# Patient Record
Sex: Male | Born: 1970 | Race: White | Hispanic: No | Smoking: Former smoker
Health system: Southern US, Community
[De-identification: ages and names within clinical notes are randomized; demographics above are authoritative.]

## PROBLEM LIST (undated history)

## (undated) DIAGNOSIS — K219 Gastro-esophageal reflux disease without esophagitis: Secondary | ICD-10-CM

## (undated) DIAGNOSIS — C801 Malignant (primary) neoplasm, unspecified: Secondary | ICD-10-CM

## (undated) DIAGNOSIS — Z978 Presence of other specified devices: Secondary | ICD-10-CM

## (undated) DIAGNOSIS — L0291 Cutaneous abscess, unspecified: Secondary | ICD-10-CM

## (undated) HISTORY — PX: ABDOMINAL SURGERY: SHX537

## (undated) HISTORY — PX: OTHER SURGICAL HISTORY: SHX169

## (undated) HISTORY — DX: Gastro-esophageal reflux disease without esophagitis: K21.9

## (undated) HISTORY — PX: JEJUNOSTOMY FEEDING TUBE: SUR737

## (undated) HISTORY — PX: TRACHEOESOPHAGEAL FISTULA REPAIR: SHX2557

---

## 2002-02-27 ENCOUNTER — Ambulatory Visit (HOSPITAL_COMMUNITY): Admission: RE | Admit: 2002-02-27 | Discharge: 2002-02-28 | Payer: Self-pay

## 2004-09-20 ENCOUNTER — Emergency Department (HOSPITAL_COMMUNITY): Admission: EM | Admit: 2004-09-20 | Discharge: 2004-09-20 | Payer: Self-pay | Admitting: Emergency Medicine

## 2004-09-29 ENCOUNTER — Emergency Department (HOSPITAL_COMMUNITY): Admission: EM | Admit: 2004-09-29 | Discharge: 2004-09-29 | Payer: Self-pay | Admitting: Emergency Medicine

## 2004-09-30 ENCOUNTER — Emergency Department (HOSPITAL_COMMUNITY): Admission: EM | Admit: 2004-09-30 | Discharge: 2004-09-30 | Payer: Self-pay | Admitting: Emergency Medicine

## 2005-09-30 ENCOUNTER — Ambulatory Visit: Payer: Self-pay | Admitting: Internal Medicine

## 2005-10-05 ENCOUNTER — Ambulatory Visit (HOSPITAL_COMMUNITY): Admission: RE | Admit: 2005-10-05 | Discharge: 2005-10-05 | Payer: Self-pay | Admitting: Internal Medicine

## 2005-10-12 ENCOUNTER — Ambulatory Visit (HOSPITAL_COMMUNITY): Admission: RE | Admit: 2005-10-12 | Discharge: 2005-10-12 | Payer: Self-pay | Admitting: Internal Medicine

## 2005-11-04 ENCOUNTER — Ambulatory Visit: Payer: Self-pay | Admitting: Internal Medicine

## 2005-11-05 ENCOUNTER — Ambulatory Visit (HOSPITAL_COMMUNITY): Admission: RE | Admit: 2005-11-05 | Discharge: 2005-11-05 | Payer: Self-pay | Admitting: Internal Medicine

## 2005-11-19 ENCOUNTER — Emergency Department (HOSPITAL_COMMUNITY): Admission: EM | Admit: 2005-11-19 | Discharge: 2005-11-20 | Payer: Self-pay | Admitting: Emergency Medicine

## 2005-12-02 ENCOUNTER — Ambulatory Visit (HOSPITAL_COMMUNITY): Admission: RE | Admit: 2005-12-02 | Discharge: 2005-12-02 | Payer: Self-pay | Admitting: Internal Medicine

## 2005-12-31 IMAGING — US US ABDOMEN COMPLETE
1 series · 13 of 25 positions shown · non-contrast
Comparison: none

CLINICAL DATA: Hepatitis.
 ABDOMEN ULTRASOUND ? 10/05/05:
TECHNIQUE: Complete abdominal ultrasound examination was performed including evaluation of the liver, gallbladder, bile ducts, pancreas, kidneys, spleen, IVC, and abdominal aorta.

[Series 1: unknown · 0.33mm/px · 13 of 75 slices shown]
[im 1/75]
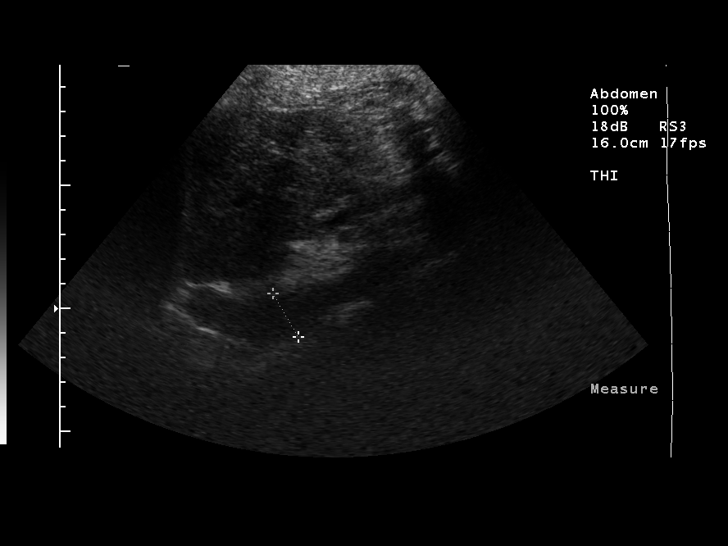
[im 7/75]
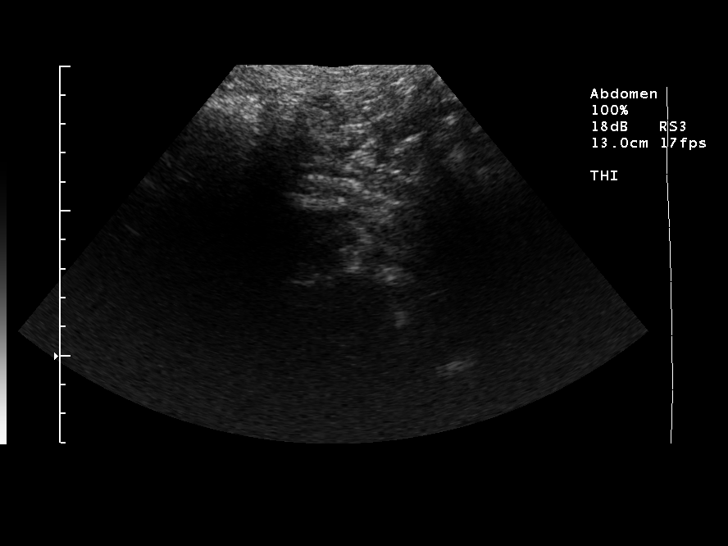
[im 13/75]
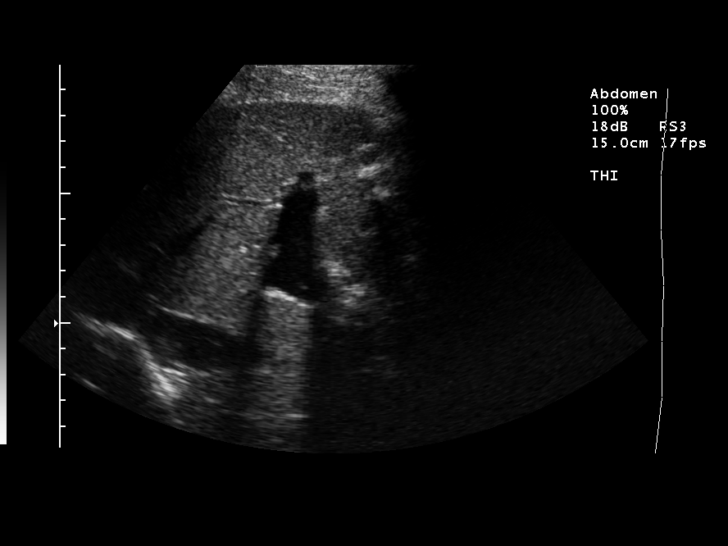
[im 19/75]
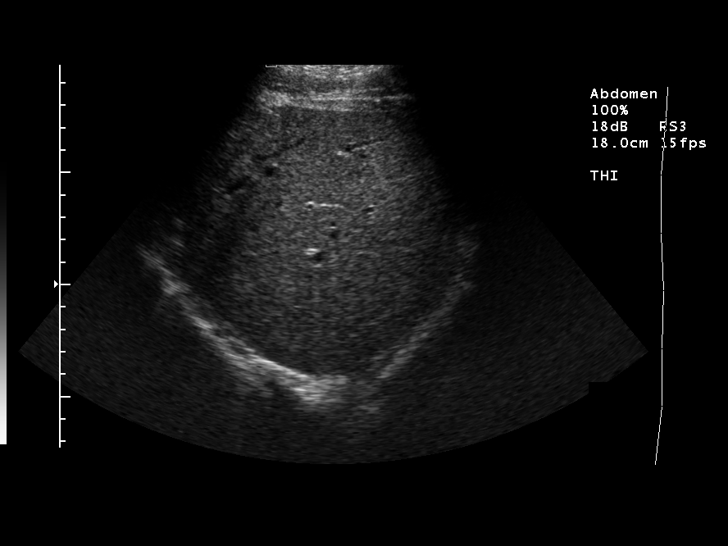
[im 25/75]
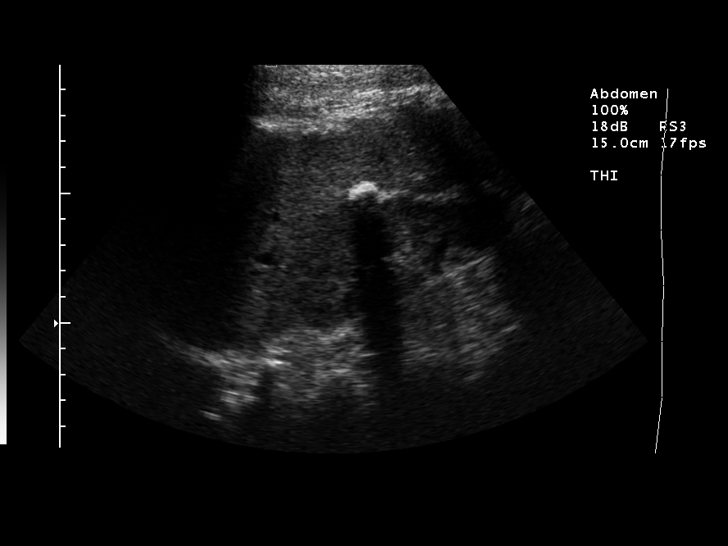
[im 31/75]
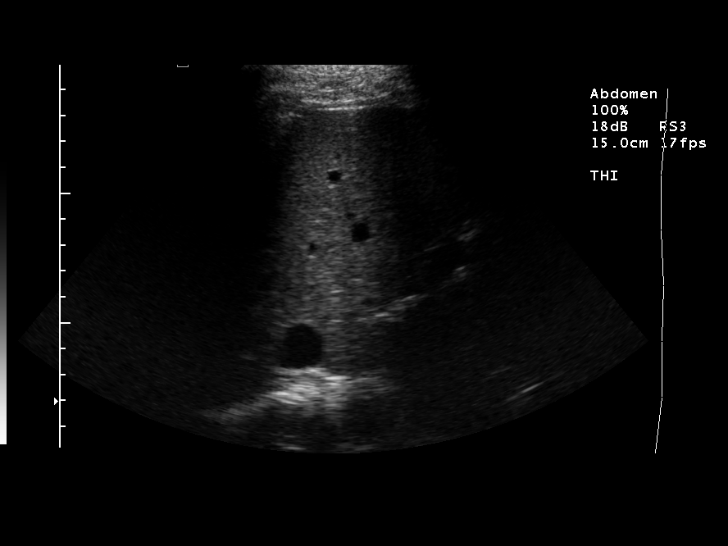
[im 38/75]
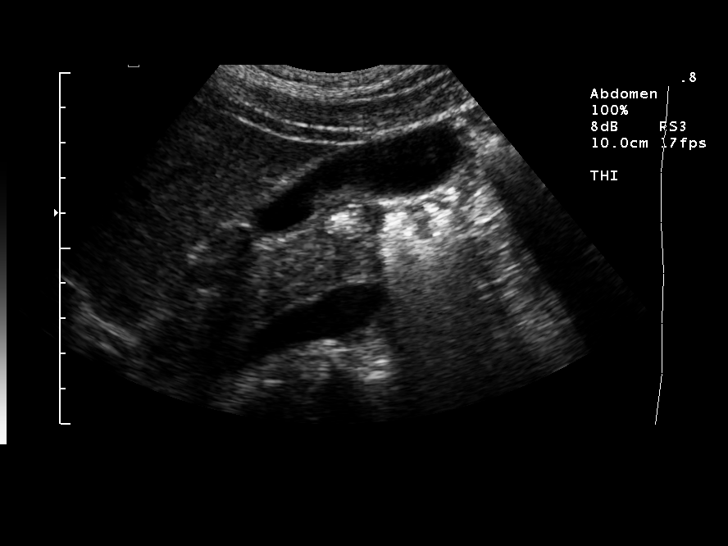
[im 44/75]
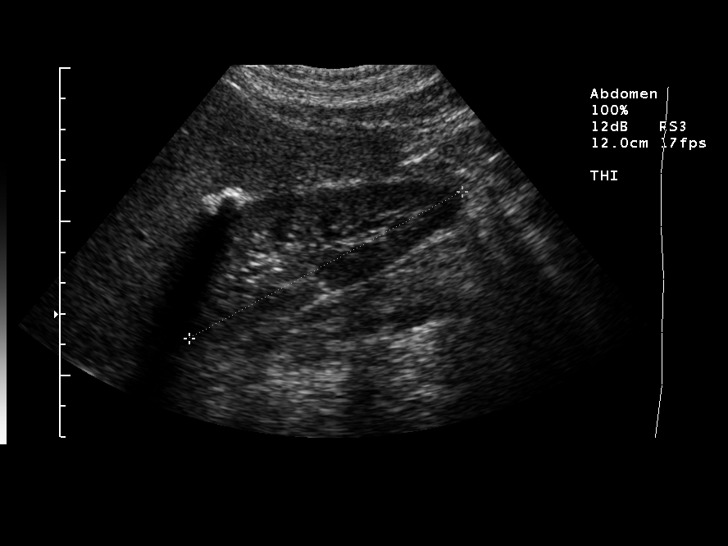
[im 50/75]
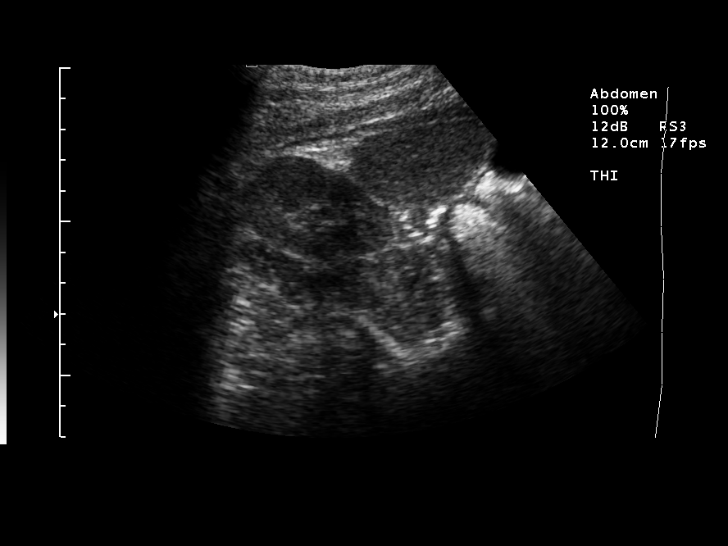
[im 56/75]
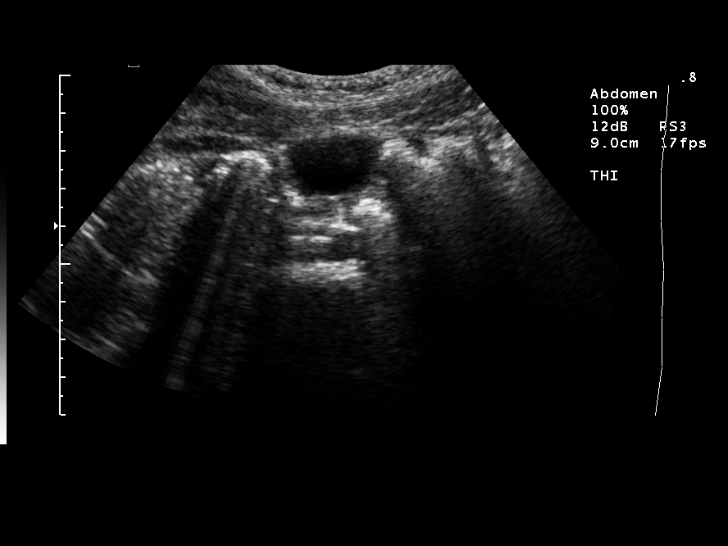
[im 62/75]
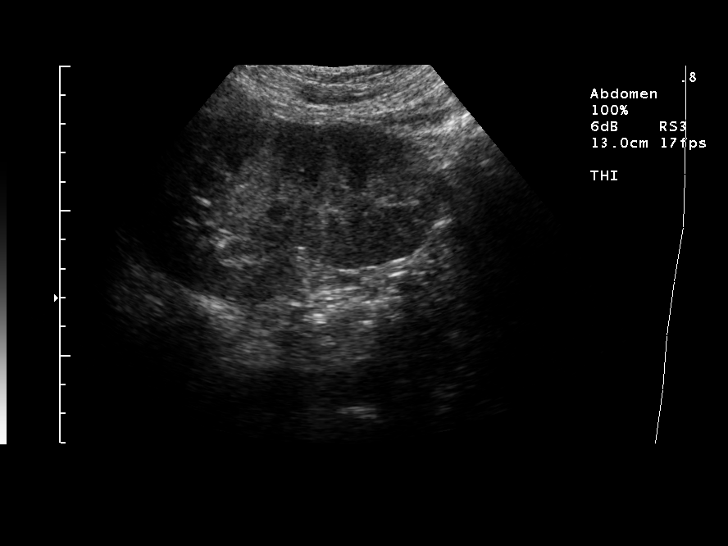
[im 68/75]
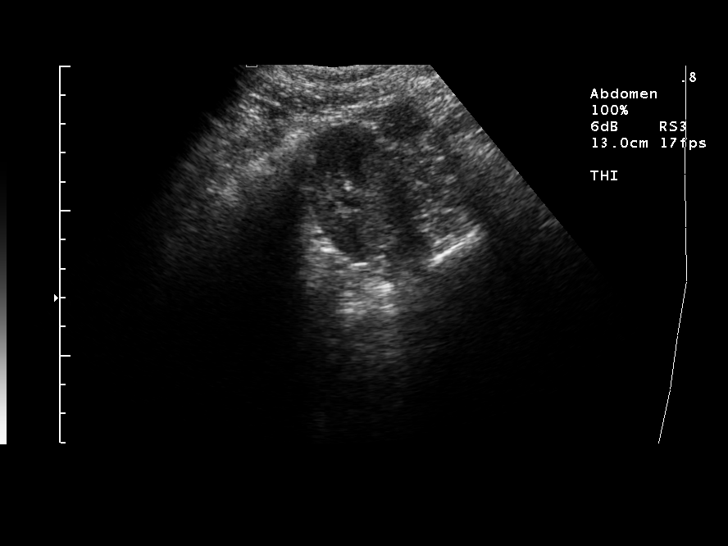
[im 75/75]
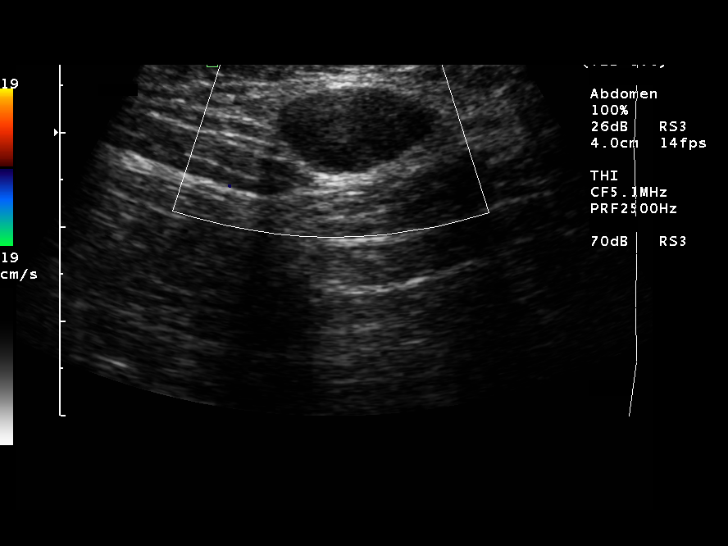

[13 of 25 positions shown; findings below may reference images not displayed]

FINDINGS: The liver is homogeneous in echotexture without intra- or extrahepatic ductal dilatation.  There is an approximately 1.4 cm area of shadowing echogenicity in the right hepatic lobe inferiorly which appears to abut the right kidney.  No intra- or extrahepatic ductal dilatation is seen.  The right kidney measures 10 cm and the left kidney measures 10.7 cm.  No hydronephrosis or focal mass is seen.  The spleen and gallbladder are unremarkable.  The pancreas is not well visualized due to overlying bowel gas.  The intrahepatic IVC and abdominal aorta are normal.  No ascites is seen.  At the time of the examination, the patient indicated a palpable area over the posterior left flank.  In the area of the questioned palpable finding is a 1.9 x 1.4 x 1.0 cm oval hypoechoic mass without internal flow, located approximately 0.5 cm from the skin surface.
IMPRESSION: 1.  Right hepatic lobe calcification, incompletely evaluated on this modality.  Liver mass protocol CT before and after administration of IV contrast is recommended for further characterization.  
 2.  1.9 x 1.4 x 1.0 cm hypoechoic oval mass located 0.5 cm from the skin surface over the posterior left flank, as indicated by the patient as a palpable mass.  This may represent a sebaceous cyst or other benign etiology.  This area could also be included on abdominal CT and be further characterized at that time.
 Recommendations will be called to Dr. Rebecca?[REDACTED] by Radiology staff at the conclusion of this dictation.

## 2006-01-07 IMAGING — CT CT ABDOMEN WO/W CM
3 of 6 series · 11 of 42 positions shown, 17 images · IV contrast (CONTRAST)
Comparison: none

CLINICAL DATA: Hepatomegaly.  Hepatitis.  Liver mass protocol due to calcification lesion seen on recent abdominal ultrasound.  
ABDOMEN CT WITHOUT AND WITH CONTRAST:
TECHNIQUE: Multidetector CT imaging of the abdomen was performed both before and during bolus administration of intravenous contrast.
Contrast:   cc Omnipaque 300

[Series 4342: — · axial · 0.66mm/px · 1 of 49 slices shown (1 of 3)]
[im 9/49  soft-tissue]
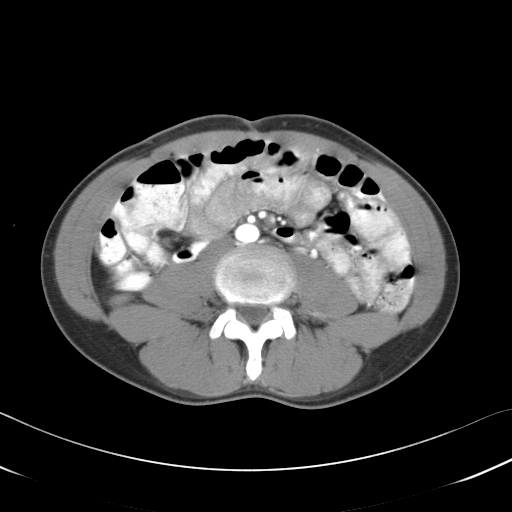

[Series 4343: — · axial · 0.68mm/px · z∈[+1328,+1568]mm · 7 of 65 slices shown, 12 images (2 of 3)]
[im 9/65  soft-tissue]
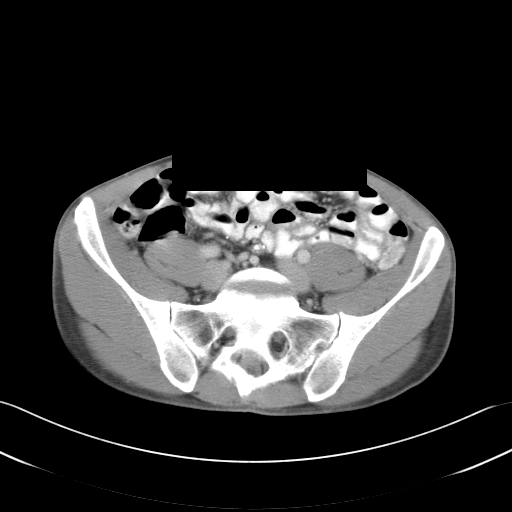
[im 9/65  bone]
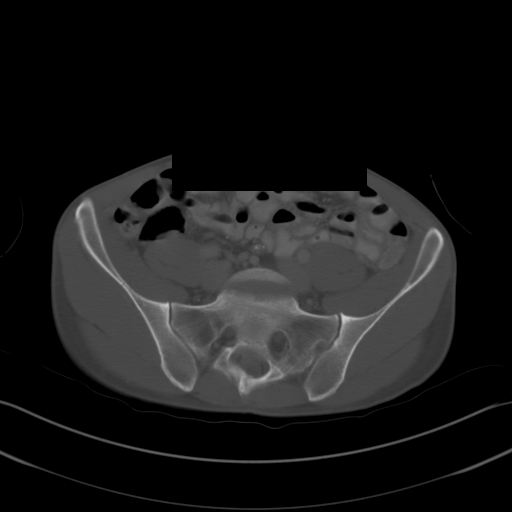
[im 17/65  soft-tissue]
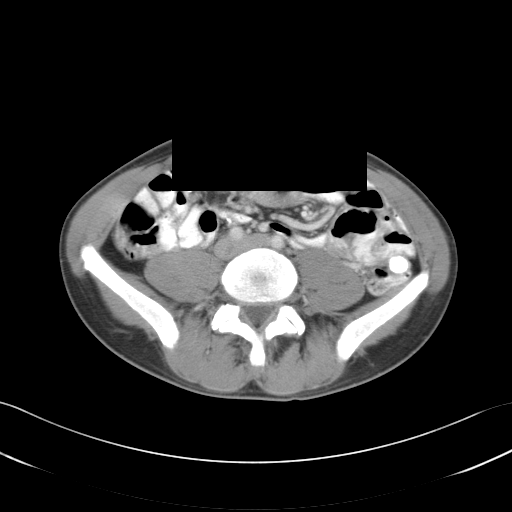
[im 25/65  soft-tissue]
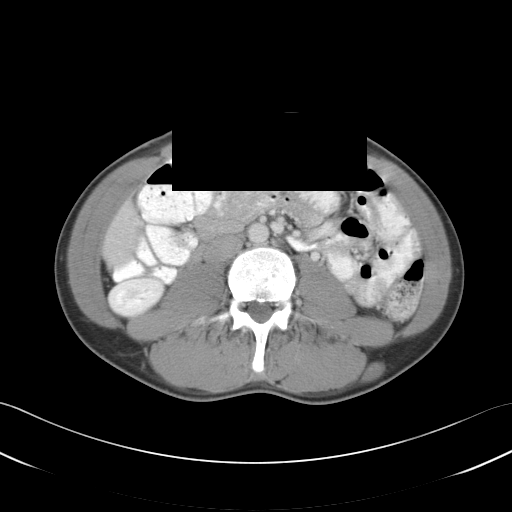
[im 33/65  soft-tissue]
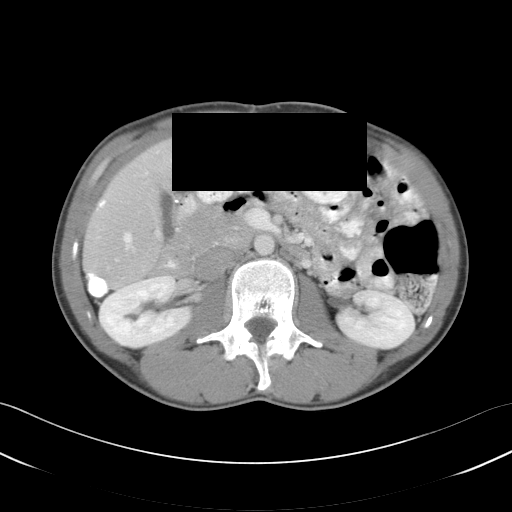
[im 33/65  lung]
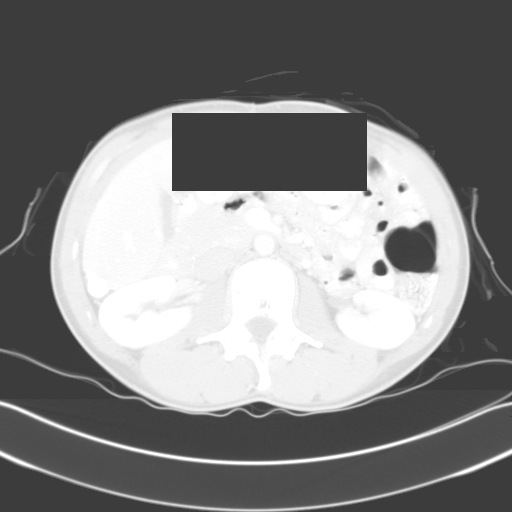
[im 41/65  soft-tissue]
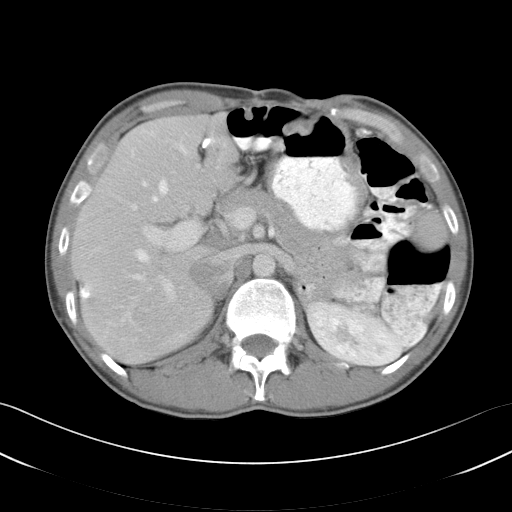
[im 41/65  lung]
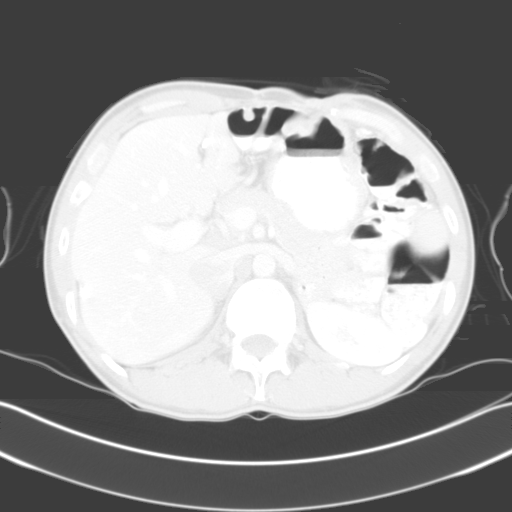
[im 49/65  soft-tissue]
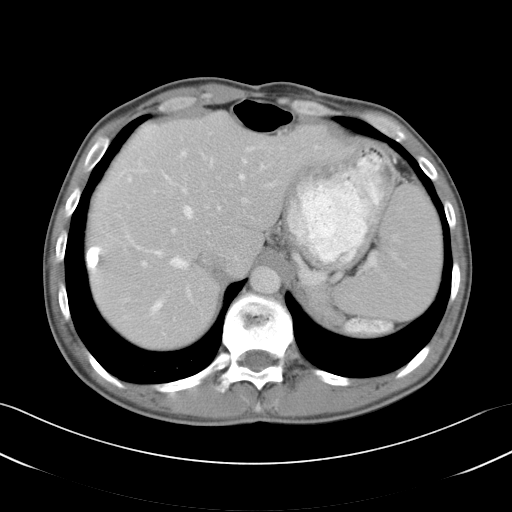
[im 49/65  lung]
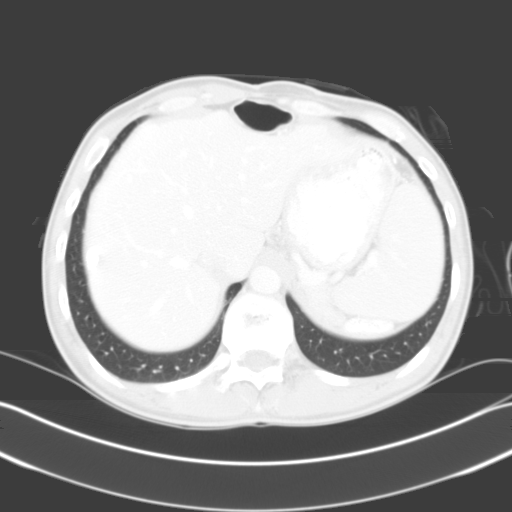
[im 57/65  soft-tissue]
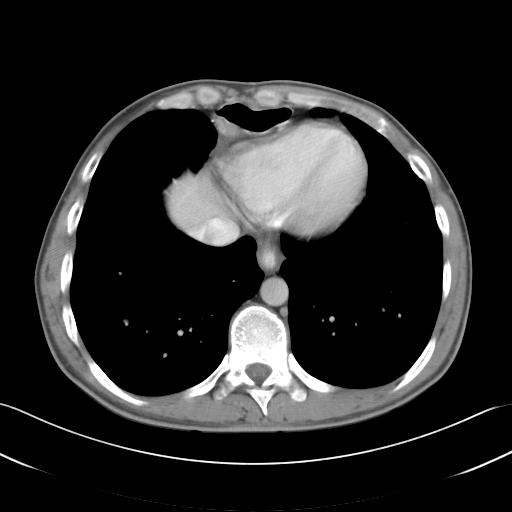
[im 57/65  lung]
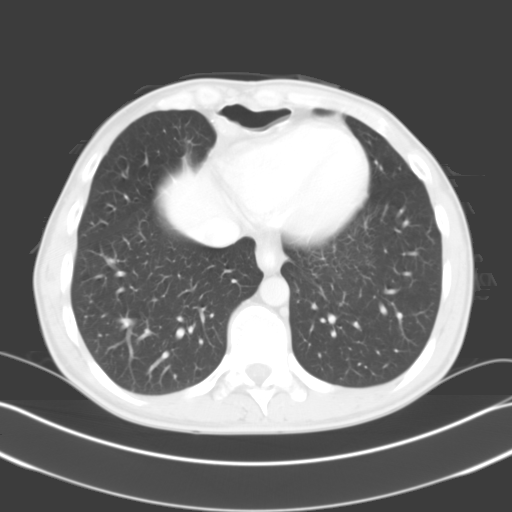

[— · coronal · 0.66mm/px · 3 of 100 slices shown, 4 images (3 of 3)]
[im 34/100  soft-tissue]
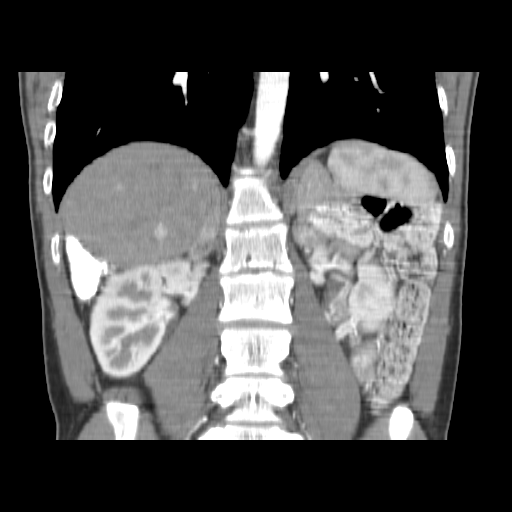
[im 45/100  soft-tissue]
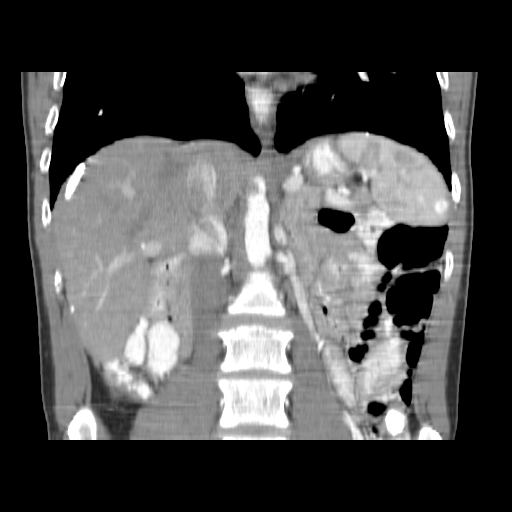
[im 45/100  bone]
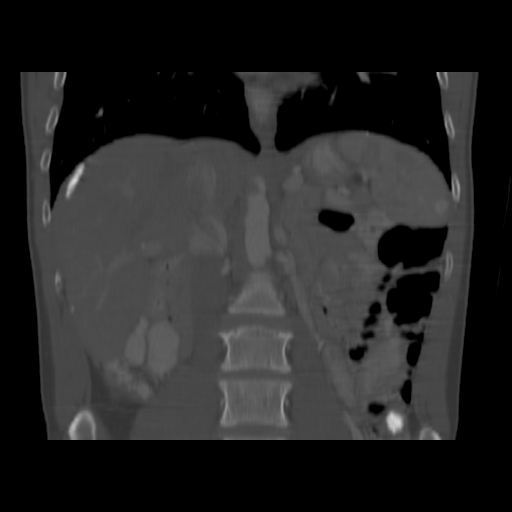
[im 56/100  soft-tissue]
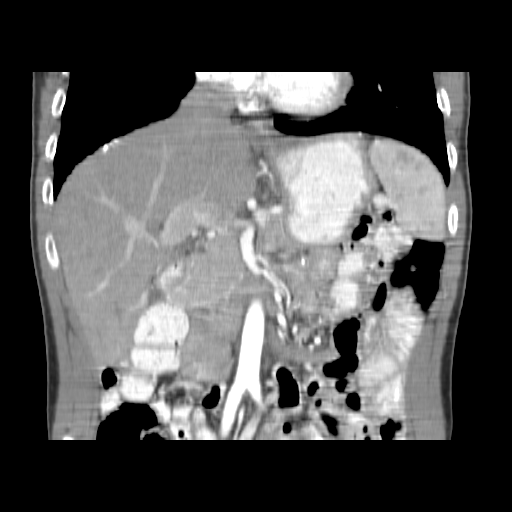

[11 of 42 positions shown; findings below may reference images not displayed]

FINDINGS: An anterior diaphragmatic hernia is present with transverse colon extending up into the anterior mediastinum.  This is only partially characterized on today?s exam, but on the scout view the appearance of the anterior mediastinal colonic hernia is rather prominent.  Dedicated chest CT is may be warranted for complete evaluation. 
There is a small hiatal hernia.  In addition, we demonstrate abnormal markedly dense scattered foci of density ranging up to 0000 Hounsfield units along the edge of the liver and spleen as well as scattered in the mesentery.  These very dense foci along the mesentery are likely outside of the bowel.  
I believe that the dominant capsular lesion along the liver which measures 2.3 X 1.4 cm corresponds to the lesion seen sonographically.  Given the rest of the findings in the abdomen I doubt that this represents an actual liver mass but rather a large dense capsular calcification.  No pancreatic mass is identified.  
Cephalad in the spleen there is a 1 cm enhancing lesion which demonstrates slight hypodensity on precontrast imaging, as well as arterial phase enhancement and mild delayed enhancement.  This may represent an atypical hemangioma but is technically nonspecific.  There is also some faint opacification peripherally and inferiorly in the spleen measuring 9 mm in diameter on image 21 of series 0909, likewise technically nonspecific.  
Both kidneys appear unremarkable.  The technologist scanned down into a part of the pelvic on series 0909 and includes much of the pelvis in this CT of the abdomen, and also demonstrates some calcifications along the right external iliac nodal chain.  On image 41 of series of 0909 we demonstrate a left sided subcutaneous soft tissue lesion measuring 12 mm in short axis at the lateral margin of the paraspinal musculature.  This likely represents a sebaceous cyst or other benign subcutaneous lesion.
IMPRESSION: 1.  Capsular high density foci along the liver and splen along with scattered mesenteric and nodal calcifications in the abdomen.  Top differential diagnostic considerations include prior barium peritonitis, prior TB peritonitis, or prior peritoneal dialysis with associated calcifications.  Similar calcifications were noted on a prior conventional radiograph of the abdomen from 02/27/02, and I doubt that this calcification represent a malignant process.  In females, ovarian carcinoma can cause a similar appearance, but given the patient's male gender this is not a consideration.
2.  There are two nonspecific splenic lesions which may need to be followed. 
3.  There is a large herniation of the transverse colon into the anterior mediastinum. 
4.  Small cystic appearing lesion in the left posterior flank is likely a sebaceous cyst.

## 2006-01-31 IMAGING — CT CT CHEST W/ CM
1 of 2 series · 14 of 32 positions shown, 18 images · IV contrast (CONTRAST)
Comparison: CT abdomen 10/12/05.

CLINICAL DATA: Colonic mediastinum on prior CT scan.  History of esophageal surgery as an infant.
CHEST CT WITH CONTRAST:
TECHNIQUE: Multidetector CT imaging of the chest was performed following the standard protocol during bolus administration of intravenous contrast.
Contrast:  100 cc Omnipaque 300.

[Series 6674: — · axial · 0.67mm/px · z∈[+1457,+1752]mm · 14 of 71 slices shown, 18 images]
[im 6/71  mediastinal]
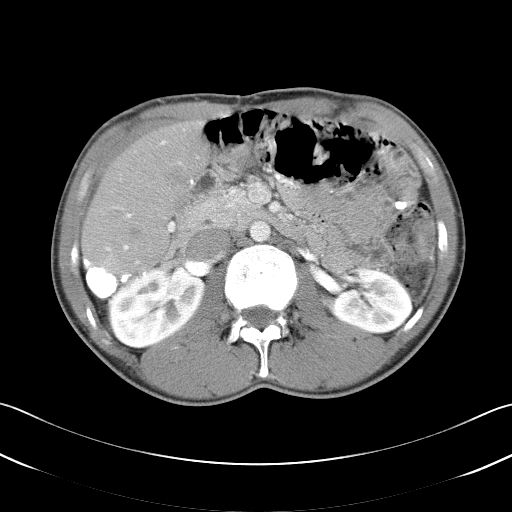
[im 6/71  lung]
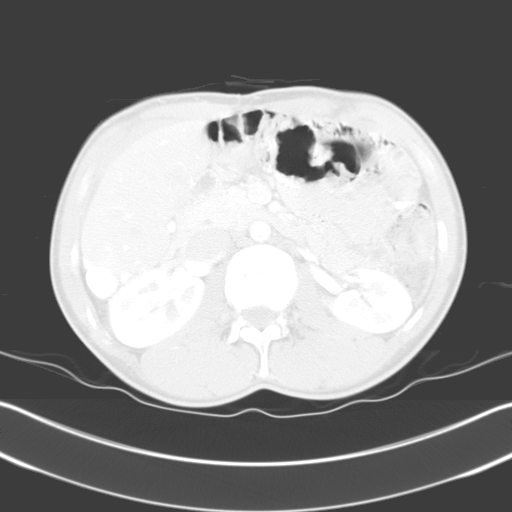
[im 11/71  lung]
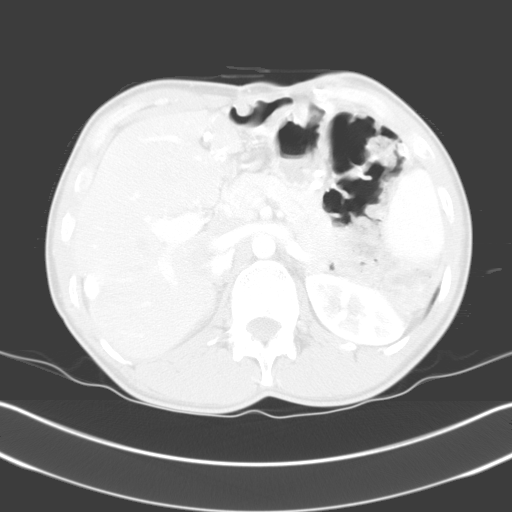
[im 17/71  lung]
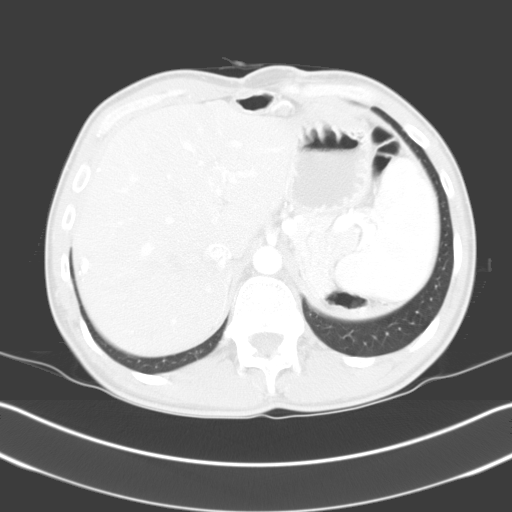
[im 22/71  lung]
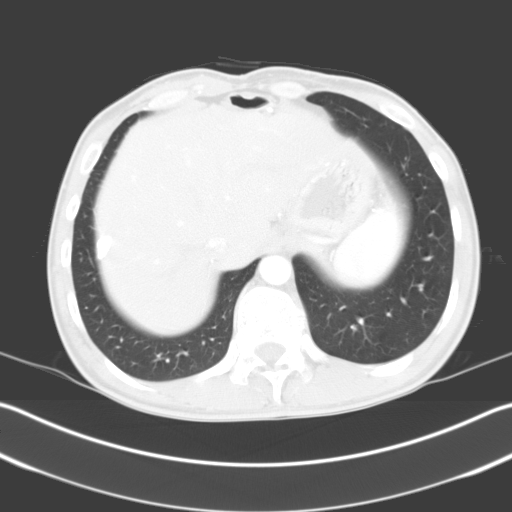
[im 27/71  mediastinal]
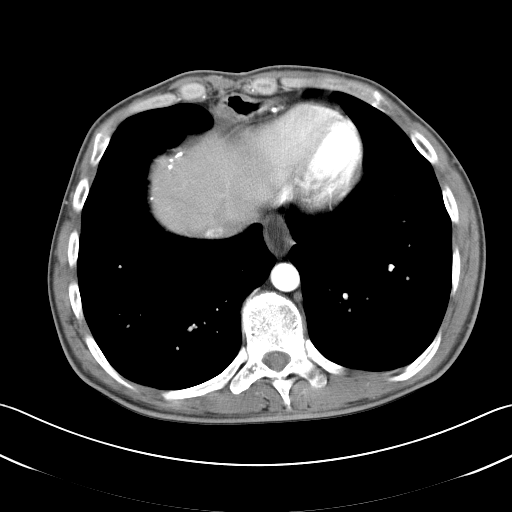
[im 27/71  lung]
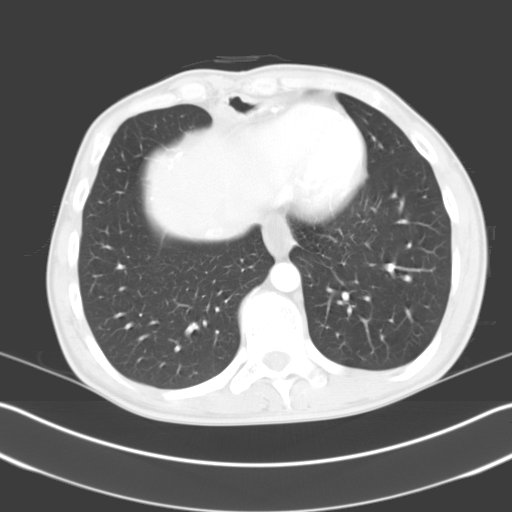
[im 33/71  lung]
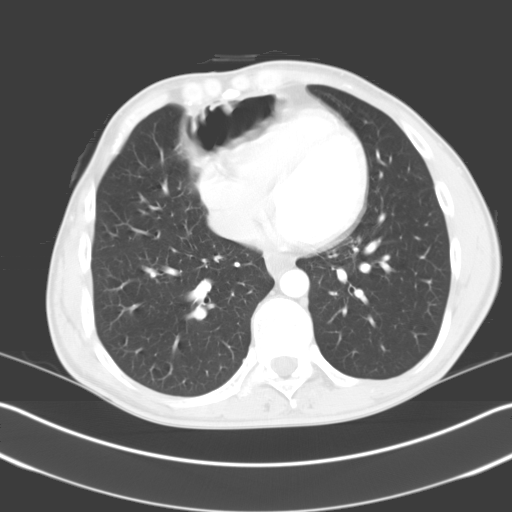
[im 34/71  lung]
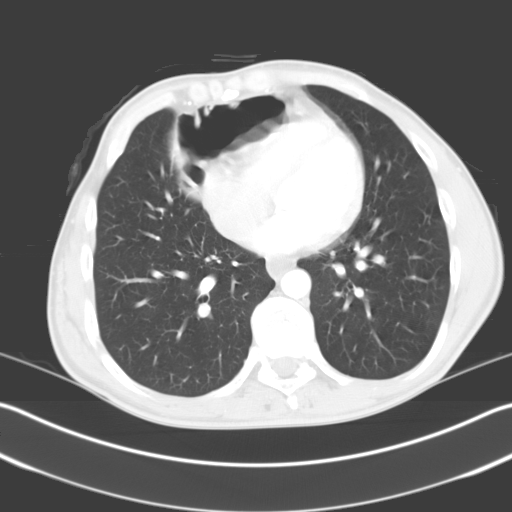
[im 36/71  lung]
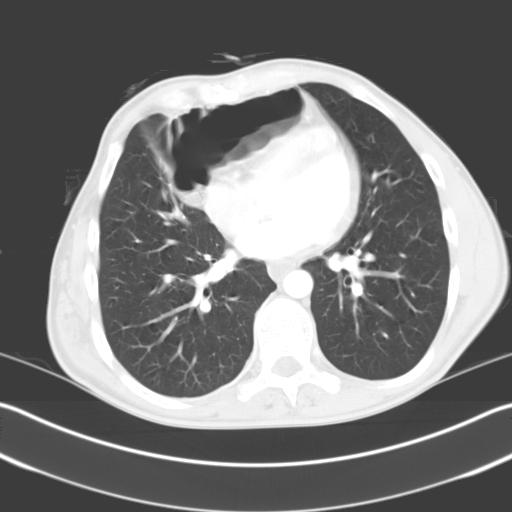
[im 38/71  mediastinal]
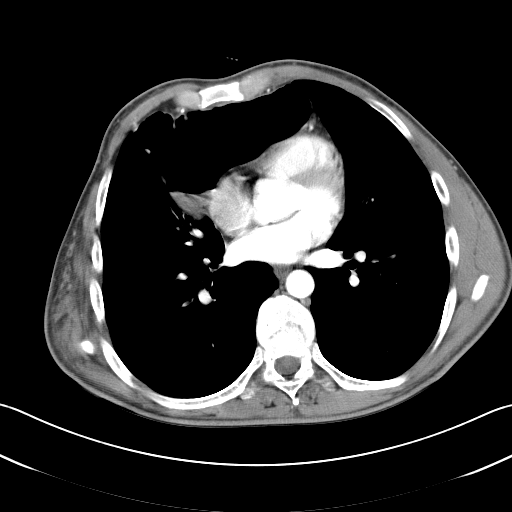
[im 38/71  lung]
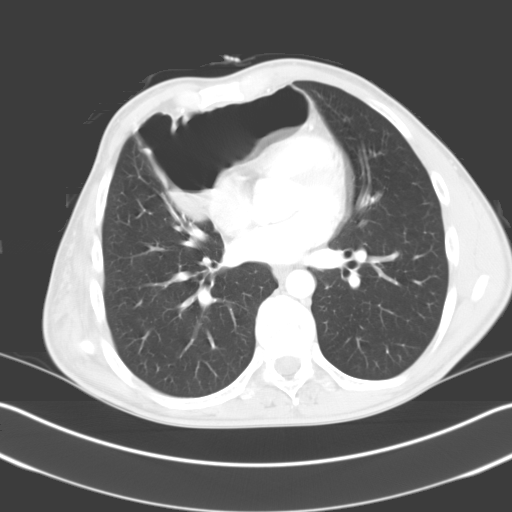
[im 44/71  lung]
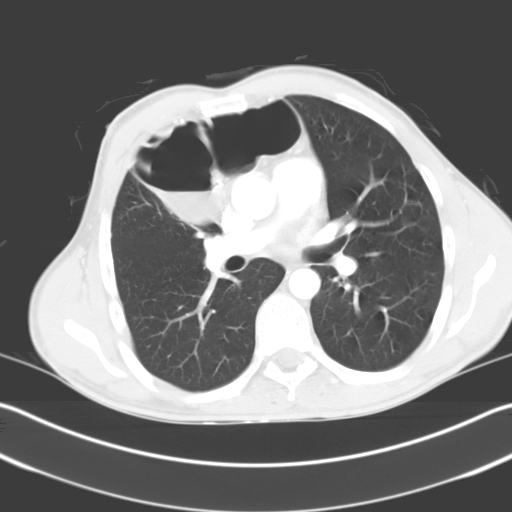
[im 49/71  lung]
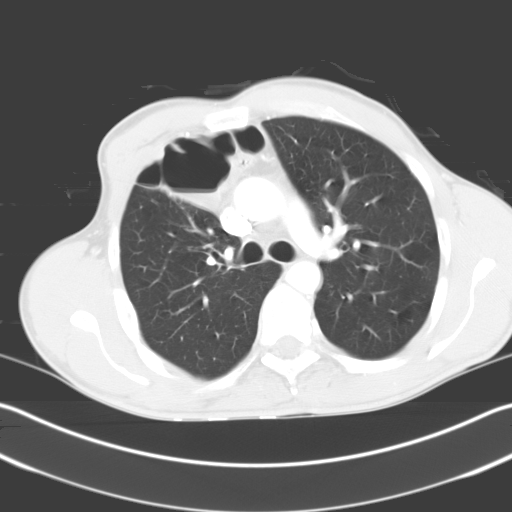
[im 54/71  lung]
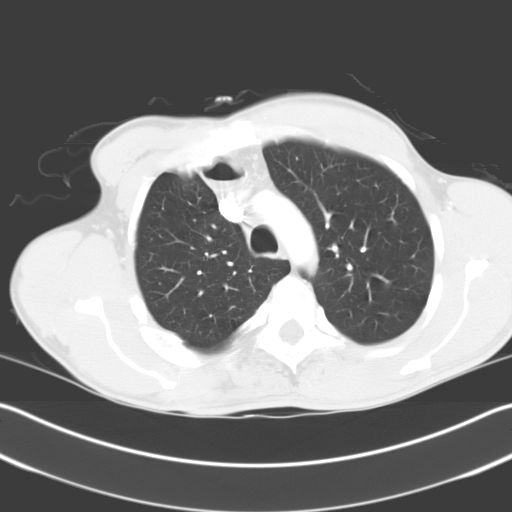
[im 60/71  mediastinal]
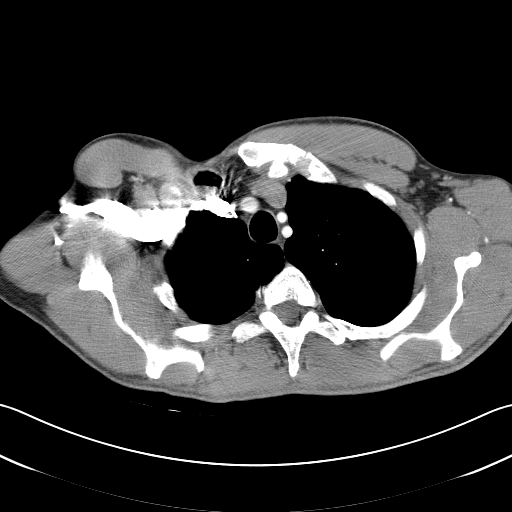
[im 60/71  lung]
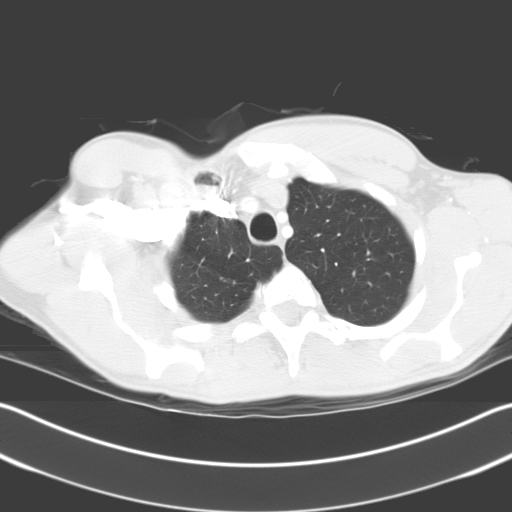
[im 65/71  lung]
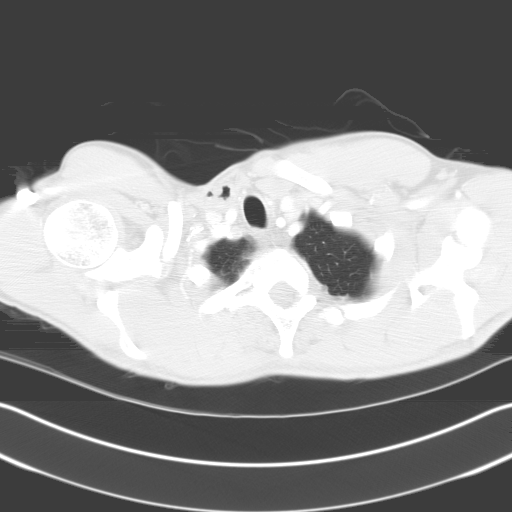

[14 of 32 positions shown; findings below may reference images not displayed]

FINDINGS: There is a colonic segment in the anterior mediastinum extending into the right neck but not appearing to communicate with the esophagus.  It does appear to communicate with the stomach at its distal end.   I think this is most likely a surgically placed colonic segment for esophageal atresia or other esophageal problem as an infant.  No surrounding edema or inflammation is seen.  There is no fluid collection and this does not appear to be inflamed.  There are some fluid levels within the colonic segment, which is redundant.  I see oral contrast in the esophagus on the prior CT and I assume that the patient uses his native esophagus at this point.  I did not give oral contrast on today?s study.  In addition there are some high density peritoneal implants in the abdomen which are presumably barium peritonitis related to extravasation of barium related to patient?s prior esophageal problem.  No barium is seen in the chest cavity.  There is no infiltrate or effusion.  No lung mass is seen and there is no adenopathy.  There are hypervascular splenic lesions measuring about 12 mm each as noted on the prior study.
IMPRESSION: There is a colonic segment in the anterior mediastinum, which appears to end in a blind pouch near the right thyroid gland.  This is presumably a surgically placed colonic segment for esophageal atresia as an infant.  There is some barium peritonitis, which is likely, chronic.  The chest is clear and there is no acute abnormality identified.

## 2006-02-27 IMAGING — RF DG ESOPHAGUS
12 of 17 series · 15 of 24 positions shown · non-contrast
Comparison: none

CLINICAL DATA: 34-year-old male with history of esophageal atresia at birth with colonic interposition.  Progressive dysphagia. 
DOUBLE-CONTRAST ESOPHAGRAM:

[Series 1: run · 1 of 3 slices shown (1 of 12)]
[im 1/3]
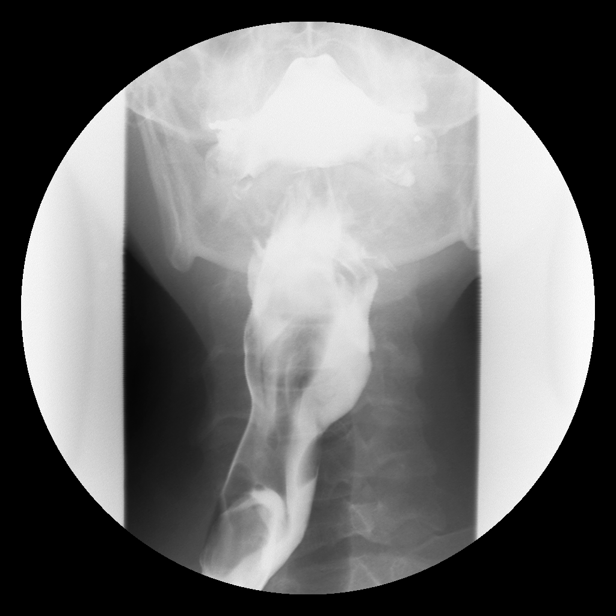

[Series 3: run · 3 of 13 slices shown (2 of 12)]
[im 4/13]
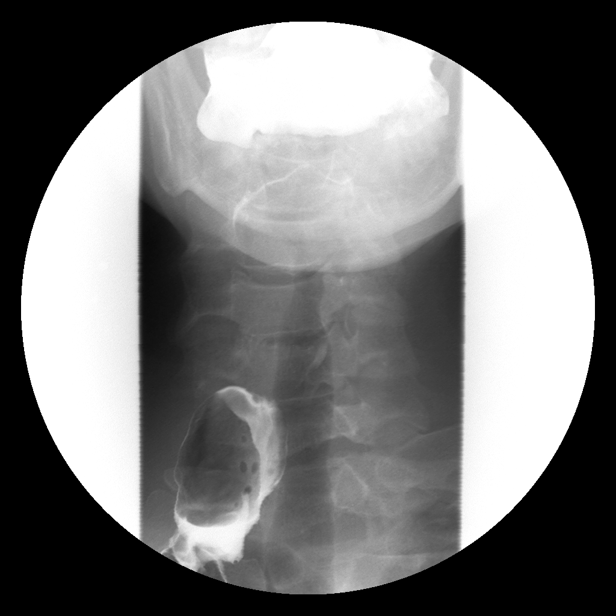
[im 10/13]
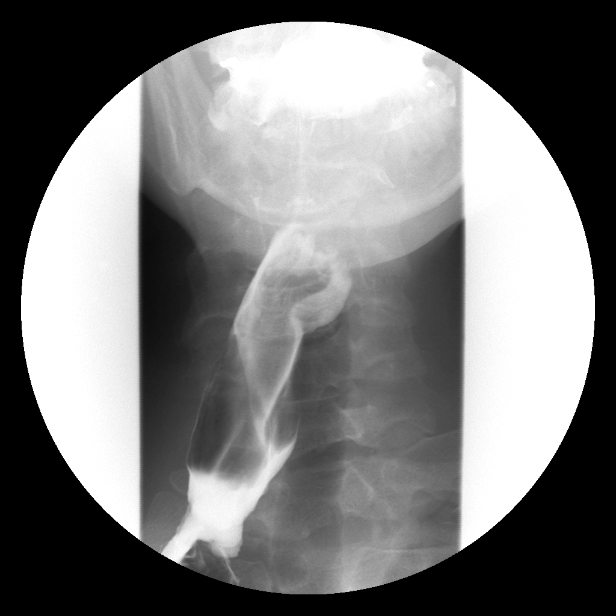
[im 13/13]
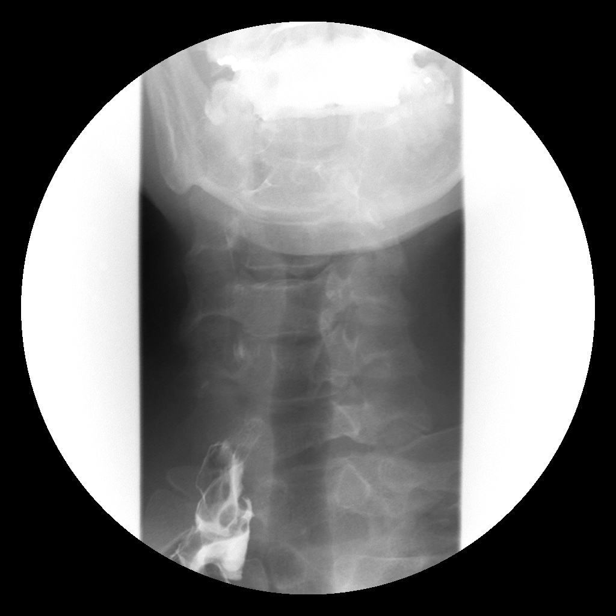

[Series 4: run · 2 of 10 slices shown (3 of 12)]
[im 4/10]
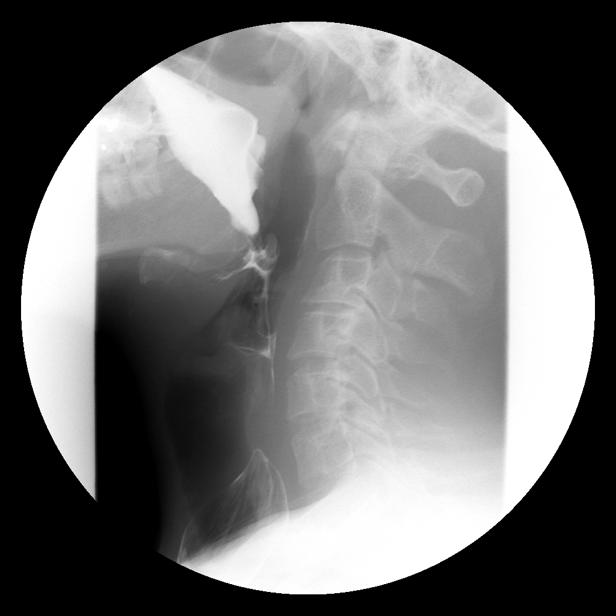
[im 7/10]
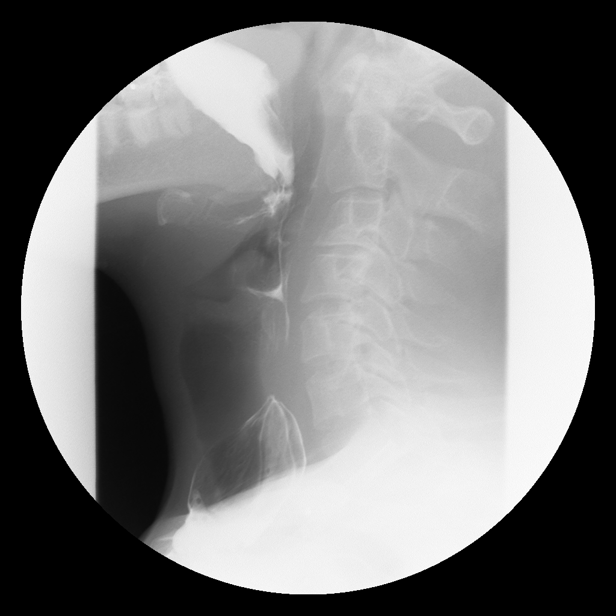

[Series 5: run · 1 of 1 slices shown (4 of 12)]
[im 1/1]
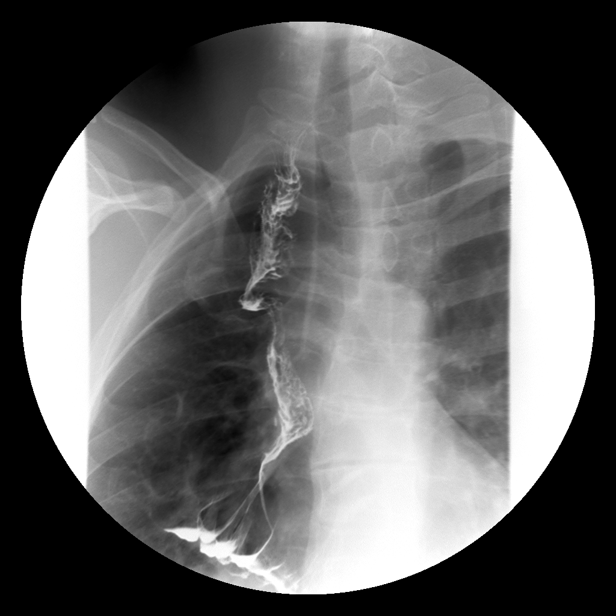

[Series 8: run · 1 of 1 slices shown (5 of 12)]
[im 1/1]
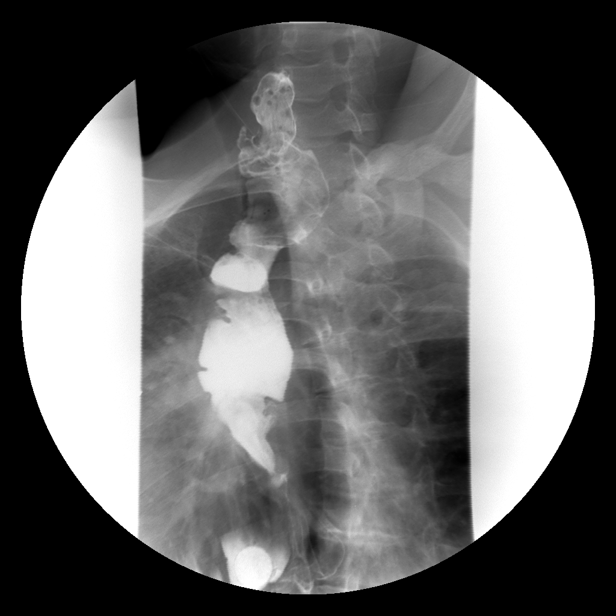

[Series 9: run · 1 of 1 slices shown (6 of 12)]
[im 1/1]
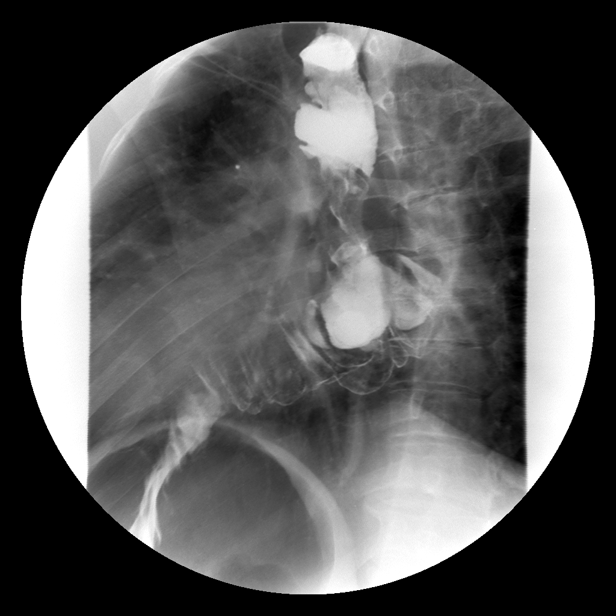

[Series 11: run · 1 of 1 slices shown (7 of 12)]
[im 1/1]
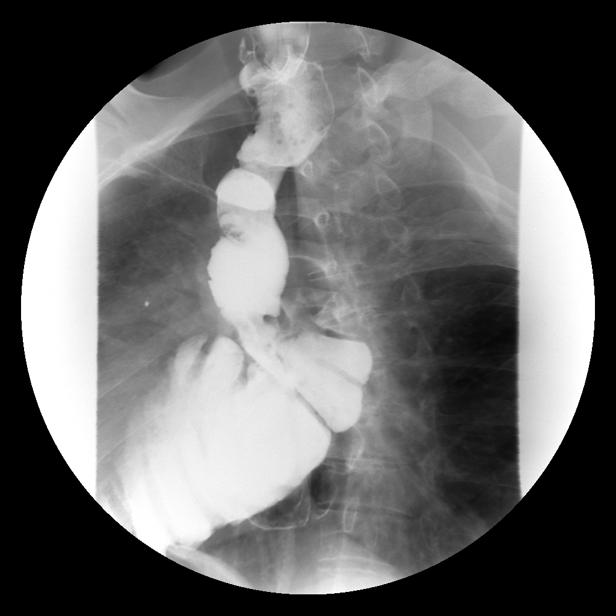

[Series 12: run · 1 of 1 slices shown (8 of 12)]
[im 1/1]
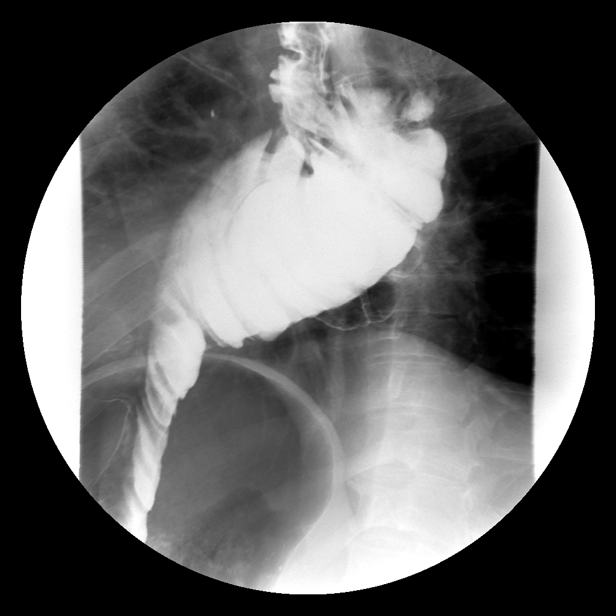

[Series 14: run · 1 of 1 slices shown (9 of 12)]
[im 1/1]
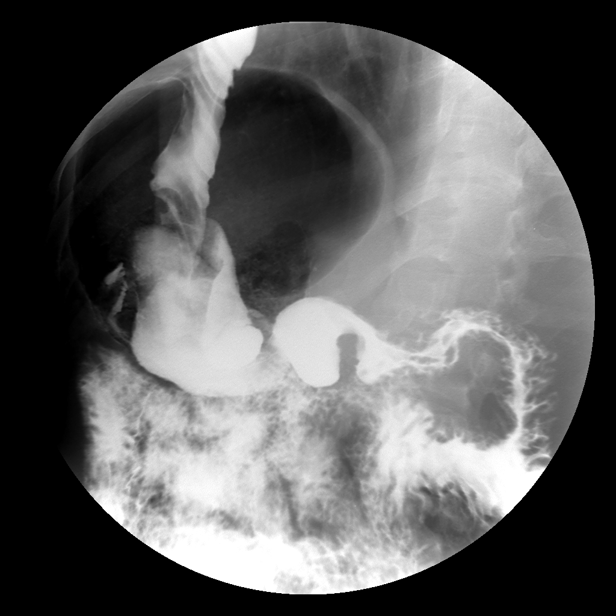

[Series 16: run · 1 of 1 slices shown (10 of 12)]
[im 1/1]
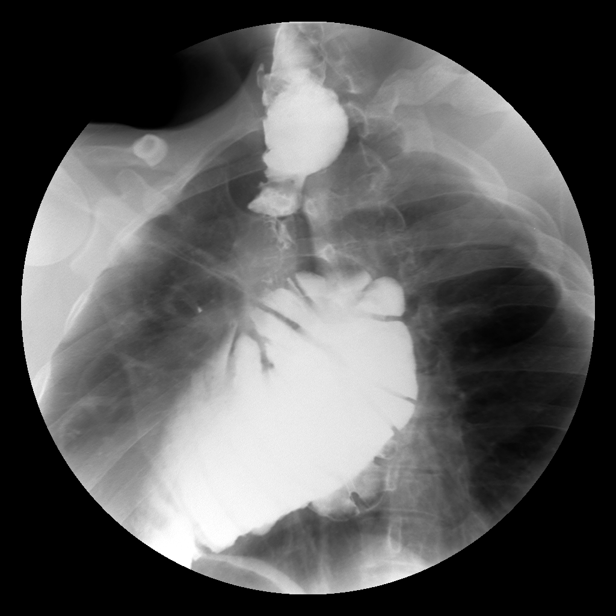

[Series 17: run · 1 of 1 slices shown (11 of 12)]
[im 1/1]
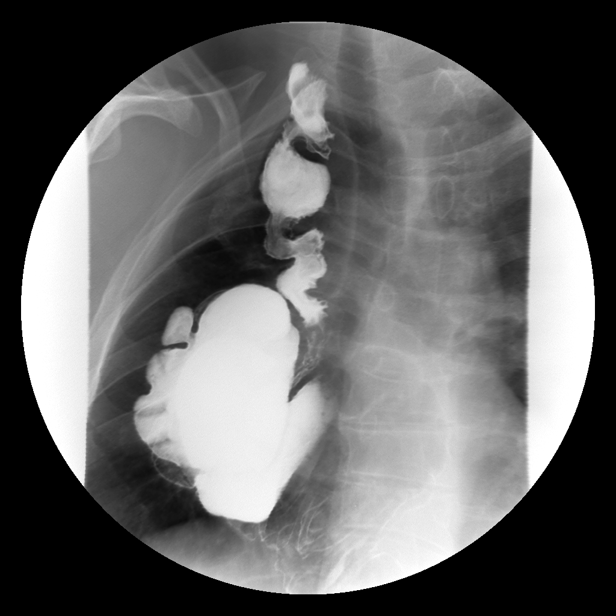

[Series 20: run · 1 of 1 slices shown (12 of 12)]
[im 1/1]
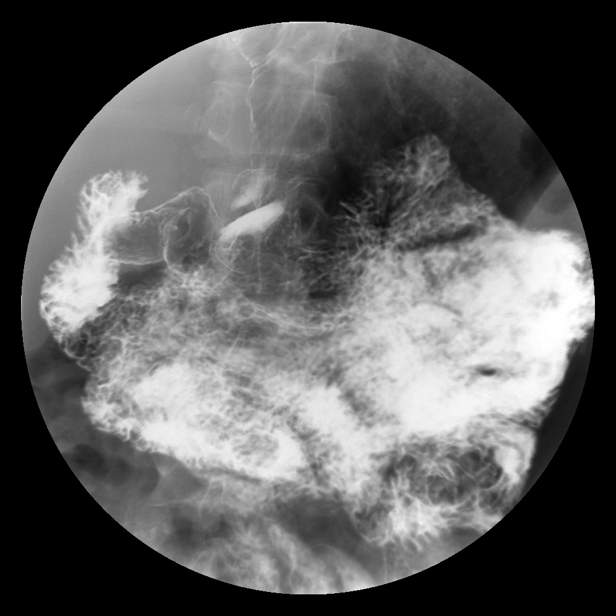

[15 of 24 positions shown; findings below may reference images not displayed]

FINDINGS: There is evidence of esophagectomy and colonic interposition. There is mild narrowing/stricture at the proximal anastomosis in the upper chest. There is no obstruction to contrast nor the barium tablet.  The pharynx is unremarkable.  There is no evidence of aspiration during the study.  Contrast flows freely into the distal stomach and throughout the small bowel without evidence of obstruction.
IMPRESSION: 1.  Evidence of esophagectomy and colonic interposition with mild narrowing at the proximal anastomosis - which does not obstruct the barium tablet or contrast. 
2.   No other significant abnormalities identified.

## 2006-08-22 ENCOUNTER — Emergency Department (HOSPITAL_COMMUNITY): Admission: EM | Admit: 2006-08-22 | Discharge: 2006-08-22 | Payer: Self-pay | Admitting: Emergency Medicine

## 2006-09-30 ENCOUNTER — Emergency Department (HOSPITAL_COMMUNITY): Admission: EM | Admit: 2006-09-30 | Discharge: 2006-09-30 | Payer: Self-pay | Admitting: Emergency Medicine

## 2006-11-17 IMAGING — CR DG CHEST 1V PORT
1 series · 1 of 1 positions shown · non-contrast
Comparison: none

CLINICAL DATA: Chest pain.  Prior gastric interposition.  Congenital esophageal atresia.  
 PORTABLE CHEST - 1 VIEW 08/22/06:

[view not recorded]
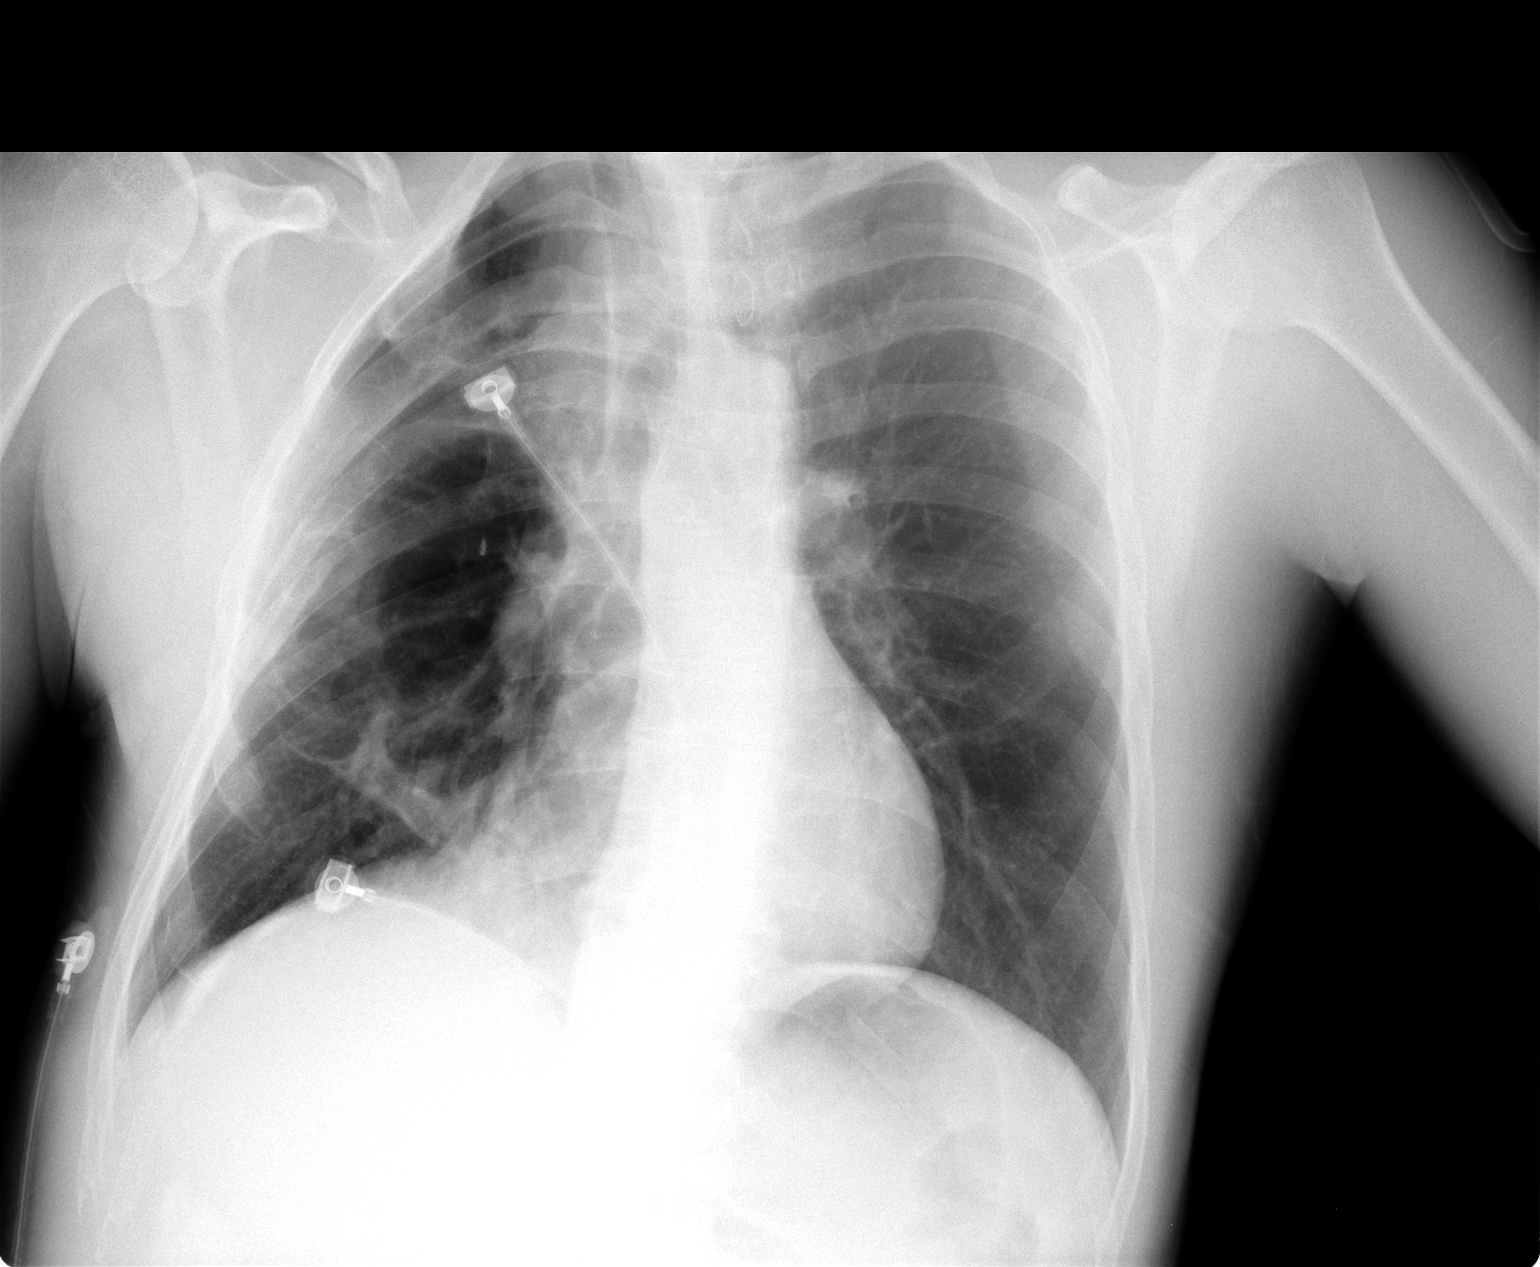

[1 of 1 positions shown; findings below may reference images not displayed]

FINDINGS: Heart size and vascularity are normal.  Left lung is clear.  There is chronic irregularity of the appearance of the right hemithorax due to the colonic interposition on the right.  I see no evidence of an acute infiltrate or effusion.  No acute bony abnormality.  Degenerative changes in the thoracic spine at the T10-11 level are stable.
IMPRESSION: No acute abnormality.

## 2006-12-26 IMAGING — CR DG CHEST 1V
1 series · 1 of 1 positions shown · non-contrast
Comparison: 08/22/06.

CLINICAL DATA: Chest pain.  Back pain.  Arm pain. 
 CHEST ? 1 VIEW:

[view not recorded]
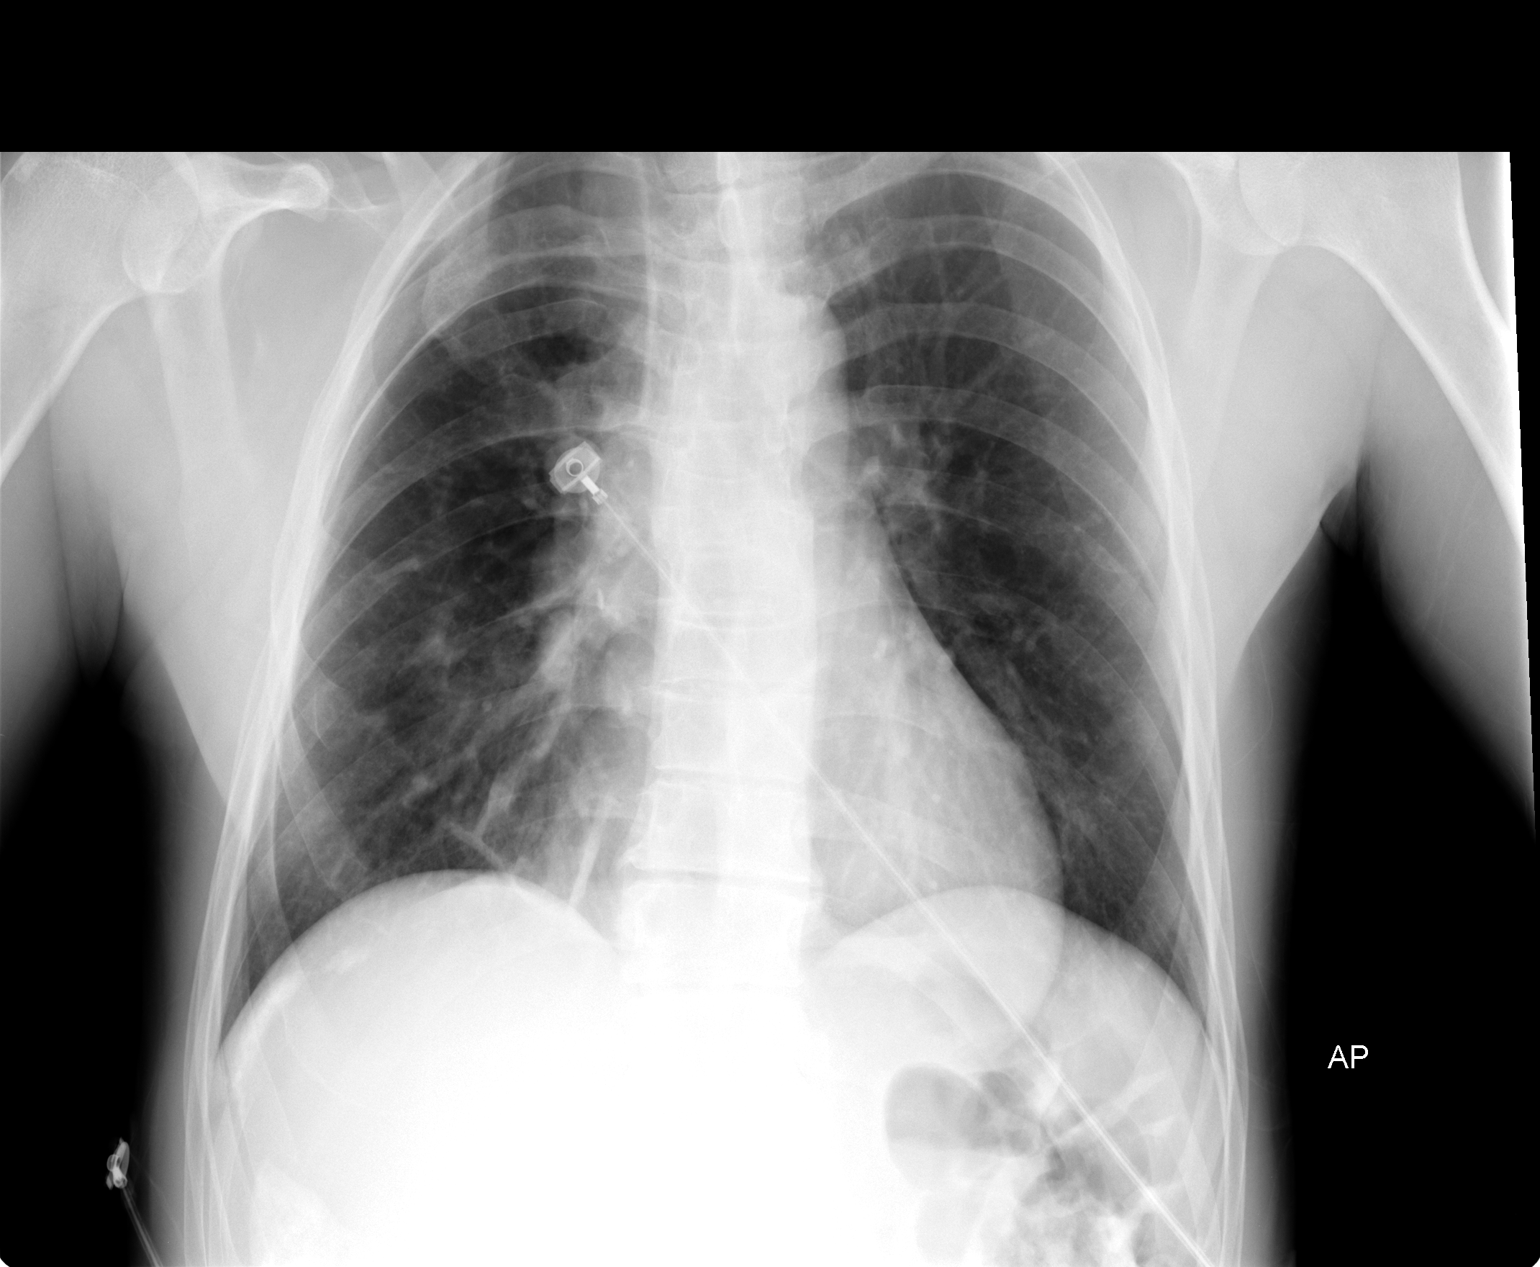

[1 of 1 positions shown; findings below may reference images not displayed]

FINDINGS: Postop changes are present with colon present in the anterior mediastinum as an esophageal conduit.  There is some scarring in the right lung, which is similar to the prior study.  There has been prior thoracotomy on the right.  The left lung is clear, and there is no acute abnormality.  No interval change from the prior study.
IMPRESSION: No acute findings.  No interval change.

## 2007-01-27 ENCOUNTER — Emergency Department (HOSPITAL_COMMUNITY): Admission: EM | Admit: 2007-01-27 | Discharge: 2007-01-27 | Payer: Self-pay | Admitting: Emergency Medicine

## 2007-04-03 ENCOUNTER — Emergency Department (HOSPITAL_COMMUNITY): Admission: EM | Admit: 2007-04-03 | Discharge: 2007-04-03 | Payer: Self-pay | Admitting: Emergency Medicine

## 2007-04-24 IMAGING — CR DG CHEST 2V
2 series · 2 of 2 positions shown · non-contrast
Comparison: 09/30/06.

CLINICAL DATA: Cough/fever. 
 CHEST ? 2 VIEW:

[view not recorded (1 of 2)]
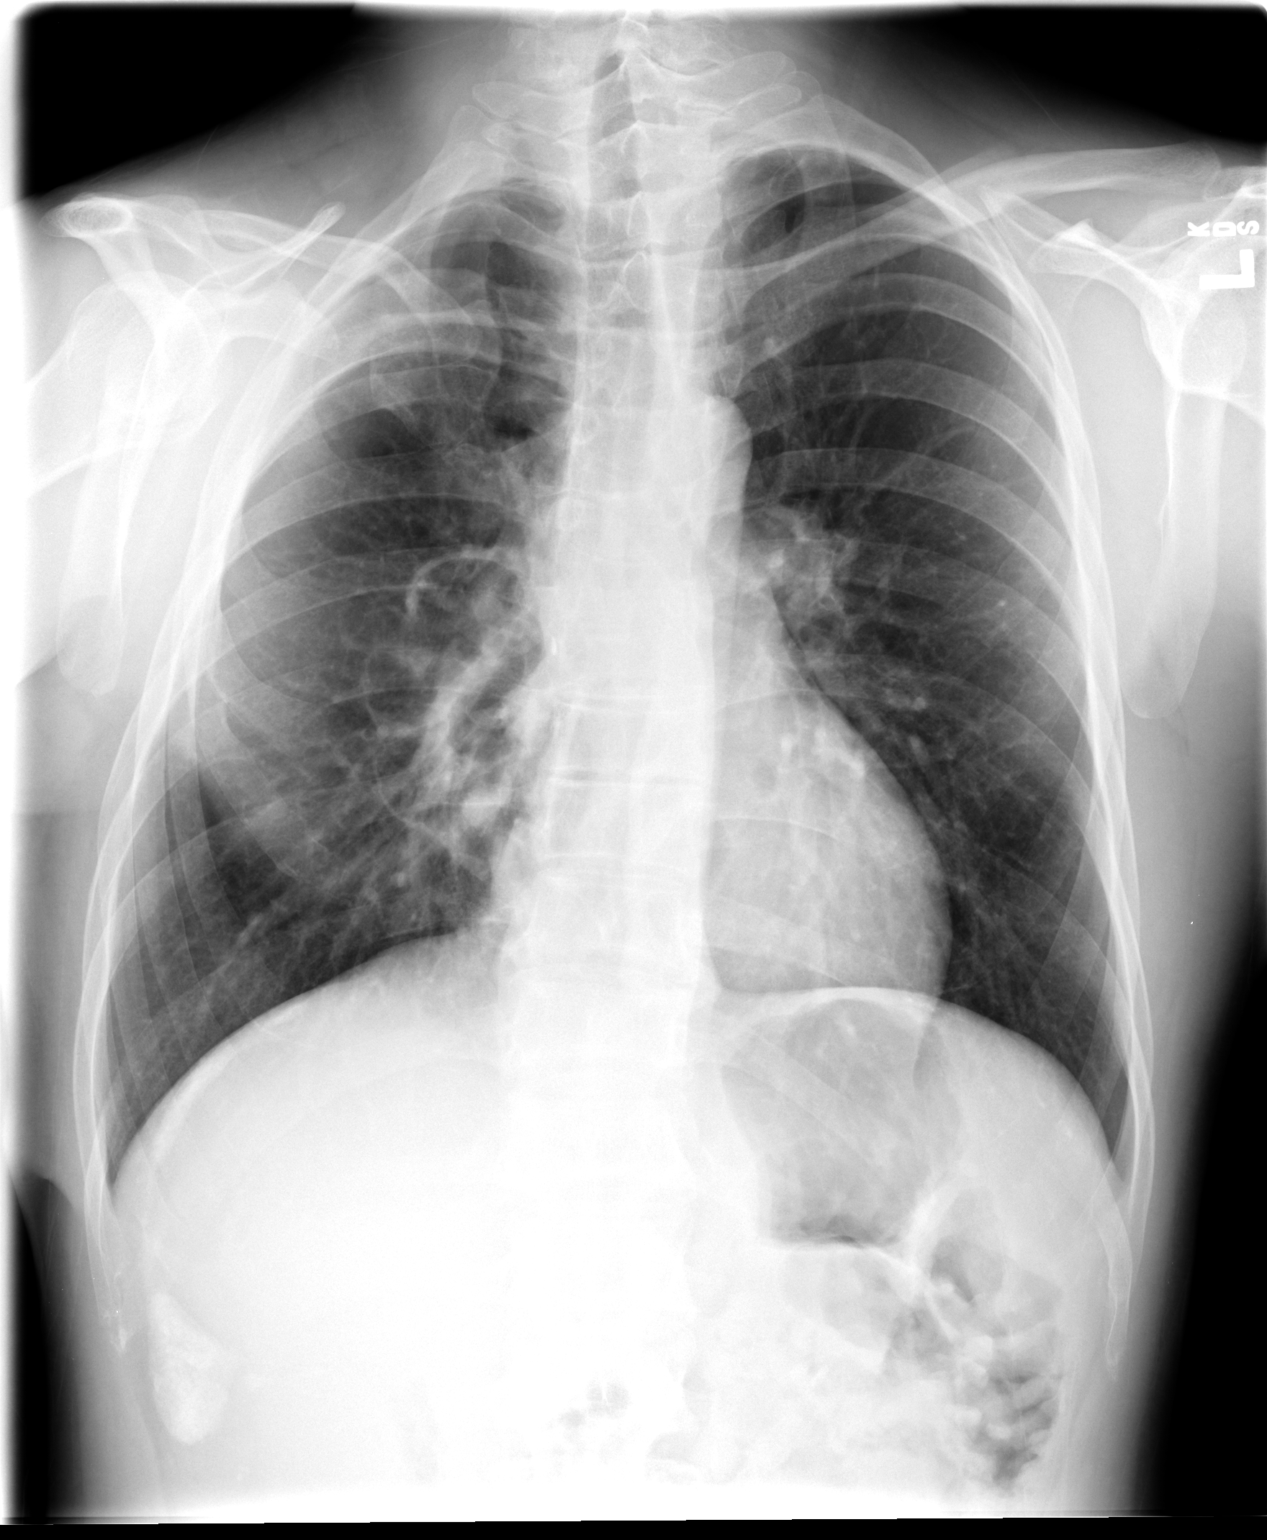

[view not recorded (2 of 2)]
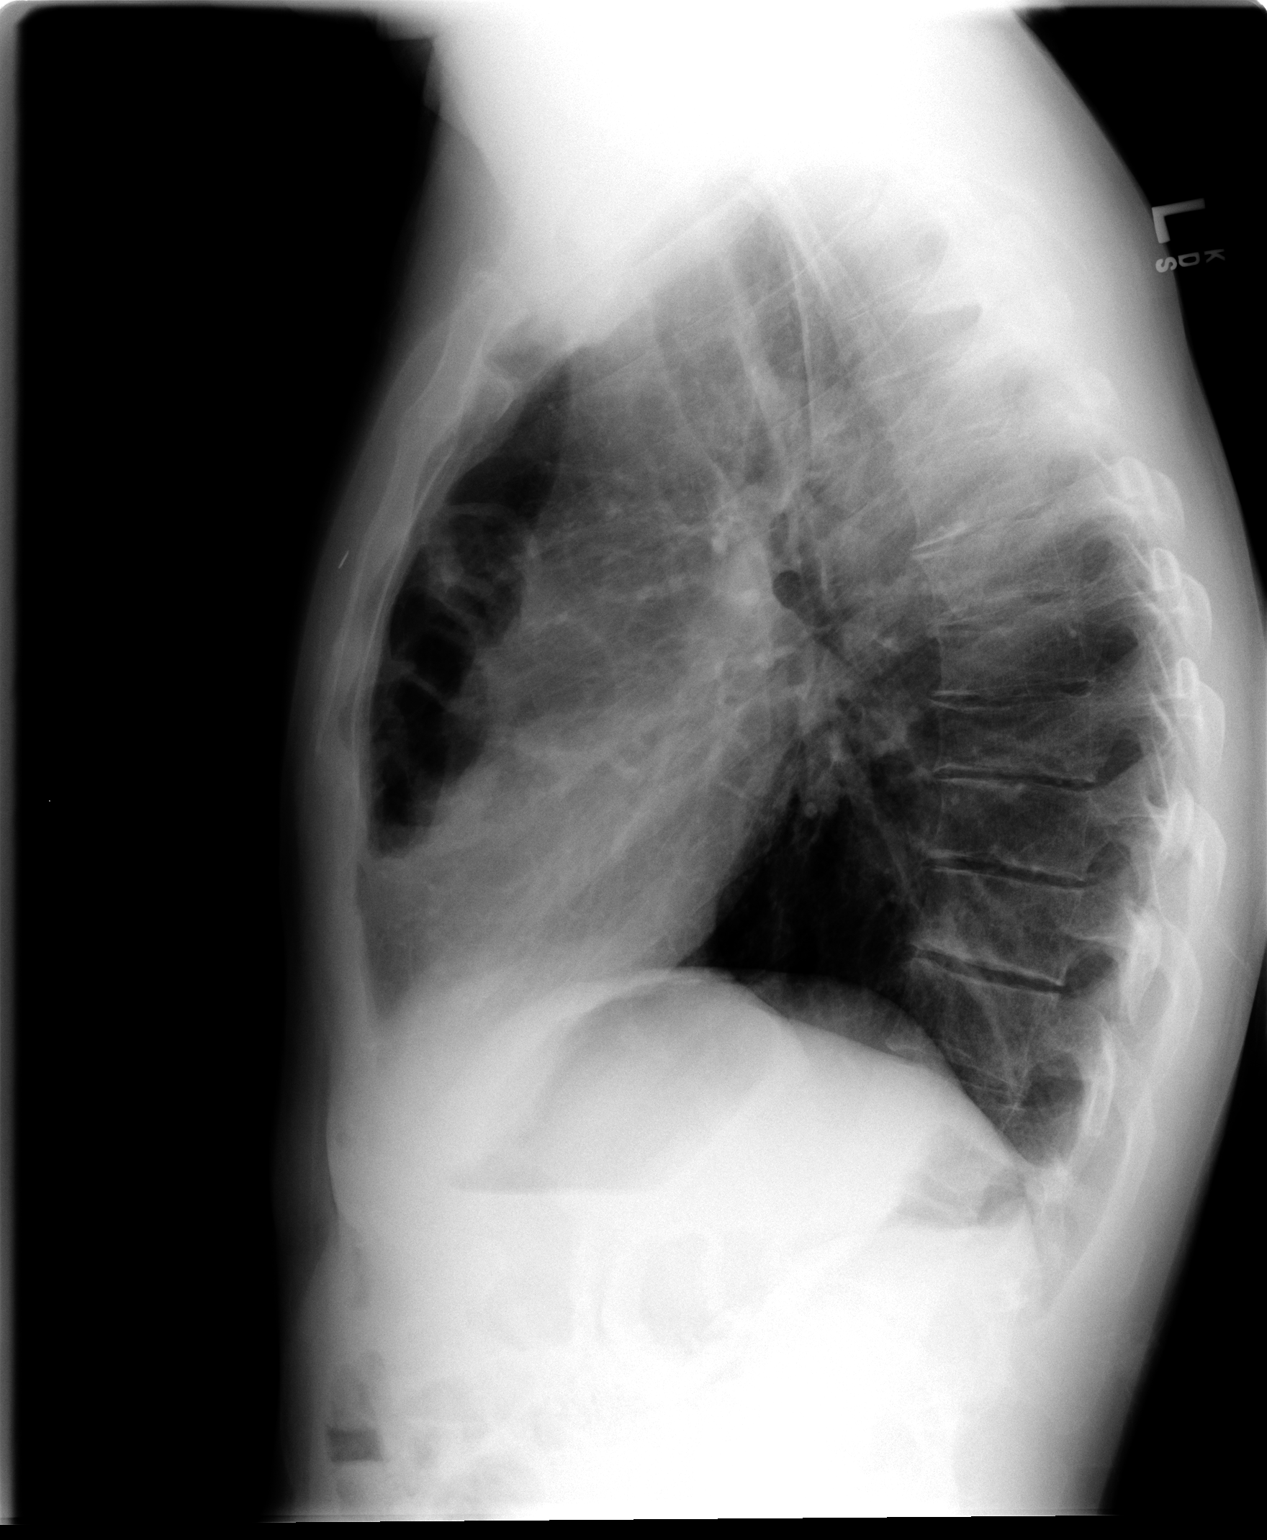

[2 of 2 positions shown; findings below may reference images not displayed]

FINDINGS: Heart and vascularity normal.  Lungs are clear.  There is interposed colon in the right anterior mediastinum as before.  There are deformities of the right ribs and right clavicle with outward deviation of the scapula.  
 There is a calcification in the right upper quadrant that was present previously.
IMPRESSION: 1.  No active cardiopulmonary disease.  
 2.  Postoperative changes with colon interposed in the anterior/right mediastinum. 
 3.  Deformities of the right rib cage and right shoulder girdle.

## 2007-07-09 ENCOUNTER — Emergency Department (HOSPITAL_COMMUNITY): Admission: EM | Admit: 2007-07-09 | Discharge: 2007-07-10 | Payer: Self-pay | Admitting: Emergency Medicine

## 2007-08-10 ENCOUNTER — Emergency Department (HOSPITAL_COMMUNITY): Admission: EM | Admit: 2007-08-10 | Discharge: 2007-08-10 | Payer: Self-pay | Admitting: Emergency Medicine

## 2007-09-18 ENCOUNTER — Emergency Department (HOSPITAL_COMMUNITY): Admission: EM | Admit: 2007-09-18 | Discharge: 2007-09-18 | Payer: Self-pay | Admitting: Emergency Medicine

## 2007-09-26 ENCOUNTER — Emergency Department (HOSPITAL_COMMUNITY): Admission: EM | Admit: 2007-09-26 | Discharge: 2007-09-26 | Payer: Self-pay | Admitting: Emergency Medicine

## 2007-12-14 IMAGING — CR DG CHEST 2V
2 series · 2 of 2 positions shown · non-contrast
Comparison: 01/27/07 and 08/22/06.

CLINICAL DATA: Back and chest pain.  
PA AND LATERAL CHEST ? 2 VIEW:

[view not recorded (1 of 2)]
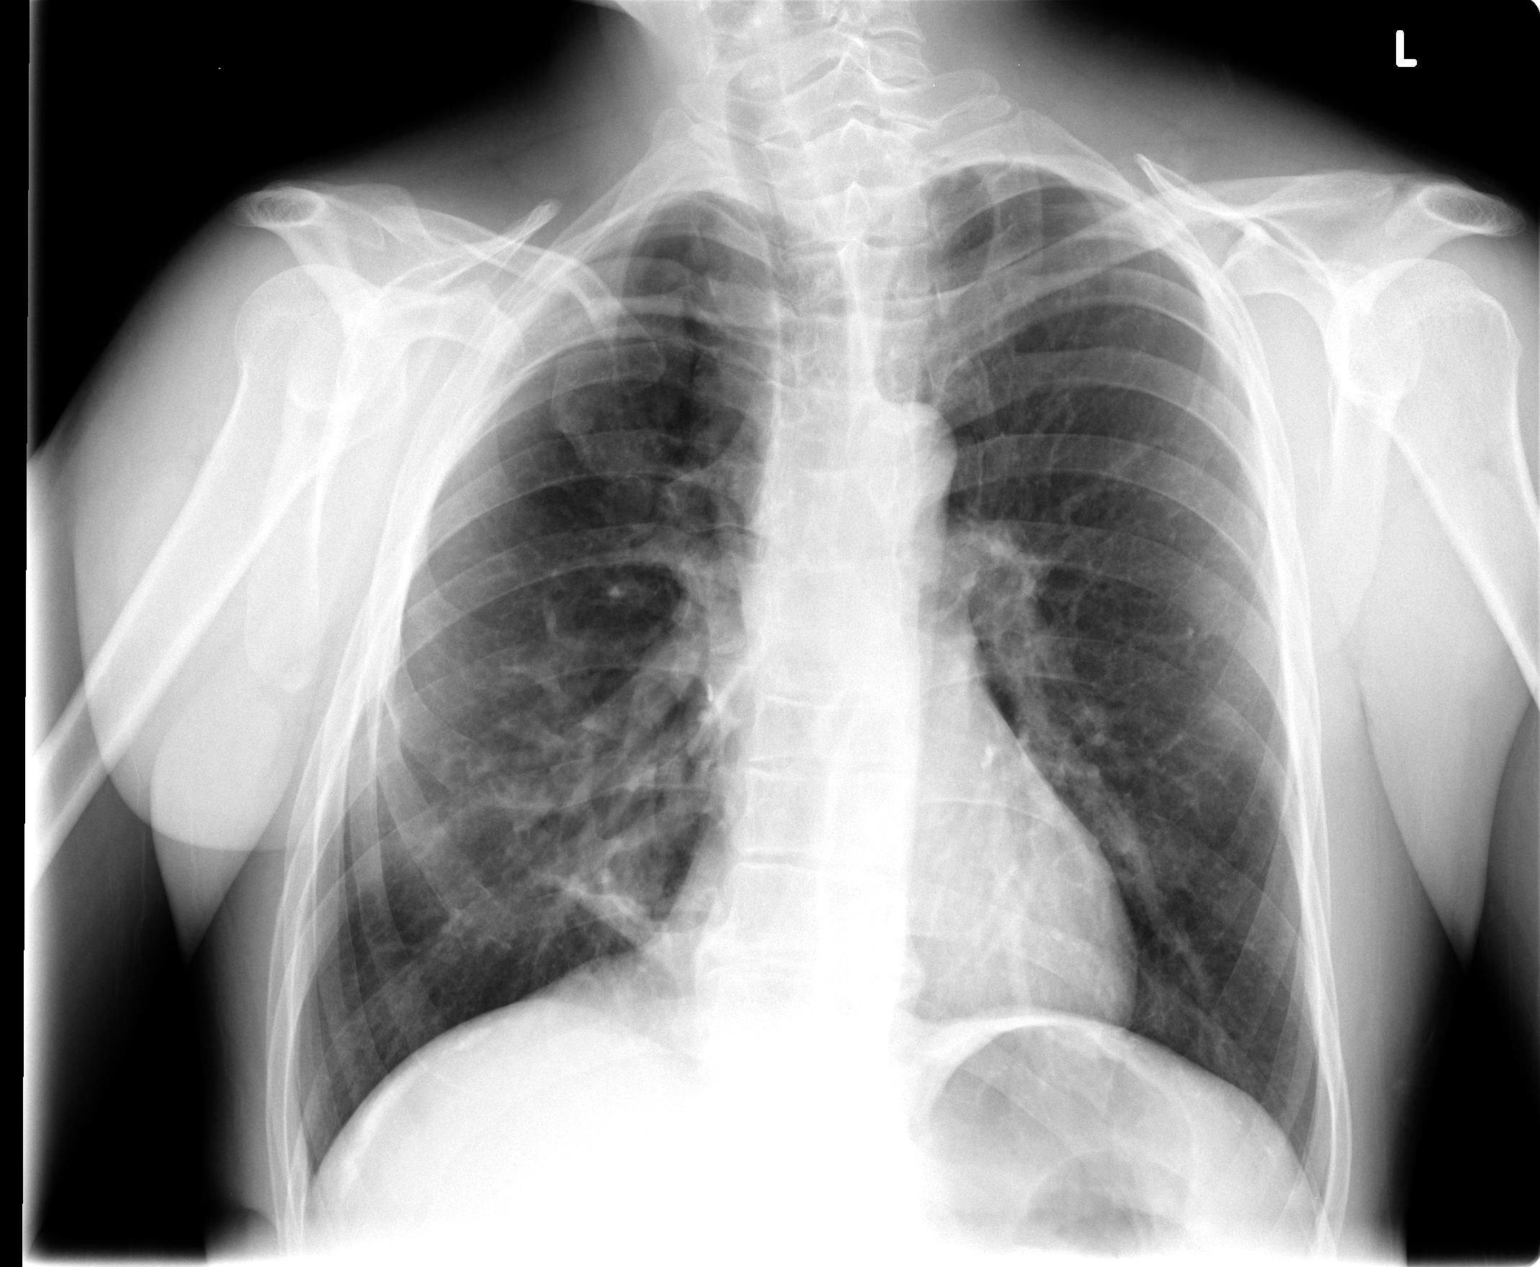

[view not recorded (2 of 2)]
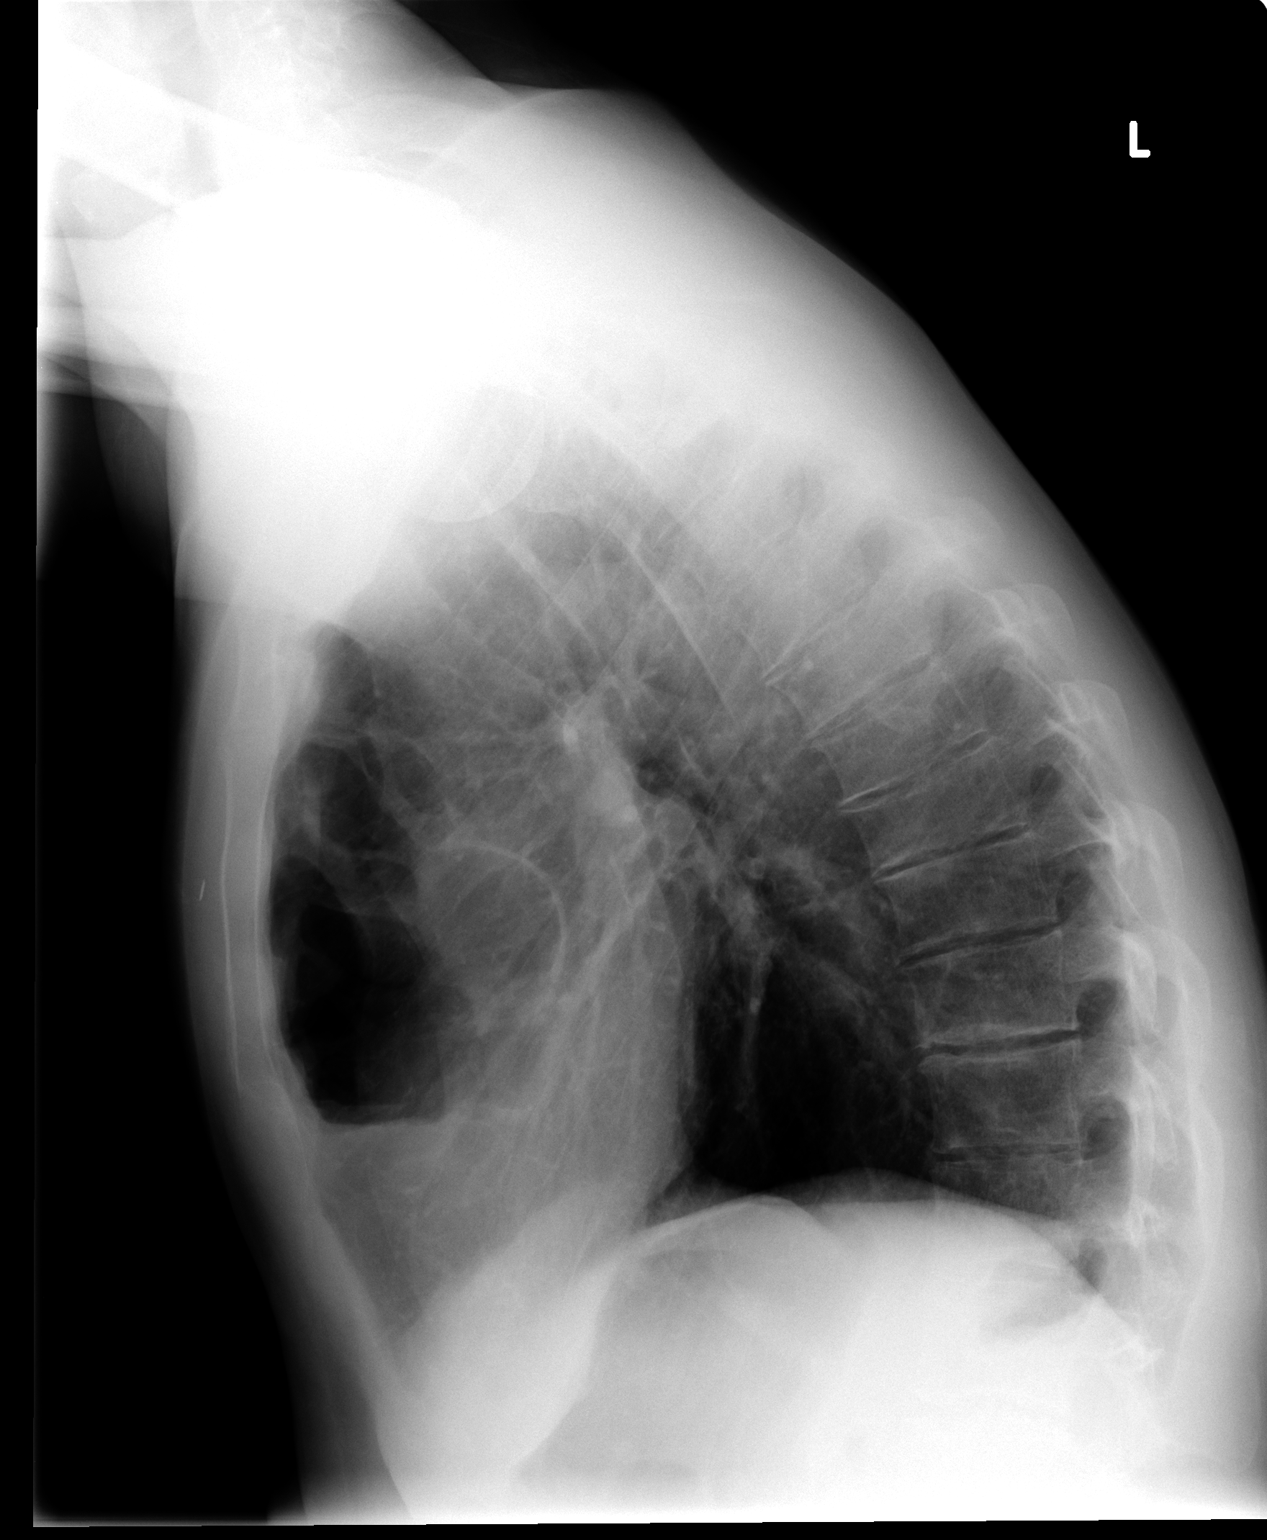

[2 of 2 positions shown; findings below may reference images not displayed]

FINDINGS: There is discoid atelectasis in the right lung base.  Interposed colon in the anterior mediastinum as seen on prior studies is again noted.  Chronic deformity of right clavicle and upper right ribs again noted.
IMPRESSION: Mild discoid atelectasis right lung base.  Otherwise stable exam.

## 2008-01-29 ENCOUNTER — Emergency Department (HOSPITAL_COMMUNITY): Admission: EM | Admit: 2008-01-29 | Discharge: 2008-01-29 | Payer: Self-pay | Admitting: Emergency Medicine

## 2008-03-27 ENCOUNTER — Emergency Department (HOSPITAL_COMMUNITY): Admission: EM | Admit: 2008-03-27 | Discharge: 2008-03-27 | Payer: Self-pay | Admitting: Emergency Medicine

## 2008-04-25 IMAGING — CR DG CHEST 2V
2 series · 2 of 2 positions shown · non-contrast
Comparison: 09/18/07 and 09/30/06.

CLINICAL DATA: 36 year old; chest pain and weakness.
CHEST - 2 VIEW - 01/29/08:

[view not recorded (1 of 2)]
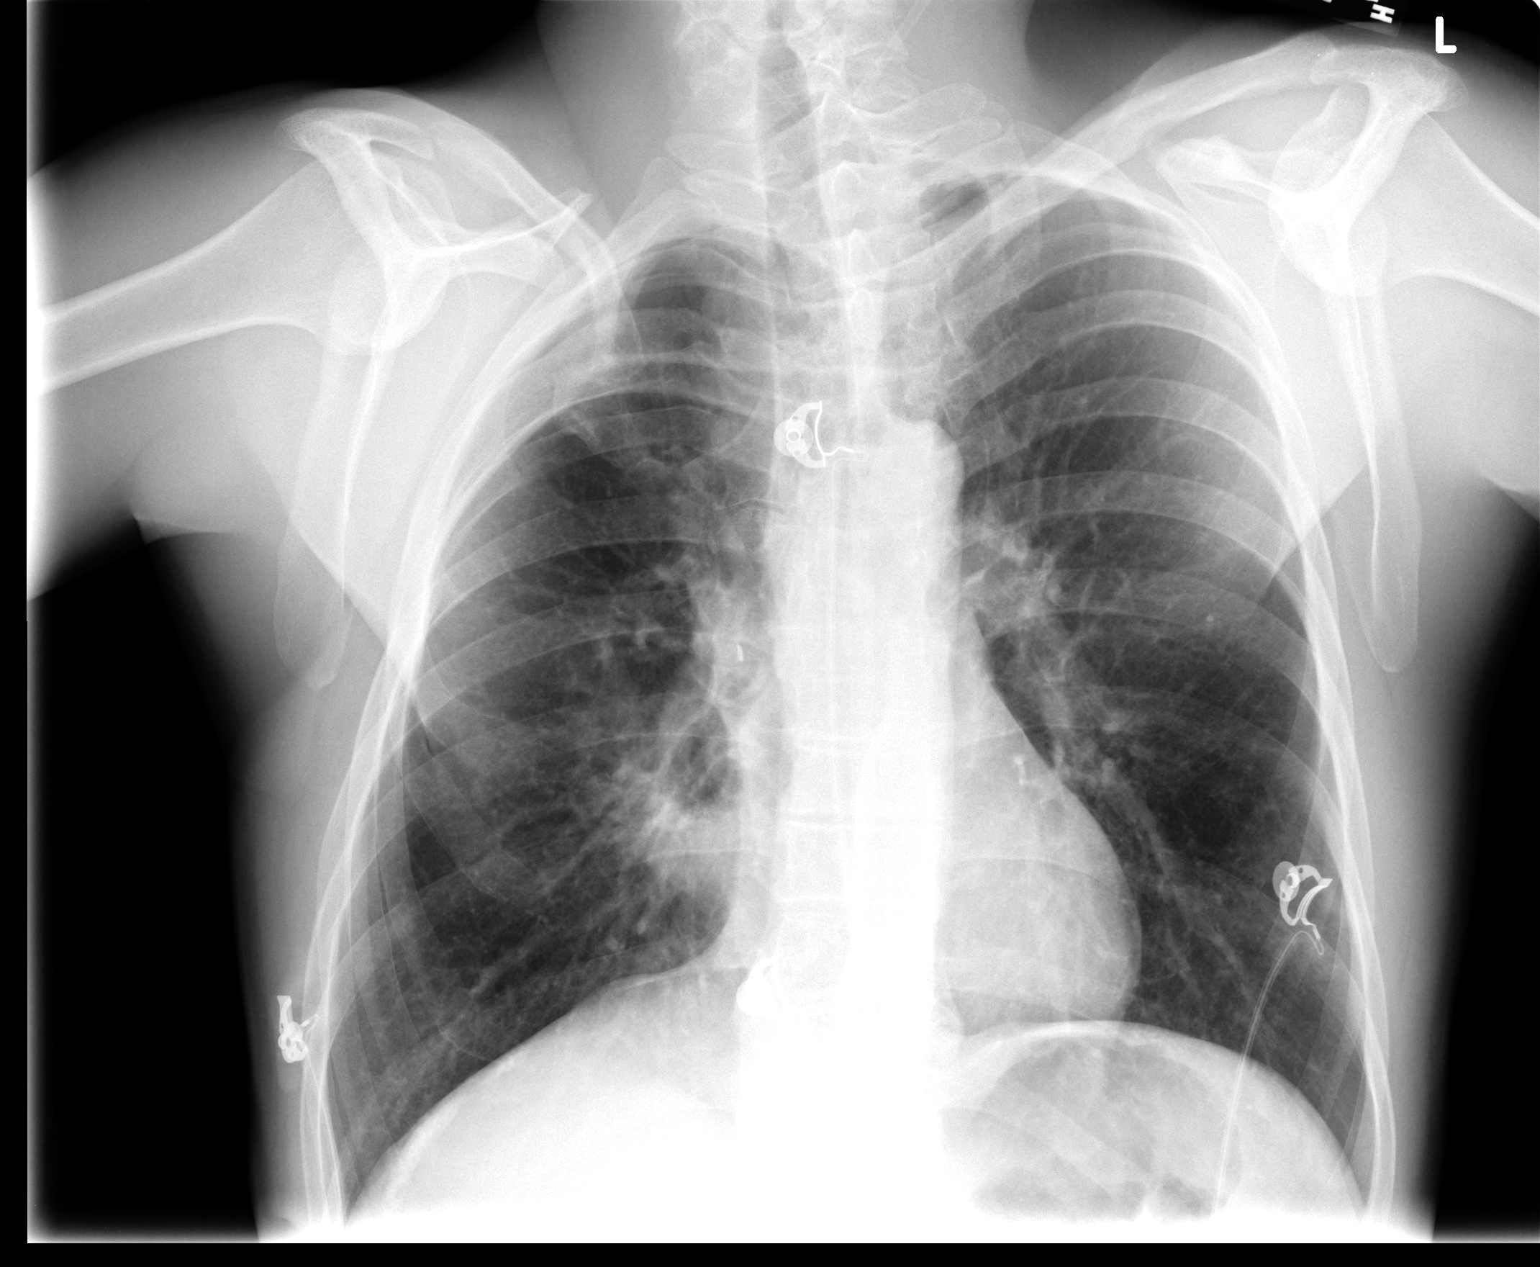

[view not recorded (2 of 2)]
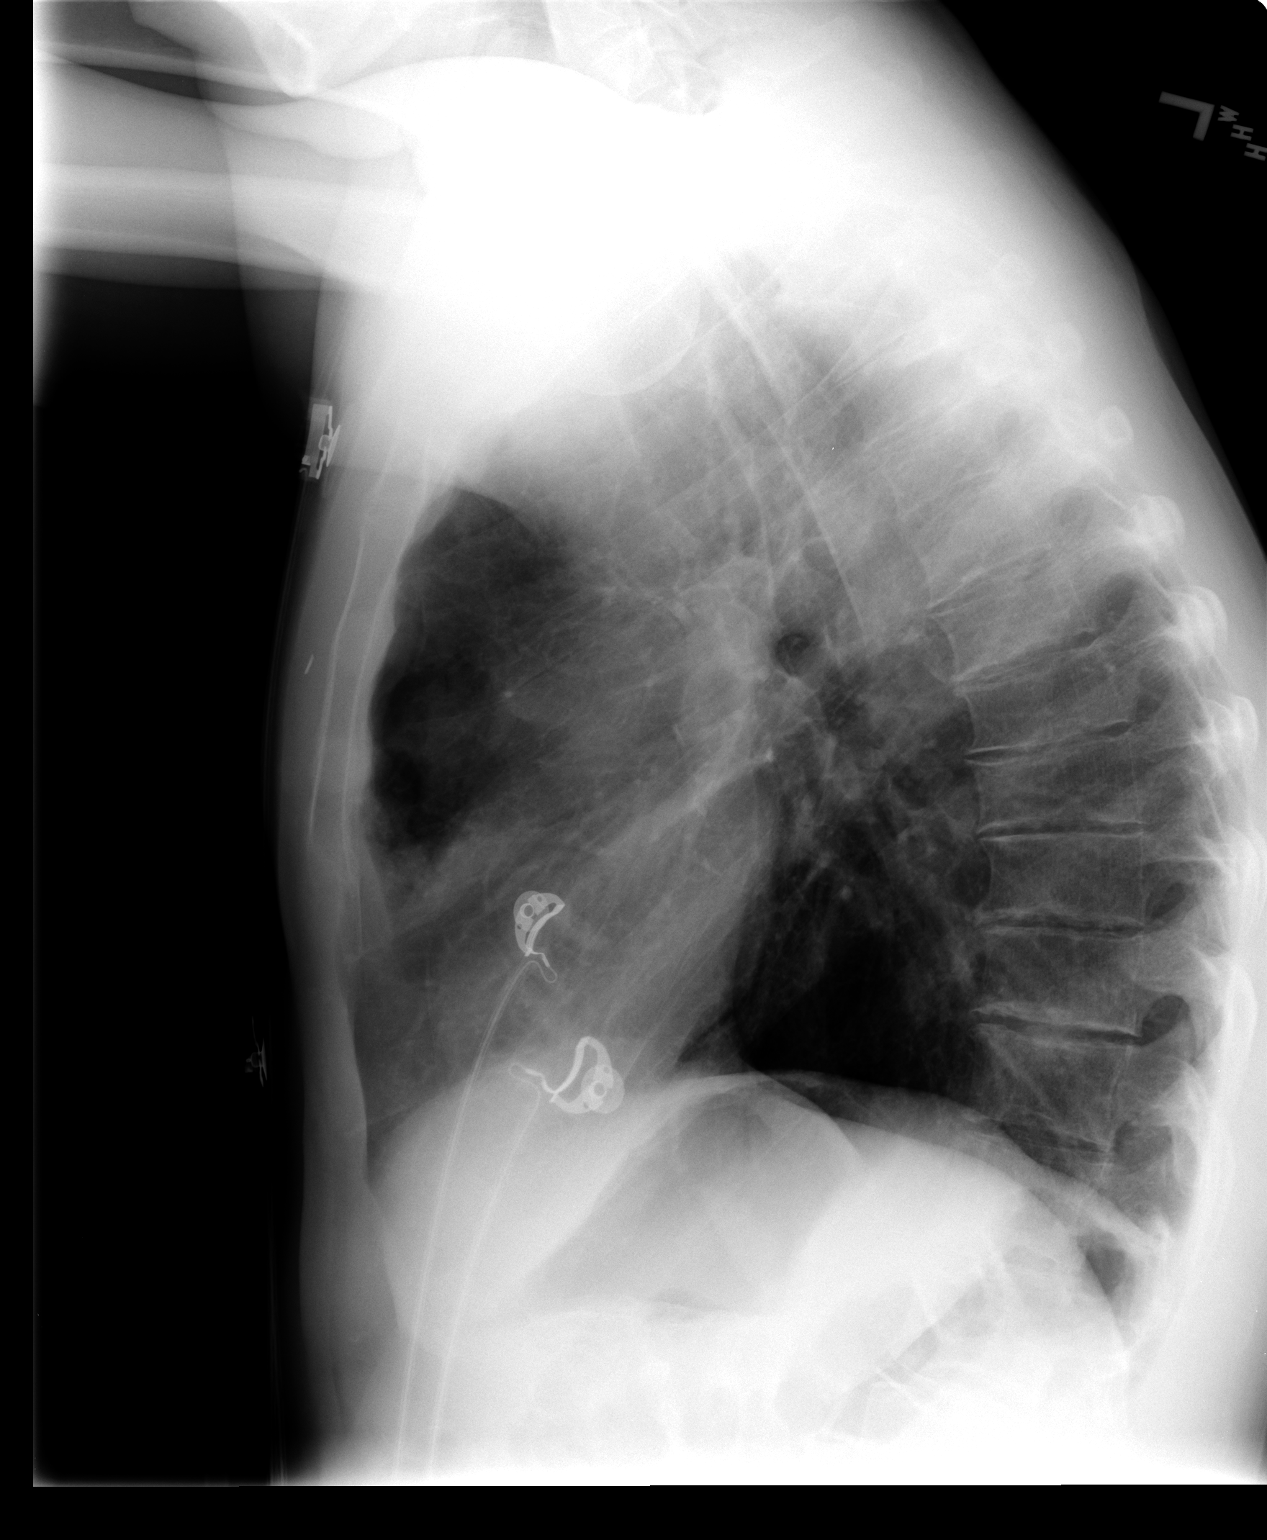

[2 of 2 positions shown; findings below may reference images not displayed]

FINDINGS: The cardiac silhouette, mediastinal and hilar contours are within normal limits and stable.  Chronic dislocated right clavicle head.  The patient has had a colonic pull-through procedure for esophageal atresia.  I don?t see any acute pulmonary findings.
IMPRESSION: 1.  Chronic right clavicle head dislocation.
2.  Surgical changes related to repair of esophageal atresia.
3.  No acute cardiopulmonary findings.

## 2009-01-15 ENCOUNTER — Emergency Department (HOSPITAL_COMMUNITY): Admission: EM | Admit: 2009-01-15 | Discharge: 2009-01-15 | Payer: Self-pay | Admitting: Emergency Medicine

## 2009-05-26 ENCOUNTER — Emergency Department (HOSPITAL_COMMUNITY): Admission: EM | Admit: 2009-05-26 | Discharge: 2009-05-26 | Payer: Self-pay | Admitting: Emergency Medicine

## 2009-06-15 ENCOUNTER — Inpatient Hospital Stay (HOSPITAL_COMMUNITY): Admission: EM | Admit: 2009-06-15 | Discharge: 2009-10-16 | Payer: Self-pay

## 2009-06-15 ENCOUNTER — Encounter: Payer: Self-pay | Admitting: Emergency Medicine

## 2009-06-21 ENCOUNTER — Ambulatory Visit: Payer: Self-pay | Admitting: Vascular Surgery

## 2009-06-21 ENCOUNTER — Encounter (INDEPENDENT_AMBULATORY_CARE_PROVIDER_SITE_OTHER): Payer: Self-pay | Admitting: Orthopedic Surgery

## 2009-06-28 ENCOUNTER — Encounter (INDEPENDENT_AMBULATORY_CARE_PROVIDER_SITE_OTHER): Payer: Self-pay | Admitting: General Surgery
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
## 2009-09-10 IMAGING — CR DG CHEST 1V PORT
1 series · 1 of 1 positions shown · non-contrast
Comparison: Earlier today

CLINICAL DATA: Postop

PORTABLE CHEST - 1 VIEW

[AP]
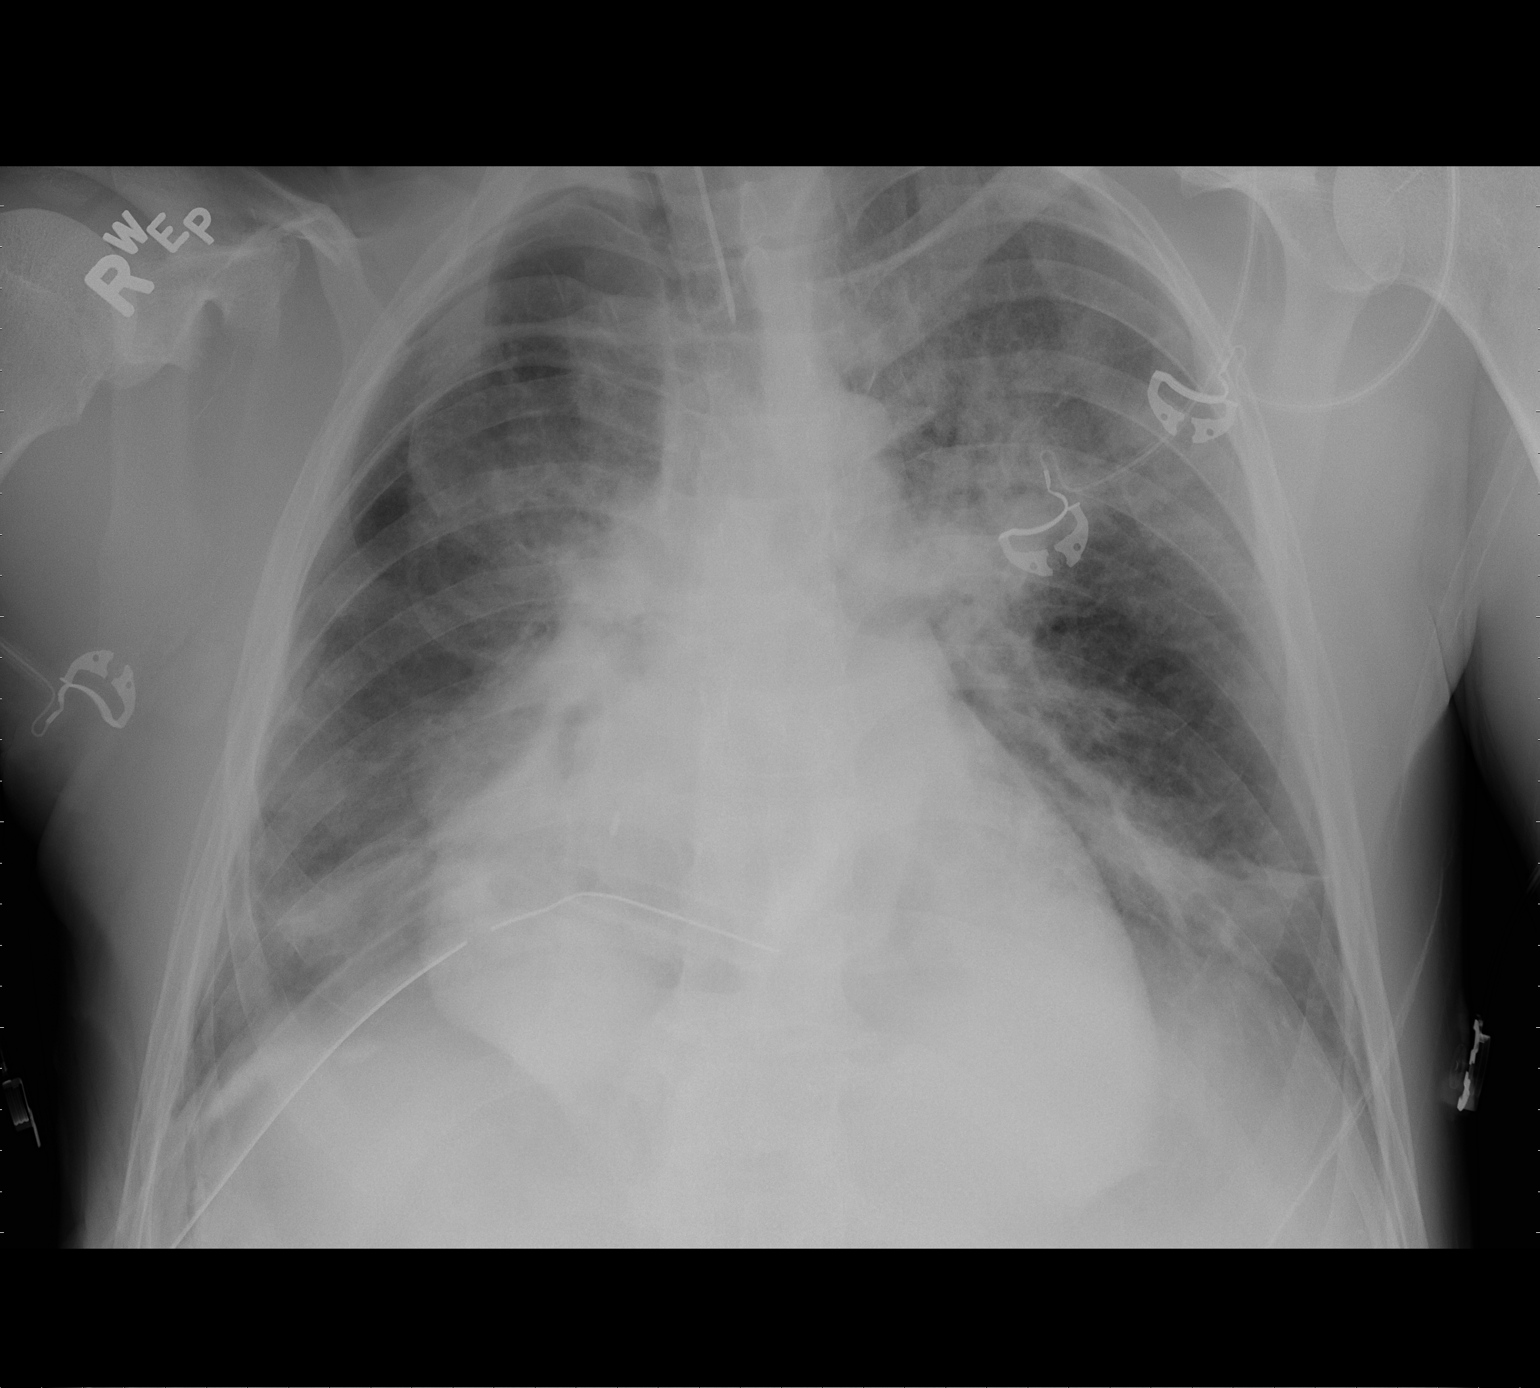

[1 of 1 positions shown; findings below may reference images not displayed]

FINDINGS: Stable endotracheal tube.  Airspace opacities throughout
both lungs have worsened.  A chest tube projects over the right
lung base.  No pneumothorax.  Free intraperitoneal gas under the
right hemidiaphragm persists.
IMPRESSION: Stable endotracheal tube.

New right chest tube without pneumothorax.

Free intraperitoneal gas.

Worsening airspace opacities.
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
## 2009-09-12 IMAGING — CR DG CHEST 1V PORT
1 series · 1 of 1 positions shown · non-contrast
Comparison: 06/17/2009

CLINICAL DATA: PICC placement.

PORTABLE CHEST - 1 VIEW

[AP]
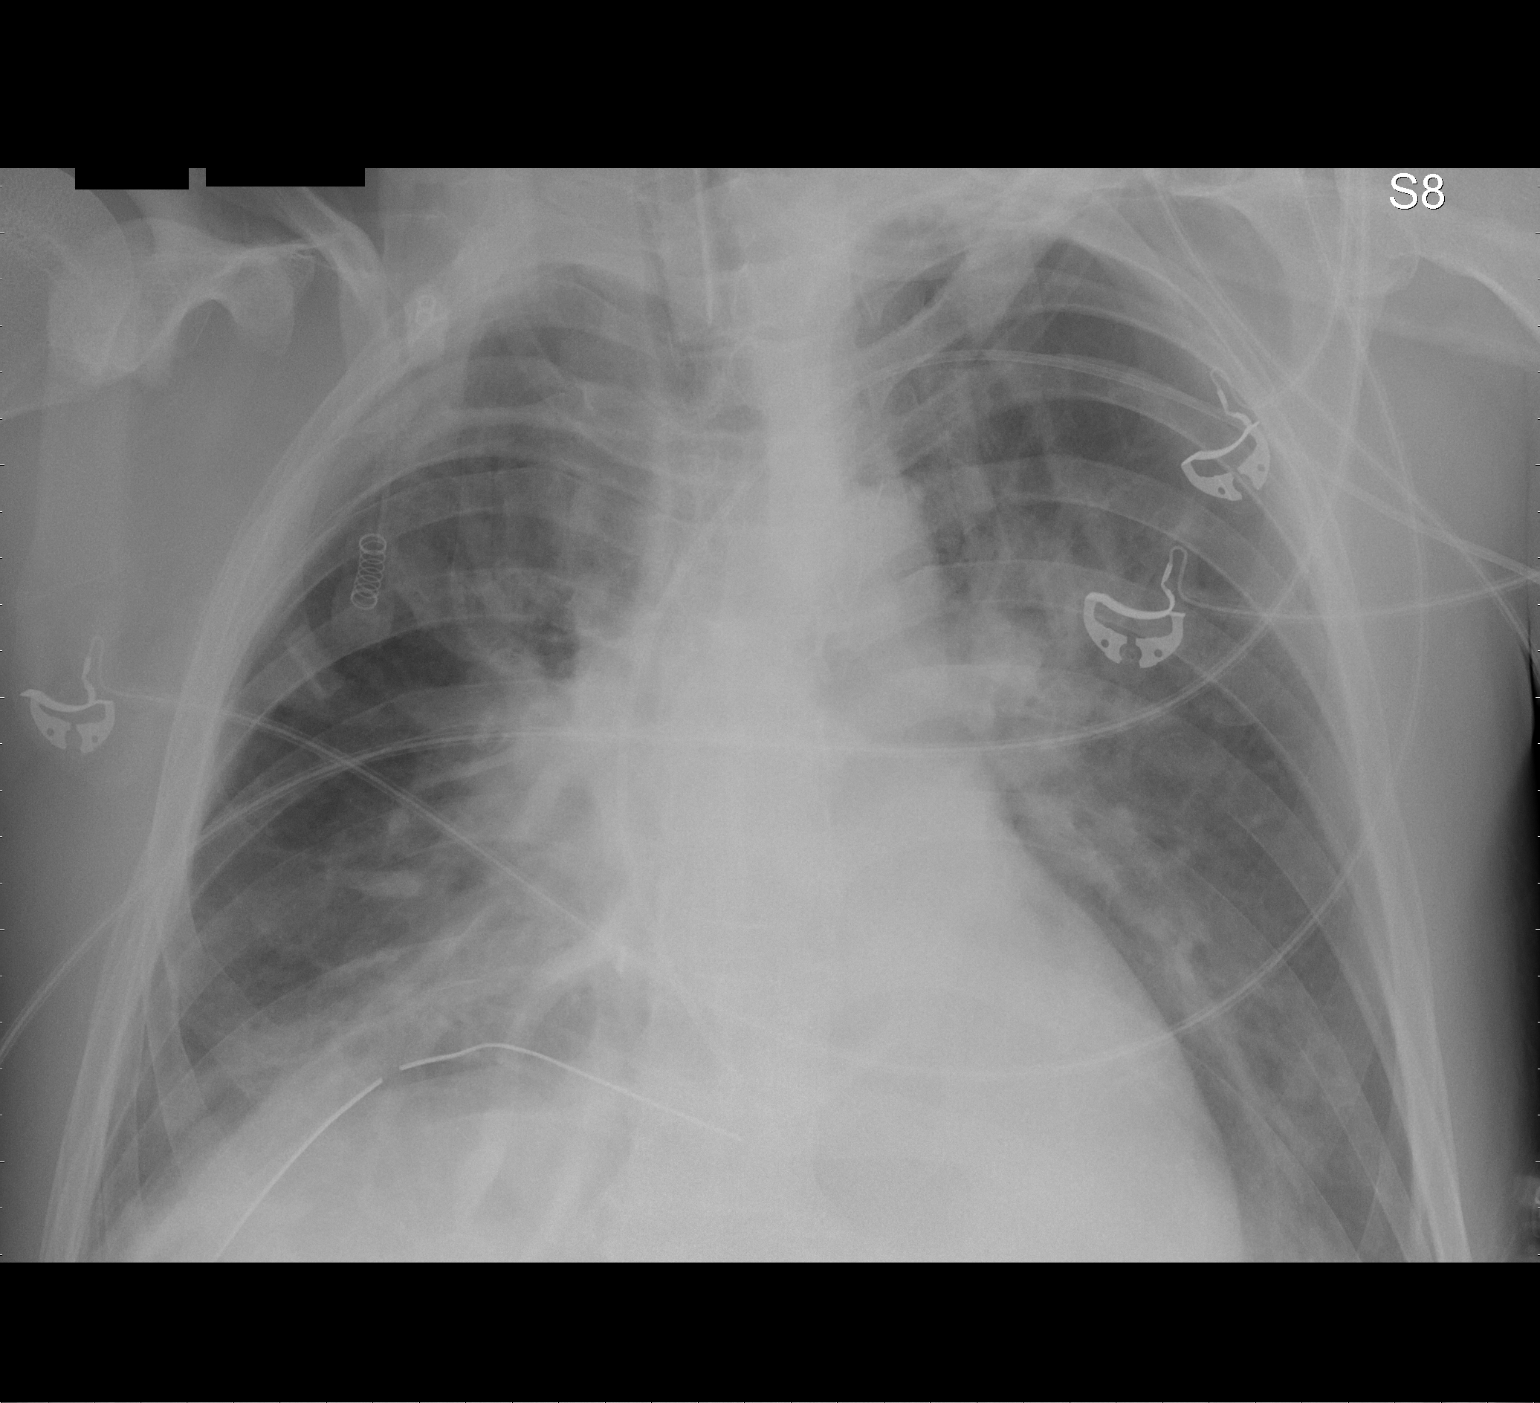

[1 of 1 positions shown; findings below may reference images not displayed]

FINDINGS: Endotracheal tube is in satisfactory position.  Left PICC
tip projects in the region of the SVC/RA junction.  Right chest
tube is at the base of the right hemithorax.

Heart size is grossly stable.  There is bilateral air space
disease.
IMPRESSION: Persistent bilateral air space disease.
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
## 2009-09-13 IMAGING — CR DG CHEST 1V PORT
1 series · 1 of 1 positions shown · non-contrast
Comparison: 06/17/2009

CLINICAL DATA: Follow up pneumothorax

PORTABLE CHEST - 1 VIEW at 0960 hours

[view not recorded]
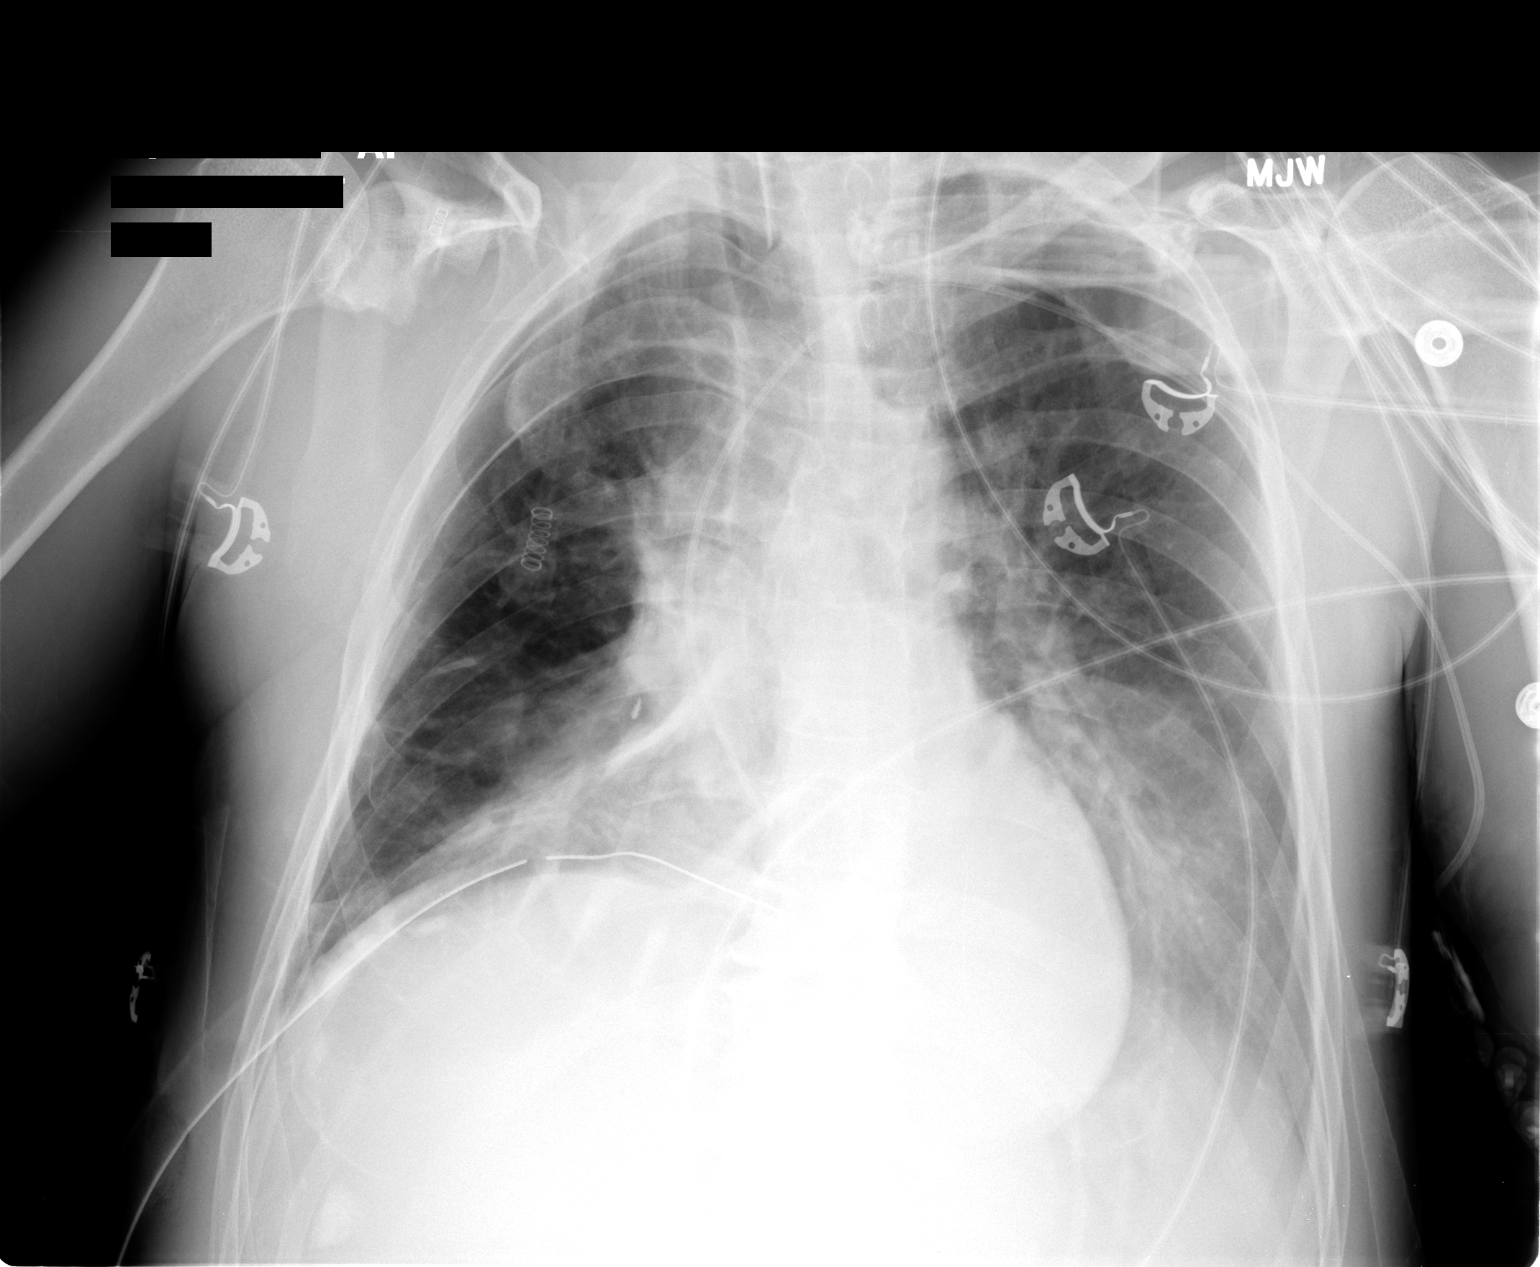

[1 of 1 positions shown; findings below may reference images not displayed]

FINDINGS: Left lower lobe atelectasis.  Mild atelectasis at the
right base.  Left upper extremity PICC line is in the SVC - right
atrial junction.  ET tube position satisfactory.  Chest tube at the
base of the right hemithorax.  No pneumothorax.
IMPRESSION: Atelectasis at the bases. Overall improved aeration of the lungs.
No pneumothorax.
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
## 2009-09-19 IMAGING — CR DG CHEST 1V PORT
1 series · 1 of 1 positions shown · non-contrast
Comparison: 06/24/2009

CLINICAL DATA: Pneumothorax.

PORTABLE CHEST - 1 VIEW

[AP]
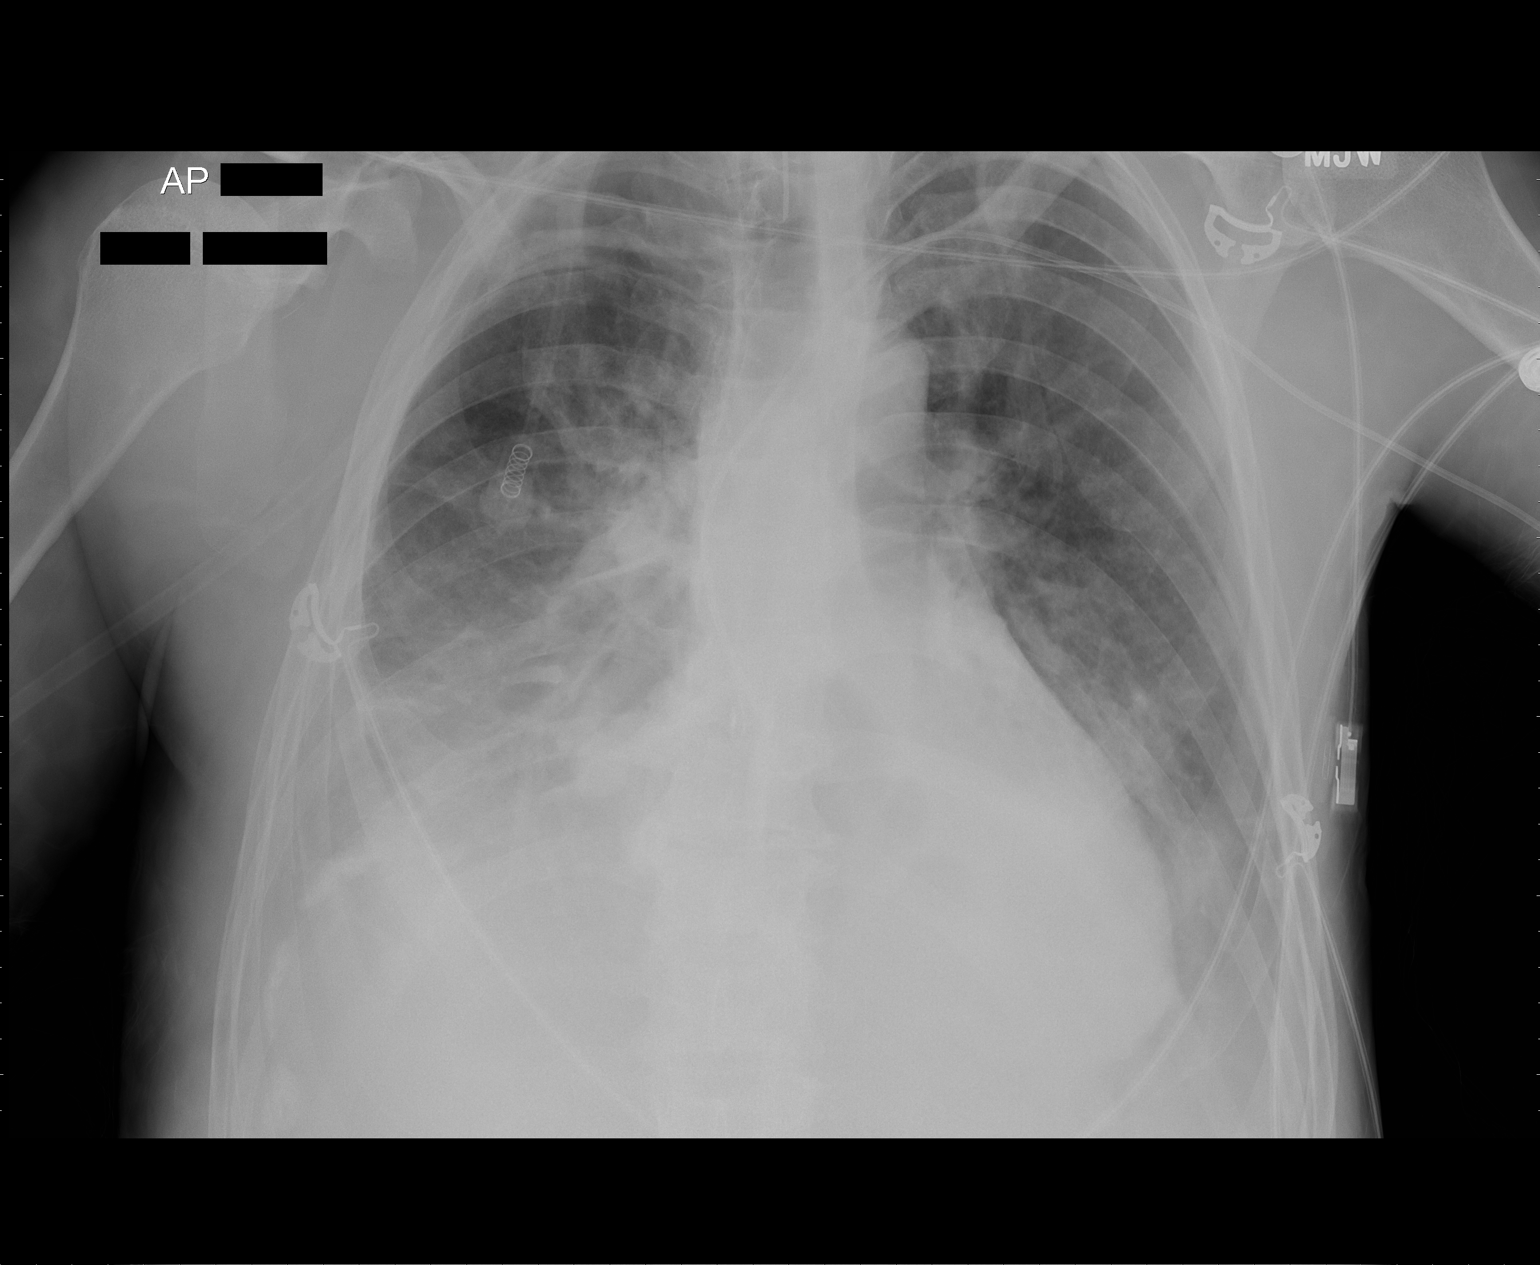

[1 of 1 positions shown; findings below may reference images not displayed]

FINDINGS: Endotracheal tube is in satisfactory position.  Left PICC
tip projects over the distal SVC.  Heart size stable.  Right chest
tube has been removed in the interval.

No definite pneumothorax.  Bibasilar collapse/consolidation and
bilateral pleural effusions persist.  There is central pulmonary
vascular congestion. Right clavicle fracture is noted.
IMPRESSION: 1.  No definite pneumothorax after removal of right chest tube.
2.  Bibasilar collapse/consolidation with bilateral pleural
effusions.
3.  Central pulmonary vascular congestion.
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
## 2009-09-28 IMAGING — CR DG CHEST 1V PORT
1 series · 1 of 1 positions shown · non-contrast
Comparison: Chest radiograph 07/02/2009

CLINICAL DATA: Follow-up pneumothorax

PORTABLE CHEST - 1 VIEW

[view not recorded]
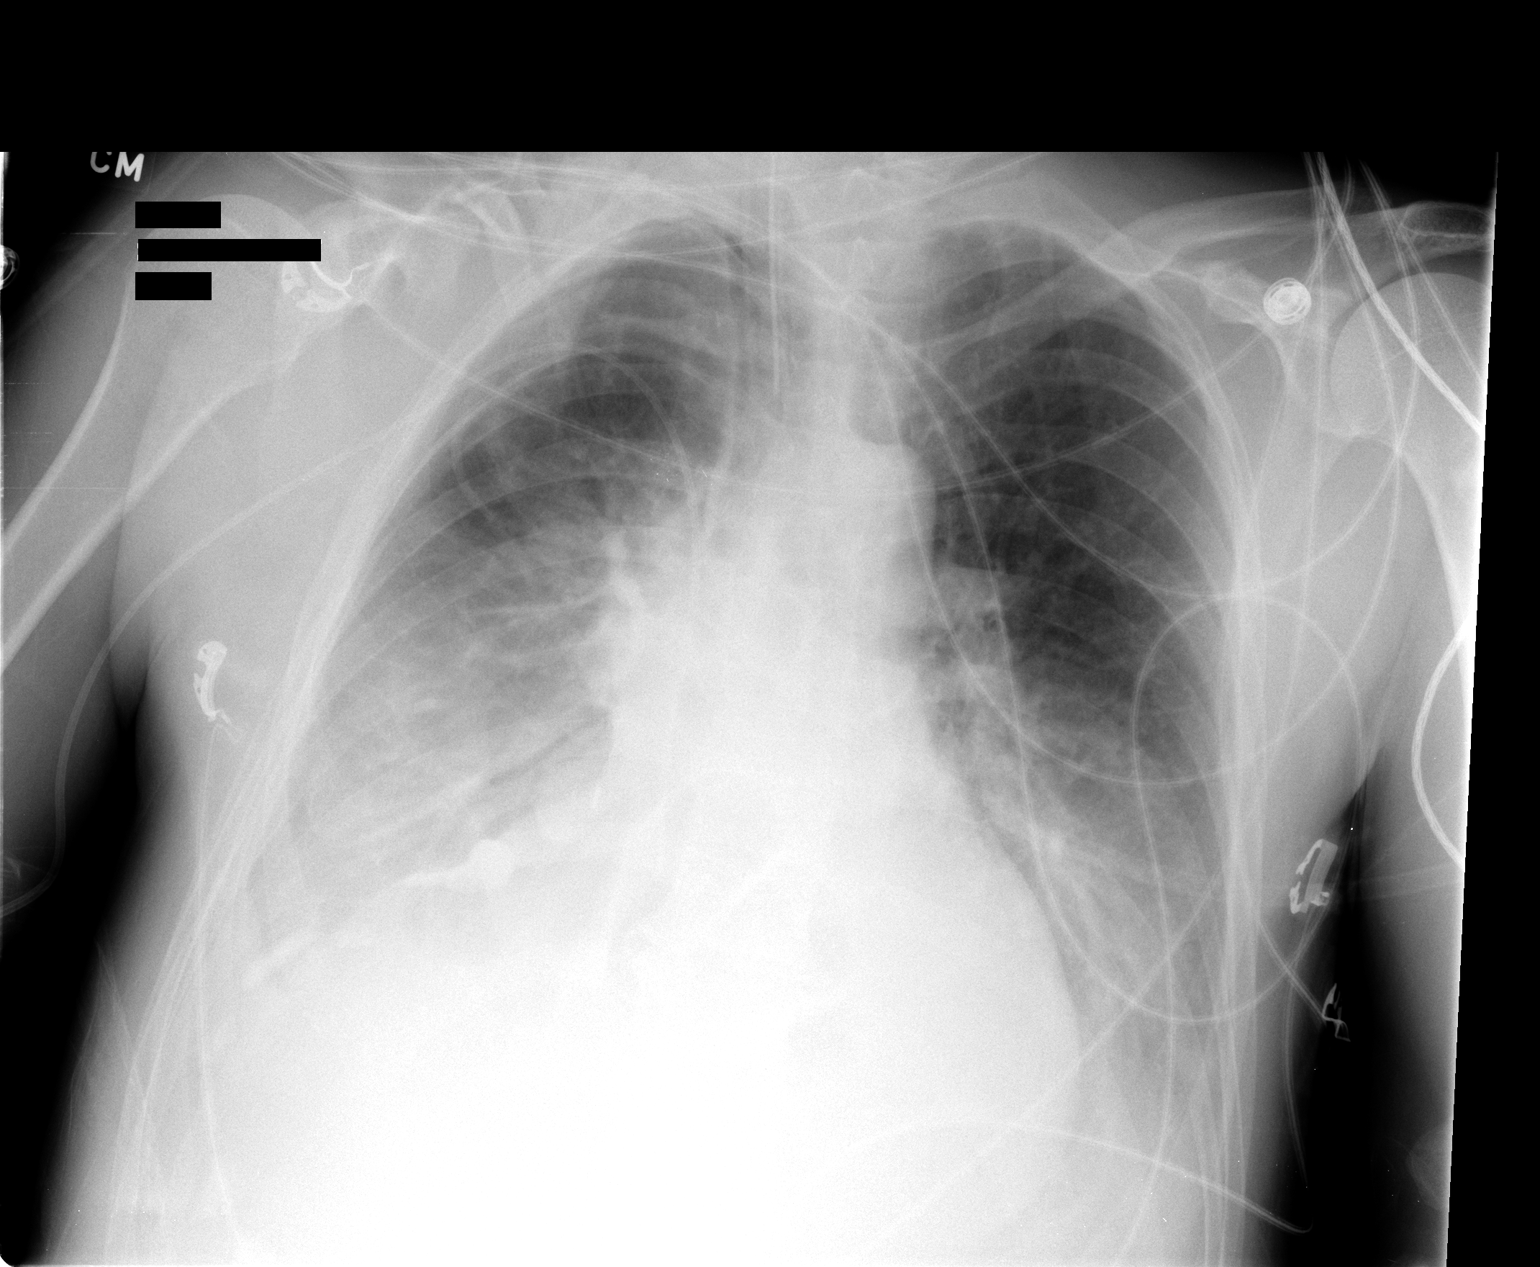

[1 of 1 positions shown; findings below may reference images not displayed]

FINDINGS: Endotracheal tube and right PICC line are unchanged.
Stable enlarged heart silhouette.  There bilateral pleural
effusions and bibasilar opacities unchanged from prior.  Upper
lungs appear clear.  No pneumothorax. High density material
projecting over the right hemidiaphragm of unclear etiology.
Attention on follow-up.
IMPRESSION: 1.  No significant change.
2.  Bilateral pleural effusions and bibasilar opacities
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
## 2009-10-02 IMAGING — CR DG CHEST 1V PORT
1 series · 1 of 1 positions shown · non-contrast
Comparison: 07/06/2009

CLINICAL DATA: Follow-up pneumothorax

PORTABLE CHEST - 1 VIEW

[AP]
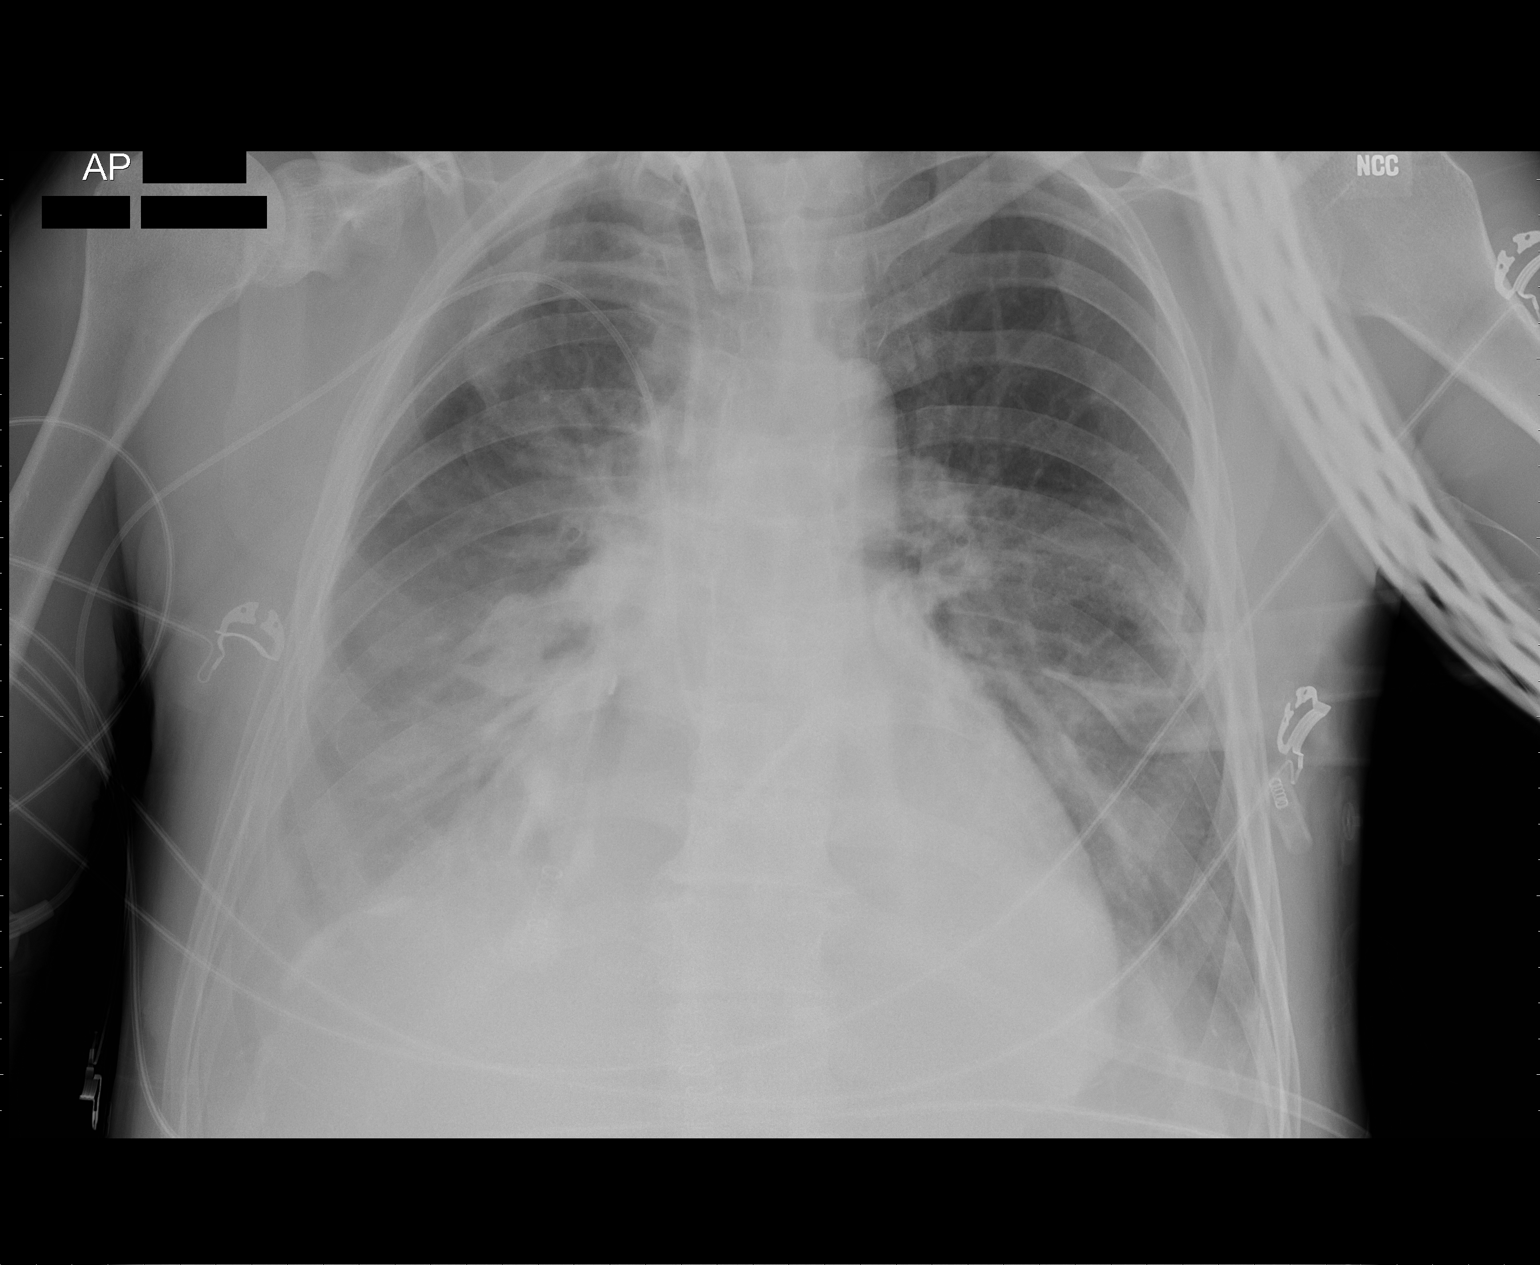

[1 of 1 positions shown; findings below may reference images not displayed]

FINDINGS: Opacity at the right lung base obscuring the right heart
border is stable.  Airspace disease has slightly increased at the
left base.  No definite pneumothorax.  The PICC and tracheostomy
tube are stable.  Calcifications over the right hemidiaphragm are
unchanged.
IMPRESSION: Slightly increased airspace disease at the left base.  Otherwise
stable.
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
## 2009-10-08 IMAGING — CR DG CHEST 1V PORT
1 series · 1 of 1 positions shown · non-contrast
Comparison: 07/12/2009.

CLINICAL DATA: Pneumothorax.

PORTABLE CHEST - 1 VIEW

[view not recorded]
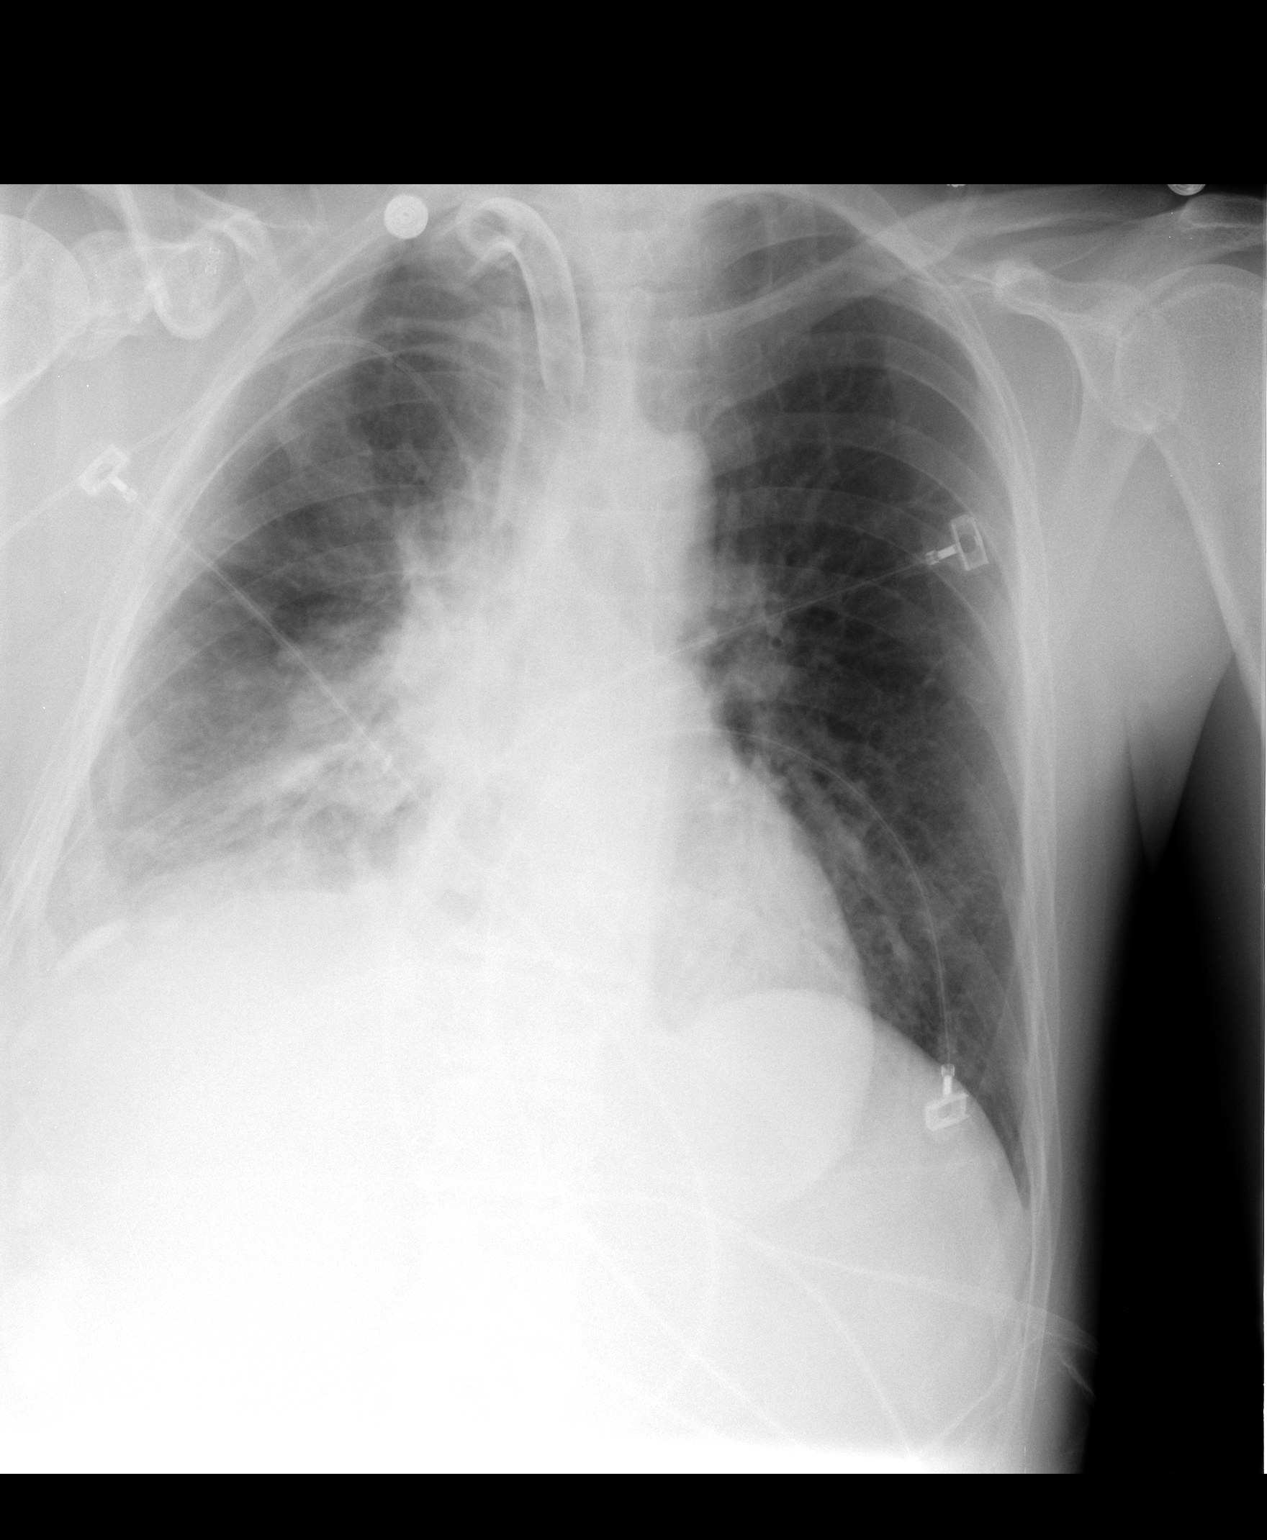

[1 of 1 positions shown; findings below may reference images not displayed]

FINDINGS: 5449 hours.  Tracheostomy tube again noted.  Right PICC
line tip projects at the distal SVC level.  Heart size is normal.
Patchy airspace disease in the right lung is not substantially
changed.  Left lung is relatively clear.
IMPRESSION: Stable exam.
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
## 2009-10-27 IMAGING — CR DG SMALL BOWEL
4 series · 4 of 4 positions shown · non-contrast
Comparison: CT 07/03/2009

CLINICAL DATA: History of enteric fistula.

SMALL BOWEL SERIES
TECHNIQUE: 150 ml of Rmnipaque-LEE were injected through the
percutaneous gastrostomy tube.
Fluoroscopy time:  0  minutes.

[view not recorded (1 of 4)]
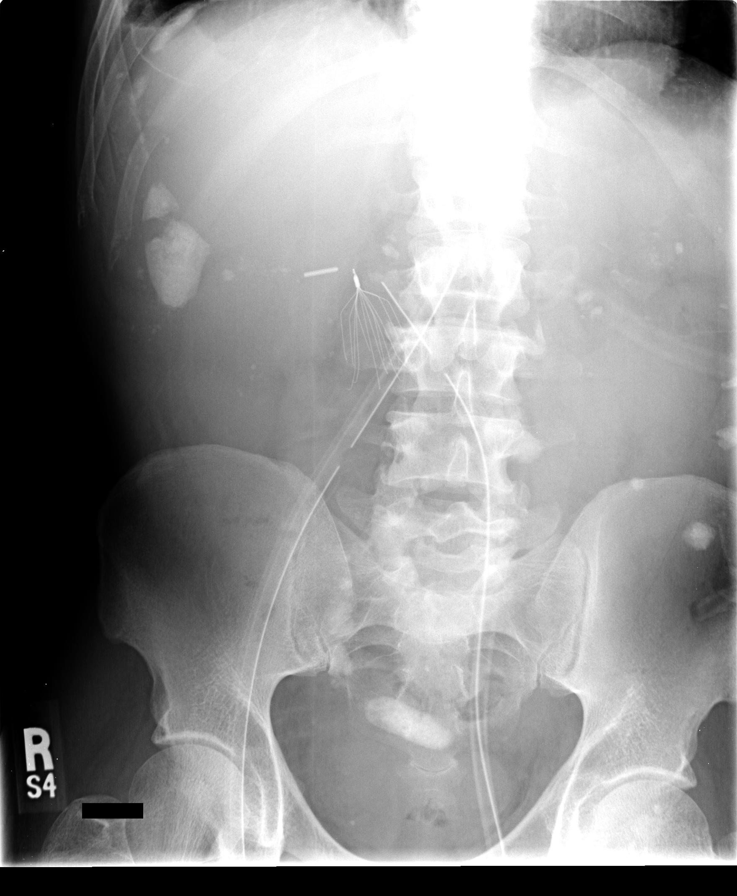

[view not recorded (2 of 4)]
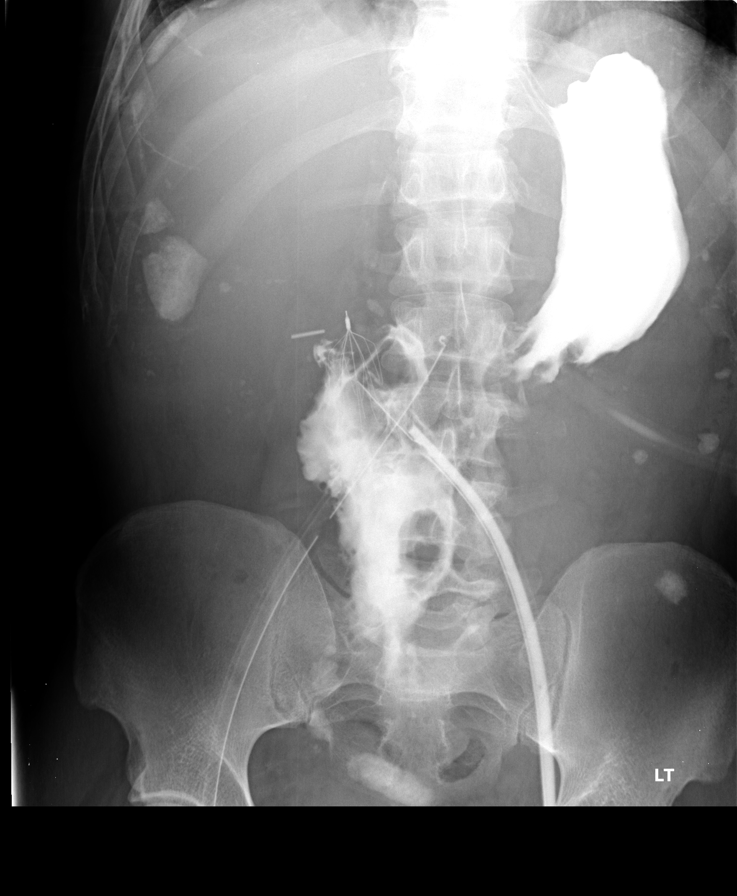

[view not recorded (3 of 4)]
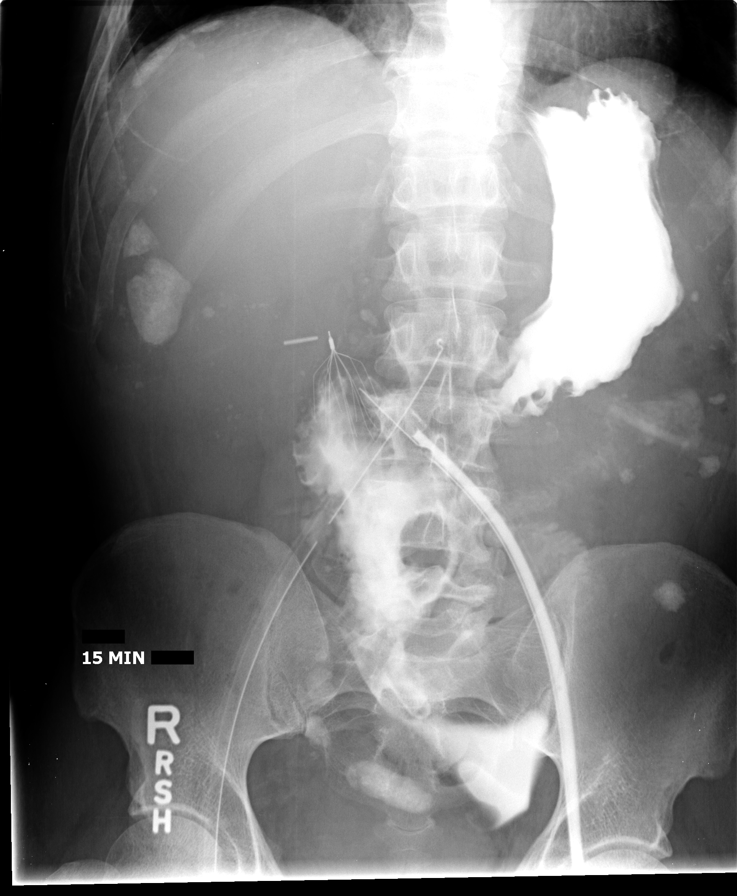

[view not recorded (4 of 4)]
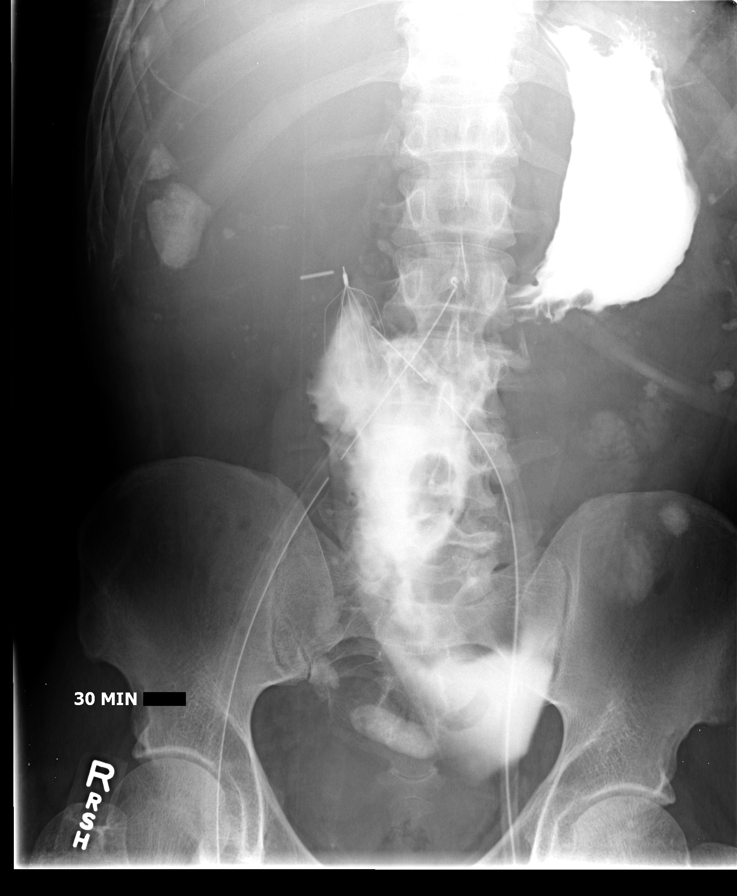

[4 of 4 positions shown; findings below may reference images not displayed]

FINDINGS: On the preliminary examination inferior vena cava filter
is seen to the right of L2-L3.

Contrast was injected through a gastrostomy tube filling the
stomach with contrast.  At the end of the injection an abdominal
radiographic examination was obtained.  Contrast is seen within the
stomach.  There appears to be contrast within the proximal duodenum
with contrast already collecting within the patient's wound.
Images were obtained at 15 minutes and 30 minutes after injection
of contrast.  There is further accumulation of contrast within the
patient's area of the wound.  The 15 and 30-minute images
demonstrate only a very tiny amount of contrast passing into the
jejunum.
IMPRESSION: Contrast appears to be passing from the area of the duodenum into
the patient's wound.  Only a small amount of contrast passed into
the jejunum.

## 2009-10-28 ENCOUNTER — Emergency Department (HOSPITAL_COMMUNITY): Admission: EM | Admit: 2009-10-28 | Discharge: 2009-10-28 | Payer: Self-pay | Admitting: Emergency Medicine
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
## 2009-11-14 ENCOUNTER — Emergency Department (HOSPITAL_COMMUNITY): Admission: EM | Admit: 2009-11-14 | Discharge: 2009-11-15 | Payer: Self-pay | Admitting: Emergency Medicine

## 2009-11-20 ENCOUNTER — Emergency Department (HOSPITAL_COMMUNITY): Admission: EM | Admit: 2009-11-20 | Discharge: 2009-11-20 | Payer: Self-pay | Admitting: Emergency Medicine

## 2009-11-21 ENCOUNTER — Emergency Department (HOSPITAL_COMMUNITY): Admission: EM | Admit: 2009-11-21 | Discharge: 2009-11-21 | Payer: Self-pay | Admitting: Emergency Medicine

## 2009-11-22 ENCOUNTER — Emergency Department (HOSPITAL_COMMUNITY): Admission: EM | Admit: 2009-11-22 | Discharge: 2009-11-23 | Payer: Self-pay | Admitting: Emergency Medicine

## 2009-11-24 ENCOUNTER — Emergency Department (HOSPITAL_COMMUNITY): Admission: EM | Admit: 2009-11-24 | Discharge: 2009-11-24 | Payer: Self-pay | Admitting: Emergency Medicine

## 2009-12-02 ENCOUNTER — Emergency Department (HOSPITAL_COMMUNITY): Admission: EM | Admit: 2009-12-02 | Discharge: 2009-12-02 | Payer: Self-pay | Admitting: Emergency Medicine

## 2009-12-20 ENCOUNTER — Emergency Department (HOSPITAL_COMMUNITY): Admission: EM | Admit: 2009-12-20 | Discharge: 2009-12-20 | Payer: Self-pay | Admitting: Emergency Medicine

## 2010-01-02 IMAGING — CR DG UGI W/ SMALL BOWEL
2 series · 2 of 2 positions shown · IV contrast (agent unspecified)
Comparison: 08/01/2009

CLINICAL DATA: Evaluate for enterocutaneous fistula

UPPER GI W/ SMALL BOWEL HIGH DENSITY
TECHNIQUE: Upper GI series performed with high density barium and
effervescent agent. Thin barium also used.  Subsequently, serial
images of the small bowel were obtained including spot views of the
terminal ileum.
Fluoroscopy Time:
Contrast: Water soluble contrast injected through gastrostomy tube.

[view not recorded (1 of 2)]
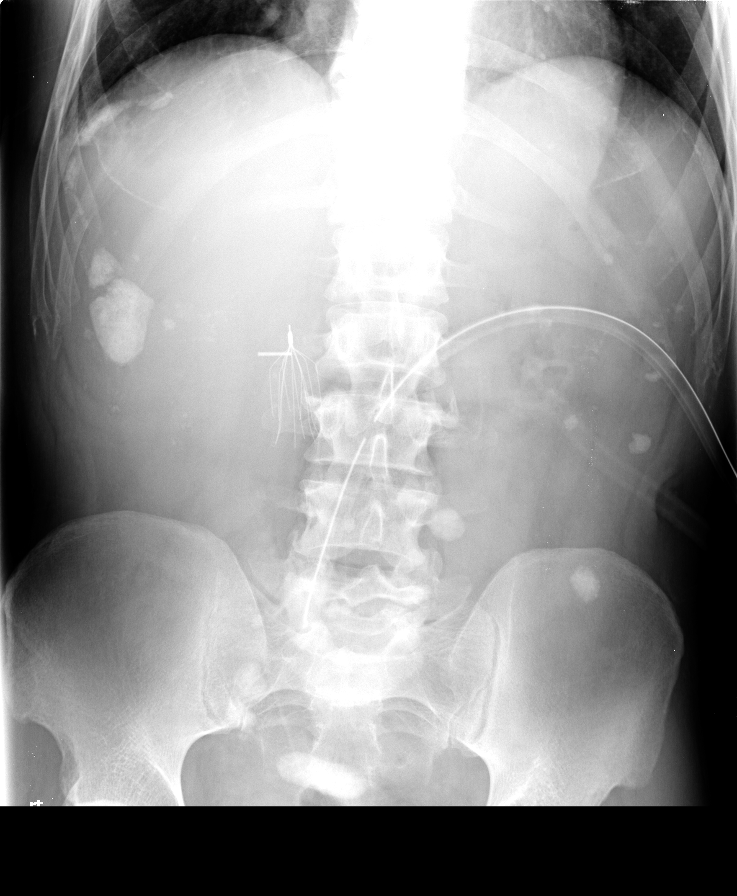

[view not recorded (2 of 2)]
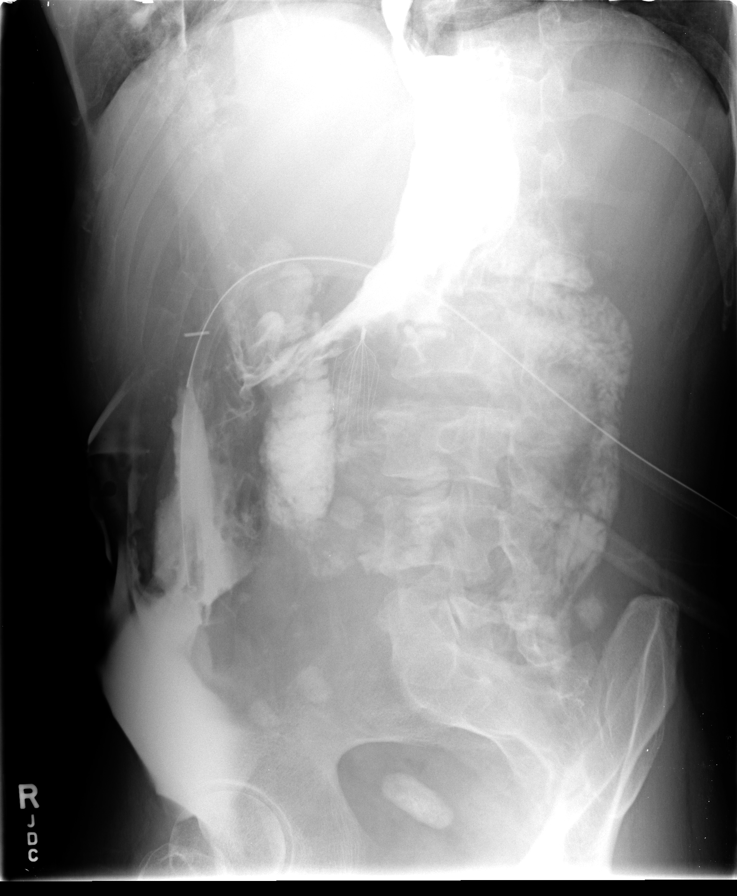

[2 of 2 positions shown; findings below may reference images not displayed]

FINDINGS: Preliminary film of the abdomen shows a nonspecific
nonobstructive bowel gas pattern.  A IVC filter in place is noted.
Contrast   injected through the gastric tube is noted filling the
stomach.  Abundant spontaneous gastroesophageal reflux is noted.

Contrast is noted exiting the stomach in the proximal duodenum and
jejunum. As on previous examination contrast is noted passing from
the duodenum in the patient's midline abdominal wound and from
there in the drainage bag.  Findings are consistent with persistent
enterocutaneous fistula.  Only small amount of contrast is noted
progressing in jejunum
IMPRESSION: As on previous examination contrast is noted passing from the
duodenum in the patient's midline abdominal wound and from there in
the drainage bag.  Findings are consistent with persistent
enterocutaneous fistula.  Only small amount of contrast is noted
progressing in jejunum

## 2010-01-03 IMAGING — CR DG SINUS / FISTULA TRACT / ABSCESSOGRAM
1 series · 1 of 1 positions shown · non-contrast
Comparison: 10/07/2009 study

CLINICAL DATA: History given of enterocutaneous fistulas.

SINUS TRACT INJECTION/FISTULOGRAM
TECHNIQUE: A small catheter was placed into two percutaneous
fistulous tracts. A total of 13ml of 2mnipaque-M99were slowly
injected with approximately half of the contrast used for injection
of each fistulous tract. Spot film images were obtained.
Fluoroscopy Time: 1.0 minutes.

[view not recorded]
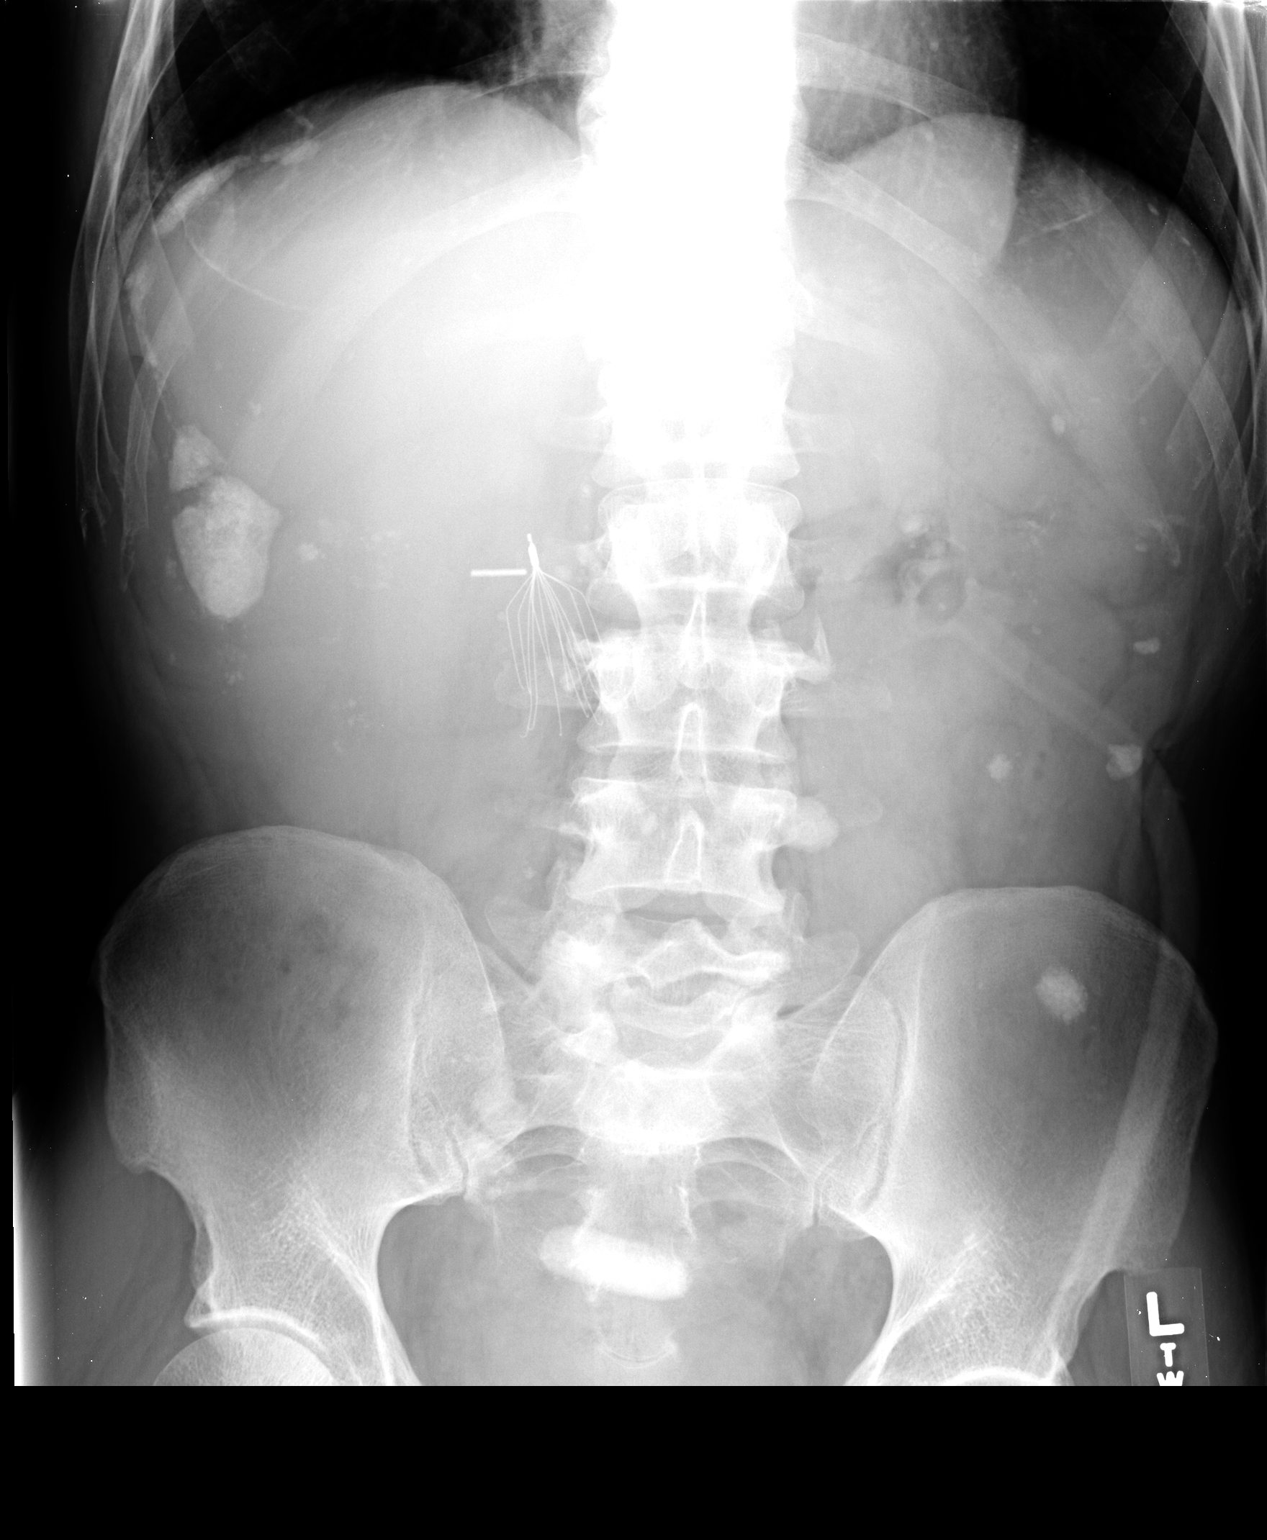

[1 of 1 positions shown; findings below may reference images not displayed]

FINDINGS: There are two fistulous tracts which are draining in the
patient's open wound.

When the more inferior of the two fistulous tracts was injected
with water soluble contrast it was seen to connect to the proximal
jejunum.

When the more superior of the two fistulous tracts was injected
with water soluble contrast it was seen to connect to the distal
gastric area near junction with duodenum with the contrast passing
into the stomach. It is possible that the fistula connects to the
duodenum but since the flow was into the stomach it most likely
connects to the stomach .
IMPRESSION: The more inferior of the enterocutaneous fistulas connects to the
proximal jejunum.

When the more superior of the enterocutaneous fistulas was injected
with contrast, the contrast enters the distal stomach.
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
## 2010-01-23 IMAGING — XA IR REPLC DUODEN/JEJUNO TUBE PERCUT W/FLUORO
2 series · 2 of 2 positions shown · non-contrast
Comparison: None available

CLINICAL DATA: Occluded jejunostomy through existing
enterocutaneous fistula, open abdominal wound

FLUOROSCOPIC REPLACEMENT JEJUNOSTOMY

[Series 1: fl - angio · 1 of 1 slices shown (1 of 2)]
[im 1/1]
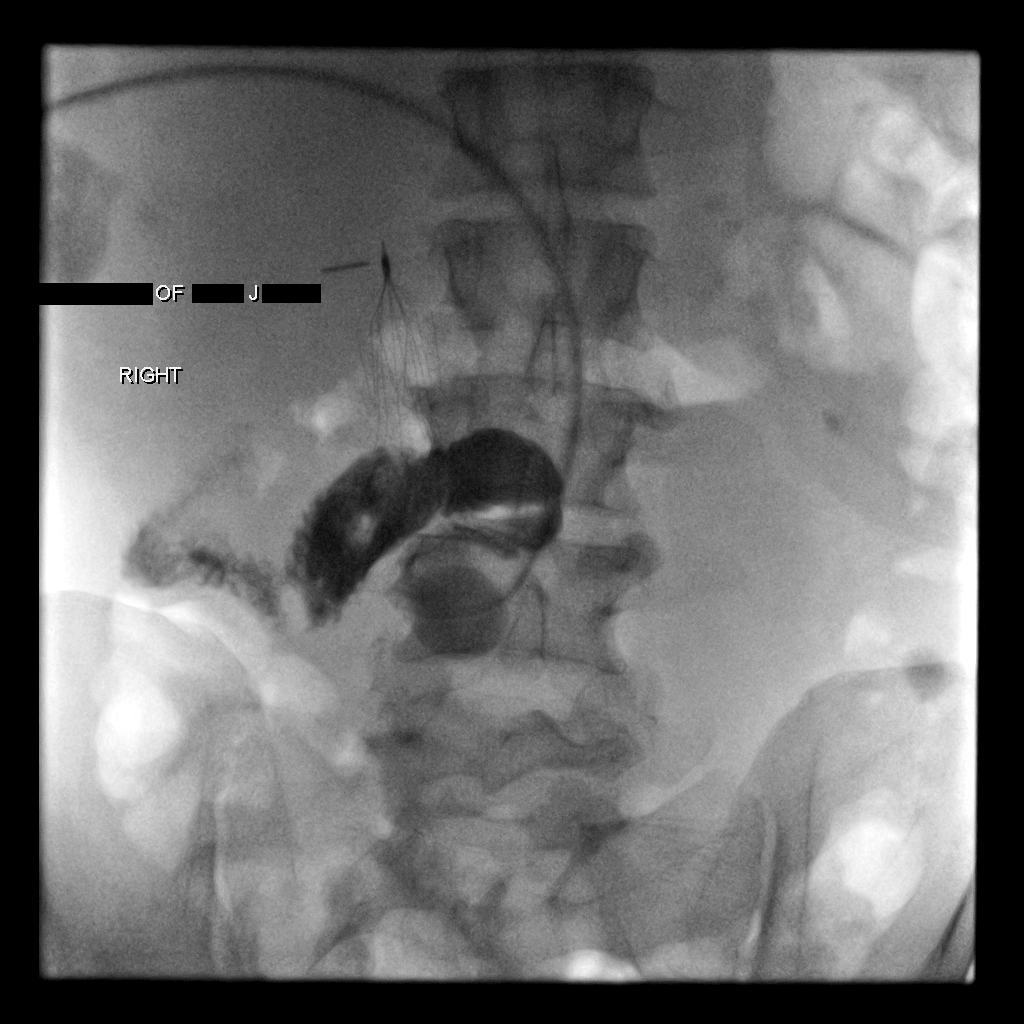

[Series 2: fl - angio · 1 of 1 slices shown (2 of 2)]
[im 1/1]
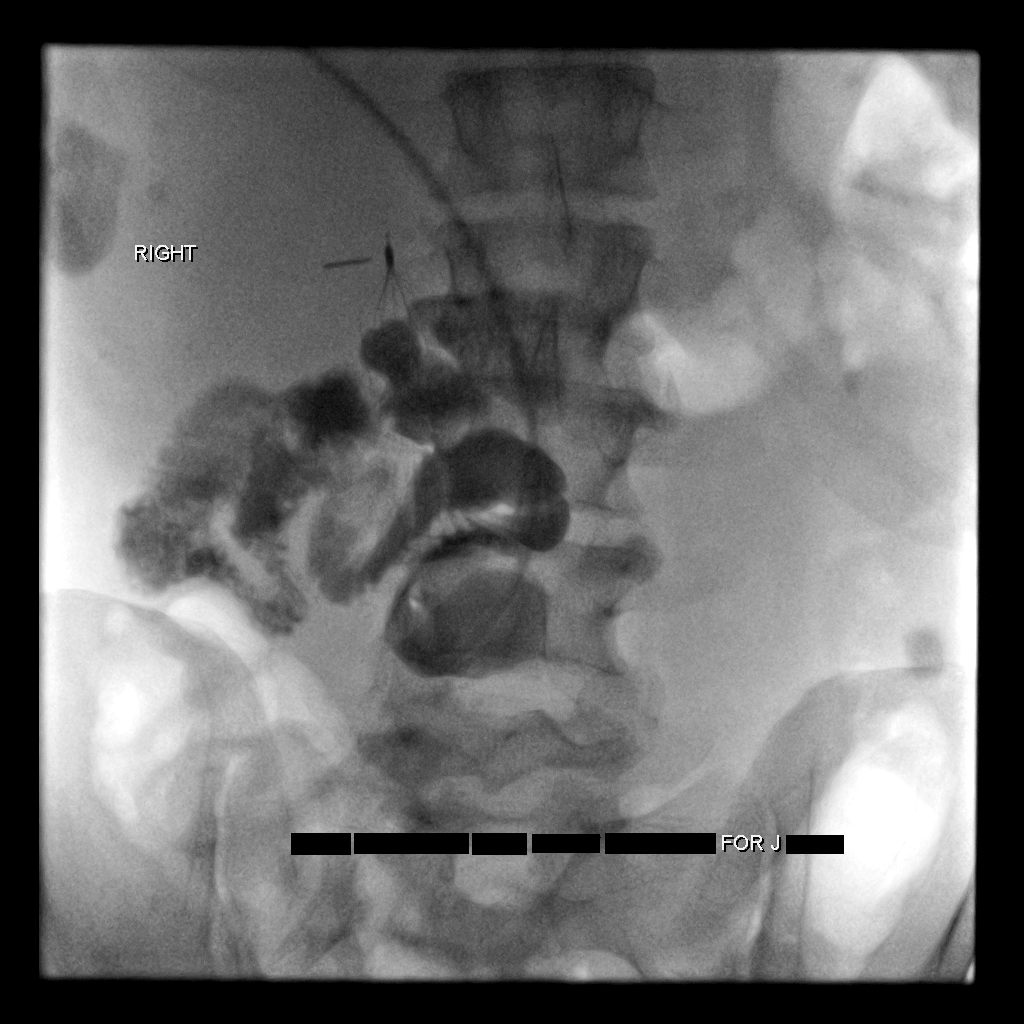

[2 of 2 positions shown; findings below may reference images not displayed]

FINDINGS: Informed consent obtained from the patient.

The existing occluded 16-French jejunostomy was cut and removed
from the percutaneous tract.  A new 16-French balloon retention
jejunostomy was advanced into the percutaneous tract approximately
5 cm.  Balloon tip was inflated with 8 ml saline containing 1 ml
contrast for visualization.  Contrast injection under fluoroscopy
confirms position in the jejunum.  No evidence of obstruction.
IMPRESSION: Fluoroscopic replacement of a 16-French jejunostomy

## 2010-02-15 IMAGING — CR DG ABDOMEN 1V
1 series · 1 of 1 positions shown · non-contrast
Comparison: 11/15/2009

CLINICAL DATA: Replacement of feeding tube.

ABDOMEN - 1 VIEW

[t abdomen supine]
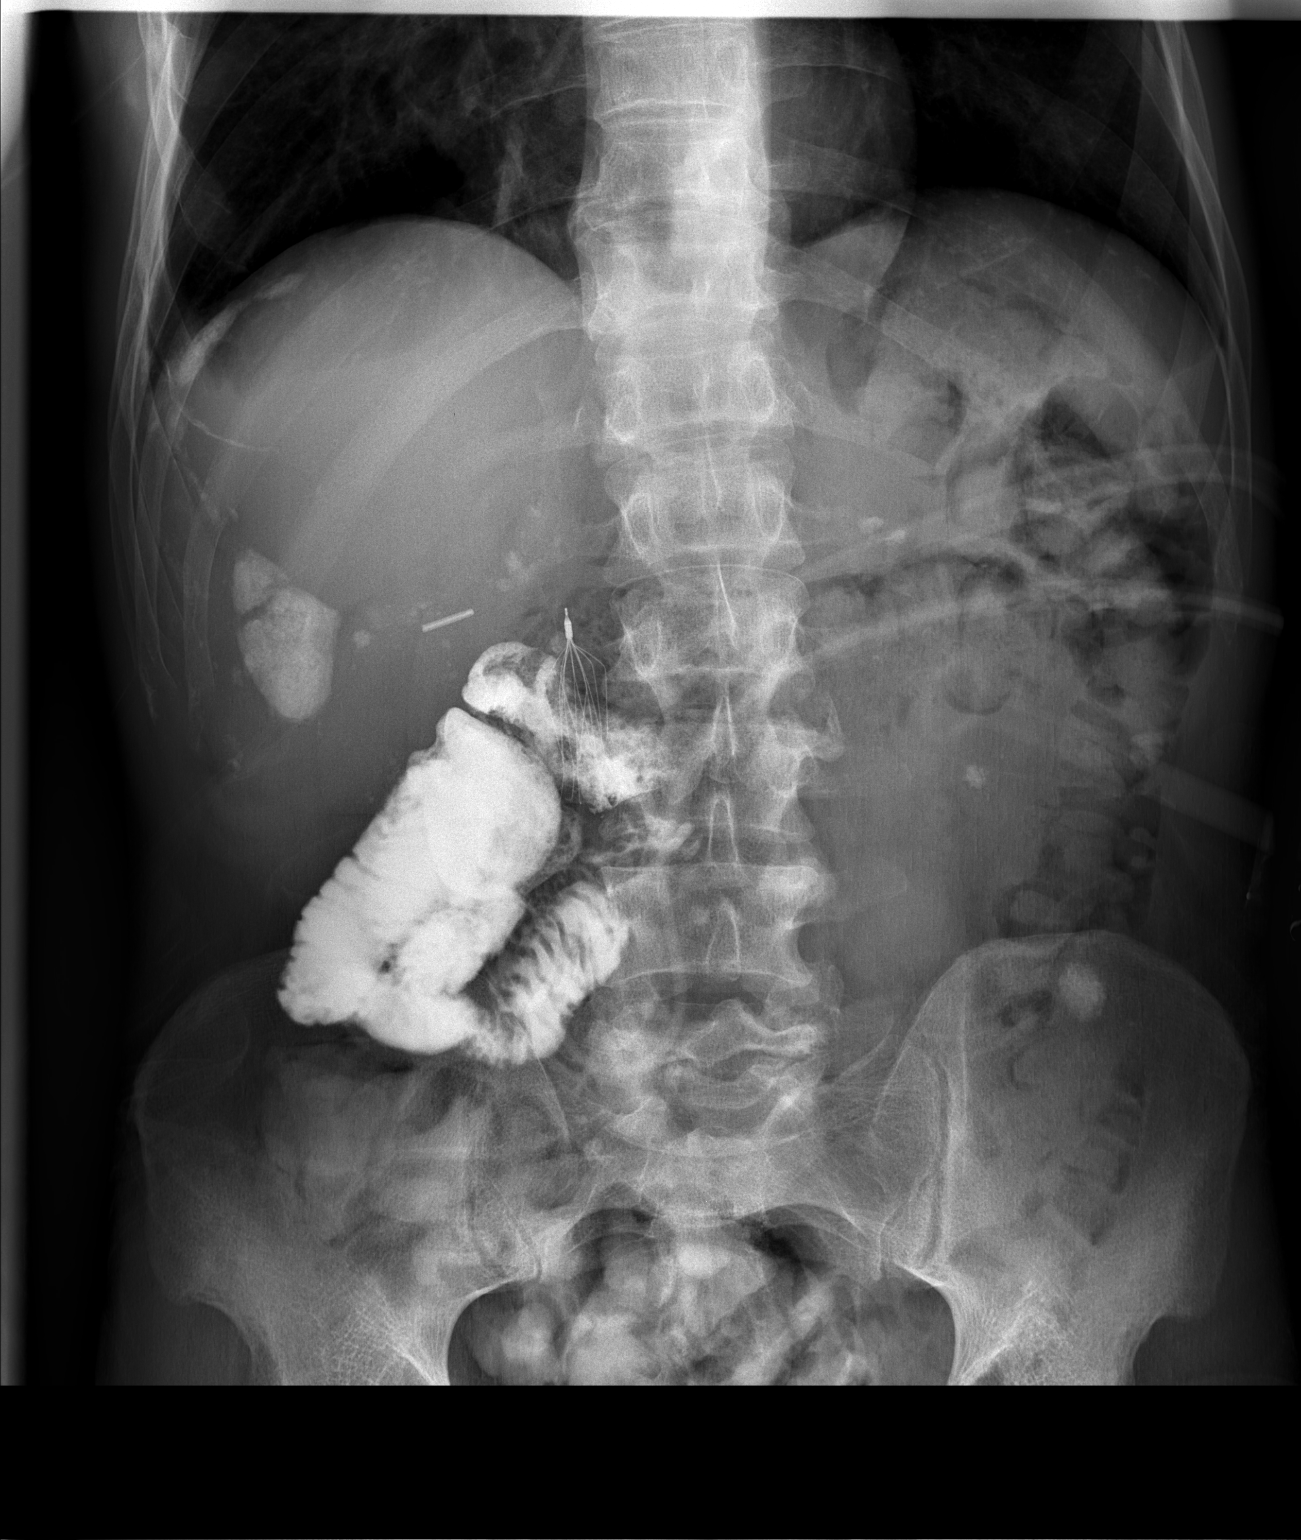

[1 of 1 positions shown; findings below may reference images not displayed]

FINDINGS: The PEG tube appears to be in the antral region of the
stomach.  There is contrast in the distal stomach and in the
proximal small bowel.  There is some contrast in the colon from a
previous contrasted study.  No obvious extravasation of contrast.
The bowel gas pattern is unremarkable.  The lung bases are clear.
IMPRESSION: The PEG tube appears to be in good position without complicating
features.

## 2010-02-19 IMAGING — CR DG ABDOMEN 1V
1 series · 1 of 1 positions shown · non-contrast
Comparison: Abdominal radiograph performed 11/20/2009

CLINICAL DATA: J tube replacement.

ABDOMEN - 1 VIEW

[t abdomen supine]
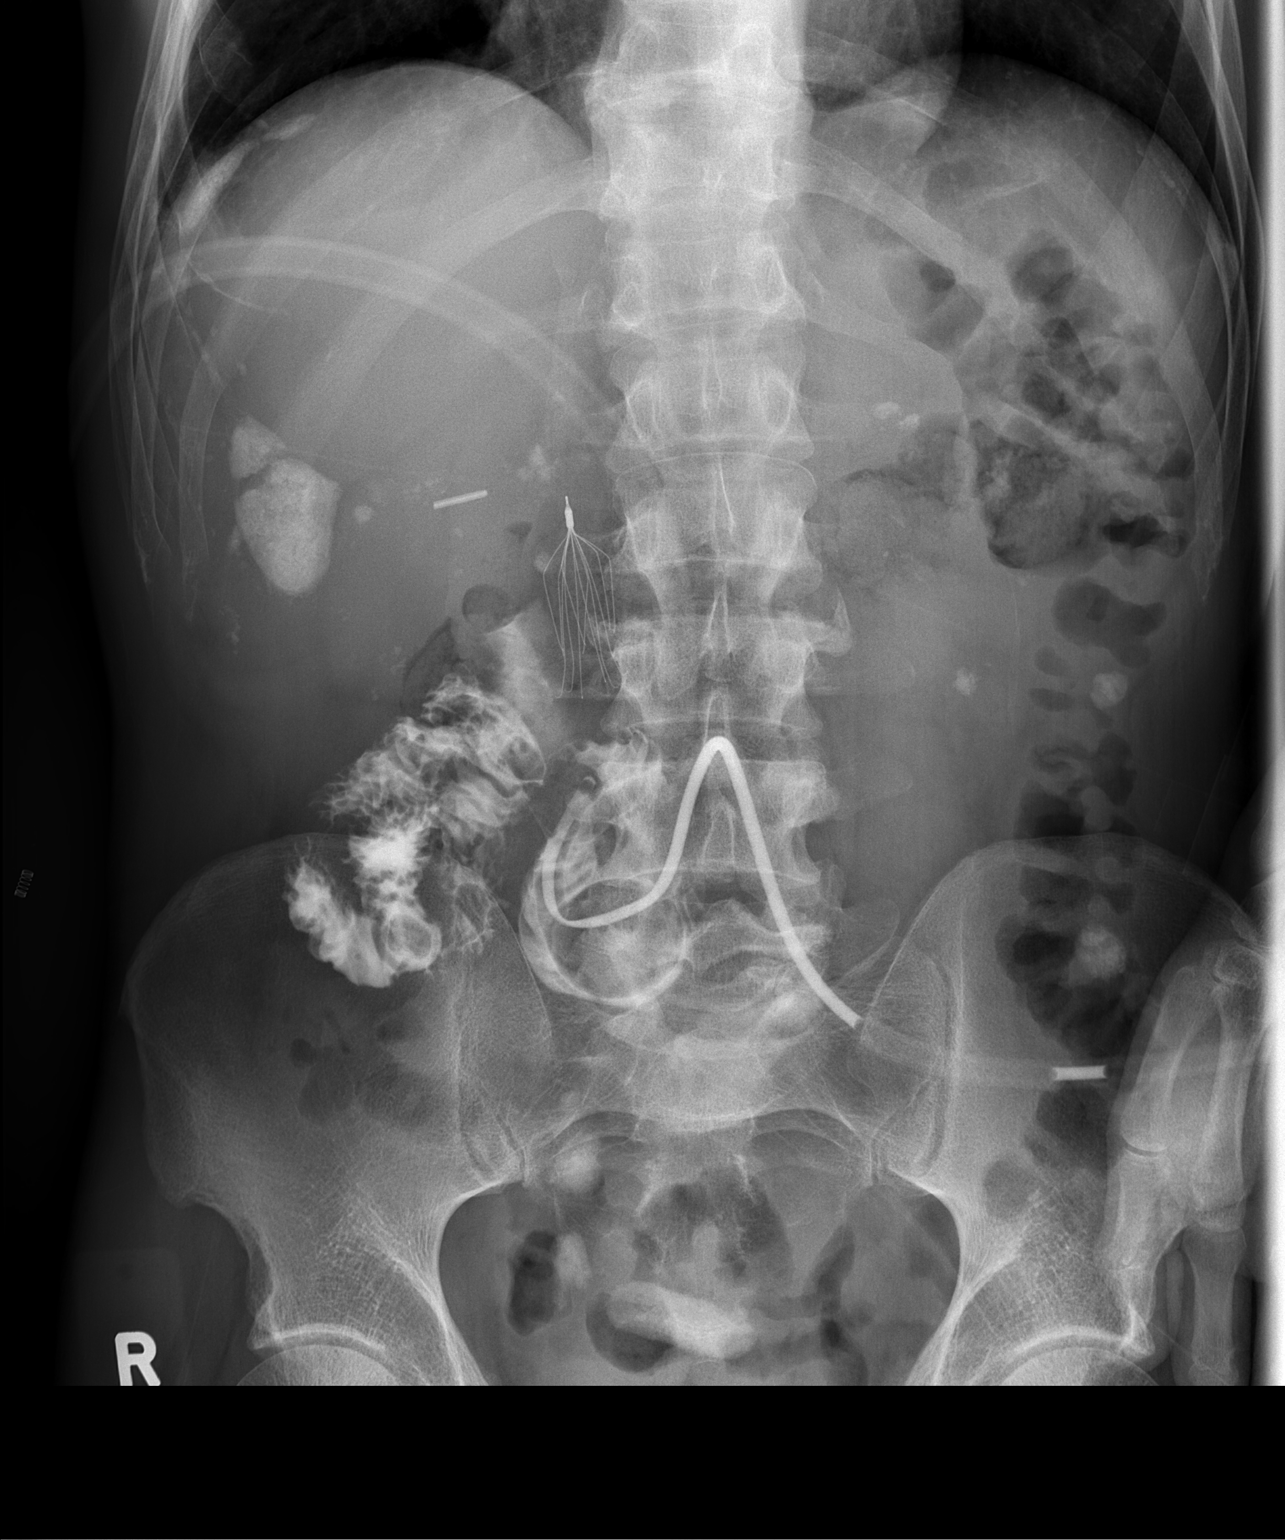

[1 of 1 positions shown; findings below may reference images not displayed]

FINDINGS: The patient's direct J tube is noted within the jejunum;
the 10 mL of injected Gastrografin contrast is seen filling the
jejunum.  The balloon of the J tube is overly distended, measuring
3.7 cm; fluid was subsequently withdrawn from the balloon.

The visualized bowel gas pattern is unremarkable.  An IVC filter is
noted.  A clip is seen at the right upper quadrant.  Scattered
dense foci of calcification are again noted within the abdomen.
IMPRESSION: Direct J tube noted within the jejunum, in good position.  The
balloon of the J tube is overly distended; fluid was subsequently
withdrawn from the balloon.

Findings were discussed with Dr. Tsehay Madsen at [DATE] on
11/24/2009.

## 2010-06-15 ENCOUNTER — Emergency Department (HOSPITAL_COMMUNITY): Admission: EM | Admit: 2010-06-15 | Discharge: 2010-06-15 | Payer: Self-pay | Admitting: Emergency Medicine

## 2010-12-27 ENCOUNTER — Encounter: Payer: Self-pay | Admitting: Internal Medicine

## 2010-12-28 ENCOUNTER — Encounter: Payer: Self-pay | Admitting: Emergency Medicine

## 2011-02-22 LAB — DIFFERENTIAL
Basophils Absolute: 0.1 10*3/uL (ref 0.0–0.1)
Eosinophils Absolute: 0.1 10*3/uL (ref 0.0–0.7)
Eosinophils Relative: 1 % (ref 0–5)
Lymphocytes Relative: 25 % (ref 12–46)
Lymphs Abs: 2.2 10*3/uL (ref 0.7–4.0)
Neutrophils Relative %: 62 % (ref 43–77)

## 2011-02-22 LAB — CBC
HCT: 41 % (ref 39.0–52.0)
Hemoglobin: 13.7 g/dL (ref 13.0–17.0)
MCH: 28.9 pg (ref 26.0–34.0)
MCHC: 33.3 g/dL (ref 30.0–36.0)
MCV: 86.7 fL (ref 78.0–100.0)
RBC: 4.73 MIL/uL (ref 4.22–5.81)

## 2011-02-22 LAB — URINALYSIS, ROUTINE W REFLEX MICROSCOPIC
Bilirubin Urine: NEGATIVE
Glucose, UA: NEGATIVE mg/dL
Hgb urine dipstick: NEGATIVE
Ketones, ur: NEGATIVE mg/dL
Specific Gravity, Urine: 1.025 (ref 1.005–1.030)
pH: 6 (ref 5.0–8.0)

## 2011-02-22 LAB — COMPREHENSIVE METABOLIC PANEL
AST: 13 U/L (ref 0–37)
CO2: 27 mEq/L (ref 19–32)
Calcium: 8.9 mg/dL (ref 8.4–10.5)
Chloride: 102 mEq/L (ref 96–112)
Creatinine, Ser: 0.65 mg/dL (ref 0.4–1.5)
GFR calc Af Amer: >60 mL/min (ref >60)
GFR calc non Af Amer: >60 mL/min (ref >60)
Glucose, Bld: 97 mg/dL (ref 70–99)
Total Bilirubin: 0.6 mg/dL (ref 0.3–1.2)

## 2011-03-11 LAB — GLUCOSE, CAPILLARY
Glucose-Capillary: 100 mg/dL — ABNORMAL HIGH (ref 70–99)
Glucose-Capillary: 102 mg/dL — ABNORMAL HIGH (ref 70–99)
Glucose-Capillary: 105 mg/dL — ABNORMAL HIGH (ref 70–99)
Glucose-Capillary: 107 mg/dL — ABNORMAL HIGH (ref 70–99)
Glucose-Capillary: 107 mg/dL — ABNORMAL HIGH (ref 70–99)
Glucose-Capillary: 117 mg/dL — ABNORMAL HIGH (ref 70–99)
Glucose-Capillary: 147 mg/dL — ABNORMAL HIGH (ref 70–99)
Glucose-Capillary: 150 mg/dL — ABNORMAL HIGH (ref 70–99)
Glucose-Capillary: 156 mg/dL — ABNORMAL HIGH (ref 70–99)
Glucose-Capillary: 160 mg/dL — ABNORMAL HIGH (ref 70–99)
Glucose-Capillary: 85 mg/dL (ref 70–99)
Glucose-Capillary: 88 mg/dL (ref 70–99)
Glucose-Capillary: 89 mg/dL (ref 70–99)
Glucose-Capillary: 90 mg/dL (ref 70–99)
Glucose-Capillary: 92 mg/dL (ref 70–99)
Glucose-Capillary: 95 mg/dL (ref 70–99)
Glucose-Capillary: 95 mg/dL (ref 70–99)

## 2011-03-11 LAB — BASIC METABOLIC PANEL
BUN: 26 mg/dL — ABNORMAL HIGH (ref 6–23)
CO2: 29 mEq/L (ref 19–32)
Chloride: 108 mEq/L (ref 96–112)
Creatinine, Ser: 0.45 mg/dL (ref 0.4–1.5)
Glucose, Bld: 53 mg/dL — ABNORMAL LOW (ref 70–99)
Potassium: 4.3 mEq/L (ref 3.5–5.1)

## 2011-03-11 LAB — COMPREHENSIVE METABOLIC PANEL
ALT: 106 U/L — ABNORMAL HIGH (ref 0–53)
ALT: 96 U/L — ABNORMAL HIGH (ref 0–53)
AST: 46 U/L — ABNORMAL HIGH (ref 0–37)
AST: 56 U/L — ABNORMAL HIGH (ref 0–37)
Albumin: 3.1 g/dL — ABNORMAL LOW (ref 3.5–5.2)
Albumin: 3.2 g/dL — ABNORMAL LOW (ref 3.5–5.2)
BUN: 23 mg/dL (ref 6–23)
CO2: 30 mEq/L (ref 19–32)
Calcium: 8.8 mg/dL (ref 8.4–10.5)
Calcium: 8.9 mg/dL (ref 8.4–10.5)
Calcium: 9.1 mg/dL (ref 8.4–10.5)
Chloride: 106 mEq/L (ref 96–112)
Creatinine, Ser: 0.44 mg/dL (ref 0.4–1.5)
GFR calc Af Amer: >60 mL/min (ref >60)
GFR calc Af Amer: >60 mL/min (ref >60)
GFR calc non Af Amer: >60 mL/min (ref >60)
Glucose, Bld: 63 mg/dL — ABNORMAL LOW (ref 70–99)
Sodium: 141 mEq/L (ref 135–145)
Sodium: 141 mEq/L (ref 135–145)
Total Protein: 5.8 g/dL — ABNORMAL LOW (ref 6.0–8.3)
Total Protein: 6.2 g/dL (ref 6.0–8.3)

## 2011-03-11 LAB — PHOSPHORUS
Phosphorus: 3.8 mg/dL (ref 2.3–4.6)
Phosphorus: 3.8 mg/dL (ref 2.3–4.6)

## 2011-03-11 LAB — DIFFERENTIAL
Eosinophils Absolute: 0.2 10*3/uL (ref 0.0–0.7)
Eosinophils Relative: 3 % (ref 0–5)
Lymphs Abs: 1.9 10*3/uL (ref 0.7–4.0)
Monocytes Relative: 12 % (ref 3–12)

## 2011-03-11 LAB — CBC
Platelets: 195 10*3/uL (ref 150–400)
RBC: 4.17 MIL/uL — ABNORMAL LOW (ref 4.22–5.81)
RDW: 14.9 % (ref 11.5–15.5)
WBC: 7.7 10*3/uL (ref 4.0–10.5)

## 2011-03-11 LAB — MAGNESIUM: Magnesium: 2 mg/dL (ref 1.5–2.5)

## 2011-03-11 LAB — TRIGLYCERIDES: Triglycerides: 34 mg/dL (ref <150)

## 2011-03-11 LAB — PREALBUMIN: Prealbumin: 15.6 mg/dL — ABNORMAL LOW (ref 18.0–45.0)

## 2011-03-12 LAB — COMPREHENSIVE METABOLIC PANEL
ALT: 129 U/L — ABNORMAL HIGH (ref 0–53)
ALT: 130 U/L — ABNORMAL HIGH (ref 0–53)
ALT: 164 U/L — ABNORMAL HIGH (ref 0–53)
ALT: 237 U/L — ABNORMAL HIGH (ref 0–53)
ALT: 130 U/L — ABNORMAL HIGH (ref 0–53)
ALT: 63 U/L — ABNORMAL HIGH (ref 0–53)
AST: 128 U/L — ABNORMAL HIGH (ref 0–37)
AST: 51 U/L — ABNORMAL HIGH (ref 0–37)
Albumin: 3.1 g/dL — ABNORMAL LOW (ref 3.5–5.2)
Albumin: 3.6 g/dL (ref 3.5–5.2)
Alkaline Phosphatase: 191 U/L — ABNORMAL HIGH (ref 39–117)
Alkaline Phosphatase: 204 U/L — ABNORMAL HIGH (ref 39–117)
Alkaline Phosphatase: 214 U/L — ABNORMAL HIGH (ref 39–117)
Alkaline Phosphatase: 167 U/L — ABNORMAL HIGH (ref 39–117)
Alkaline Phosphatase: 180 U/L — ABNORMAL HIGH (ref 39–117)
Alkaline Phosphatase: 185 U/L — ABNORMAL HIGH (ref 39–117)
BUN: 23 mg/dL (ref 6–23)
BUN: 29 mg/dL — ABNORMAL HIGH (ref 6–23)
CO2: 28 mEq/L (ref 19–32)
CO2: 30 mEq/L (ref 19–32)
CO2: 34 mEq/L — ABNORMAL HIGH (ref 19–32)
Calcium: 8.8 mg/dL (ref 8.4–10.5)
Calcium: 8.8 mg/dL (ref 8.4–10.5)
Calcium: 9.1 mg/dL (ref 8.4–10.5)
Chloride: 103 mEq/L (ref 96–112)
Chloride: 107 mEq/L (ref 96–112)
Chloride: 103 mEq/L (ref 96–112)
Chloride: 104 mEq/L (ref 96–112)
GFR calc Af Amer: >60 mL/min (ref >60)
GFR calc Af Amer: >60 mL/min (ref >60)
GFR calc non Af Amer: >60 mL/min (ref >60)
GFR calc non Af Amer: >60 mL/min (ref >60)
Glucose, Bld: 100 mg/dL — ABNORMAL HIGH (ref 70–99)
Glucose, Bld: 81 mg/dL (ref 70–99)
Glucose, Bld: 66 mg/dL — ABNORMAL LOW (ref 70–99)
Glucose, Bld: 76 mg/dL (ref 70–99)
Glucose, Bld: 86 mg/dL (ref 70–99)
Potassium: 4 mEq/L (ref 3.5–5.1)
Potassium: 4.2 mEq/L (ref 3.5–5.1)
Potassium: 4.2 mEq/L (ref 3.5–5.1)
Potassium: 4.2 mEq/L (ref 3.5–5.1)
Potassium: 4.4 mEq/L (ref 3.5–5.1)
Sodium: 134 mEq/L — ABNORMAL LOW (ref 135–145)
Sodium: 140 mEq/L (ref 135–145)
Sodium: 141 mEq/L (ref 135–145)
Sodium: 142 mEq/L (ref 135–145)
Sodium: 139 mEq/L (ref 135–145)
Sodium: 142 mEq/L (ref 135–145)
Total Bilirubin: 0.7 mg/dL (ref 0.3–1.2)
Total Bilirubin: 0.8 mg/dL (ref 0.3–1.2)
Total Bilirubin: 0.5 mg/dL (ref 0.3–1.2)
Total Bilirubin: 0.9 mg/dL (ref 0.3–1.2)
Total Protein: 5.5 g/dL — ABNORMAL LOW (ref 6.0–8.3)
Total Protein: 5.6 g/dL — ABNORMAL LOW (ref 6.0–8.3)
Total Protein: 5.9 g/dL — ABNORMAL LOW (ref 6.0–8.3)
Total Protein: 6.3 g/dL (ref 6.0–8.3)
Total Protein: 5.8 g/dL — ABNORMAL LOW (ref 6.0–8.3)

## 2011-03-12 LAB — MAGNESIUM
Magnesium: 2.1 mg/dL (ref 1.5–2.5)
Magnesium: 2.1 mg/dL (ref 1.5–2.5)
Magnesium: 2.1 mg/dL (ref 1.5–2.5)

## 2011-03-12 LAB — GLUCOSE, CAPILLARY
Glucose-Capillary: 100 mg/dL — ABNORMAL HIGH (ref 70–99)
Glucose-Capillary: 101 mg/dL — ABNORMAL HIGH (ref 70–99)
Glucose-Capillary: 105 mg/dL — ABNORMAL HIGH (ref 70–99)
Glucose-Capillary: 105 mg/dL — ABNORMAL HIGH (ref 70–99)
Glucose-Capillary: 105 mg/dL — ABNORMAL HIGH (ref 70–99)
Glucose-Capillary: 105 mg/dL — ABNORMAL HIGH (ref 70–99)
Glucose-Capillary: 106 mg/dL — ABNORMAL HIGH (ref 70–99)
Glucose-Capillary: 108 mg/dL — ABNORMAL HIGH (ref 70–99)
Glucose-Capillary: 109 mg/dL — ABNORMAL HIGH (ref 70–99)
Glucose-Capillary: 112 mg/dL — ABNORMAL HIGH (ref 70–99)
Glucose-Capillary: 112 mg/dL — ABNORMAL HIGH (ref 70–99)
Glucose-Capillary: 113 mg/dL — ABNORMAL HIGH (ref 70–99)
Glucose-Capillary: 134 mg/dL — ABNORMAL HIGH (ref 70–99)
Glucose-Capillary: 149 mg/dL — ABNORMAL HIGH (ref 70–99)
Glucose-Capillary: 150 mg/dL — ABNORMAL HIGH (ref 70–99)
Glucose-Capillary: 156 mg/dL — ABNORMAL HIGH (ref 70–99)
Glucose-Capillary: 161 mg/dL — ABNORMAL HIGH (ref 70–99)
Glucose-Capillary: 161 mg/dL — ABNORMAL HIGH (ref 70–99)
Glucose-Capillary: 177 mg/dL — ABNORMAL HIGH (ref 70–99)
Glucose-Capillary: 179 mg/dL — ABNORMAL HIGH (ref 70–99)
Glucose-Capillary: 82 mg/dL (ref 70–99)
Glucose-Capillary: 92 mg/dL (ref 70–99)
Glucose-Capillary: 95 mg/dL (ref 70–99)
Glucose-Capillary: 98 mg/dL (ref 70–99)
Glucose-Capillary: 98 mg/dL (ref 70–99)
Glucose-Capillary: 101 mg/dL — ABNORMAL HIGH (ref 70–99)
Glucose-Capillary: 103 mg/dL — ABNORMAL HIGH (ref 70–99)
Glucose-Capillary: 105 mg/dL — ABNORMAL HIGH (ref 70–99)
Glucose-Capillary: 113 mg/dL — ABNORMAL HIGH (ref 70–99)
Glucose-Capillary: 128 mg/dL — ABNORMAL HIGH (ref 70–99)
Glucose-Capillary: 139 mg/dL — ABNORMAL HIGH (ref 70–99)
Glucose-Capillary: 159 mg/dL — ABNORMAL HIGH (ref 70–99)
Glucose-Capillary: 162 mg/dL — ABNORMAL HIGH (ref 70–99)
Glucose-Capillary: 164 mg/dL — ABNORMAL HIGH (ref 70–99)
Glucose-Capillary: 168 mg/dL — ABNORMAL HIGH (ref 70–99)
Glucose-Capillary: 183 mg/dL — ABNORMAL HIGH (ref 70–99)
Glucose-Capillary: 88 mg/dL (ref 70–99)
Glucose-Capillary: 89 mg/dL (ref 70–99)
Glucose-Capillary: 94 mg/dL (ref 70–99)
Glucose-Capillary: 96 mg/dL (ref 70–99)
Glucose-Capillary: 96 mg/dL (ref 70–99)
Glucose-Capillary: 96 mg/dL (ref 70–99)
Glucose-Capillary: 98 mg/dL (ref 70–99)

## 2011-03-12 LAB — TRIGLYCERIDES
Triglycerides: 32 mg/dL (ref <150)
Triglycerides: 43 mg/dL (ref <150)

## 2011-03-12 LAB — CBC
HCT: 34.3 % — ABNORMAL LOW (ref 39.0–52.0)
Hemoglobin: 11.4 g/dL — ABNORMAL LOW (ref 13.0–17.0)
Hemoglobin: 11.9 g/dL — ABNORMAL LOW (ref 13.0–17.0)
RBC: 3.85 MIL/uL — ABNORMAL LOW (ref 4.22–5.81)
RBC: 4.06 MIL/uL — ABNORMAL LOW (ref 4.22–5.81)
RDW: 14.4 % (ref 11.5–15.5)
RDW: 14.6 % (ref 11.5–15.5)
WBC: 5.6 10*3/uL (ref 4.0–10.5)
WBC: 6.1 10*3/uL (ref 4.0–10.5)

## 2011-03-12 LAB — BASIC METABOLIC PANEL
BUN: 20 mg/dL (ref 6–23)
BUN: 23 mg/dL (ref 6–23)
CO2: 31 mEq/L (ref 19–32)
Calcium: 8.7 mg/dL (ref 8.4–10.5)
Chloride: 104 mEq/L (ref 96–112)
Chloride: 108 mEq/L (ref 96–112)
Creatinine, Ser: 0.36 mg/dL — ABNORMAL LOW (ref 0.4–1.5)
Creatinine, Ser: 0.5 mg/dL (ref 0.4–1.5)
GFR calc Af Amer: >60 mL/min (ref >60)
GFR calc non Af Amer: >60 mL/min (ref >60)
Glucose, Bld: 84 mg/dL (ref 70–99)
Potassium: 4.1 mEq/L (ref 3.5–5.1)

## 2011-03-12 LAB — PREALBUMIN
Prealbumin: 13.3 mg/dL — ABNORMAL LOW (ref 18.0–45.0)
Prealbumin: 14.1 mg/dL — ABNORMAL LOW (ref 18.0–45.0)

## 2011-03-12 LAB — DIFFERENTIAL
Basophils Absolute: 0 10*3/uL (ref 0.0–0.1)
Basophils Relative: 1 % (ref 0–1)
Basophils Relative: 1 % (ref 0–1)
Eosinophils Absolute: 0.2 10*3/uL (ref 0.0–0.7)
Eosinophils Absolute: 0.3 10*3/uL (ref 0.0–0.7)
Monocytes Absolute: 0.7 10*3/uL (ref 0.1–1.0)
Monocytes Relative: 11 % (ref 3–12)
Neutro Abs: 2.8 10*3/uL (ref 1.7–7.7)
Neutrophils Relative %: 50 % (ref 43–77)
Neutrophils Relative %: 55 % (ref 43–77)

## 2011-03-12 LAB — PHOSPHORUS
Phosphorus: 3.5 mg/dL (ref 2.3–4.6)
Phosphorus: 3.5 mg/dL (ref 2.3–4.6)
Phosphorus: 4.1 mg/dL (ref 2.3–4.6)

## 2011-03-13 LAB — GLUCOSE, CAPILLARY
Glucose-Capillary: 103 mg/dL — ABNORMAL HIGH (ref 70–99)
Glucose-Capillary: 105 mg/dL — ABNORMAL HIGH (ref 70–99)
Glucose-Capillary: 108 mg/dL — ABNORMAL HIGH (ref 70–99)
Glucose-Capillary: 110 mg/dL — ABNORMAL HIGH (ref 70–99)
Glucose-Capillary: 111 mg/dL — ABNORMAL HIGH (ref 70–99)
Glucose-Capillary: 111 mg/dL — ABNORMAL HIGH (ref 70–99)
Glucose-Capillary: 113 mg/dL — ABNORMAL HIGH (ref 70–99)
Glucose-Capillary: 125 mg/dL — ABNORMAL HIGH (ref 70–99)
Glucose-Capillary: 131 mg/dL — ABNORMAL HIGH (ref 70–99)
Glucose-Capillary: 134 mg/dL — ABNORMAL HIGH (ref 70–99)
Glucose-Capillary: 145 mg/dL — ABNORMAL HIGH (ref 70–99)
Glucose-Capillary: 153 mg/dL — ABNORMAL HIGH (ref 70–99)
Glucose-Capillary: 157 mg/dL — ABNORMAL HIGH (ref 70–99)
Glucose-Capillary: 162 mg/dL — ABNORMAL HIGH (ref 70–99)
Glucose-Capillary: 168 mg/dL — ABNORMAL HIGH (ref 70–99)
Glucose-Capillary: 181 mg/dL — ABNORMAL HIGH (ref 70–99)
Glucose-Capillary: 197 mg/dL — ABNORMAL HIGH (ref 70–99)
Glucose-Capillary: 53 mg/dL — ABNORMAL LOW (ref 70–99)
Glucose-Capillary: 89 mg/dL (ref 70–99)
Glucose-Capillary: 91 mg/dL (ref 70–99)
Glucose-Capillary: 92 mg/dL (ref 70–99)
Glucose-Capillary: 95 mg/dL (ref 70–99)
Glucose-Capillary: 98 mg/dL (ref 70–99)
Glucose-Capillary: 98 mg/dL (ref 70–99)
Glucose-Capillary: 100 mg/dL — ABNORMAL HIGH (ref 70–99)
Glucose-Capillary: 101 mg/dL — ABNORMAL HIGH (ref 70–99)
Glucose-Capillary: 101 mg/dL — ABNORMAL HIGH (ref 70–99)
Glucose-Capillary: 103 mg/dL — ABNORMAL HIGH (ref 70–99)
Glucose-Capillary: 107 mg/dL — ABNORMAL HIGH (ref 70–99)
Glucose-Capillary: 108 mg/dL — ABNORMAL HIGH (ref 70–99)
Glucose-Capillary: 109 mg/dL — ABNORMAL HIGH (ref 70–99)
Glucose-Capillary: 109 mg/dL — ABNORMAL HIGH (ref 70–99)
Glucose-Capillary: 111 mg/dL — ABNORMAL HIGH (ref 70–99)
Glucose-Capillary: 119 mg/dL — ABNORMAL HIGH (ref 70–99)
Glucose-Capillary: 120 mg/dL — ABNORMAL HIGH (ref 70–99)
Glucose-Capillary: 120 mg/dL — ABNORMAL HIGH (ref 70–99)
Glucose-Capillary: 123 mg/dL — ABNORMAL HIGH (ref 70–99)
Glucose-Capillary: 145 mg/dL — ABNORMAL HIGH (ref 70–99)
Glucose-Capillary: 147 mg/dL — ABNORMAL HIGH (ref 70–99)
Glucose-Capillary: 152 mg/dL — ABNORMAL HIGH (ref 70–99)
Glucose-Capillary: 155 mg/dL — ABNORMAL HIGH (ref 70–99)
Glucose-Capillary: 161 mg/dL — ABNORMAL HIGH (ref 70–99)
Glucose-Capillary: 167 mg/dL — ABNORMAL HIGH (ref 70–99)
Glucose-Capillary: 183 mg/dL — ABNORMAL HIGH (ref 70–99)
Glucose-Capillary: 94 mg/dL (ref 70–99)
Glucose-Capillary: 96 mg/dL (ref 70–99)
Glucose-Capillary: 96 mg/dL (ref 70–99)

## 2011-03-13 LAB — MAGNESIUM
Magnesium: 2.2 mg/dL (ref 1.5–2.5)
Magnesium: 1.9 mg/dL (ref 1.5–2.5)
Magnesium: 2.2 mg/dL (ref 1.5–2.5)

## 2011-03-13 LAB — PHOSPHORUS
Phosphorus: 3.4 mg/dL (ref 2.3–4.6)
Phosphorus: 4.2 mg/dL (ref 2.3–4.6)

## 2011-03-13 LAB — URINALYSIS, MICROSCOPIC ONLY
Glucose, UA: NEGATIVE mg/dL
Ketones, ur: NEGATIVE mg/dL
Protein, ur: NEGATIVE mg/dL

## 2011-03-13 LAB — COMPREHENSIVE METABOLIC PANEL
ALT: 55 U/L — ABNORMAL HIGH (ref 0–53)
ALT: 63 U/L — ABNORMAL HIGH (ref 0–53)
ALT: 76 U/L — ABNORMAL HIGH (ref 0–53)
ALT: 78 U/L — ABNORMAL HIGH (ref 0–53)
ALT: 284 U/L — ABNORMAL HIGH (ref 0–53)
ALT: 60 U/L — ABNORMAL HIGH (ref 0–53)
ALT: 68 U/L — ABNORMAL HIGH (ref 0–53)
AST: 31 U/L (ref 0–37)
AST: 33 U/L (ref 0–37)
AST: 12 U/L (ref 0–37)
AST: 255 U/L — ABNORMAL HIGH (ref 0–37)
AST: 47 U/L — ABNORMAL HIGH (ref 0–37)
Albumin: 3.1 g/dL — ABNORMAL LOW (ref 3.5–5.2)
Albumin: 3.1 g/dL — ABNORMAL LOW (ref 3.5–5.2)
Albumin: 3.1 g/dL — ABNORMAL LOW (ref 3.5–5.2)
Albumin: 2.4 g/dL — ABNORMAL LOW (ref 3.5–5.2)
Alkaline Phosphatase: 172 U/L — ABNORMAL HIGH (ref 39–117)
Alkaline Phosphatase: 187 U/L — ABNORMAL HIGH (ref 39–117)
Alkaline Phosphatase: 259 U/L — ABNORMAL HIGH (ref 39–117)
Alkaline Phosphatase: 437 U/L — ABNORMAL HIGH (ref 39–117)
BUN: 21 mg/dL (ref 6–23)
BUN: 26 mg/dL — ABNORMAL HIGH (ref 6–23)
BUN: 26 mg/dL — ABNORMAL HIGH (ref 6–23)
CO2: 30 mEq/L (ref 19–32)
CO2: 32 mEq/L (ref 19–32)
CO2: 27 mEq/L (ref 19–32)
CO2: 31 mEq/L (ref 19–32)
CO2: 31 mEq/L (ref 19–32)
CO2: 31 mEq/L (ref 19–32)
Calcium: 8.9 mg/dL (ref 8.4–10.5)
Calcium: 8.9 mg/dL (ref 8.4–10.5)
Calcium: 8.6 mg/dL (ref 8.4–10.5)
Calcium: 8.9 mg/dL (ref 8.4–10.5)
Calcium: 9 mg/dL (ref 8.4–10.5)
Calcium: 9 mg/dL (ref 8.4–10.5)
Chloride: 104 mEq/L (ref 96–112)
Chloride: 104 mEq/L (ref 96–112)
Chloride: 100 mEq/L (ref 96–112)
Chloride: 100 mEq/L (ref 96–112)
Creatinine, Ser: 0.46 mg/dL (ref 0.4–1.5)
Creatinine, Ser: 0.37 mg/dL — ABNORMAL LOW (ref 0.4–1.5)
Creatinine, Ser: 0.41 mg/dL (ref 0.4–1.5)
Creatinine, Ser: 0.45 mg/dL (ref 0.4–1.5)
GFR calc Af Amer: >60 mL/min (ref >60)
GFR calc Af Amer: >60 mL/min (ref >60)
GFR calc Af Amer: >60 mL/min (ref >60)
GFR calc Af Amer: >60 mL/min (ref >60)
GFR calc Af Amer: >60 mL/min (ref >60)
GFR calc non Af Amer: >60 mL/min (ref >60)
GFR calc non Af Amer: >60 mL/min (ref >60)
GFR calc non Af Amer: >60 mL/min (ref >60)
GFR calc non Af Amer: >60 mL/min (ref >60)
GFR calc non Af Amer: >60 mL/min (ref >60)
GFR calc non Af Amer: >60 mL/min (ref >60)
Glucose, Bld: 82 mg/dL (ref 70–99)
Glucose, Bld: 93 mg/dL (ref 70–99)
Glucose, Bld: 128 mg/dL — ABNORMAL HIGH (ref 70–99)
Glucose, Bld: 88 mg/dL (ref 70–99)
Potassium: 4.1 mEq/L (ref 3.5–5.1)
Potassium: 4.2 mEq/L (ref 3.5–5.1)
Potassium: 4.4 mEq/L (ref 3.5–5.1)
Potassium: 4.4 mEq/L (ref 3.5–5.1)
Sodium: 135 mEq/L (ref 135–145)
Sodium: 138 mEq/L (ref 135–145)
Sodium: 139 mEq/L (ref 135–145)
Sodium: 142 mEq/L (ref 135–145)
Sodium: 134 mEq/L — ABNORMAL LOW (ref 135–145)
Sodium: 137 mEq/L (ref 135–145)
Sodium: 139 mEq/L (ref 135–145)
Total Bilirubin: 0.4 mg/dL (ref 0.3–1.2)
Total Bilirubin: 0.6 mg/dL (ref 0.3–1.2)
Total Bilirubin: 0.9 mg/dL (ref 0.3–1.2)
Total Protein: 5.8 g/dL — ABNORMAL LOW (ref 6.0–8.3)
Total Protein: 6 g/dL (ref 6.0–8.3)
Total Protein: 6 g/dL (ref 6.0–8.3)
Total Protein: 6.1 g/dL (ref 6.0–8.3)
Total Protein: 6.2 g/dL (ref 6.0–8.3)

## 2011-03-13 LAB — CBC
Hemoglobin: 11.4 g/dL — ABNORMAL LOW (ref 13.0–17.0)
Hemoglobin: 11.8 g/dL — ABNORMAL LOW (ref 13.0–17.0)
Hemoglobin: 11.3 g/dL — ABNORMAL LOW (ref 13.0–17.0)
Hemoglobin: 11.7 g/dL — ABNORMAL LOW (ref 13.0–17.0)
MCHC: 33.3 g/dL (ref 30.0–36.0)
MCHC: 32.8 g/dL (ref 30.0–36.0)
MCHC: 32.9 g/dL (ref 30.0–36.0)
MCHC: 33 g/dL (ref 30.0–36.0)
MCV: 89.9 fL (ref 78.0–100.0)
MCV: 90.3 fL (ref 78.0–100.0)
Platelets: 211 10*3/uL (ref 150–400)
RBC: 3.87 MIL/uL — ABNORMAL LOW (ref 4.22–5.81)
RBC: 3.82 MIL/uL — ABNORMAL LOW (ref 4.22–5.81)
RBC: 3.96 MIL/uL — ABNORMAL LOW (ref 4.22–5.81)
RDW: 15.4 % (ref 11.5–15.5)
RDW: 15.7 % — ABNORMAL HIGH (ref 11.5–15.5)
WBC: 8.8 10*3/uL (ref 4.0–10.5)

## 2011-03-13 LAB — DIFFERENTIAL
Eosinophils Absolute: 0.1 10*3/uL (ref 0.0–0.7)
Eosinophils Absolute: 0.1 10*3/uL (ref 0.0–0.7)
Eosinophils Relative: 2 % (ref 0–5)
Lymphocytes Relative: 20 % (ref 12–46)
Lymphocytes Relative: 25 % (ref 12–46)
Lymphs Abs: 1.7 10*3/uL (ref 0.7–4.0)
Lymphs Abs: 1.6 10*3/uL (ref 0.7–4.0)
Lymphs Abs: 1.7 10*3/uL (ref 0.7–4.0)
Monocytes Absolute: 0.8 10*3/uL (ref 0.1–1.0)
Monocytes Relative: 11 % (ref 3–12)
Monocytes Relative: 15 % — ABNORMAL HIGH (ref 3–12)
Neutro Abs: 5.1 10*3/uL (ref 1.7–7.7)
Neutro Abs: 6 10*3/uL (ref 1.7–7.7)
Neutrophils Relative %: 65 % (ref 43–77)
Neutrophils Relative %: 58 % (ref 43–77)
Neutrophils Relative %: 68 % (ref 43–77)

## 2011-03-13 LAB — BASIC METABOLIC PANEL
CO2: 30 mEq/L (ref 19–32)
Calcium: 8.6 mg/dL (ref 8.4–10.5)
Calcium: 8.7 mg/dL (ref 8.4–10.5)
Creatinine, Ser: 0.44 mg/dL (ref 0.4–1.5)
Creatinine, Ser: 0.44 mg/dL (ref 0.4–1.5)
GFR calc Af Amer: >60 mL/min (ref >60)
GFR calc Af Amer: >60 mL/min (ref >60)
GFR calc non Af Amer: >60 mL/min (ref >60)

## 2011-03-13 LAB — URINALYSIS, ROUTINE W REFLEX MICROSCOPIC
Bilirubin Urine: NEGATIVE
Ketones, ur: NEGATIVE mg/dL
Nitrite: NEGATIVE
Urobilinogen, UA: 0.2 mg/dL (ref 0.0–1.0)
pH: 8 (ref 5.0–8.0)

## 2011-03-13 LAB — PREALBUMIN
Prealbumin: 17.3 mg/dL — ABNORMAL LOW (ref 18.0–45.0)
Prealbumin: 17.7 mg/dL — ABNORMAL LOW (ref 18.0–45.0)
Prealbumin: 10.6 mg/dL — ABNORMAL LOW (ref 18.0–45.0)
Prealbumin: 18.6 mg/dL (ref 18.0–45.0)

## 2011-03-13 LAB — CHOLESTEROL, TOTAL
Cholesterol: 102 mg/dL (ref 0–200)
Cholesterol: 90 mg/dL (ref 0–200)
Cholesterol: 104 mg/dL (ref 0–200)
Cholesterol: 110 mg/dL (ref 0–200)

## 2011-03-13 LAB — URINE CULTURE
Colony Count: >=100000
Culture: NO GROWTH

## 2011-03-13 LAB — TRIGLYCERIDES
Triglycerides: 30 mg/dL (ref <150)
Triglycerides: 31 mg/dL (ref <150)
Triglycerides: 35 mg/dL (ref <150)
Triglycerides: 37 mg/dL (ref <150)

## 2011-03-14 LAB — COMPREHENSIVE METABOLIC PANEL
ALT: 25 U/L (ref 0–53)
ALT: 29 U/L (ref 0–53)
ALT: 33 U/L (ref 0–53)
ALT: 36 U/L (ref 0–53)
ALT: 40 U/L (ref 0–53)
ALT: 41 U/L (ref 0–53)
AST: 17 U/L (ref 0–37)
AST: 19 U/L (ref 0–37)
AST: 24 U/L (ref 0–37)
AST: 26 U/L (ref 0–37)
AST: 26 U/L (ref 0–37)
AST: 26 U/L (ref 0–37)
AST: 28 U/L (ref 0–37)
Albumin: 1.7 g/dL — ABNORMAL LOW (ref 3.5–5.2)
Albumin: 2 g/dL — ABNORMAL LOW (ref 3.5–5.2)
Albumin: 2.1 g/dL — ABNORMAL LOW (ref 3.5–5.2)
Albumin: 2.1 g/dL — ABNORMAL LOW (ref 3.5–5.2)
Albumin: 2.2 g/dL — ABNORMAL LOW (ref 3.5–5.2)
Albumin: 2.8 g/dL — ABNORMAL LOW (ref 3.5–5.2)
Alkaline Phosphatase: 254 U/L — ABNORMAL HIGH (ref 39–117)
Alkaline Phosphatase: 362 U/L — ABNORMAL HIGH (ref 39–117)
Alkaline Phosphatase: 384 U/L — ABNORMAL HIGH (ref 39–117)
Alkaline Phosphatase: 408 U/L — ABNORMAL HIGH (ref 39–117)
Alkaline Phosphatase: 412 U/L — ABNORMAL HIGH (ref 39–117)
Alkaline Phosphatase: 513 U/L — ABNORMAL HIGH (ref 39–117)
BUN: 12 mg/dL (ref 6–23)
BUN: 13 mg/dL (ref 6–23)
BUN: 15 mg/dL (ref 6–23)
BUN: 24 mg/dL — ABNORMAL HIGH (ref 6–23)
CO2: 29 mEq/L (ref 19–32)
CO2: 29 mEq/L (ref 19–32)
CO2: 30 mEq/L (ref 19–32)
CO2: 30 mEq/L (ref 19–32)
CO2: 31 mEq/L (ref 19–32)
Calcium: 7.9 mg/dL — ABNORMAL LOW (ref 8.4–10.5)
Calcium: 8.2 mg/dL — ABNORMAL LOW (ref 8.4–10.5)
Calcium: 8.3 mg/dL — ABNORMAL LOW (ref 8.4–10.5)
Calcium: 8.5 mg/dL (ref 8.4–10.5)
Calcium: 8.6 mg/dL (ref 8.4–10.5)
Calcium: 8.6 mg/dL (ref 8.4–10.5)
Calcium: 9 mg/dL (ref 8.4–10.5)
Chloride: 101 mEq/L (ref 96–112)
Chloride: 101 mEq/L (ref 96–112)
Chloride: 99 mEq/L (ref 96–112)
Creatinine, Ser: 0.44 mg/dL (ref 0.4–1.5)
Creatinine, Ser: 0.46 mg/dL (ref 0.4–1.5)
Creatinine, Ser: 0.47 mg/dL (ref 0.4–1.5)
Creatinine, Ser: 0.51 mg/dL (ref 0.4–1.5)
GFR calc Af Amer: >60 mL/min (ref >60)
GFR calc Af Amer: >60 mL/min (ref >60)
GFR calc Af Amer: >60 mL/min (ref >60)
GFR calc Af Amer: >60 mL/min (ref >60)
GFR calc Af Amer: >60 mL/min (ref >60)
GFR calc Af Amer: >60 mL/min (ref >60)
GFR calc non Af Amer: >60 mL/min (ref >60)
GFR calc non Af Amer: >60 mL/min (ref >60)
GFR calc non Af Amer: >60 mL/min (ref >60)
Glucose, Bld: 110 mg/dL — ABNORMAL HIGH (ref 70–99)
Glucose, Bld: 122 mg/dL — ABNORMAL HIGH (ref 70–99)
Glucose, Bld: 126 mg/dL — ABNORMAL HIGH (ref 70–99)
Glucose, Bld: 126 mg/dL — ABNORMAL HIGH (ref 70–99)
Glucose, Bld: 97 mg/dL (ref 70–99)
Potassium: 4.1 mEq/L (ref 3.5–5.1)
Potassium: 4.2 mEq/L (ref 3.5–5.1)
Potassium: 4.2 mEq/L (ref 3.5–5.1)
Potassium: 4.3 mEq/L (ref 3.5–5.1)
Potassium: 4.4 mEq/L (ref 3.5–5.1)
Sodium: 134 mEq/L — ABNORMAL LOW (ref 135–145)
Sodium: 135 mEq/L (ref 135–145)
Sodium: 135 mEq/L (ref 135–145)
Sodium: 136 mEq/L (ref 135–145)
Sodium: 136 mEq/L (ref 135–145)
Sodium: 137 mEq/L (ref 135–145)
Total Bilirubin: 0.9 mg/dL (ref 0.3–1.2)
Total Bilirubin: 1 mg/dL (ref 0.3–1.2)
Total Protein: 5 g/dL — ABNORMAL LOW (ref 6.0–8.3)
Total Protein: 5.2 g/dL — ABNORMAL LOW (ref 6.0–8.3)
Total Protein: 5.4 g/dL — ABNORMAL LOW (ref 6.0–8.3)
Total Protein: 5.5 g/dL — ABNORMAL LOW (ref 6.0–8.3)
Total Protein: 5.6 g/dL — ABNORMAL LOW (ref 6.0–8.3)
Total Protein: 5.8 g/dL — ABNORMAL LOW (ref 6.0–8.3)
Total Protein: 5.8 g/dL — ABNORMAL LOW (ref 6.0–8.3)
Total Protein: 6.3 g/dL (ref 6.0–8.3)

## 2011-03-14 LAB — GLUCOSE, CAPILLARY
Glucose-Capillary: 106 mg/dL — ABNORMAL HIGH (ref 70–99)
Glucose-Capillary: 108 mg/dL — ABNORMAL HIGH (ref 70–99)
Glucose-Capillary: 109 mg/dL — ABNORMAL HIGH (ref 70–99)
Glucose-Capillary: 112 mg/dL — ABNORMAL HIGH (ref 70–99)
Glucose-Capillary: 113 mg/dL — ABNORMAL HIGH (ref 70–99)
Glucose-Capillary: 113 mg/dL — ABNORMAL HIGH (ref 70–99)
Glucose-Capillary: 113 mg/dL — ABNORMAL HIGH (ref 70–99)
Glucose-Capillary: 116 mg/dL — ABNORMAL HIGH (ref 70–99)
Glucose-Capillary: 117 mg/dL — ABNORMAL HIGH (ref 70–99)
Glucose-Capillary: 119 mg/dL — ABNORMAL HIGH (ref 70–99)
Glucose-Capillary: 123 mg/dL — ABNORMAL HIGH (ref 70–99)
Glucose-Capillary: 125 mg/dL — ABNORMAL HIGH (ref 70–99)
Glucose-Capillary: 126 mg/dL — ABNORMAL HIGH (ref 70–99)
Glucose-Capillary: 128 mg/dL — ABNORMAL HIGH (ref 70–99)
Glucose-Capillary: 131 mg/dL — ABNORMAL HIGH (ref 70–99)
Glucose-Capillary: 134 mg/dL — ABNORMAL HIGH (ref 70–99)
Glucose-Capillary: 135 mg/dL — ABNORMAL HIGH (ref 70–99)
Glucose-Capillary: 135 mg/dL — ABNORMAL HIGH (ref 70–99)
Glucose-Capillary: 136 mg/dL — ABNORMAL HIGH (ref 70–99)
Glucose-Capillary: 136 mg/dL — ABNORMAL HIGH (ref 70–99)
Glucose-Capillary: 137 mg/dL — ABNORMAL HIGH (ref 70–99)
Glucose-Capillary: 137 mg/dL — ABNORMAL HIGH (ref 70–99)
Glucose-Capillary: 138 mg/dL — ABNORMAL HIGH (ref 70–99)
Glucose-Capillary: 139 mg/dL — ABNORMAL HIGH (ref 70–99)
Glucose-Capillary: 140 mg/dL — ABNORMAL HIGH (ref 70–99)
Glucose-Capillary: 140 mg/dL — ABNORMAL HIGH (ref 70–99)
Glucose-Capillary: 141 mg/dL — ABNORMAL HIGH (ref 70–99)
Glucose-Capillary: 141 mg/dL — ABNORMAL HIGH (ref 70–99)
Glucose-Capillary: 141 mg/dL — ABNORMAL HIGH (ref 70–99)
Glucose-Capillary: 142 mg/dL — ABNORMAL HIGH (ref 70–99)
Glucose-Capillary: 142 mg/dL — ABNORMAL HIGH (ref 70–99)
Glucose-Capillary: 143 mg/dL — ABNORMAL HIGH (ref 70–99)
Glucose-Capillary: 143 mg/dL — ABNORMAL HIGH (ref 70–99)
Glucose-Capillary: 144 mg/dL — ABNORMAL HIGH (ref 70–99)
Glucose-Capillary: 145 mg/dL — ABNORMAL HIGH (ref 70–99)
Glucose-Capillary: 146 mg/dL — ABNORMAL HIGH (ref 70–99)
Glucose-Capillary: 146 mg/dL — ABNORMAL HIGH (ref 70–99)
Glucose-Capillary: 146 mg/dL — ABNORMAL HIGH (ref 70–99)
Glucose-Capillary: 147 mg/dL — ABNORMAL HIGH (ref 70–99)
Glucose-Capillary: 149 mg/dL — ABNORMAL HIGH (ref 70–99)
Glucose-Capillary: 149 mg/dL — ABNORMAL HIGH (ref 70–99)
Glucose-Capillary: 151 mg/dL — ABNORMAL HIGH (ref 70–99)
Glucose-Capillary: 155 mg/dL — ABNORMAL HIGH (ref 70–99)
Glucose-Capillary: 155 mg/dL — ABNORMAL HIGH (ref 70–99)
Glucose-Capillary: 156 mg/dL — ABNORMAL HIGH (ref 70–99)
Glucose-Capillary: 156 mg/dL — ABNORMAL HIGH (ref 70–99)
Glucose-Capillary: 157 mg/dL — ABNORMAL HIGH (ref 70–99)
Glucose-Capillary: 157 mg/dL — ABNORMAL HIGH (ref 70–99)
Glucose-Capillary: 162 mg/dL — ABNORMAL HIGH (ref 70–99)
Glucose-Capillary: 163 mg/dL — ABNORMAL HIGH (ref 70–99)
Glucose-Capillary: 164 mg/dL — ABNORMAL HIGH (ref 70–99)
Glucose-Capillary: 164 mg/dL — ABNORMAL HIGH (ref 70–99)
Glucose-Capillary: 165 mg/dL — ABNORMAL HIGH (ref 70–99)
Glucose-Capillary: 166 mg/dL — ABNORMAL HIGH (ref 70–99)
Glucose-Capillary: 166 mg/dL — ABNORMAL HIGH (ref 70–99)
Glucose-Capillary: 169 mg/dL — ABNORMAL HIGH (ref 70–99)
Glucose-Capillary: 170 mg/dL — ABNORMAL HIGH (ref 70–99)
Glucose-Capillary: 170 mg/dL — ABNORMAL HIGH (ref 70–99)
Glucose-Capillary: 171 mg/dL — ABNORMAL HIGH (ref 70–99)
Glucose-Capillary: 172 mg/dL — ABNORMAL HIGH (ref 70–99)
Glucose-Capillary: 173 mg/dL — ABNORMAL HIGH (ref 70–99)
Glucose-Capillary: 181 mg/dL — ABNORMAL HIGH (ref 70–99)
Glucose-Capillary: 186 mg/dL — ABNORMAL HIGH (ref 70–99)
Glucose-Capillary: 86 mg/dL (ref 70–99)
Glucose-Capillary: 92 mg/dL (ref 70–99)
Glucose-Capillary: 97 mg/dL (ref 70–99)
Glucose-Capillary: 99 mg/dL (ref 70–99)

## 2011-03-14 LAB — CBC
HCT: 24.9 % — ABNORMAL LOW (ref 39.0–52.0)
HCT: 27.3 % — ABNORMAL LOW (ref 39.0–52.0)
HCT: 28.5 % — ABNORMAL LOW (ref 39.0–52.0)
HCT: 29 % — ABNORMAL LOW (ref 39.0–52.0)
HCT: 31 % — ABNORMAL LOW (ref 39.0–52.0)
Hemoglobin: 10 g/dL — ABNORMAL LOW (ref 13.0–17.0)
Hemoglobin: 10.5 g/dL — ABNORMAL LOW (ref 13.0–17.0)
Hemoglobin: 10.9 g/dL — ABNORMAL LOW (ref 13.0–17.0)
Hemoglobin: 11.1 g/dL — ABNORMAL LOW (ref 13.0–17.0)
Hemoglobin: 8.2 g/dL — ABNORMAL LOW (ref 13.0–17.0)
Hemoglobin: 9.6 g/dL — ABNORMAL LOW (ref 13.0–17.0)
Hemoglobin: 9.6 g/dL — ABNORMAL LOW (ref 13.0–17.0)
MCHC: 32.8 g/dL (ref 30.0–36.0)
MCHC: 32.9 g/dL (ref 30.0–36.0)
MCHC: 33 g/dL (ref 30.0–36.0)
MCHC: 33 g/dL (ref 30.0–36.0)
MCHC: 33.1 g/dL (ref 30.0–36.0)
MCHC: 33.1 g/dL (ref 30.0–36.0)
MCHC: 33.2 g/dL (ref 30.0–36.0)
MCHC: 33.2 g/dL (ref 30.0–36.0)
MCHC: 33.3 g/dL (ref 30.0–36.0)
MCV: 89.3 fL (ref 78.0–100.0)
MCV: 89.5 fL (ref 78.0–100.0)
MCV: 89.6 fL (ref 78.0–100.0)
MCV: 90.1 fL (ref 78.0–100.0)
MCV: 90.4 fL (ref 78.0–100.0)
MCV: 90.5 fL (ref 78.0–100.0)
MCV: 91.7 fL (ref 78.0–100.0)
Platelets: 239 10*3/uL (ref 150–400)
Platelets: 274 10*3/uL (ref 150–400)
Platelets: 283 10*3/uL (ref 150–400)
Platelets: 293 10*3/uL (ref 150–400)
Platelets: 294 10*3/uL (ref 150–400)
Platelets: 352 10*3/uL (ref 150–400)
Platelets: 364 10*3/uL (ref 150–400)
Platelets: 372 10*3/uL (ref 150–400)
RBC: 2.76 MIL/uL — ABNORMAL LOW (ref 4.22–5.81)
RBC: 3.05 MIL/uL — ABNORMAL LOW (ref 4.22–5.81)
RBC: 3.21 MIL/uL — ABNORMAL LOW (ref 4.22–5.81)
RBC: 3.27 MIL/uL — ABNORMAL LOW (ref 4.22–5.81)
RBC: 3.34 MIL/uL — ABNORMAL LOW (ref 4.22–5.81)
RBC: 3.48 MIL/uL — ABNORMAL LOW (ref 4.22–5.81)
RBC: 3.54 MIL/uL — ABNORMAL LOW (ref 4.22–5.81)
RBC: 3.59 MIL/uL — ABNORMAL LOW (ref 4.22–5.81)
RBC: 3.68 MIL/uL — ABNORMAL LOW (ref 4.22–5.81)
RDW: 15.7 % — ABNORMAL HIGH (ref 11.5–15.5)
RDW: 15.9 % — ABNORMAL HIGH (ref 11.5–15.5)
RDW: 15.9 % — ABNORMAL HIGH (ref 11.5–15.5)
RDW: 15.9 % — ABNORMAL HIGH (ref 11.5–15.5)
RDW: 16.3 % — ABNORMAL HIGH (ref 11.5–15.5)
RDW: 16.3 % — ABNORMAL HIGH (ref 11.5–15.5)
RDW: 16.3 % — ABNORMAL HIGH (ref 11.5–15.5)
RDW: 16.9 % — ABNORMAL HIGH (ref 11.5–15.5)
WBC: 10.3 10*3/uL (ref 4.0–10.5)
WBC: 14.3 10*3/uL — ABNORMAL HIGH (ref 4.0–10.5)
WBC: 17.4 10*3/uL — ABNORMAL HIGH (ref 4.0–10.5)
WBC: 17.9 10*3/uL — ABNORMAL HIGH (ref 4.0–10.5)
WBC: 19.1 10*3/uL — ABNORMAL HIGH (ref 4.0–10.5)
WBC: 20.7 10*3/uL — ABNORMAL HIGH (ref 4.0–10.5)
WBC: 32.8 10*3/uL — ABNORMAL HIGH (ref 4.0–10.5)
WBC: 8.3 10*3/uL (ref 4.0–10.5)

## 2011-03-14 LAB — URINE MICROSCOPIC-ADD ON

## 2011-03-14 LAB — BASIC METABOLIC PANEL
BUN: 12 mg/dL (ref 6–23)
BUN: 14 mg/dL (ref 6–23)
CO2: 28 mEq/L (ref 19–32)
CO2: 28 mEq/L (ref 19–32)
CO2: 29 mEq/L (ref 19–32)
CO2: 30 mEq/L (ref 19–32)
CO2: 31 mEq/L (ref 19–32)
CO2: 32 mEq/L (ref 19–32)
Calcium: 7.7 mg/dL — ABNORMAL LOW (ref 8.4–10.5)
Calcium: 8.3 mg/dL — ABNORMAL LOW (ref 8.4–10.5)
Calcium: 8.3 mg/dL — ABNORMAL LOW (ref 8.4–10.5)
Calcium: 8.6 mg/dL (ref 8.4–10.5)
Calcium: 8.7 mg/dL (ref 8.4–10.5)
Chloride: 102 mEq/L (ref 96–112)
Chloride: 97 mEq/L (ref 96–112)
Chloride: 99 mEq/L (ref 96–112)
Chloride: 99 mEq/L (ref 96–112)
Creatinine, Ser: 0.38 mg/dL — ABNORMAL LOW (ref 0.4–1.5)
Creatinine, Ser: 0.4 mg/dL (ref 0.4–1.5)
Creatinine, Ser: 0.43 mg/dL (ref 0.4–1.5)
Creatinine, Ser: 0.51 mg/dL (ref 0.4–1.5)
GFR calc Af Amer: >60 mL/min (ref >60)
GFR calc Af Amer: >60 mL/min (ref >60)
GFR calc Af Amer: >60 mL/min (ref >60)
GFR calc Af Amer: >60 mL/min (ref >60)
GFR calc Af Amer: >60 mL/min (ref >60)
GFR calc non Af Amer: >60 mL/min (ref >60)
GFR calc non Af Amer: >60 mL/min (ref >60)
GFR calc non Af Amer: >60 mL/min (ref >60)
Glucose, Bld: 110 mg/dL — ABNORMAL HIGH (ref 70–99)
Glucose, Bld: 128 mg/dL — ABNORMAL HIGH (ref 70–99)
Glucose, Bld: 130 mg/dL — ABNORMAL HIGH (ref 70–99)
Glucose, Bld: 148 mg/dL — ABNORMAL HIGH (ref 70–99)
Glucose, Bld: 89 mg/dL (ref 70–99)
Potassium: 4 mEq/L (ref 3.5–5.1)
Potassium: 4.1 mEq/L (ref 3.5–5.1)
Potassium: 4.3 mEq/L (ref 3.5–5.1)
Sodium: 132 mEq/L — ABNORMAL LOW (ref 135–145)
Sodium: 133 mEq/L — ABNORMAL LOW (ref 135–145)
Sodium: 133 mEq/L — ABNORMAL LOW (ref 135–145)
Sodium: 137 mEq/L (ref 135–145)
Sodium: 138 mEq/L (ref 135–145)

## 2011-03-14 LAB — MAGNESIUM
Magnesium: 1.9 mg/dL (ref 1.5–2.5)
Magnesium: 2 mg/dL (ref 1.5–2.5)
Magnesium: 2.1 mg/dL (ref 1.5–2.5)
Magnesium: 2.1 mg/dL (ref 1.5–2.5)
Magnesium: 2.2 mg/dL (ref 1.5–2.5)

## 2011-03-14 LAB — DIFFERENTIAL
Basophils Absolute: 0.1 10*3/uL (ref 0.0–0.1)
Basophils Relative: 0 % (ref 0–1)
Basophils Relative: 1 % (ref 0–1)
Basophils Relative: 1 % (ref 0–1)
Eosinophils Absolute: 0.2 10*3/uL (ref 0.0–0.7)
Eosinophils Absolute: 0.2 10*3/uL (ref 0.0–0.7)
Eosinophils Absolute: 0.3 10*3/uL (ref 0.0–0.7)
Eosinophils Absolute: 0.5 10*3/uL (ref 0.0–0.7)
Eosinophils Absolute: 0.6 10*3/uL (ref 0.0–0.7)
Eosinophils Relative: 2 % (ref 0–5)
Eosinophils Relative: 2 % (ref 0–5)
Eosinophils Relative: 3 % (ref 0–5)
Eosinophils Relative: 3 % (ref 0–5)
Lymphocytes Relative: 21 % (ref 12–46)
Lymphocytes Relative: 5 % — ABNORMAL LOW (ref 12–46)
Lymphocytes Relative: 7 % — ABNORMAL LOW (ref 12–46)
Lymphocytes Relative: 8 % — ABNORMAL LOW (ref 12–46)
Lymphs Abs: 1.2 10*3/uL (ref 0.7–4.0)
Lymphs Abs: 1.2 10*3/uL (ref 0.7–4.0)
Lymphs Abs: 1.5 10*3/uL (ref 0.7–4.0)
Lymphs Abs: 1.7 10*3/uL (ref 0.7–4.0)
Monocytes Absolute: 1.5 10*3/uL — ABNORMAL HIGH (ref 0.1–1.0)
Monocytes Absolute: 1.7 10*3/uL — ABNORMAL HIGH (ref 0.1–1.0)
Monocytes Relative: 8 % (ref 3–12)
Monocytes Relative: 8 % (ref 3–12)
Monocytes Relative: 8 % (ref 3–12)
Monocytes Relative: 9 % (ref 3–12)
Monocytes Relative: 9 % (ref 3–12)
Neutro Abs: 15 10*3/uL — ABNORMAL HIGH (ref 1.7–7.7)
Neutro Abs: 28.4 10*3/uL — ABNORMAL HIGH (ref 1.7–7.7)
Neutrophils Relative %: 68 % (ref 43–77)
Neutrophils Relative %: 70 % (ref 43–77)
Neutrophils Relative %: 87 % — ABNORMAL HIGH (ref 43–77)

## 2011-03-14 LAB — URINALYSIS, ROUTINE W REFLEX MICROSCOPIC
Bilirubin Urine: NEGATIVE
Protein, ur: 30 mg/dL — AB
Urobilinogen, UA: 0.2 mg/dL (ref 0.0–1.0)

## 2011-03-14 LAB — POCT I-STAT 3, ART BLOOD GAS (G3+)
Acid-Base Excess: 6 mmol/L — ABNORMAL HIGH (ref 0.0–2.0)
Bicarbonate: 29.1 meq/L — ABNORMAL HIGH (ref 20.0–24.0)
Patient temperature: 98
TCO2: 30 mmol/L (ref 0–100)
pH, Arterial: 7.515 — ABNORMAL HIGH (ref 7.350–7.450)
pO2, Arterial: 35 mmHg — CL (ref 80.0–100.0)

## 2011-03-14 LAB — URINE CULTURE
Colony Count: NO GROWTH
Colony Count: NO GROWTH
Culture: NO GROWTH
Culture: NO GROWTH
Special Requests: NEGATIVE

## 2011-03-14 LAB — PHOSPHORUS
Phosphorus: 3.6 mg/dL (ref 2.3–4.6)
Phosphorus: 3.9 mg/dL (ref 2.3–4.6)
Phosphorus: 4.2 mg/dL (ref 2.3–4.6)
Phosphorus: 4.5 mg/dL (ref 2.3–4.6)

## 2011-03-14 LAB — CHOLESTEROL, TOTAL
Cholesterol: 113 mg/dL (ref 0–200)
Cholesterol: 76 mg/dL (ref 0–200)

## 2011-03-14 LAB — TRIGLYCERIDES
Triglycerides: 53 mg/dL (ref <150)
Triglycerides: 54 mg/dL (ref <150)

## 2011-03-14 LAB — CULTURE, RESPIRATORY W GRAM STAIN

## 2011-03-14 LAB — CULTURE, BLOOD (ROUTINE X 2)

## 2011-03-14 LAB — PREALBUMIN
Prealbumin: 11.7 mg/dL — ABNORMAL LOW (ref 18.0–45.0)
Prealbumin: 15.5 mg/dL — ABNORMAL LOW (ref 18.0–45.0)

## 2011-03-15 LAB — GLUCOSE, CAPILLARY
Glucose-Capillary: 109 mg/dL — ABNORMAL HIGH (ref 70–99)
Glucose-Capillary: 113 mg/dL — ABNORMAL HIGH (ref 70–99)
Glucose-Capillary: 115 mg/dL — ABNORMAL HIGH (ref 70–99)
Glucose-Capillary: 115 mg/dL — ABNORMAL HIGH (ref 70–99)
Glucose-Capillary: 128 mg/dL — ABNORMAL HIGH (ref 70–99)
Glucose-Capillary: 129 mg/dL — ABNORMAL HIGH (ref 70–99)
Glucose-Capillary: 145 mg/dL — ABNORMAL HIGH (ref 70–99)
Glucose-Capillary: 145 mg/dL — ABNORMAL HIGH (ref 70–99)
Glucose-Capillary: 146 mg/dL — ABNORMAL HIGH (ref 70–99)
Glucose-Capillary: 151 mg/dL — ABNORMAL HIGH (ref 70–99)
Glucose-Capillary: 152 mg/dL — ABNORMAL HIGH (ref 70–99)
Glucose-Capillary: 172 mg/dL — ABNORMAL HIGH (ref 70–99)
Glucose-Capillary: 71 mg/dL (ref 70–99)
Glucose-Capillary: 80 mg/dL (ref 70–99)
Glucose-Capillary: 84 mg/dL (ref 70–99)

## 2011-03-15 LAB — PREALBUMIN
Prealbumin: 4.7 mg/dL — ABNORMAL LOW (ref 18.0–45.0)
Prealbumin: 9.4 mg/dL — ABNORMAL LOW (ref 18.0–45.0)

## 2011-03-15 LAB — BLOOD GAS, ARTERIAL
Acid-Base Excess: 6 mmol/L — ABNORMAL HIGH (ref 0.0–2.0)
Bicarbonate: 26.7 mEq/L — ABNORMAL HIGH (ref 20.0–24.0)
Bicarbonate: 30.3 mEq/L — ABNORMAL HIGH (ref 20.0–24.0)
Bicarbonate: 30.7 mEq/L — ABNORMAL HIGH (ref 20.0–24.0)
Drawn by: 31297
FIO2: 0.4 %
MECHVT: 550 mL
MECHVT: 550 mL
MECHVT: 550 mL
O2 Saturation: 100 %
O2 Saturation: 98.8 %
O2 Saturation: 99.2 %
PEEP: 5 cmH2O
PEEP: 5 cmH2O
PEEP: 5 cmH2O
PEEP: 5 cmH2O
Patient temperature: 101
Patient temperature: 103.2
Patient temperature: 98.6
RATE: 14 resp/min
TCO2: 27.9 mmol/L (ref 0–100)
TCO2: 31.7 mmol/L (ref 0–100)
TCO2: 32 mmol/L (ref 0–100)
pCO2 arterial: 45.9 mmHg — ABNORMAL HIGH (ref 35.0–45.0)
pCO2 arterial: 52.7 mmHg — ABNORMAL HIGH (ref 35.0–45.0)
pH, Arterial: 7.392 (ref 7.350–7.450)
pH, Arterial: 7.426 (ref 7.350–7.450)
pH, Arterial: 7.429 (ref 7.350–7.450)
pH, Arterial: 7.45 (ref 7.350–7.450)
pO2, Arterial: 130 mmHg — ABNORMAL HIGH (ref 80.0–100.0)
pO2, Arterial: 87.2 mmHg (ref 80.0–100.0)

## 2011-03-15 LAB — COMPREHENSIVE METABOLIC PANEL
ALT: 13 U/L (ref 0–53)
ALT: 34 U/L (ref 0–53)
ALT: 46 U/L (ref 0–53)
AST: 139 U/L — ABNORMAL HIGH (ref 0–37)
AST: 43 U/L — ABNORMAL HIGH (ref 0–37)
Albumin: 1.3 g/dL — ABNORMAL LOW (ref 3.5–5.2)
Albumin: 1.4 g/dL — ABNORMAL LOW (ref 3.5–5.2)
Albumin: 1.4 g/dL — ABNORMAL LOW (ref 3.5–5.2)
Albumin: 1.5 g/dL — ABNORMAL LOW (ref 3.5–5.2)
Albumin: 1.9 g/dL — ABNORMAL LOW (ref 3.5–5.2)
Alkaline Phosphatase: 186 U/L — ABNORMAL HIGH (ref 39–117)
Alkaline Phosphatase: 210 U/L — ABNORMAL HIGH (ref 39–117)
Alkaline Phosphatase: 48 U/L (ref 39–117)
Alkaline Phosphatase: 50 U/L (ref 39–117)
Alkaline Phosphatase: 86 U/L (ref 39–117)
BUN: 12 mg/dL (ref 6–23)
BUN: 12 mg/dL (ref 6–23)
BUN: 14 mg/dL (ref 6–23)
BUN: 15 mg/dL (ref 6–23)
BUN: 15 mg/dL (ref 6–23)
BUN: 16 mg/dL (ref 6–23)
BUN: 9 mg/dL (ref 6–23)
CO2: 28 mEq/L (ref 19–32)
CO2: 29 mEq/L (ref 19–32)
Calcium: 7.1 mg/dL — ABNORMAL LOW (ref 8.4–10.5)
Calcium: 7.3 mg/dL — ABNORMAL LOW (ref 8.4–10.5)
Calcium: 7.5 mg/dL — ABNORMAL LOW (ref 8.4–10.5)
Calcium: 7.8 mg/dL — ABNORMAL LOW (ref 8.4–10.5)
Chloride: 103 mEq/L (ref 96–112)
Chloride: 105 mEq/L (ref 96–112)
Chloride: 108 mEq/L (ref 96–112)
Chloride: 113 mEq/L — ABNORMAL HIGH (ref 96–112)
Creatinine, Ser: 0.58 mg/dL (ref 0.4–1.5)
Creatinine, Ser: 0.61 mg/dL (ref 0.4–1.5)
Creatinine, Ser: 0.74 mg/dL (ref 0.4–1.5)
Creatinine, Ser: 0.88 mg/dL (ref 0.4–1.5)
GFR calc Af Amer: >60 mL/min (ref >60)
GFR calc Af Amer: >60 mL/min (ref >60)
GFR calc non Af Amer: >60 mL/min (ref >60)
GFR calc non Af Amer: >60 mL/min (ref >60)
Glucose, Bld: 107 mg/dL — ABNORMAL HIGH (ref 70–99)
Glucose, Bld: 130 mg/dL — ABNORMAL HIGH (ref 70–99)
Glucose, Bld: 131 mg/dL — ABNORMAL HIGH (ref 70–99)
Glucose, Bld: 141 mg/dL — ABNORMAL HIGH (ref 70–99)
Glucose, Bld: 155 mg/dL — ABNORMAL HIGH (ref 70–99)
Glucose, Bld: 157 mg/dL — ABNORMAL HIGH (ref 70–99)
Potassium: 3.7 mEq/L (ref 3.5–5.1)
Potassium: 4.2 mEq/L (ref 3.5–5.1)
Potassium: 4.2 mEq/L (ref 3.5–5.1)
Potassium: 4.4 mEq/L (ref 3.5–5.1)
Potassium: 4.6 mEq/L (ref 3.5–5.1)
Sodium: 139 mEq/L (ref 135–145)
Sodium: 142 mEq/L (ref 135–145)
Sodium: 143 mEq/L (ref 135–145)
Total Bilirubin: 0.9 mg/dL (ref 0.3–1.2)
Total Bilirubin: 1.5 mg/dL — ABNORMAL HIGH (ref 0.3–1.2)
Total Bilirubin: 1.5 mg/dL — ABNORMAL HIGH (ref 0.3–1.2)
Total Bilirubin: 1.8 mg/dL — ABNORMAL HIGH (ref 0.3–1.2)
Total Bilirubin: 1.9 mg/dL — ABNORMAL HIGH (ref 0.3–1.2)
Total Protein: 3.8 g/dL — ABNORMAL LOW (ref 6.0–8.3)
Total Protein: 4.1 g/dL — ABNORMAL LOW (ref 6.0–8.3)
Total Protein: 4.2 g/dL — ABNORMAL LOW (ref 6.0–8.3)
Total Protein: 4.4 g/dL — ABNORMAL LOW (ref 6.0–8.3)
Total Protein: 4.4 g/dL — ABNORMAL LOW (ref 6.0–8.3)
Total Protein: 4.6 g/dL — ABNORMAL LOW (ref 6.0–8.3)
Total Protein: 4.8 g/dL — ABNORMAL LOW (ref 6.0–8.3)

## 2011-03-15 LAB — CULTURE, BAL-QUANTITATIVE W GRAM STAIN: Colony Count: >=100000

## 2011-03-15 LAB — CBC
HCT: 24.2 % — ABNORMAL LOW (ref 39.0–52.0)
HCT: 24.4 % — ABNORMAL LOW (ref 39.0–52.0)
HCT: 24.4 % — ABNORMAL LOW (ref 39.0–52.0)
HCT: 25.7 % — ABNORMAL LOW (ref 39.0–52.0)
HCT: 26 % — ABNORMAL LOW (ref 39.0–52.0)
HCT: 27.3 % — ABNORMAL LOW (ref 39.0–52.0)
HCT: 27.5 % — ABNORMAL LOW (ref 39.0–52.0)
HCT: 27.6 % — ABNORMAL LOW (ref 39.0–52.0)
HCT: 27.9 % — ABNORMAL LOW (ref 39.0–52.0)
HCT: 27.9 % — ABNORMAL LOW (ref 39.0–52.0)
HCT: 28.2 % — ABNORMAL LOW (ref 39.0–52.0)
HCT: 28.6 % — ABNORMAL LOW (ref 39.0–52.0)
HCT: 30.4 % — ABNORMAL LOW (ref 39.0–52.0)
HCT: 30.5 % — ABNORMAL LOW (ref 39.0–52.0)
HCT: 33.2 % — ABNORMAL LOW (ref 39.0–52.0)
HCT: 39.3 % (ref 39.0–52.0)
HCT: 46.7 % (ref 39.0–52.0)
HCT: 50.2 % (ref 39.0–52.0)
Hemoglobin: 11.3 g/dL — ABNORMAL LOW (ref 13.0–17.0)
Hemoglobin: 13.2 g/dL (ref 13.0–17.0)
Hemoglobin: 15.6 g/dL (ref 13.0–17.0)
Hemoglobin: 8 g/dL — ABNORMAL LOW (ref 13.0–17.0)
Hemoglobin: 8.1 g/dL — ABNORMAL LOW (ref 13.0–17.0)
Hemoglobin: 8.7 g/dL — ABNORMAL LOW (ref 13.0–17.0)
Hemoglobin: 9.2 g/dL — ABNORMAL LOW (ref 13.0–17.0)
Hemoglobin: 9.4 g/dL — ABNORMAL LOW (ref 13.0–17.0)
Hemoglobin: 9.6 g/dL — ABNORMAL LOW (ref 13.0–17.0)
Hemoglobin: 9.6 g/dL — ABNORMAL LOW (ref 13.0–17.0)
Hemoglobin: 9.7 g/dL — ABNORMAL LOW (ref 13.0–17.0)
Hemoglobin: 9.7 g/dL — ABNORMAL LOW (ref 13.0–17.0)
Hemoglobin: 9.9 g/dL — ABNORMAL LOW (ref 13.0–17.0)
MCHC: 32.7 g/dL (ref 30.0–36.0)
MCHC: 33 g/dL (ref 30.0–36.0)
MCHC: 33.4 g/dL (ref 30.0–36.0)
MCHC: 33.4 g/dL (ref 30.0–36.0)
MCHC: 33.5 g/dL (ref 30.0–36.0)
MCHC: 33.6 g/dL (ref 30.0–36.0)
MCHC: 33.8 g/dL (ref 30.0–36.0)
MCHC: 34.1 g/dL (ref 30.0–36.0)
MCHC: 34.2 g/dL (ref 30.0–36.0)
MCHC: 34.2 g/dL (ref 30.0–36.0)
MCHC: 34.4 g/dL (ref 30.0–36.0)
MCHC: 34.6 g/dL (ref 30.0–36.0)
MCHC: 34.7 g/dL (ref 30.0–36.0)
MCHC: 35.5 g/dL (ref 30.0–36.0)
MCV: 86.9 fL (ref 78.0–100.0)
MCV: 86.9 fL (ref 78.0–100.0)
MCV: 87.7 fL (ref 78.0–100.0)
MCV: 88.5 fL (ref 78.0–100.0)
MCV: 88.6 fL (ref 78.0–100.0)
MCV: 88.9 fL (ref 78.0–100.0)
MCV: 89 fL (ref 78.0–100.0)
MCV: 89 fL (ref 78.0–100.0)
MCV: 89.4 fL (ref 78.0–100.0)
MCV: 89.5 fL (ref 78.0–100.0)
MCV: 89.9 fL (ref 78.0–100.0)
MCV: 90.1 fL (ref 78.0–100.0)
MCV: 90.3 fL (ref 78.0–100.0)
MCV: 90.7 fL (ref 78.0–100.0)
MCV: 90.8 fL (ref 78.0–100.0)
Platelets: 139 10*3/uL — ABNORMAL LOW (ref 150–400)
Platelets: 268 10*3/uL (ref 150–400)
Platelets: 416 10*3/uL — ABNORMAL HIGH (ref 150–400)
Platelets: 494 10*3/uL — ABNORMAL HIGH (ref 150–400)
Platelets: 501 10*3/uL — ABNORMAL HIGH (ref 150–400)
Platelets: 52 10*3/uL — ABNORMAL LOW (ref 150–400)
Platelets: 575 10*3/uL — ABNORMAL HIGH (ref 150–400)
Platelets: 587 10*3/uL — ABNORMAL HIGH (ref 150–400)
Platelets: 610 10*3/uL — ABNORMAL HIGH (ref 150–400)
Platelets: 718 10*3/uL — ABNORMAL HIGH (ref 150–400)
Platelets: 72 10*3/uL — ABNORMAL LOW (ref 150–400)
Platelets: 724 10*3/uL — ABNORMAL HIGH (ref 150–400)
Platelets: 758 10*3/uL — ABNORMAL HIGH (ref 150–400)
Platelets: 76 10*3/uL — ABNORMAL LOW (ref 150–400)
Platelets: 99 10*3/uL — ABNORMAL LOW (ref 150–400)
RBC: 2.74 MIL/uL — ABNORMAL LOW (ref 4.22–5.81)
RBC: 2.9 MIL/uL — ABNORMAL LOW (ref 4.22–5.81)
RBC: 3.01 MIL/uL — ABNORMAL LOW (ref 4.22–5.81)
RBC: 3.06 MIL/uL — ABNORMAL LOW (ref 4.22–5.81)
RBC: 3.1 MIL/uL — ABNORMAL LOW (ref 4.22–5.81)
RBC: 3.15 MIL/uL — ABNORMAL LOW (ref 4.22–5.81)
RBC: 3.17 MIL/uL — ABNORMAL LOW (ref 4.22–5.81)
RBC: 3.18 MIL/uL — ABNORMAL LOW (ref 4.22–5.81)
RBC: 3.28 MIL/uL — ABNORMAL LOW (ref 4.22–5.81)
RBC: 3.36 MIL/uL — ABNORMAL LOW (ref 4.22–5.81)
RBC: 3.49 MIL/uL — ABNORMAL LOW (ref 4.22–5.81)
RBC: 3.65 MIL/uL — ABNORMAL LOW (ref 4.22–5.81)
RBC: 3.72 MIL/uL — ABNORMAL LOW (ref 4.22–5.81)
RBC: 4.33 MIL/uL (ref 4.22–5.81)
RBC: 5.29 MIL/uL (ref 4.22–5.81)
RBC: 5.77 MIL/uL (ref 4.22–5.81)
RDW: 14.3 % (ref 11.5–15.5)
RDW: 14.4 % (ref 11.5–15.5)
RDW: 14.5 % (ref 11.5–15.5)
RDW: 14.5 % (ref 11.5–15.5)
RDW: 14.9 % (ref 11.5–15.5)
RDW: 15 % (ref 11.5–15.5)
RDW: 15.1 % (ref 11.5–15.5)
RDW: 15.1 % (ref 11.5–15.5)
RDW: 15.1 % (ref 11.5–15.5)
RDW: 15.1 % (ref 11.5–15.5)
RDW: 15.3 % (ref 11.5–15.5)
RDW: 15.3 % (ref 11.5–15.5)
RDW: 15.3 % (ref 11.5–15.5)
RDW: 15.3 % (ref 11.5–15.5)
RDW: 15.4 % (ref 11.5–15.5)
RDW: 15.4 % (ref 11.5–15.5)
RDW: 15.4 % (ref 11.5–15.5)
RDW: 15.6 % — ABNORMAL HIGH (ref 11.5–15.5)
RDW: 15.8 % — ABNORMAL HIGH (ref 11.5–15.5)
WBC: 10.1 10*3/uL (ref 4.0–10.5)
WBC: 10.1 10*3/uL (ref 4.0–10.5)
WBC: 12 10*3/uL — ABNORMAL HIGH (ref 4.0–10.5)
WBC: 12.2 10*3/uL — ABNORMAL HIGH (ref 4.0–10.5)
WBC: 15.9 10*3/uL — ABNORMAL HIGH (ref 4.0–10.5)
WBC: 17.3 10*3/uL — ABNORMAL HIGH (ref 4.0–10.5)
WBC: 20.4 10*3/uL — ABNORMAL HIGH (ref 4.0–10.5)
WBC: 22.8 10*3/uL — ABNORMAL HIGH (ref 4.0–10.5)
WBC: 23.2 10*3/uL — ABNORMAL HIGH (ref 4.0–10.5)
WBC: 23.9 10*3/uL — ABNORMAL HIGH (ref 4.0–10.5)
WBC: 26 10*3/uL — ABNORMAL HIGH (ref 4.0–10.5)
WBC: 26.5 10*3/uL — ABNORMAL HIGH (ref 4.0–10.5)
WBC: 28.4 10*3/uL — ABNORMAL HIGH (ref 4.0–10.5)
WBC: 36.6 10*3/uL — ABNORMAL HIGH (ref 4.0–10.5)
WBC: 8.3 10*3/uL (ref 4.0–10.5)
WBC: 9.3 10*3/uL (ref 4.0–10.5)

## 2011-03-15 LAB — TYPE AND SCREEN
ABO/RH(D): A NEG
Antibody Screen: NEGATIVE
Antibody Screen: NEGATIVE

## 2011-03-15 LAB — BASIC METABOLIC PANEL
BUN: 11 mg/dL (ref 6–23)
BUN: 13 mg/dL (ref 6–23)
BUN: 14 mg/dL (ref 6–23)
BUN: 14 mg/dL (ref 6–23)
BUN: 15 mg/dL (ref 6–23)
BUN: 16 mg/dL (ref 6–23)
BUN: 4 mg/dL — ABNORMAL LOW (ref 6–23)
BUN: 7 mg/dL (ref 6–23)
CO2: 16 meq/L — ABNORMAL LOW (ref 19–32)
CO2: 26 mEq/L (ref 19–32)
CO2: 27 mEq/L (ref 19–32)
CO2: 28 mEq/L (ref 19–32)
CO2: 29 mEq/L (ref 19–32)
CO2: 29 mEq/L (ref 19–32)
Calcium: 7.2 mg/dL — ABNORMAL LOW (ref 8.4–10.5)
Calcium: 7.3 mg/dL — ABNORMAL LOW (ref 8.4–10.5)
Calcium: 7.3 mg/dL — ABNORMAL LOW (ref 8.4–10.5)
Calcium: 7.4 mg/dL — ABNORMAL LOW (ref 8.4–10.5)
Calcium: 7.5 mg/dL — ABNORMAL LOW (ref 8.4–10.5)
Calcium: 7.6 mg/dL — ABNORMAL LOW (ref 8.4–10.5)
Calcium: 7.7 mg/dL — ABNORMAL LOW (ref 8.4–10.5)
Chloride: 101 mEq/L (ref 96–112)
Chloride: 103 mEq/L (ref 96–112)
Chloride: 104 mEq/L (ref 96–112)
Chloride: 104 mEq/L (ref 96–112)
Chloride: 107 mEq/L (ref 96–112)
Chloride: 110 mEq/L (ref 96–112)
Chloride: 114 mEq/L — ABNORMAL HIGH (ref 96–112)
Chloride: 115 mEq/L — ABNORMAL HIGH (ref 96–112)
Chloride: 117 meq/L — ABNORMAL HIGH (ref 96–112)
Creatinine, Ser: 0.45 mg/dL (ref 0.4–1.5)
Creatinine, Ser: 0.52 mg/dL (ref 0.4–1.5)
Creatinine, Ser: 0.55 mg/dL (ref 0.4–1.5)
Creatinine, Ser: 0.56 mg/dL (ref 0.4–1.5)
Creatinine, Ser: 0.59 mg/dL (ref 0.4–1.5)
Creatinine, Ser: 0.68 mg/dL (ref 0.4–1.5)
Creatinine, Ser: 0.73 mg/dL (ref 0.4–1.5)
Creatinine, Ser: 0.77 mg/dL (ref 0.4–1.5)
Creatinine, Ser: 0.93 mg/dL (ref 0.4–1.5)
GFR calc Af Amer: >60 mL/min (ref >60)
GFR calc Af Amer: >60 mL/min (ref >60)
GFR calc Af Amer: >60 mL/min (ref >60)
GFR calc Af Amer: >60 mL/min (ref >60)
GFR calc Af Amer: >60 mL/min (ref >60)
GFR calc Af Amer: >60 mL/min (ref >60)
GFR calc Af Amer: >60 mL/min (ref >60)
GFR calc non Af Amer: >60 mL/min (ref >60)
GFR calc non Af Amer: >60 mL/min (ref >60)
GFR calc non Af Amer: >60 mL/min (ref >60)
GFR calc non Af Amer: >60 mL/min (ref >60)
GFR calc non Af Amer: >60 mL/min (ref >60)
GFR calc non Af Amer: >60 mL/min (ref >60)
GFR calc non Af Amer: >60 mL/min (ref >60)
Glucose, Bld: 119 mg/dL — ABNORMAL HIGH (ref 70–99)
Glucose, Bld: 125 mg/dL — ABNORMAL HIGH (ref 70–99)
Glucose, Bld: 130 mg/dL — ABNORMAL HIGH (ref 70–99)
Glucose, Bld: 131 mg/dL — ABNORMAL HIGH (ref 70–99)
Glucose, Bld: 140 mg/dL — ABNORMAL HIGH (ref 70–99)
Glucose, Bld: 147 mg/dL — ABNORMAL HIGH (ref 70–99)
Glucose, Bld: 186 mg/dL — ABNORMAL HIGH (ref 70–99)
Glucose, Bld: 188 mg/dL — ABNORMAL HIGH (ref 70–99)
Potassium: 3.3 mEq/L — ABNORMAL LOW (ref 3.5–5.1)
Potassium: 3.3 meq/L — ABNORMAL LOW (ref 3.5–5.1)
Potassium: 3.6 mEq/L (ref 3.5–5.1)
Potassium: 3.7 mEq/L (ref 3.5–5.1)
Potassium: 4.1 mEq/L (ref 3.5–5.1)
Potassium: 4.3 mEq/L (ref 3.5–5.1)
Potassium: 4.3 mEq/L (ref 3.5–5.1)
Potassium: 4.3 mEq/L (ref 3.5–5.1)
Potassium: 4.4 mEq/L (ref 3.5–5.1)
Sodium: 141 mEq/L (ref 135–145)
Sodium: 144 mEq/L (ref 135–145)
Sodium: 144 mEq/L (ref 135–145)
Sodium: 144 mEq/L (ref 135–145)
Sodium: 145 mEq/L (ref 135–145)

## 2011-03-15 LAB — PREPARE FRESH FROZEN PLASMA

## 2011-03-15 LAB — POCT I-STAT 3, ART BLOOD GAS (G3+)
Acid-Base Excess: 1 mmol/L (ref 0.0–2.0)
Acid-Base Excess: 1 mmol/L (ref 0.0–2.0)
Acid-Base Excess: 3 mmol/L — ABNORMAL HIGH (ref 0.0–2.0)
Acid-Base Excess: 7 mmol/L — ABNORMAL HIGH (ref 0.0–2.0)
Bicarbonate: 25.1 meq/L — ABNORMAL HIGH (ref 20.0–24.0)
Bicarbonate: 26.4 meq/L — ABNORMAL HIGH (ref 20.0–24.0)
Bicarbonate: 30.2 meq/L — ABNORMAL HIGH (ref 20.0–24.0)
O2 Saturation: 88 %
O2 Saturation: 98 %
O2 Saturation: 99 %
Patient temperature: 37.5
Patient temperature: 37.6
Patient temperature: 38
Patient temperature: 38.8
Patient temperature: 98.9
TCO2: 20 mmol/L (ref 0–100)
TCO2: 24 mmol/L (ref 0–100)
TCO2: 25 mmol/L (ref 0–100)
TCO2: 27 mmol/L (ref 0–100)
TCO2: 29 mmol/L (ref 0–100)
pCO2 arterial: 33.9 mmHg — ABNORMAL LOW (ref 35.0–45.0)
pCO2 arterial: 34.9 mmHg — ABNORMAL LOW (ref 35.0–45.0)
pCO2 arterial: 35.7 mmHg (ref 35.0–45.0)
pCO2 arterial: 36.7 mmHg (ref 35.0–45.0)
pCO2 arterial: 40.5 mmHg (ref 35.0–45.0)
pH, Arterial: 7.304 — ABNORMAL LOW (ref 7.350–7.450)
pH, Arterial: 7.369 (ref 7.350–7.450)
pH, Arterial: 7.431 (ref 7.350–7.450)
pH, Arterial: 7.447 (ref 7.350–7.450)
pH, Arterial: 7.481 — ABNORMAL HIGH (ref 7.350–7.450)
pO2, Arterial: 51 mmHg — ABNORMAL LOW (ref 80.0–100.0)
pO2, Arterial: 97 mmHg (ref 80.0–100.0)

## 2011-03-15 LAB — POCT I-STAT 7, (LYTES, BLD GAS, ICA,H+H)
Bicarbonate: 16 meq/L — ABNORMAL LOW (ref 20.0–24.0)
Hemoglobin: 13.3 g/dL (ref 13.0–17.0)
O2 Saturation: 93 %
Potassium: 3.3 meq/L — ABNORMAL LOW (ref 3.5–5.1)
Sodium: 144 meq/L (ref 135–145)
TCO2: 18 mmol/L (ref 0–100)
pH, Arterial: 7.143 — CL (ref 7.350–7.450)

## 2011-03-15 LAB — CULTURE, BLOOD (ROUTINE X 2)
Culture: NO GROWTH
Culture: NO GROWTH
Culture: NO GROWTH

## 2011-03-15 LAB — DIFFERENTIAL
Basophils Absolute: 0 10*3/uL (ref 0.0–0.1)
Basophils Absolute: 0 10*3/uL (ref 0.0–0.1)
Basophils Absolute: 0 10*3/uL (ref 0.0–0.1)
Basophils Absolute: 0.1 10*3/uL (ref 0.0–0.1)
Basophils Absolute: 0.1 10*3/uL (ref 0.0–0.1)
Basophils Relative: 0 % (ref 0–1)
Basophils Relative: 0 % (ref 0–1)
Basophils Relative: 1 % (ref 0–1)
Blasts: 0 %
Eosinophils Absolute: 0.3 10*3/uL (ref 0.0–0.7)
Eosinophils Absolute: 0.3 10*3/uL (ref 0.0–0.7)
Eosinophils Relative: 1 % (ref 0–5)
Eosinophils Relative: 2 % (ref 0–5)
Eosinophils Relative: 2 % (ref 0–5)
Lymphocytes Relative: 11 % — ABNORMAL LOW (ref 12–46)
Lymphocytes Relative: 4 % — ABNORMAL LOW (ref 12–46)
Lymphocytes Relative: 9 % — ABNORMAL LOW (ref 12–46)
Lymphs Abs: 0.8 10*3/uL (ref 0.7–4.0)
Lymphs Abs: 1 10*3/uL (ref 0.7–4.0)
Lymphs Abs: 1.3 10*3/uL (ref 0.7–4.0)
Lymphs Abs: 2.6 10*3/uL (ref 0.7–4.0)
Monocytes Absolute: 0.6 10*3/uL (ref 0.1–1.0)
Monocytes Relative: 7 % (ref 3–12)
Monocytes Relative: 8 % (ref 3–12)
Monocytes Relative: 9 % (ref 3–12)
Monocytes Relative: 9 % (ref 3–12)
Monocytes Relative: 9 % (ref 3–12)
Neutro Abs: 12.3 10*3/uL — ABNORMAL HIGH (ref 1.7–7.7)
Neutro Abs: 30.3 10*3/uL — ABNORMAL HIGH (ref 1.7–7.7)
Neutro Abs: 6.4 10*3/uL (ref 1.7–7.7)
Neutro Abs: 7.7 10*3/uL (ref 1.7–7.7)
Neutrophils Relative %: 67 % (ref 43–77)
Neutrophils Relative %: 77 % (ref 43–77)
Neutrophils Relative %: 82 % — ABNORMAL HIGH (ref 43–77)
Neutrophils Relative %: 86 % — ABNORMAL HIGH (ref 43–77)
Promyelocytes Absolute: 0 %

## 2011-03-15 LAB — PREPARE PLATELETS

## 2011-03-15 LAB — POCT I-STAT, CHEM 8
Calcium, Ion: 0.75 mmol/L — ABNORMAL LOW (ref 1.12–1.32)
Chloride: 106 meq/L (ref 96–112)
Glucose, Bld: 186 mg/dL — ABNORMAL HIGH (ref 70–99)
HCT: 50 % (ref 39.0–52.0)
Hemoglobin: 17 g/dL (ref 13.0–17.0)
Potassium: 3.6 meq/L (ref 3.5–5.1)

## 2011-03-15 LAB — URINALYSIS, MICROSCOPIC ONLY
Nitrite: NEGATIVE
Protein, ur: 30 mg/dL — AB
Specific Gravity, Urine: 1.024 (ref 1.005–1.030)
Urobilinogen, UA: 1 mg/dL (ref 0.0–1.0)

## 2011-03-15 LAB — CROSSMATCH
ABO/RH(D): A NEG
ABO/RH(D): A NEG
ABO/RH(D): A NEG
ABO/RH(D): A NEG
Antibody Screen: NEGATIVE
Antibody Screen: NEGATIVE
Antibody Screen: NEGATIVE

## 2011-03-15 LAB — APTT
aPTT: 30 seconds (ref 24–37)
aPTT: 32 s (ref 24–37)
aPTT: 42 seconds — ABNORMAL HIGH (ref 24–37)

## 2011-03-15 LAB — URINALYSIS, ROUTINE W REFLEX MICROSCOPIC
Bilirubin Urine: NEGATIVE
Bilirubin Urine: NEGATIVE
Glucose, UA: NEGATIVE mg/dL
Leukocytes, UA: NEGATIVE
Nitrite: NEGATIVE
Nitrite: NEGATIVE
Specific Gravity, Urine: 1.016 (ref 1.005–1.030)
Specific Gravity, Urine: <1.005 — ABNORMAL LOW (ref 1.005–1.030)
Urobilinogen, UA: 0.2 mg/dL (ref 0.0–1.0)
pH: 6.5 (ref 5.0–8.0)
pH: 6.5 (ref 5.0–8.0)

## 2011-03-15 LAB — URINE CULTURE: Culture: NO GROWTH

## 2011-03-15 LAB — PROTIME-INR
INR: 1.3 (ref 0.00–1.49)
INR: 1.3 (ref 0.00–1.49)
INR: 1.4 (ref 0.00–1.49)
INR: 1.4 (ref 0.00–1.49)
INR: 1.4 (ref 0.00–1.49)
INR: 1.4 (ref 0.00–1.49)
INR: 1.5 (ref 0.00–1.49)
INR: 1.7 — ABNORMAL HIGH (ref 0.00–1.49)
INR: 2 — ABNORMAL HIGH (ref 0.00–1.49)
Prothrombin Time: 16.5 seconds — ABNORMAL HIGH (ref 11.6–15.2)
Prothrombin Time: 17.3 seconds — ABNORMAL HIGH (ref 11.6–15.2)
Prothrombin Time: 20.5 seconds — ABNORMAL HIGH (ref 11.6–15.2)
Prothrombin Time: 23.9 s — ABNORMAL HIGH (ref 11.6–15.2)
Prothrombin Time: 24.3 s — ABNORMAL HIGH (ref 11.6–15.2)

## 2011-03-15 LAB — MRSA CULTURE

## 2011-03-15 LAB — MAGNESIUM
Magnesium: 1.7 mg/dL (ref 1.5–2.5)
Magnesium: 2 mg/dL (ref 1.5–2.5)
Magnesium: 2.2 mg/dL (ref 1.5–2.5)
Magnesium: 2.3 mg/dL (ref 1.5–2.5)
Magnesium: 2.7 mg/dL — ABNORMAL HIGH (ref 1.5–2.5)
Magnesium: 2.7 mg/dL — ABNORMAL HIGH (ref 1.5–2.5)

## 2011-03-15 LAB — RAPID URINE DRUG SCREEN, HOSP PERFORMED
Amphetamines: NOT DETECTED
Barbiturates: NOT DETECTED
Benzodiazepines: NOT DETECTED
Cocaine: NOT DETECTED
Tetrahydrocannabinol: NOT DETECTED
Tetrahydrocannabinol: NOT DETECTED

## 2011-03-15 LAB — POCT I-STAT 4, (NA,K, GLUC, HGB,HCT)
Glucose, Bld: 85 mg/dL (ref 70–99)
Potassium: 3.6 meq/L (ref 3.5–5.1)

## 2011-03-15 LAB — PHOSPHORUS
Phosphorus: 1.7 mg/dL — ABNORMAL LOW (ref 2.3–4.6)
Phosphorus: 2.4 mg/dL (ref 2.3–4.6)
Phosphorus: 2.6 mg/dL (ref 2.3–4.6)
Phosphorus: 3.7 mg/dL (ref 2.3–4.6)
Phosphorus: 3.7 mg/dL (ref 2.3–4.6)

## 2011-03-15 LAB — VANCOMYCIN, TROUGH
Vancomycin Tr: 14.8 ug/mL (ref 10.0–20.0)
Vancomycin Tr: 15.4 ug/mL (ref 10.0–20.0)
Vancomycin Tr: 17.4 ug/mL (ref 10.0–20.0)

## 2011-03-15 LAB — ETHANOL
Alcohol, Ethyl (B): 260 mg/dL — ABNORMAL HIGH (ref 0–10)
Alcohol, Ethyl (B): 344 mg/dL — ABNORMAL HIGH (ref 0–10)

## 2011-03-15 LAB — URINE MICROSCOPIC-ADD ON

## 2011-03-15 LAB — TRIGLYCERIDES
Triglycerides: 47 mg/dL (ref <150)
Triglycerides: 65 mg/dL (ref <150)

## 2011-03-15 LAB — LIPASE, BLOOD: Lipase: 477 U/L — ABNORMAL HIGH (ref 11–59)

## 2011-03-15 LAB — LACTIC ACID, PLASMA
Lactic Acid, Venous: 5.3 mmol/L — ABNORMAL HIGH (ref 0.5–2.2)
Lactic Acid, Venous: 6.6 mmol/L — ABNORMAL HIGH (ref 0.5–2.2)

## 2011-03-15 LAB — MRSA PCR SCREENING: MRSA by PCR: NEGATIVE

## 2011-03-15 LAB — ABO/RH: ABO/RH(D): A NEG

## 2011-03-15 LAB — CALCIUM, IONIZED: Calcium, Ion: 1.16 mmol/L (ref 1.12–1.32)

## 2011-03-15 LAB — CHOLESTEROL, TOTAL: Cholesterol: 63 mg/dL (ref 0–200)

## 2011-03-16 LAB — CBC
HCT: 52.8 % — ABNORMAL HIGH (ref 39.0–52.0)
Hemoglobin: 17.7 g/dL — ABNORMAL HIGH (ref 13.0–17.0)
MCHC: 33.5 g/dL (ref 30.0–36.0)
MCV: 87.9 fL (ref 78.0–100.0)
RBC: 6.01 MIL/uL — ABNORMAL HIGH (ref 4.22–5.81)

## 2011-03-16 LAB — DIFFERENTIAL
Basophils Relative: 1 % (ref 0–1)
Eosinophils Absolute: 0.1 10*3/uL (ref 0.0–0.7)
Lymphs Abs: 2.8 10*3/uL (ref 0.7–4.0)
Monocytes Absolute: 0.7 10*3/uL (ref 0.1–1.0)
Monocytes Relative: 10 % (ref 3–12)

## 2011-03-16 LAB — BASIC METABOLIC PANEL
CO2: 27 mEq/L (ref 19–32)
Chloride: 107 mEq/L (ref 96–112)
Creatinine, Ser: 0.8 mg/dL (ref 0.4–1.5)
GFR calc Af Amer: >60 mL/min (ref >60)
Potassium: 3.4 mEq/L — ABNORMAL LOW (ref 3.5–5.1)
Sodium: 143 mEq/L (ref 135–145)
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
## 2011-04-21 NOTE — Op Note (Signed)
Thomas [name], Thomas [name]               ACCOUNT NO.:  [id number]   MEDICAL RECORD NO.:  [id number]          PATIENT TYPE:  INP   LOCATION:  2311                         FACILITY:  [facility]   PHYSICIAN:  Cherylynn Ridges, M.D.    DATE OF BIRTH:  11-10-1971   DATE OF PROCEDURE:  06/15/2009  DATE OF DISCHARGE:                               OPERATIVE REPORT   PREOPERATIVE DIAGNOSIS:  Hemoperitoneum with liver laceration and  probable duodenal blowout.   POSTOPERATIVE DIAGNOSES:  1. Grade 5 liver laceration into hilum.  2. Duodenal blowout in the second portion of the duodenum.  3. Iatrogenic injury of small bowel x1 and the colonic interposition      of the esophagus.   ADDITIONAL DIAGNOSES:  Right pneumothorax.   PROCEDURES:  1. Exploratory laparotomy.  2. Hepatorrhaphy.  3. Repair of the duodenum.  4. Repair of iatrogenic bowel injury.  5. Placement of right chest tube, 36-French.   SURGEON:  Cherylynn Ridges, MD   ASSISTANT:  1. Lorne Skeens. Hoxworth, MD  2. Shawn Rayburn, PA   ANESTHESIA:  General endotracheal.   ESTIMATED BLOOD LOSS:  3300 mL.  He received 5 units of Cell Saver  blood, 5 units of blood bank blood, several liters of crystalloid, 2  units of FFP, and he will receive platelets.   COMPLICATIONS:  Hypotension and shock.   CONDITION:  Critical.   OPERATION:  The patient was taken to the operating room and placed on  table in the supine position.  After an adequate general anesthetic was  administered, he had been intubated prior to going to the operating  room.  He was prepped after placement of a right tube thoracostomy 43-  [place] through the fifth and sixth intercostal space.   The chest tube on the right side was secured in place with 2-0 silk.  We  prepped and draped for an exploratory laparotomy and made a midline  incision from the xiphoid all the way down towards pubic crest.  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  There was virtually no free plane throughout the abdominal cavity from  the xiphoid all the way down to the pelvis.  As we entered through the  fascia down and lower portion of the abdomen below the umbilicus, there  was a lateral wall small bowel injury which we subsequently repaired  with interrupted 3-0 silk pop-offs.  In the upper portion of the abdomen  as we got into a plane, there was a massive amount of bleeding from the  liver.  We had to dissect away the bowel and the previous adhesions to  the anterior abdominal wall, from the anterior abdominal wall in order  to gain access to what appeared to be a grade 5 liver laceration.   Upon inspecting liver laceration, there was extravasation of bleeding  significantly from the splayed edges of the liver which were primarily  on the right lobe anterior and had a deep towards the hilum.  We could  not perform a  formal mobilization of liver or Kocher maneuver because of  the number of adhesions from before.  Also, we could not mobilize on the  left side of the incision because of previous gastrostomy tube and its  colonic interposition on that side and attempting to do so we did get  into the colonic portion of the replaced esophagus which we subsequently  repaired with 3-0 silk pop-off sutures.   The liver laceration could not be controlled in the typical manner with  a Pringle maneuver because we could not get down to the foramen of  W[place] and mobilized the hepatic duodenal ligament.  We controlled  primarily with pressure and placed some horizontal mattress sutures on  the each side of liver, controlled the bleeding from the overtly  bleeding of vessels that we torn with the laceration.  We then placed in  RDS bandage into the mid face of the laceration folded upon itself and  then sutured that in place with figure-of-eight stitches of 0 chromic  sutures.  This  appeared to control the bleeding well and lap tape was  placed down towards the hilum and also on top of the liver in order to  compresses area.   The liver laceration was controlled first.  The damaged colonic  interposition was repaired along with the iatrogenic small bowel injury  after we had to repair the liver.   Second of all, we did repair the duodenal blowout which we could see  going laterally.  We could not perform a formal Kocher maneuver because  of the amount of adhesions.  However, we mobilized enough in order to do  a lateral wall repair with interrupted 3-0 silk sutures of the 2 cm to 3  cm hole in the duodenum.  We did not leave a drain in place because of  plans to put [facility] in place to try to get the patient stable as we  controlled the bleeding and as we can repair it controls coagulopathy.  Once the duodenum was repaired, the iatrogenic injuries were repaired.  The liver was repaired.  We placed a VAC dressing in the wound with 2  outer sponges.  The patient was stable hemodynamically subsequently.  Chest tube dressings were placed.   The patient also needed an arterial line and left femoral arterial line  was placed by Dr. Lindie Spruce using the Seldinger technique and secured in  place with a 3-0 silk suture.  All counts were not appropriate as the  lap tapes were intentionally left in the peritoneal cavity along with an  RDS bandage.      Cherylynn Ridges, M.D.  Electronically Signed     JOW/MEDQ  D:  06/15/2009  T:  06/16/2009  Job:  [id number]
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
## 2011-04-21 NOTE — Op Note (Signed)
Thomas [name], Thomas [name]               ACCOUNT NO.:  [id number]   MEDICAL RECORD NO.:  [id number]          PATIENT TYPE:  INP   LOCATION:  2311                         FACILITY:  [facility]   PHYSICIAN:  Suzanna Obey, M.D.       DATE OF BIRTH:  03-14-1971   DATE OF PROCEDURE:  06/15/2009  DATE OF DISCHARGE:                               OPERATIVE REPORT   PREOPERATIVE DIAGNOSIS:  Chin and lower lip gingival laceration, 3-cm  chin laceration.   ANESTHESIA:  General.   PROCEDURE:  Closure of chin and lip laceration.   INDICATIONS:  This is a 40 year old who is in surgery via general  surgery for liver lacerations and extensive bleeding throughout the  abdomen in the emergency fashion.  His lip and chin are to be closed  during this case to get him out of the OR to not delay any extended time  in the OR from the closure, so it was closed while the patient was  draped for the abdominal procedure.  There was no consent as the patient  was emergent and was already asleep at the time of my evaluation.   OPERATION:  The patient was taken to the operating room.  He was already  under general anesthesia.  Under the drape, the chin wound was irrigated  with saline, cleaned with sterile towels and then the chin laceration  was closed with interrupted 4-0 chromic and running 5-0 nylon.  Inside  the mouth, the mucosa was disrupted off the vault of the gingiva along  the teeth and has a very complex multiple lacerations that were closed  with interrupted 3-0 chromic.  The attachment of the gingiva to the  teeth and alveolar ridge area was left unrepaired as there was no  remaining gingiva to really close to it had been ripped off right at the  base of the teeth.  There does not appear to be any fractures of the  bone.  This was left to heal by secondary intention, and it was very  difficult visualization under the drape with general surgeries repairing  the liver that this was the best closure that could  be provided at this  time.  The wound was again irrigated, and the patient then continued  with his general operation for the abdominal injuries.           ______________________________  Suzanna Obey, M.D.     JB/MEDQ  D:  06/15/2009  T:  06/16/2009  Job:  [id number]

## 2011-04-21 NOTE — Op Note (Signed)
Thomas [name], Thomas [name]               ACCOUNT NO.:  [id number]   MEDICAL RECORD NO.:  [id number]          PATIENT TYPE:  INP   LOCATION:  2311                         FACILITY:  [facility]   PHYSICIAN:  Cherylynn Ridges, M.D.    DATE OF BIRTH:  03/25/1971   DATE OF PROCEDURE:  06/24/2009  DATE OF DISCHARGE:                               OPERATIVE REPORT   PREOPERATIVE DIAGNOSIS:  Open abdomen and VAC dressing in place enteric  drainage of the wound.   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   PROCEDURE:  1. Repair of gastrostomy tube.  2. Placement of jejunostomy tube.  3. Replacement of VAC dressing.   SURGEON:  Cherylynn Ridges, MD   ASSISTANT:  Lazaro Arms, PA   ANESTHESIA:  General endotracheal.   ESTIMATED BLOOD LOSS:  Less than 30 mL.   COMPLICATIONS:  None.   CONDITION:  Stable.   FINDINGS:  The patient's G tube was partially out of his gastrostomy due  to gastrotomy placement with really deteriorated tissue around that  area.  The small bowel enteric drainage was coming from a small hole in  the mid jejunum and the lower portion of the wound where we placed a red  Robinson catheter for a subsequent drainage and perhaps feeding down the  road.   OPERATION:  The patient was taken to the operating room and placed on  table in supine position.  He was intubated and brought directly back to  the OR was given inhalation anesthetics and paralytics and then prepped  after removing the previous VAC dressing.   There was a large amount of thickened enteric drainage from the wound.  There was one occasion after irrigating demonstrated coming from a small  bowel fistula in the low portion of the abdominal wound.  To control  this we placed a 12-French red Robinson catheter where we cut several  additional holes approximately four additional holes into it, fed it  what  we think was distally secured in place with pursestring suture of 2-  0 silk.  We brought it out the left lower quadrant of the abdomen and  secured into place with 3-0 nylon.   We irrigated the jejunostomy tube and there was no saline drainage.   We inspected the rest of the abdomen.  The [place] drain was in the usual  place near the duodenostomy that did not appear to be any bilious  drainage there.  The G tube was partially out of the stomach.  The  mushroom part of the catheter was protruding from the stomach as we  irrigated with saline.  It flowed out into the peritoneal cavity.  We  were able to release the G-tube from its abdominal wall attachment, then  passed the catheter further into the stomach and then secured in place  with pair stitches of 2-0 silk that cinched up the hole around the  gastrotomy.  We then resecured it to the abdominal wall using a 2-0  nylon.  Once all this was done we irrigated with about 2 liters of  saline and replaced the VAC dressing using a plastic coated black foam,  in addition another black foam and then the suction device on top.  All  counts were correct.      Cherylynn Ridges, M.D.  Electronically Signed     JOW/MEDQ  D:  06/24/2009  T:  06/25/2009  Job:  [id number]
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
## 2011-08-28 LAB — CBC
HCT: 52.9 — ABNORMAL HIGH
MCHC: 33
MCV: 86.6
Platelets: 220

## 2011-08-28 LAB — DIFFERENTIAL
Basophils Absolute: 0
Eosinophils Relative: 1
Lymphocytes Relative: 18
Lymphs Abs: 1.8
Neutro Abs: 7.4

## 2011-08-28 LAB — COMPREHENSIVE METABOLIC PANEL
AST: 20
Albumin: 4
BUN: 3 — ABNORMAL LOW
CO2: 28
Calcium: 8.9
Creatinine, Ser: 0.83
GFR calc Af Amer: >60
GFR calc non Af Amer: >60
Total Bilirubin: 0.9

## 2011-09-16 LAB — GC/CHLAMYDIA PROBE AMP, GENITAL
Chlamydia, DNA Probe: NEGATIVE
GC Probe Amp, Genital: NEGATIVE

## 2011-09-16 LAB — URINALYSIS, ROUTINE W REFLEX MICROSCOPIC
Glucose, UA: NEGATIVE
Ketones, ur: NEGATIVE
Nitrite: NEGATIVE
Specific Gravity, Urine: <1.005 — ABNORMAL LOW
pH: 5.5

## 2011-09-16 LAB — URINE MICROSCOPIC-ADD ON

## 2011-09-16 LAB — RPR: RPR Ser Ql: NONREACTIVE

## 2011-09-21 LAB — RAPID URINE DRUG SCREEN, HOSP PERFORMED
Amphetamines: NOT DETECTED
Barbiturates: NOT DETECTED
Opiates: NOT DETECTED

## 2011-09-21 LAB — DIFFERENTIAL
Eosinophils Relative: 3
Lymphocytes Relative: 34
Monocytes Absolute: 0.8 — ABNORMAL HIGH
Monocytes Relative: 10
Neutro Abs: 4.5

## 2011-09-21 LAB — URINALYSIS, ROUTINE W REFLEX MICROSCOPIC
Hgb urine dipstick: NEGATIVE
Nitrite: NEGATIVE
Protein, ur: NEGATIVE
Specific Gravity, Urine: <1.005 — ABNORMAL LOW
Urobilinogen, UA: 0.2

## 2011-09-21 LAB — BASIC METABOLIC PANEL
CO2: 24
Calcium: 9.5
GFR calc Af Amer: >60
GFR calc non Af Amer: >60
Potassium: 4.1
Sodium: 141

## 2011-09-21 LAB — CBC
HCT: 51.9
Hemoglobin: 16.8
MCHC: 32.4
RBC: 6.19 — ABNORMAL HIGH

## 2011-11-04 ENCOUNTER — Encounter: Payer: Self-pay | Admitting: *Deleted

## 2011-11-04 ENCOUNTER — Emergency Department (HOSPITAL_COMMUNITY)
Admission: EM | Admit: 2011-11-04 | Discharge: 2011-11-04 | Disposition: A | Discharge status: Home / Self Care | Payer: Medicaid Other | Attending: Emergency Medicine | Admitting: Emergency Medicine

## 2011-11-04 DIAGNOSIS — Z87891 Personal history of nicotine dependence: Secondary | ICD-10-CM | POA: Insufficient documentation

## 2011-11-04 DIAGNOSIS — L03119 Cellulitis of unspecified part of limb: Secondary | ICD-10-CM | POA: Insufficient documentation

## 2011-11-04 DIAGNOSIS — L02419 Cutaneous abscess of limb, unspecified: Secondary | ICD-10-CM | POA: Insufficient documentation

## 2011-11-04 DIAGNOSIS — L03115 Cellulitis of right lower limb: Secondary | ICD-10-CM

## 2011-11-04 DIAGNOSIS — L0231 Cutaneous abscess of buttock: Secondary | ICD-10-CM | POA: Insufficient documentation

## 2011-11-04 HISTORY — DX: Cutaneous abscess, unspecified: L02.91

## 2011-11-04 MED ORDER — OXYCODONE-ACETAMINOPHEN 5-325 MG PO TABS: 1.0000 | ORAL_TABLET | ORAL | Status: AC | PRN

## 2011-11-04 MED ORDER — LIDOCAINE-EPINEPHRINE 2 %-1:100000 IJ SOLN: 20.0000 mL | Freq: Once | INTRAMUSCULAR | Status: DC

## 2011-11-04 MED ORDER — DOXYCYCLINE HYCLATE 100 MG PO CAPS: 100.0000 mg | ORAL_CAPSULE | Freq: Two times a day (BID) | ORAL | Status: AC

## 2011-11-04 MED ORDER — LIDOCAINE-EPINEPHRINE (PF) 2 %-1:200000 IJ SOLN
INTRAMUSCULAR | Status: AC
  Filled 2011-11-04: qty 20

## 2011-11-04 MED ORDER — HYDROMORPHONE HCL PF 1 MG/ML IJ SOLN
1.0000 mg | Freq: Once | INTRAMUSCULAR | Status: AC
  Administered 2011-11-04: 1 mg via INTRAMUSCULAR
  Filled 2011-11-04: qty 1

## 2011-11-04 NOTE — ED Provider Notes (Signed)
History     CSN: [id number] Arrival date & time: 11/04/2011 11:39 AM   First MD Initiated Contact with Patient 11/04/11 1320      Chief Complaint  Patient presents with  . Abscess    (Consider location/radiation/quality/duration/timing/severity/associated sxs/prior treatment) Patient is a 40 y.o. male presenting with abscess. The history is provided by the patient.  Abscess  The current episode started more than one week ago. The onset was gradual. The abscess is present on the left buttock and right lower leg. Pertinent negatives include no fever and no cough.   patient has abscesses to his left buttock and right shin. He states the left buttock has been draining a little. He states the right shin history little but stopped. No fevers. No numbness weakness. He states he cannot walk on his right lower leg due to the pain. He states he has had an abscess previously. No chills. He began several days ago.  Past Medical History  Diagnosis Date  . Abscess     Past Surgical History  Procedure Date  . Abdominal surgery     History reviewed. No pertinent family history.  History  Substance Use Topics  . Smoking status: Former Games developer  . Smokeless tobacco: Not on file  . Alcohol Use: Yes     occasional      Review of Systems  Constitutional: Negative for fever, chills and fatigue.  Eyes: Negative for pain.  Respiratory: Negative for cough.   Gastrointestinal: Negative for nausea and abdominal pain.  Skin: Positive for color change.  Neurological: Negative for weakness.  Hematological: Negative for adenopathy.    Allergies  Aspirin and Penicillins  Home Medications   Current Outpatient Rx  Name Route Sig Dispense Refill  . ACETAMINOPHEN 325 MG PO TABS Oral Take 650 mg by mouth every 6 (six) hours as needed. pain     . DOXYCYCLINE HYCLATE 100 MG PO CAPS Oral Take 1 capsule (100 mg total) by mouth 2 (two) times daily. 20 capsule 0  . OXYCODONE-ACETAMINOPHEN 5-325 MG PO  TABS Oral Take 1 tablet by mouth every 4 (four) hours as needed for pain. 15 tablet 0    BP 124/82  Pulse 108  Temp(Src) 98.6 F (37 C) (Oral)  Resp 18  Ht 5\' 7"  (1.702 m)  Wt 148 lb (67.132 kg)  BMI 23.18 kg/m2  SpO2 98%  Physical Exam  Constitutional: He appears well-developed and well-nourished.  HENT:  Head: Normocephalic.  Eyes: Pupils are equal, round, and reactive to light.  Cardiovascular: Regular rhythm.   Pulmonary/Chest: Effort normal.  Abdominal: Soft. There is no tenderness.  Musculoskeletal: Normal range of motion. He exhibits tenderness.  Skin:       Left buttock near leg has approximately 3 cm tender indurated area with central purulent drainage. Minimal drainage. Right lower leg superiorly has approximately 1 cm central scab area. Approximately 3 cm round and tender indurated area without fluctuance. Around that there is another 4 cm area of lighter erythema without induration.    ED Course  Procedures (including critical care time)  Labs Reviewed - No data to display No results found.   1. Abscess of left buttock   2. Cellulitis of right lower extremity    INCISION AND DRAINAGE Performed by: Billee Cashing. Consent: Verbal consent obtained. Risks and benefits: risks, benefits and alternatives were discussed Type: abscess  Body area:  left by Dr.   Anesthesia: local infiltration  Local anesthetic: lidocaine 2% with  epinephrine  Anesthetic  total: 6 ml  Complexity: complex Blunt dissection to break up loculations  Drainage: purulent  Drainage amount:Moderate  Packing material: 1/4 in iodoform gauze  Patient tolerance: Patient tolerated the procedure well with no immediate complications.  A timeout was called before the procedure.    MDM  Patient presents with cellulitis of his right lower leg. This does not appear any drainage. It is however large will need a recheck in about 2 days. His been started on antibiotics. He also has an  abscess on his left buttock. This was drained with some purulent return. Was packed. It can be removed in 2 days during the recheck for cellulitis. His been given antibiotics and pain meds.        Juliet Rude. Rubin Payor, MD 11/04/11 1525

## 2011-11-04 NOTE — ED Notes (Signed)
Patient with no complaints at this time. Respirations even and unlabored. Skin warm/dry. Discharge instructions reviewed with patient at this time. Patient given opportunity to voice concerns/ask questions. Patient discharged at this time and left Emergency Department with steady gait.   

## 2011-11-04 NOTE — ED Notes (Signed)
Pt c/o pain to right lower leg and left buttock. Pt has large swollen and red areas to right shin and left buttock.

## 2012-01-28 ENCOUNTER — Emergency Department (HOSPITAL_COMMUNITY)
Admission: EM | Admit: 2012-01-28 | Discharge: 2012-01-28 | Disposition: A | Discharge status: Home / Self Care | Payer: Medicaid Other | Attending: Emergency Medicine | Admitting: Emergency Medicine

## 2012-01-28 ENCOUNTER — Encounter (HOSPITAL_COMMUNITY): Payer: Self-pay

## 2012-01-28 DIAGNOSIS — L0231 Cutaneous abscess of buttock: Secondary | ICD-10-CM | POA: Insufficient documentation

## 2012-01-28 DIAGNOSIS — IMO0001 Reserved for inherently not codable concepts without codable children: Secondary | ICD-10-CM | POA: Insufficient documentation

## 2012-01-28 LAB — GLUCOSE, CAPILLARY: Glucose-Capillary: 96 mg/dL (ref 70–99)

## 2012-01-28 MED ORDER — LIDOCAINE-EPINEPHRINE (PF) 2 %-1:200000 IJ SOLN
INTRAMUSCULAR | Status: AC
  Administered 2012-01-28: 20 mL
  Filled 2012-01-28: qty 20

## 2012-01-28 MED ORDER — OXYCODONE-ACETAMINOPHEN 5-325 MG PO TABS: 1.0000 | ORAL_TABLET | ORAL | Status: DC | PRN

## 2012-01-28 MED ORDER — HYDROCODONE-ACETAMINOPHEN 5-325 MG PO TABS: 1.0000 | ORAL_TABLET | ORAL | Status: AC | PRN

## 2012-01-28 MED ORDER — HYDROMORPHONE HCL PF 1 MG/ML IJ SOLN
1.0000 mg | Freq: Once | INTRAMUSCULAR | Status: AC
  Administered 2012-01-28: 1 mg via INTRAVENOUS
  Filled 2012-01-28: qty 1

## 2012-01-28 MED ORDER — HYDROMORPHONE HCL PF 1 MG/ML IJ SOLN
INTRAMUSCULAR | Status: AC
  Filled 2012-01-28: qty 1

## 2012-01-28 MED ORDER — VANCOMYCIN HCL IN DEXTROSE 1-5 GM/200ML-% IV SOLN
1000.0000 mg | Freq: Once | INTRAVENOUS | Status: AC
  Administered 2012-01-28: 1000 mg via INTRAVENOUS
  Filled 2012-01-28: qty 200

## 2012-01-28 MED ORDER — SODIUM CHLORIDE 0.9 % IV SOLN: Freq: Once | INTRAVENOUS | Status: DC

## 2012-01-28 MED ORDER — LIDOCAINE-EPINEPHRINE 2-1:100000 % IJ SOLN: 1.0000 mg | Freq: Once | INTRAMUSCULAR | Status: DC

## 2012-01-28 MED ORDER — DOXYCYCLINE HYCLATE 100 MG PO CAPS: 100.0000 mg | ORAL_CAPSULE | Freq: Two times a day (BID) | ORAL | Status: AC

## 2012-01-28 NOTE — ED Notes (Signed)
Complain of boil. pain

## 2012-01-28 NOTE — ED Provider Notes (Signed)
History   This chart was scribed for Joya Gaskins, MD by Clarita Crane. The patient was seen in room APA03/APA03. Patient's care was started at 0730.   CSN: [id number]  Arrival date & time 01/28/12  0730   First MD Initiated Contact with Patient 01/28/12 [phone]      Chief Complaint  Patient presents with  . Recurrent Skin Infections    HPI Thomas [name] is a 41 y.o. male who presents to the Emergency Department complaining of moderate to severe boil to left inferior buttocks with associated pain to region of boil onset approximately 1 week ago and worsening since. States pain is aggravated with palpation and relieved by nothing. Patient notes having similar boil several weeks ago which was relieved with incision and drainage procedure performed in ED. Denies fever, chills, nausea, vomiting.  HPI ELEMENTS: Location: left inferior buttocks  Onset: 1 week ago Duration: worsening since onset Severity: moderate to severe  Associated symptoms: +pain to region of boil. Denies fever, chills, nausea, vomiting.   Past Medical History  Diagnosis Date  . Abscess     Past Surgical History  Procedure Date  . Abdominal surgery     History reviewed. No pertinent family history.  History  Substance Use Topics  . Smoking status: Former Games developer  . Smokeless tobacco: Current User  . Alcohol Use: Yes     occasional      Review of Systems 10 Systems reviewed and are negative for acute change except as noted in the HPI.  Allergies  Aspirin and Penicillins  Home Medications   Current Outpatient Rx  Name Route Sig Dispense Refill  . ACETAMINOPHEN 325 MG PO TABS Oral Take 650 mg by mouth every 6 (six) hours as needed. pain       Ht 5\' 9"  (1.753 m)  Wt 145 lb (65.772 kg)  BMI 21.41 kg/m2 BP 103/57  Pulse 91  Temp(Src) 99.1 F (37.3 C) (Oral)  Resp 20  Ht 5\' 9"  (1.753 m)  Wt 145 lb (65.772 kg)  BMI 21.41 kg/m2  SpO2 100%   Physical Exam CONSTITUTIONAL: Well  developed/well nourished HEAD AND FACE: Normocephalic/atraumatic EYES: EOMI/PERRL ENMT: Mucous membranes moist NECK: supple no meningeal signs CV: S1/S2 noted, no murmurs/rubs/gallops noted LUNGS: Lungs are clear to auscultation bilaterally, no apparent distress ABDOMEN: soft, nontender, no rebound or guarding, well healed midline scar GU = scrotum nontender/no erythema (chaperone present) NEURO: Pt is awake/alert, moves all extremitiesx4 EXTREMITIES: pulses normal, full ROM SKIN: warm, color normal, large fluctuant abscess to left buttocks which does not extend to perineum or rectum  PSYCH: no abnormalities of mood noted;   ED Course  Procedures   INCISION AND DRAINAGE PROCEDURE NOTE: Patient identification was confirmed and verbal consent was obtained. This procedure was performed by Joya Gaskins, MD at 8:30 AM. Site: left inferior buttocks Sterile procedures: observed Needle size: 25 gauge Anesthetic used (type and amt): 2% Lidocaine, 4ml Blade size: 11 Drainage: Moderate Packing used Abscess complex Site anesthetized, incision made over site, wound drained and explored loculations, rinsed with copious amounts of normal saline, wound packed with sterile gauze, covered with dry, sterile dressing.  Pt tolerated procedure well without complications.  Instructions for care discussed verbally and pt provided with additional written instructions for homecare and f/u.  DIAGNOSTIC STUDIES: Oxygen Saturation is 100% on room air, normal by my interpretation.    COORDINATION OF CARE: 7:48AM- Patient informed of current plan for treatment and evaluation and agrees with plan  at this time.  8:30AM- Joya Gaskins, MD to patient bedside to perform Incision and Drainage.    Pt stable for outpatient management.  Given exam, do not feel this is perirectal abscess, do not feel CT imaging warranted.  Will start doxycycline, pain meds and 48 hour followup.  Discussed strict return  precautions The small abscess on right buttock does not require drainage at this time   MDM  Nursing notes reviewed and considered in documentation All labs/vitals reviewed and considered      I personally performed the services described in this documentation, which was scribed in my presence. The recorded information has been reviewed and considered.      Joya Gaskins, MD 01/28/12 [phone]

## 2012-01-28 NOTE — Discharge Instructions (Signed)
Abscess An abscess (boil or furuncle) is an infected area that contains a collection of pus.  SYMPTOMS Signs and symptoms of an abscess include pain, tenderness, redness, or hardness. You may feel a moveable soft area under your skin. An abscess can occur anywhere in the body.  TREATMENT  A surgical cut (incision) may be made over your abscess to drain the pus. Gauze may be packed into the space or a drain may be looped through the abscess cavity (pocket). This provides a drain that will allow the cavity to heal from the inside outwards. The abscess may be painful for a few days, but should feel much better if it was drained.  Your abscess, if seen early, may not have localized and may not have been drained. If not, another appointment may be required if it does not get better on its own or with medications. HOME CARE INSTRUCTIONS   Only take over-the-counter or prescription medicines for pain, discomfort, or fever as directed by your caregiver.   Take your antibiotics as directed if they were prescribed. Finish them even if you start to feel better.   Keep the skin and clothes clean around your abscess.   If the abscess was drained, you will need to use gauze dressing to collect any draining pus. Dressings will typically need to be changed 3 or more times a day.   The infection may spread by skin contact with others. Avoid skin contact as much as possible.   Practice good hygiene. This includes regular hand washing, cover any draining skin lesions, and do not share personal care items.   If you participate in sports, do not share athletic equipment, towels, whirlpools, or personal care items. Shower after every practice or tournament.   If a draining area cannot be adequately covered:   Do not participate in sports.   Children should not participate in day care until the wound has healed or drainage stops.   If your caregiver has given you a follow-up appointment, it is very important  to keep that appointment. Not keeping the appointment could result in a much worse infection, chronic or permanent injury, pain, and disability. If there is any problem keeping the appointment, you must call back to this facility for assistance.  SEEK MEDICAL CARE IF:   You develop increased pain, swelling, redness, drainage, or bleeding in the wound site.   You develop signs of generalized infection including muscle aches, chills, fever, or a general ill feeling.   You have an oral temperature above 102 F (38.9 C).  MAKE SURE YOU:   Understand these instructions.   Will watch your condition.   Will get help right away if you are not doing well or get worse.  Document Released: 09/02/2005 Document Revised: 08/05/2011 Document Reviewed: 06/26/2008 [facility] Patient Information 2012 [place], [place].Abscess Care After An abscess (also called a boil or furuncle) is an infected area that contains a collection of pus. Signs and symptoms of an abscess include pain, tenderness, redness, or hardness, or you may feel a moveable soft area under your skin. An abscess can occur anywhere in the body. The infection may spread to surrounding tissues causing cellulitis. A cut (incision) by the surgeon was made over your abscess and the pus was drained out. Gauze may have been packed into the space to provide a drain that will allow the cavity to heal from the inside outwards. The boil may be painful for 5 to 7 days. Most people with a boil  do not have high fevers. Your abscess, if seen early, may not have localized, and may not have been lanced. If not, another appointment may be required for this if it does not get better on its own or with medications. HOME CARE INSTRUCTIONS   Only take over-the-counter or prescription medicines for pain, discomfort, or fever as directed by your caregiver.   When you bathe, soak and then remove gauze or iodoform packs at least daily or as directed by your caregiver. You may  then wash the wound gently with mild soapy water. Repack with gauze or do as your caregiver directs.  SEEK IMMEDIATE MEDICAL CARE IF:   You develop increased pain, swelling, redness, drainage, or bleeding in the wound site.   You develop signs of generalized infection including muscle aches, chills, fever, or a general ill feeling.   An oral temperature above 102 F (38.9 C) develops, not controlled by medication.  See your caregiver for a recheck if you develop any of the symptoms described above. If medications (antibiotics) were prescribed, take them as directed. Document Released: 06/11/2005 Document Revised: 08/05/2011 Document Reviewed: 02/06/2008 [facility] Patient Information 2012 [place], [place].

## 2012-01-28 NOTE — ED Notes (Signed)
Bulky dressing applied to wound. Pt notified not to take packing out and return on Saturday for recheck.

## 2012-01-28 NOTE — ED Notes (Signed)
Pt has a large swollen red warm to touch area on his left buttock and a medium size area on the left. Pt states that he had them both lanced 2 months ago and now it has come back. Pt alert and oriented x 3. Skin warm and dry. Color pink.

## 2014-07-26 ENCOUNTER — Emergency Department (HOSPITAL_COMMUNITY)
Admission: EM | Admit: 2014-07-26 | Discharge: 2014-07-26 | Disposition: A | Discharge status: Home / Self Care | Payer: Medicaid Other | Attending: Emergency Medicine | Admitting: Emergency Medicine

## 2014-07-26 ENCOUNTER — Emergency Department (HOSPITAL_COMMUNITY): Payer: Medicaid Other

## 2014-07-26 ENCOUNTER — Encounter (HOSPITAL_COMMUNITY): Payer: Self-pay | Admitting: Emergency Medicine

## 2014-07-26 DIAGNOSIS — Z872 Personal history of diseases of the skin and subcutaneous tissue: Secondary | ICD-10-CM | POA: Diagnosis not present

## 2014-07-26 DIAGNOSIS — Z9889 Other specified postprocedural states: Secondary | ICD-10-CM | POA: Insufficient documentation

## 2014-07-26 DIAGNOSIS — Z791 Long term (current) use of non-steroidal anti-inflammatories (NSAID): Secondary | ICD-10-CM | POA: Insufficient documentation

## 2014-07-26 DIAGNOSIS — Z79899 Other long term (current) drug therapy: Secondary | ICD-10-CM | POA: Insufficient documentation

## 2014-07-26 DIAGNOSIS — Z880 Allergy status to penicillin: Secondary | ICD-10-CM | POA: Diagnosis not present

## 2014-07-26 DIAGNOSIS — M545 Low back pain, unspecified: Secondary | ICD-10-CM | POA: Insufficient documentation

## 2014-07-26 DIAGNOSIS — R109 Unspecified abdominal pain: Secondary | ICD-10-CM | POA: Insufficient documentation

## 2014-07-26 DIAGNOSIS — Z87891 Personal history of nicotine dependence: Secondary | ICD-10-CM | POA: Insufficient documentation

## 2014-07-26 DIAGNOSIS — N509 Disorder of male genital organs, unspecified: Secondary | ICD-10-CM | POA: Insufficient documentation

## 2014-07-26 DIAGNOSIS — R1031 Right lower quadrant pain: Secondary | ICD-10-CM

## 2014-07-26 LAB — URINALYSIS, ROUTINE W REFLEX MICROSCOPIC
BILIRUBIN URINE: NEGATIVE
GLUCOSE, UA: NEGATIVE mg/dL
HGB URINE DIPSTICK: NEGATIVE
Ketones, ur: NEGATIVE mg/dL
Leukocytes, UA: NEGATIVE
Nitrite: NEGATIVE
Protein, ur: NEGATIVE mg/dL
UROBILINOGEN UA: 0.2 mg/dL (ref 0.0–1.0)
pH: 6.5 (ref 5.0–8.0)

## 2014-07-26 MED ORDER — FAMOTIDINE 20 MG PO TABS: 20.0000 mg | ORAL_TABLET | Freq: Two times a day (BID) | ORAL | Status: DC

## 2014-07-26 MED ORDER — NAPROXEN 500 MG PO TABS: 500.0000 mg | ORAL_TABLET | Freq: Two times a day (BID) | ORAL | Status: DC

## 2014-07-26 MED ORDER — OXYCODONE-ACETAMINOPHEN 5-325 MG PO TABS
2.0000 | ORAL_TABLET | Freq: Once | ORAL | Status: AC
  Administered 2014-07-26: 2 via ORAL
  Filled 2014-07-26: qty 2

## 2014-07-26 MED ORDER — OXYCODONE-ACETAMINOPHEN 5-325 MG PO TABS: 1.0000 | ORAL_TABLET | ORAL | Status: DC | PRN

## 2014-07-26 NOTE — Care Management Note (Signed)
ED/CM noted patient did not have health insurance and/or PCP listed in the computer.  Patient was given the [facility] with information on the clinics, food pantries, and the handout for new health insurance sign-up.  Patient expressed appreciation for information received. Pt was also given a Rx discount card.

## 2014-07-26 NOTE — ED Notes (Signed)
Instructed pt to get into gown for exam

## 2014-07-26 NOTE — ED Notes (Signed)
Pt reports right sided groin pain and swelling. Pt denies any known injury.Pt denies any urinary symptoms. nad noted.

## 2014-07-26 NOTE — ED Notes (Signed)
Pt with right groin and right testicle pain since Tuesday, denies any injury to area, no redness or swelling noted

## 2014-07-28 NOTE — ED Provider Notes (Addendum)
CSN: [id number]     Arrival date & time 07/26/14  1248 History   First MD Initiated Contact with Patient 07/26/14 1403     Chief Complaint  Patient presents with  . Groin Pain   MOUSTAFA MOSSA is a 43 y.o. male who presents to the Emergency Department complaining of pain to his right groin and testicle.  He states the pain has been present for two days.  He states the pain is worse with movement of his right leg and with palpation to the affected area and improves at rest.  He denies known injury, but states that he does a lot of walking and bending.  He denies pain, burning with urination, fever, hematuria, back or abdominal pain, penile pain or discharge.  Patient also denies h/o hernias.  (Consider location/radiation/quality/duration/timing/severity/associated sxs/prior Treatment) HPI  Past Medical History  Diagnosis Date  . Abscess    Past Surgical History  Procedure Laterality Date  . Abdominal surgery     History reviewed. No pertinent family history. History  Substance Use Topics  . Smoking status: Former Research scientist (life sciences)  . Smokeless tobacco: Current User  . Alcohol Use: Yes     Comment: occasional    Review of Systems  Constitutional: Negative for fever, chills and appetite change.  Respiratory: Negative for chest tightness and shortness of breath.   Cardiovascular: Negative for chest pain.  Gastrointestinal: Negative for nausea, vomiting, abdominal pain, diarrhea, constipation and anal bleeding.  Genitourinary: Positive for testicular pain. Negative for dysuria, urgency, hematuria, flank pain, decreased urine volume, discharge, scrotal swelling, difficulty urinating and penile pain.  Musculoskeletal: Positive for myalgias. Negative for arthralgias, back pain, joint swelling and neck pain.  Skin: Negative for color change, rash and wound.  Neurological: Negative for weakness and numbness.  Hematological: Negative for adenopathy.  All other systems reviewed and are  negative.     Allergies  Aspirin and Penicillins  Home Medications   Prior to Admission medications   Medication Sig Start Date End Date Taking? Authorizing Provider  acetaminophen (TYLENOL) 325 MG tablet Take 650 mg by mouth every 6 (six) hours as needed. pain    Yes Historical Provider, MD  famotidine (PEPCID) 20 MG tablet Take 1 tablet (20 mg total) by mouth 2 (two) times daily. 07/26/14   Mylei Brackeen L. Angellee Cohill, PA-C  naproxen (NAPROSYN) 500 MG tablet Take 1 tablet (500 mg total) by mouth 2 (two) times daily. Take with food 07/26/14   Ranen Doolin L. Latrell Potempa, PA-C  oxyCODONE-acetaminophen (PERCOCET/ROXICET) 5-325 MG per tablet Take 1 tablet by mouth every 4 (four) hours as needed. 07/26/14   Verdis Bassette L. Makaiah Terwilliger, PA-C   BP 140/86  Pulse 81  Temp(Src) 98.5 F (36.9 C) (Oral)  Resp 18  Ht 5\' 8"  (1.727 m)  Wt 145 lb (65.772 kg)  BMI 22.05 kg/m2  SpO2 99% Physical Exam  Nursing note and vitals reviewed. Constitutional: He is oriented to person, place, and time. He appears well-developed and well-nourished. No distress.  HENT:  Head: Normocephalic and atraumatic.  Mouth/Throat: Oropharynx is clear and moist.  Cardiovascular: Normal rate, regular rhythm, normal heart sounds and intact distal pulses.   No murmur heard. Pulmonary/Chest: Effort normal and breath sounds normal. No respiratory distress. He exhibits no tenderness.  Abdominal: Soft. He exhibits no distension and no mass. There is no tenderness. There is no rebound and no guarding. Hernia confirmed negative in the right inguinal area.  Genitourinary: Penis normal. Cremasteric reflex is present. Right testis shows tenderness. Right testis  shows no mass and no swelling. Right testis is descended. Cremasteric reflex is not absent on the right side. Circumcised. No phimosis, paraphimosis, penile erythema or penile tenderness.  Musculoskeletal: He exhibits tenderness. He exhibits no edema.       Lumbar back: He exhibits tenderness and pain. He  exhibits normal range of motion, no swelling, no deformity, no laceration and normal pulse.  ttp of the right groin.  Pain reproduced with right hip rotation and flexion.  DP pulses are brisk and symmetrical.  Distal sensation intact.    Pt has 5/5 strength against resistance of bilateral lower extremities.     Lymphadenopathy:       Right: No inguinal adenopathy present.  Neurological: He is alert and oriented to person, place, and time. He has normal strength. No sensory deficit. He exhibits normal muscle tone. Coordination and gait normal.  Skin: Skin is warm and dry. No rash noted.    ED Course  Procedures (including critical care time) Labs Review Labs Reviewed  URINALYSIS, ROUTINE W REFLEX MICROSCOPIC - Abnormal; Notable for the following:    Specific Gravity, Urine <1.005 (*)    All other components within normal limits    Imaging Review US Scrotum  07/26/2014   CLINICAL DATA:  Right hip and groin pain  EXAM: SCROTAL ULTRASOUND  DOPPLER ULTRASOUND OF THE TESTICLES  TECHNIQUE: Complete ultrasound examination of the testicles, epididymis, and other scrotal structures was performed. Color and spectral Doppler ultrasound were also utilized to evaluate blood flow to the testicles.  COMPARISON:  None.  FINDINGS: Right testicle  Measurements: 4.2 x 2.0 x 3.5 cm. No mass or microlithiasis visualized.  Left testicle  Measurements: 4.2 x 2.3 x 3.4 cm. No mass or microlithiasis visualized.  Right epididymis: Normal in size and appearance. Incidental 5 mm minimally complex epididymal cyst.  Left epididymis:  Normal in size and appearance.  Hydrocele:  None visualized.  Varicocele:  None visualized.  Pulsed Doppler interrogation of both testes demonstrates low resistance arterial and venous waveforms bilaterally.  IMPRESSION: No acute finding or significant abnormality by ultrasound. No evidence of torsion.   Electronically Signed   By: Daryll Brod M.D.   On: 07/26/2014 15:59   [place] Art/ven Flow Abd  Pelv Doppler  07/26/2014   CLINICAL DATA:  Right hip and groin pain  EXAM: SCROTAL ULTRASOUND  DOPPLER ULTRASOUND OF THE TESTICLES  TECHNIQUE: Complete ultrasound examination of the testicles, epididymis, and other scrotal structures was performed. Color and spectral Doppler ultrasound were also utilized to evaluate blood flow to the testicles.  COMPARISON:  None.  FINDINGS: Right testicle  Measurements: 4.2 x 2.0 x 3.5 cm. No mass or microlithiasis visualized.  Left testicle  Measurements: 4.2 x 2.3 x 3.4 cm. No mass or microlithiasis visualized.  Right epididymis: Normal in size and appearance. Incidental 5 mm minimally complex epididymal cyst.  Left epididymis:  Normal in size and appearance.  Hydrocele:  None visualized.  Varicocele:  None visualized.  Pulsed Doppler interrogation of both testes demonstrates low resistance arterial and venous waveforms bilaterally.  IMPRESSION: No acute finding or significant abnormality by ultrasound. No evidence of torsion.   Electronically Signed   By: Daryll Brod M.D.   On: 07/26/2014 15:59     EKG Interpretation None      MDM   Final diagnoses:  Groin pain, right    [place] results reviewed and discussed.  Sx's likely related to muscular/ligament strain.  Pt agrees to symptomatic tx with naprosyn, ice  and percocet.  pepcid also prescribed to prevent stomach upset for naprosyn.  Pt agrees to PMD f/u or return here if any worsening sx's    Christy Ehrsam L. Dellis Voght, PA-C 07/28/14 1356  Railynn Ballo L. Emori Kamau, PA-C 08/09/14 0901

## 2014-07-30 NOTE — ED Provider Notes (Signed)
Medical screening examination/treatment/procedure(s) were performed by non-physician practitioner and as supervising physician I was immediately available for consultation/collaboration.   EKG Interpretation None        Maudry Diego, MD 07/30/14 [phone]

## 2014-08-09 NOTE — ED Provider Notes (Signed)
Medical screening examination/treatment/procedure(s) were performed by non-physician practitioner and as supervising physician I was immediately available for consultation/collaboration.   EKG Interpretation None        Maudry Diego, MD 08/09/14 [phone]

## 2014-10-21 IMAGING — US US SCROTUM
1 series · 14 of 25 positions shown · non-contrast
Comparison: None.

CLINICAL DATA: Right hip and groin pain

EXAM:
SCROTAL ULTRASOUND
DOPPLER ULTRASOUND OF THE TESTICLES
TECHNIQUE: Complete ultrasound examination of the testicles, epididymis, and
other scrotal structures was performed. Color and spectral Doppler
ultrasound were also utilized to evaluate blood flow to the
testicles.

[Series 1: us scrotum · 0.05mm/px · 14 of 65 slices shown]
[im 1/65]
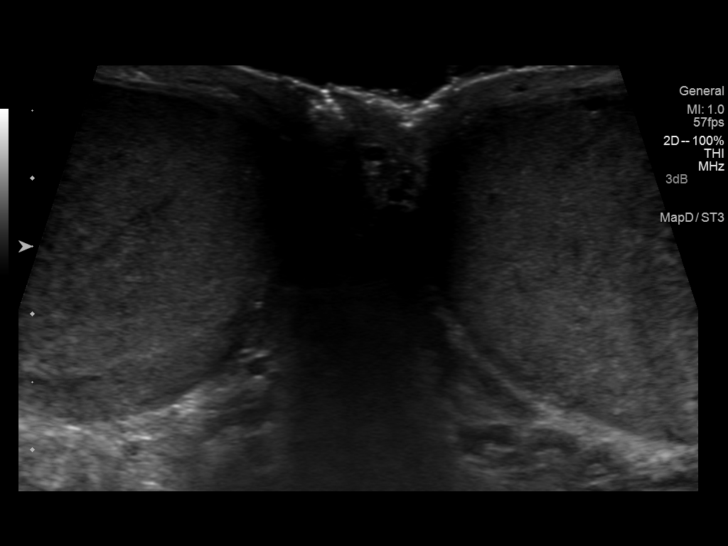
[im 6/65]
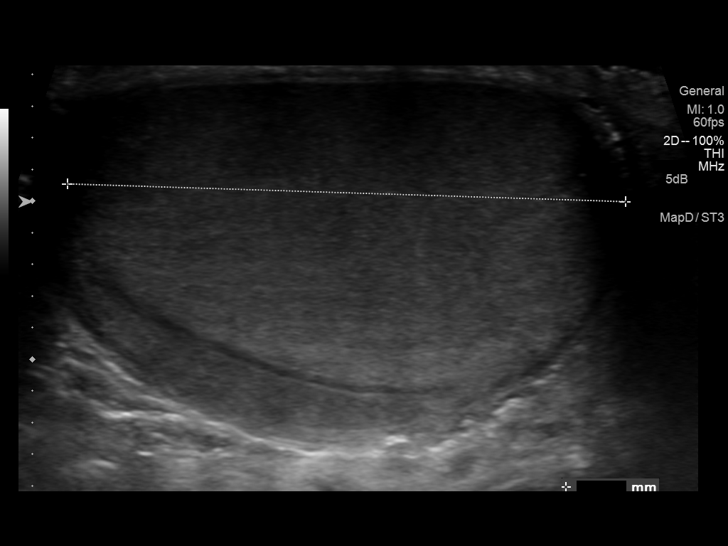
[im 11/65]
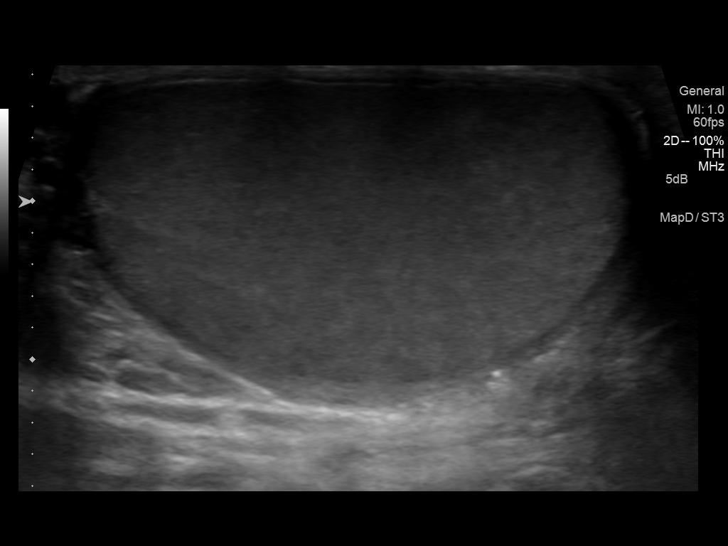
[im 17/65]
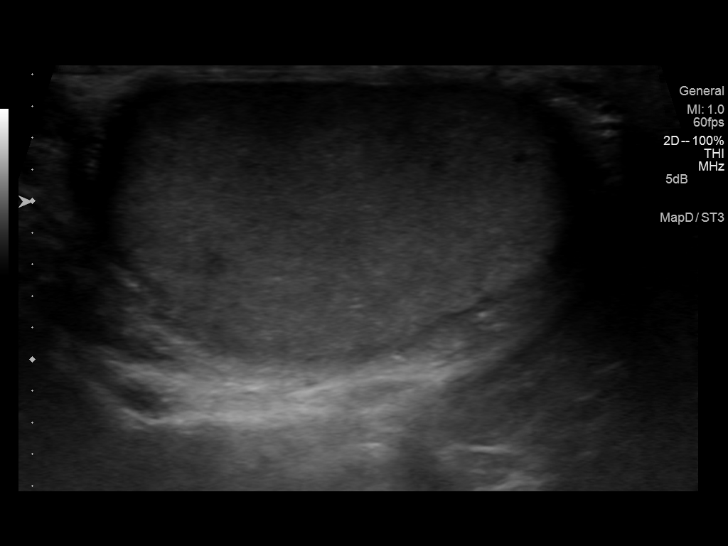
[im 22/65]
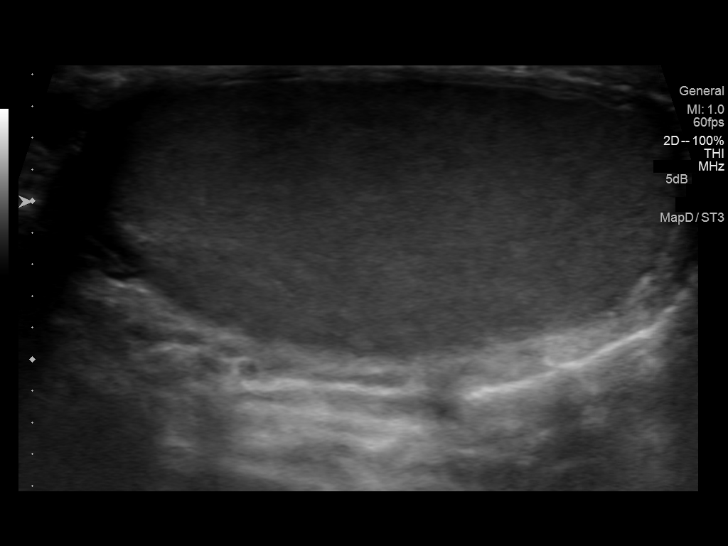
[im 25/65]
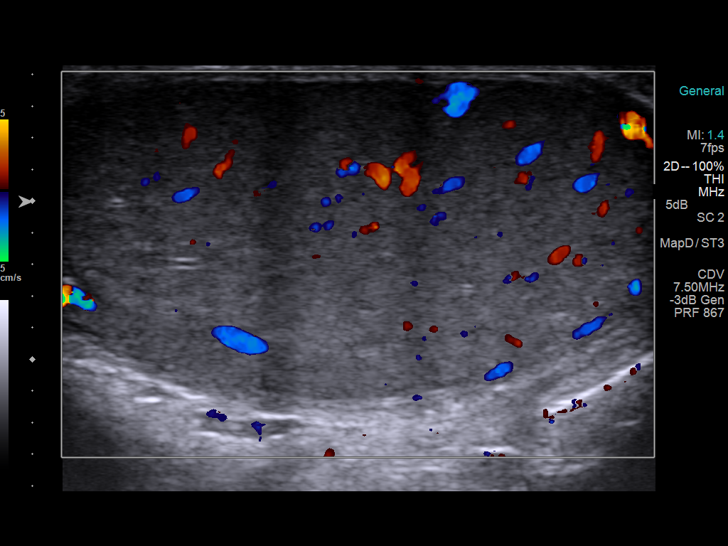
[im 30/65]
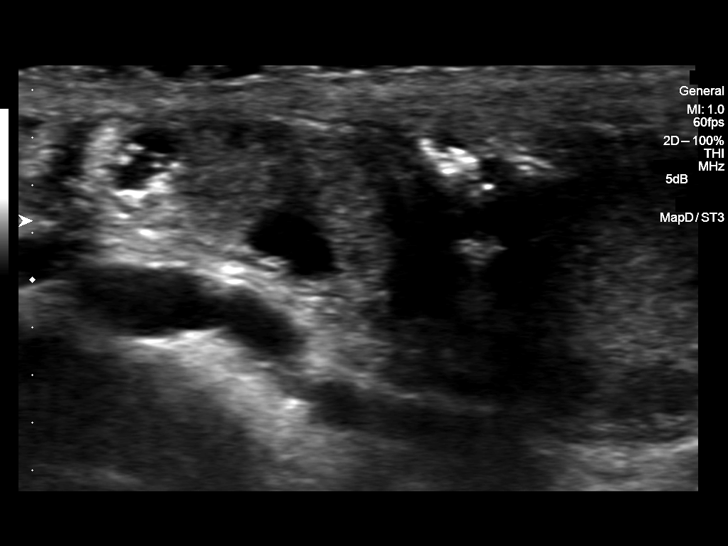
[im 35/65]
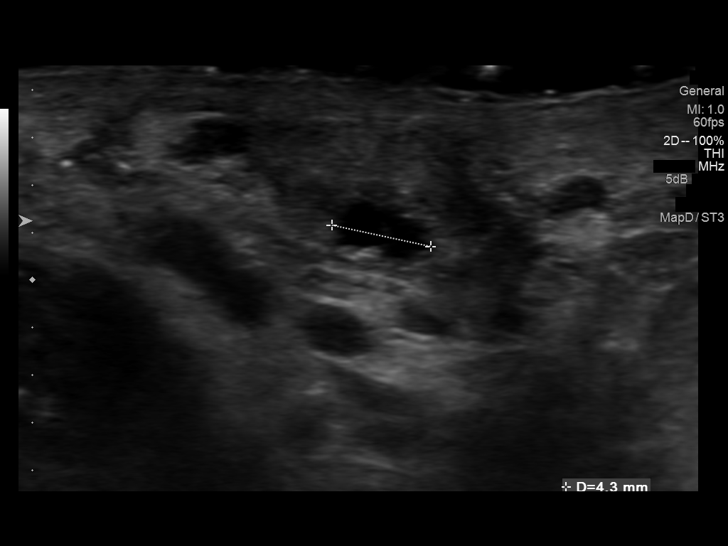
[im 41/65]
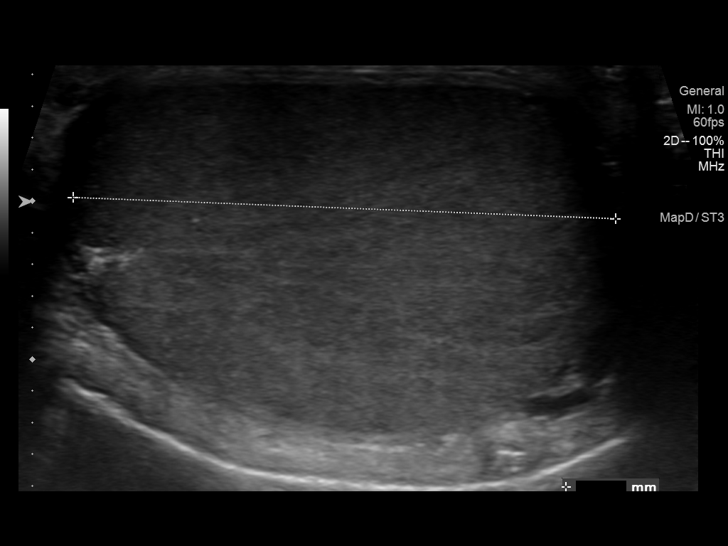
[im 43/65]
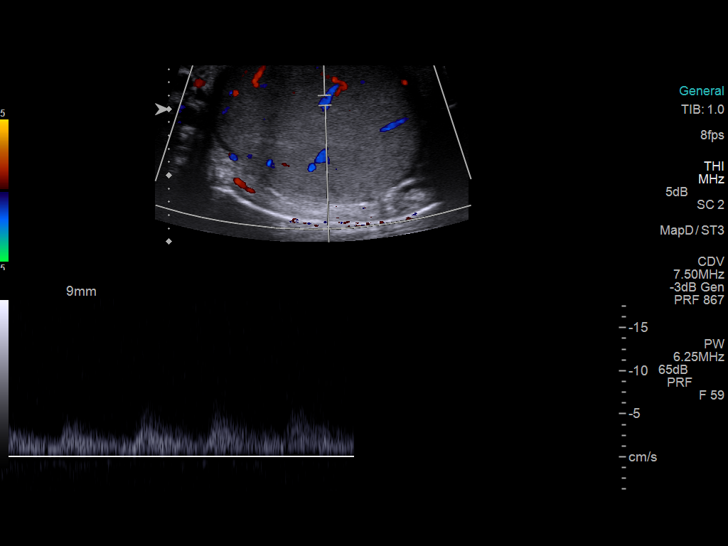
[im 49/65]
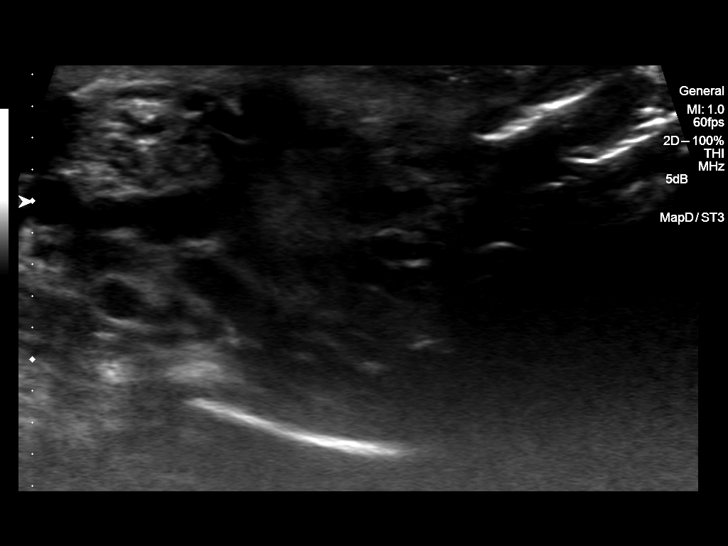
[im 54/65]
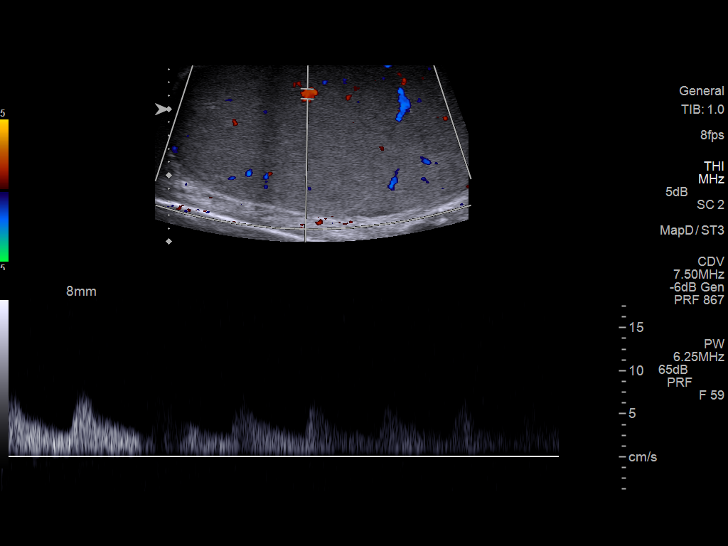
[im 59/65]
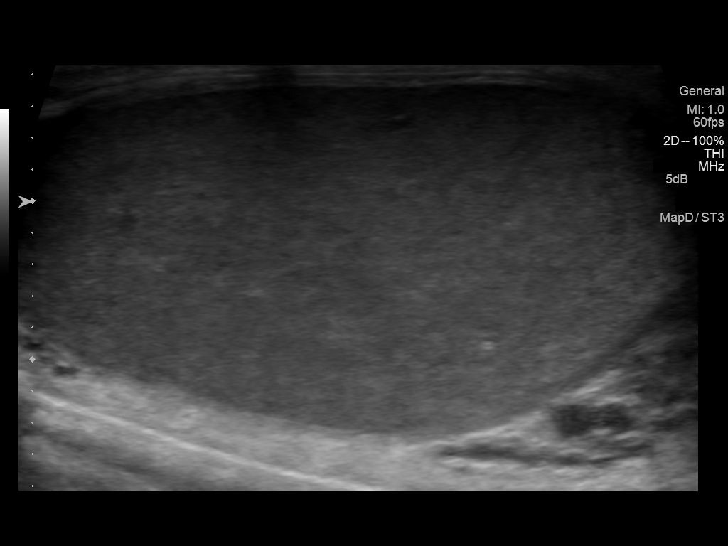
[im 65/65]
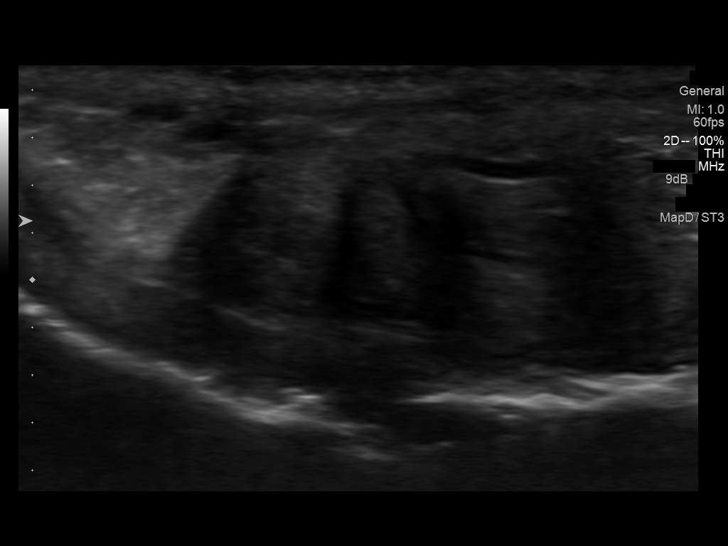

[14 of 25 positions shown; findings below may reference images not displayed]

FINDINGS: Right testicle

Measurements: 4.2 x 2.0 x 3.5 cm. No mass or microlithiasis
visualized.

Left testicle

Measurements: 4.2 x 2.3 x 3.4 cm. No mass or microlithiasis
visualized.

Right epididymis: Normal in size and appearance. Incidental 5 mm
minimally complex epididymal cyst.

Left epididymis:  Normal in size and appearance.

Hydrocele:  None visualized.

Varicocele:  None visualized.

Pulsed Doppler interrogation of both testes demonstrates low
resistance arterial and venous waveforms bilaterally.
IMPRESSION: No acute finding or significant abnormality by ultrasound. No
evidence of torsion.

## 2016-06-11 ENCOUNTER — Emergency Department (HOSPITAL_COMMUNITY)
Admission: EM | Admit: 2016-06-11 | Discharge: 2016-06-11 | Disposition: A | Discharge status: Home / Self Care | Payer: Medicaid Other | Attending: Emergency Medicine | Admitting: Emergency Medicine

## 2016-06-11 ENCOUNTER — Emergency Department (HOSPITAL_COMMUNITY): Payer: Medicaid Other

## 2016-06-11 ENCOUNTER — Encounter (HOSPITAL_COMMUNITY): Payer: Self-pay | Admitting: Emergency Medicine

## 2016-06-11 DIAGNOSIS — Z79899 Other long term (current) drug therapy: Secondary | ICD-10-CM | POA: Diagnosis not present

## 2016-06-11 DIAGNOSIS — R350 Frequency of micturition: Secondary | ICD-10-CM | POA: Diagnosis not present

## 2016-06-11 DIAGNOSIS — Z87891 Personal history of nicotine dependence: Secondary | ICD-10-CM | POA: Insufficient documentation

## 2016-06-11 DIAGNOSIS — Z8719 Personal history of other diseases of the digestive system: Secondary | ICD-10-CM | POA: Insufficient documentation

## 2016-06-11 DIAGNOSIS — R300 Dysuria: Secondary | ICD-10-CM | POA: Diagnosis not present

## 2016-06-11 DIAGNOSIS — N3289 Other specified disorders of bladder: Secondary | ICD-10-CM | POA: Diagnosis not present

## 2016-06-11 LAB — COMPREHENSIVE METABOLIC PANEL
ALBUMIN: 4.2 g/dL (ref 3.5–5.0)
ALT: 14 U/L — ABNORMAL LOW (ref 17–63)
ANION GAP: 6 (ref 5–15)
AST: 16 U/L (ref 15–41)
Alkaline Phosphatase: 74 U/L (ref 38–126)
BILIRUBIN TOTAL: 0.4 mg/dL (ref 0.3–1.2)
BUN: 7 mg/dL (ref 6–20)
CHLORIDE: 103 mmol/L (ref 101–111)
CO2: 29 mmol/L (ref 22–32)
Calcium: 9.4 mg/dL (ref 8.9–10.3)
Creatinine, Ser: 0.88 mg/dL (ref 0.61–1.24)
GFR calc Af Amer: >60 mL/min (ref >60)
GFR calc non Af Amer: >60 mL/min (ref >60)
GLUCOSE: 97 mg/dL (ref 65–99)
POTASSIUM: 3.8 mmol/L (ref 3.5–5.1)
SODIUM: 138 mmol/L (ref 135–145)
TOTAL PROTEIN: 6.9 g/dL (ref 6.5–8.1)

## 2016-06-11 LAB — URINALYSIS, ROUTINE W REFLEX MICROSCOPIC
BILIRUBIN URINE: NEGATIVE
GLUCOSE, UA: NEGATIVE mg/dL
Hgb urine dipstick: NEGATIVE
KETONES UR: NEGATIVE mg/dL
LEUKOCYTES UA: NEGATIVE
Nitrite: NEGATIVE
PH: 6 (ref 5.0–8.0)
PROTEIN: NEGATIVE mg/dL
Specific Gravity, Urine: <1.005 — ABNORMAL LOW (ref 1.005–1.030)

## 2016-06-11 LAB — CBC WITH DIFFERENTIAL/PLATELET
BASOS ABS: 0.1 10*3/uL (ref 0.0–0.1)
BASOS PCT: 1 %
EOS ABS: 0.2 10*3/uL (ref 0.0–0.7)
EOS PCT: 2 %
HCT: 42.7 % (ref 39.0–52.0)
Hemoglobin: 13.9 g/dL (ref 13.0–17.0)
Lymphocytes Relative: 28 %
Lymphs Abs: 2.7 10*3/uL (ref 0.7–4.0)
MCH: 28.4 pg (ref 26.0–34.0)
MCHC: 32.6 g/dL (ref 30.0–36.0)
MCV: 87.1 fL (ref 78.0–100.0)
MONO ABS: 1.1 10*3/uL — AB (ref 0.1–1.0)
MONOS PCT: 11 %
NEUTROS ABS: 5.6 10*3/uL (ref 1.7–7.7)
Neutrophils Relative %: 58 %
PLATELETS: 206 10*3/uL (ref 150–400)
RBC: 4.9 MIL/uL (ref 4.22–5.81)
RDW: 13 % (ref 11.5–15.5)
WBC: 9.6 10*3/uL (ref 4.0–10.5)

## 2016-06-11 MED ORDER — PHENAZOPYRIDINE HCL 200 MG PO TABS: 200.0000 mg | ORAL_TABLET | Freq: Three times a day (TID) | ORAL | Status: DC

## 2016-06-11 MED ORDER — IOPAMIDOL (ISOVUE-300) INJECTION 61%
100.0000 mL | Freq: Once | INTRAVENOUS | Status: AC | PRN
  Administered 2016-06-11: 100 mL via INTRAVENOUS

## 2016-06-11 MED ORDER — CEPHALEXIN 500 MG PO CAPS: 500.0000 mg | ORAL_CAPSULE | Freq: Four times a day (QID) | ORAL | Status: DC

## 2016-06-11 NOTE — ED Notes (Signed)
Bladder scan=77ml.

## 2016-06-11 NOTE — Discharge Instructions (Signed)
Dysuria Take the antibiotics as prescribed and follow up with the urologist. Return to the ED if you develop new or worsening symptoms. Dysuria is pain or discomfort while urinating. The pain or discomfort may be felt in the tube that carries urine out of the bladder (urethra) or in the surrounding tissue of the genitals. The pain may also be felt in the groin area, lower abdomen, and lower back. You may have to urinate frequently or have the sudden feeling that you have to urinate (urgency). Dysuria can affect both men and women, but is more common in women. Dysuria can be caused by many different things, including:  Urinary tract infection in women.  Infection of the kidney or bladder.  Kidney stones or bladder stones.  Certain sexually transmitted infections (STIs), such as chlamydia.  Dehydration.  Inflammation of the vagina.  Use of certain medicines.  Use of certain soaps or scented products that cause irritation. HOME CARE INSTRUCTIONS Watch your dysuria for any changes. The following actions may help to reduce any discomfort you are feeling:  Drink enough fluid to keep your urine clear or pale yellow.  Empty your bladder often. Avoid holding urine for long periods of time.  After a bowel movement or urination, women should cleanse from front to back, using each tissue only once.  Empty your bladder after sexual intercourse.  Take medicines only as directed by your health care provider.  If you were prescribed an antibiotic medicine, finish it all even if you start to feel better.  Avoid caffeine, tea, and alcohol. They can irritate the bladder and make dysuria worse. In men, alcohol may irritate the prostate.  Keep all follow-up visits as directed by your health care provider. This is important.  If you had any tests done to find the cause of dysuria, it is your responsibility to obtain your test results. Ask the lab or department performing the test when and how you  will get your results. Talk with your health care provider if you have any questions about your results. SEEK MEDICAL CARE IF:  You develop pain in your back or sides.  You have a fever.  You have nausea or vomiting.  You have blood in your urine.  You are not urinating as often as you usually do. SEEK IMMEDIATE MEDICAL CARE IF:  You pain is severe and not relieved with medicines.  You are unable to hold down any fluids.  You or someone else notices a change in your mental function.  You have a rapid heartbeat at rest.  You have shaking or chills.  You feel extremely weak.   This information is not intended to replace advice given to you by your health care provider. Make sure you discuss any questions you have with your health care provider.   Document Released: 08/21/2004 Document Revised: 12/14/2014 Document Reviewed: 07/19/2014 Elsevier Interactive Patient Education [employer].

## 2016-06-11 NOTE — ED Notes (Signed)
Having pain when voiding, freguency
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
## 2016-06-12 NOTE — ED Provider Notes (Signed)
Received call from the pharmacy the patient is refusing his Keflex. He thinks that his parents have an allergy so he he may have an allergy as well and refuses to take it. After review of his visit appears to be being treated for cystitis so I will start him on ciprofloxacin 500 mg twice a day for 7 days.  Merrily Pew, MD 06/12/16 [phone]

## 2016-06-13 LAB — URINE CULTURE: CULTURE: NO GROWTH

## 2016-11-25 ENCOUNTER — Emergency Department (HOSPITAL_COMMUNITY)
Admission: EM | Admit: 2016-11-25 | Discharge: 2016-11-25 | Disposition: A | Discharge status: Home / Self Care | Payer: Medicaid Other | Attending: Emergency Medicine | Admitting: Emergency Medicine

## 2016-11-25 ENCOUNTER — Encounter (HOSPITAL_COMMUNITY): Payer: Self-pay | Admitting: Emergency Medicine

## 2016-11-25 DIAGNOSIS — R300 Dysuria: Secondary | ICD-10-CM | POA: Insufficient documentation

## 2016-11-25 DIAGNOSIS — F17220 Nicotine dependence, chewing tobacco, uncomplicated: Secondary | ICD-10-CM | POA: Diagnosis not present

## 2016-11-25 DIAGNOSIS — Z139 Encounter for screening, unspecified: Secondary | ICD-10-CM

## 2016-11-25 LAB — URINALYSIS, ROUTINE W REFLEX MICROSCOPIC
BILIRUBIN URINE: NEGATIVE
GLUCOSE, UA: 100 mg/dL — AB
HGB URINE DIPSTICK: NEGATIVE
KETONES UR: NEGATIVE mg/dL
Leukocytes, UA: NEGATIVE
NITRITE: NEGATIVE
PH: 5.5 (ref 5.0–8.0)
Protein, ur: NEGATIVE mg/dL
Specific Gravity, Urine: <1.005 — ABNORMAL LOW (ref 1.005–1.030)

## 2016-11-25 NOTE — ED Triage Notes (Signed)
Pt states his wife has a UTI and is refusing to have sex unless he is checked to make sure he doesn't have a UTI. Wife c/o burning and itching.

## 2016-11-25 NOTE — Discharge Instructions (Signed)
Your urine test does not show any infection at this time.  You will be contacted by someone here at the hospital if any of your other tests are positive.

## 2016-11-26 LAB — GC/CHLAMYDIA PROBE AMP (~~LOC~~) NOT AT ARMC
CHLAMYDIA, DNA PROBE: NEGATIVE
NEISSERIA GONORRHEA: NEGATIVE
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
## 2016-11-27 LAB — URINE CULTURE: CULTURE: NO GROWTH

## 2017-08-31 ENCOUNTER — Ambulatory Visit (INDEPENDENT_AMBULATORY_CARE_PROVIDER_SITE_OTHER): Payer: Medicaid Other | Admitting: Urology

## 2017-08-31 DIAGNOSIS — R3912 Poor urinary stream: Secondary | ICD-10-CM | POA: Diagnosis not present

## 2017-08-31 DIAGNOSIS — N360 Urethral fistula: Secondary | ICD-10-CM

## 2017-09-28 ENCOUNTER — Ambulatory Visit (INDEPENDENT_AMBULATORY_CARE_PROVIDER_SITE_OTHER): Payer: Medicaid Other | Admitting: Urology

## 2017-09-28 DIAGNOSIS — N360 Urethral fistula: Secondary | ICD-10-CM

## 2017-09-28 DIAGNOSIS — N5201 Erectile dysfunction due to arterial insufficiency: Secondary | ICD-10-CM

## 2017-09-28 DIAGNOSIS — R3912 Poor urinary stream: Secondary | ICD-10-CM | POA: Diagnosis not present

## 2018-09-27 ENCOUNTER — Ambulatory Visit: Payer: Medicaid Other | Admitting: Urology

## 2018-09-27 DIAGNOSIS — N5201 Erectile dysfunction due to arterial insufficiency: Secondary | ICD-10-CM

## 2018-09-27 DIAGNOSIS — R3912 Poor urinary stream: Secondary | ICD-10-CM

## 2018-12-30 ENCOUNTER — Encounter (HOSPITAL_COMMUNITY): Payer: Self-pay | Admitting: *Deleted

## 2018-12-30 ENCOUNTER — Emergency Department (HOSPITAL_COMMUNITY)
Admission: EM | Admit: 2018-12-30 | Discharge: 2018-12-31 | Disposition: A | Discharge status: Home / Self Care | Payer: Medicaid Other | Attending: Emergency Medicine | Admitting: Emergency Medicine

## 2018-12-30 ENCOUNTER — Emergency Department (HOSPITAL_COMMUNITY): Payer: Medicaid Other

## 2018-12-30 DIAGNOSIS — Z79899 Other long term (current) drug therapy: Secondary | ICD-10-CM | POA: Insufficient documentation

## 2018-12-30 DIAGNOSIS — Z87891 Personal history of nicotine dependence: Secondary | ICD-10-CM | POA: Insufficient documentation

## 2018-12-30 DIAGNOSIS — R0789 Other chest pain: Secondary | ICD-10-CM | POA: Diagnosis not present

## 2018-12-30 LAB — CBC WITH DIFFERENTIAL/PLATELET
Abs Immature Granulocytes: 0.03 10*3/uL (ref 0.00–0.07)
BASOS ABS: 0.1 10*3/uL (ref 0.0–0.1)
Basophils Relative: 1 %
Eosinophils Absolute: 0.2 10*3/uL (ref 0.0–0.5)
Eosinophils Relative: 2 %
HEMATOCRIT: 44.8 % (ref 39.0–52.0)
HEMOGLOBIN: 13.8 g/dL (ref 13.0–17.0)
IMMATURE GRANULOCYTES: 0 %
LYMPHS ABS: 2 10*3/uL (ref 0.7–4.0)
Lymphocytes Relative: 19 %
MCH: 27.7 pg (ref 26.0–34.0)
MCHC: 30.8 g/dL (ref 30.0–36.0)
MCV: 89.8 fL (ref 80.0–100.0)
Monocytes Absolute: 1 10*3/uL (ref 0.1–1.0)
Monocytes Relative: 10 %
NEUTROS PCT: 68 %
NRBC: 0 % (ref 0.0–0.2)
Neutro Abs: 7.4 10*3/uL (ref 1.7–7.7)
Platelets: 248 10*3/uL (ref 150–400)
RBC: 4.99 MIL/uL (ref 4.22–5.81)
RDW: 13 % (ref 11.5–15.5)
WBC: 10.8 10*3/uL — AB (ref 4.0–10.5)

## 2018-12-30 LAB — COMPREHENSIVE METABOLIC PANEL
ALBUMIN: 3.9 g/dL (ref 3.5–5.0)
ALT: 15 U/L (ref 0–44)
ANION GAP: 8 (ref 5–15)
AST: 15 U/L (ref 15–41)
Alkaline Phosphatase: 75 U/L (ref 38–126)
BILIRUBIN TOTAL: 0.9 mg/dL (ref 0.3–1.2)
BUN: 11 mg/dL (ref 6–20)
CO2: 27 mmol/L (ref 22–32)
Calcium: 9.3 mg/dL (ref 8.9–10.3)
Chloride: 103 mmol/L (ref 98–111)
Creatinine, Ser: 0.98 mg/dL (ref 0.61–1.24)
GFR calc non Af Amer: >60 mL/min (ref >60)
GLUCOSE: 106 mg/dL — AB (ref 70–99)
POTASSIUM: 4.8 mmol/L (ref 3.5–5.1)
Sodium: 138 mmol/L (ref 135–145)
TOTAL PROTEIN: 6.8 g/dL (ref 6.5–8.1)

## 2018-12-30 LAB — D-DIMER, QUANTITATIVE: D-Dimer, Quant: <0.27 ug/mL-FEU (ref 0.00–0.50)

## 2018-12-30 LAB — TROPONIN I

## 2018-12-30 LAB — LIPASE, BLOOD: LIPASE: 29 U/L (ref 11–51)

## 2018-12-30 MED ORDER — SODIUM CHLORIDE 0.9% FLUSH: 3.0000 mL | Freq: Once | INTRAVENOUS | Status: DC

## 2018-12-30 MED ORDER — OXYCODONE-ACETAMINOPHEN 5-325 MG PO TABS
2.0000 | ORAL_TABLET | Freq: Once | ORAL | Status: AC
  Administered 2018-12-30: 2 via ORAL
  Filled 2018-12-30: qty 2
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
## 2018-12-30 NOTE — ED Triage Notes (Signed)
Pt with right sided cp since eating at 1800 with sob.

## 2018-12-31 LAB — TROPONIN I

## 2019-01-16 ENCOUNTER — Emergency Department (HOSPITAL_COMMUNITY)
Admission: EM | Admit: 2019-01-16 | Discharge: 2019-01-16 | Disposition: A | Discharge status: Home / Self Care | Payer: Medicaid Other | Attending: Emergency Medicine | Admitting: Emergency Medicine

## 2019-01-16 ENCOUNTER — Encounter (HOSPITAL_COMMUNITY): Payer: Self-pay | Admitting: *Deleted

## 2019-01-16 ENCOUNTER — Emergency Department (HOSPITAL_COMMUNITY)
Admission: EM | Admit: 2019-01-16 | Discharge: 2019-01-17 | Disposition: A | Discharge status: Home / Self Care | Payer: Medicaid Other | Source: Home / Self Care | Attending: Emergency Medicine | Admitting: Emergency Medicine

## 2019-01-16 ENCOUNTER — Emergency Department (HOSPITAL_COMMUNITY): Payer: Medicaid Other

## 2019-01-16 ENCOUNTER — Encounter (HOSPITAL_COMMUNITY): Payer: Self-pay | Admitting: Emergency Medicine

## 2019-01-16 ENCOUNTER — Other Ambulatory Visit: Payer: Self-pay

## 2019-01-16 DIAGNOSIS — F17220 Nicotine dependence, chewing tobacco, uncomplicated: Secondary | ICD-10-CM | POA: Insufficient documentation

## 2019-01-16 DIAGNOSIS — R1011 Right upper quadrant pain: Secondary | ICD-10-CM

## 2019-01-16 DIAGNOSIS — Z79899 Other long term (current) drug therapy: Secondary | ICD-10-CM | POA: Insufficient documentation

## 2019-01-16 DIAGNOSIS — Z87891 Personal history of nicotine dependence: Secondary | ICD-10-CM

## 2019-01-16 DIAGNOSIS — R079 Chest pain, unspecified: Secondary | ICD-10-CM

## 2019-01-16 LAB — BASIC METABOLIC PANEL
ANION GAP: 6 (ref 5–15)
BUN: 16 mg/dL (ref 6–20)
CHLORIDE: 106 mmol/L (ref 98–111)
CO2: 25 mmol/L (ref 22–32)
CREATININE: 0.94 mg/dL (ref 0.61–1.24)
Calcium: 9.5 mg/dL (ref 8.9–10.3)
GFR calc non Af Amer: >60 mL/min (ref >60)
Glucose, Bld: 99 mg/dL (ref 70–99)
POTASSIUM: 4.5 mmol/L (ref 3.5–5.1)
Sodium: 137 mmol/L (ref 135–145)

## 2019-01-16 LAB — CBC
HEMATOCRIT: 43.8 % (ref 39.0–52.0)
HEMOGLOBIN: 13.4 g/dL (ref 13.0–17.0)
MCH: 27.1 pg (ref 26.0–34.0)
MCHC: 30.6 g/dL (ref 30.0–36.0)
MCV: 88.5 fL (ref 80.0–100.0)
NRBC: 0 % (ref 0.0–0.2)
Platelets: 243 10*3/uL (ref 150–400)
RBC: 4.95 MIL/uL (ref 4.22–5.81)
RDW: 13.1 % (ref 11.5–15.5)
WBC: 12.5 10*3/uL — AB (ref 4.0–10.5)

## 2019-01-16 LAB — HEPATIC FUNCTION PANEL
ALT: 13 U/L (ref 0–44)
AST: 16 U/L (ref 15–41)
Albumin: 4.2 g/dL (ref 3.5–5.0)
Alkaline Phosphatase: 67 U/L (ref 38–126)
Bilirubin, Direct: 0.1 mg/dL (ref 0.0–0.2)
Indirect Bilirubin: 0.4 mg/dL (ref 0.3–0.9)
TOTAL PROTEIN: 6.8 g/dL (ref 6.5–8.1)
Total Bilirubin: 0.5 mg/dL (ref 0.3–1.2)

## 2019-01-16 LAB — LIPASE, BLOOD: LIPASE: 26 U/L (ref 11–51)

## 2019-01-16 LAB — TROPONIN I

## 2019-01-16 MED ORDER — LIDOCAINE VISCOUS HCL 2 % MT SOLN
15.0000 mL | Freq: Once | OROMUCOSAL | Status: AC
  Administered 2019-01-16: 15 mL via ORAL
  Filled 2019-01-16: qty 15

## 2019-01-16 MED ORDER — FAMOTIDINE IN NACL 20-0.9 MG/50ML-% IV SOLN
20.0000 mg | Freq: Once | INTRAVENOUS | Status: AC
  Administered 2019-01-16: 20 mg via INTRAVENOUS
  Filled 2019-01-16: qty 50

## 2019-01-16 MED ORDER — ALUM & MAG HYDROXIDE-SIMETH 200-200-20 MG/5ML PO SUSP
30.0000 mL | Freq: Once | ORAL | Status: AC
  Administered 2019-01-16: 30 mL via ORAL
  Filled 2019-01-16: qty 30

## 2019-01-16 MED ORDER — FAMOTIDINE 20 MG PO TABS
20.0000 mg | ORAL_TABLET | Freq: Once | ORAL | Status: AC
  Administered 2019-01-16: 20 mg via ORAL
  Filled 2019-01-16: qty 1

## 2019-01-16 MED ORDER — PANTOPRAZOLE SODIUM 40 MG PO TBEC
40.0000 mg | DELAYED_RELEASE_TABLET | Freq: Once | ORAL | Status: AC
  Administered 2019-01-16: 40 mg via ORAL
  Filled 2019-01-16: qty 1

## 2019-01-16 MED ORDER — SODIUM CHLORIDE 0.9 % IV BOLUS
1000.0000 mL | Freq: Once | INTRAVENOUS | Status: AC
  Administered 2019-01-16: 1000 mL via INTRAVENOUS

## 2019-01-16 MED ORDER — FAMOTIDINE 20 MG PO TABS: 20.0000 mg | ORAL_TABLET | Freq: Two times a day (BID) | ORAL | 0 refills | Status: DC

## 2019-01-16 MED ORDER — HYDROMORPHONE HCL 1 MG/ML IJ SOLN
0.5000 mg | Freq: Once | INTRAMUSCULAR | Status: AC
  Administered 2019-01-16: 0.5 mg via INTRAVENOUS
  Filled 2019-01-16: qty 1

## 2019-01-16 MED ORDER — IOPAMIDOL (ISOVUE-300) INJECTION 61%
100.0000 mL | Freq: Once | INTRAVENOUS | Status: AC | PRN
  Administered 2019-01-16: 100 mL via INTRAVENOUS

## 2019-01-16 MED ORDER — PANTOPRAZOLE SODIUM 20 MG PO TBEC: 20.0000 mg | DELAYED_RELEASE_TABLET | Freq: Every day | ORAL | 1 refills | Status: DC

## 2019-01-16 NOTE — ED Triage Notes (Signed)
Pt c/o right side chest pain that returned after eating today with nausea, was seen in er earlier for the same,
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
## 2019-01-16 NOTE — ED Triage Notes (Signed)
Pt. complains of chest pain since 1100 hours today but this has been on-going about for the last two weeks. Pain usually happens after eating.

## 2019-01-16 NOTE — Discharge Instructions (Signed)
Your lab tests are within normal limits with exception of your white blood cell count being slightly higher than it was during your last visit.  Your vital signs are within normal limits.  Your examination favors gallbladder or biliary tree disease.  Please stop greasy foods and spicy foods.  Please use Pepcid 2 times daily.  Please use Protonix each evening.  I have requested that you have an ultrasound of your gallbladder as an outpatient.  If this is positive, you will be directed to see 1 of the surgeons.  If this is negative, you will be directed to see the GI specialist.

## 2019-01-17 ENCOUNTER — Ambulatory Visit (HOSPITAL_COMMUNITY)
Admission: RE | Admit: 2019-01-17 | Discharge: 2019-01-17 | Disposition: A | Discharge status: Home / Self Care | Payer: Medicaid Other | Source: Ambulatory Visit | Attending: Physician Assistant | Admitting: Physician Assistant

## 2019-01-17 DIAGNOSIS — R079 Chest pain, unspecified: Secondary | ICD-10-CM | POA: Diagnosis not present

## 2019-01-17 DIAGNOSIS — R1011 Right upper quadrant pain: Secondary | ICD-10-CM | POA: Insufficient documentation

## 2019-01-17 DIAGNOSIS — R1901 Right upper quadrant abdominal swelling, mass and lump: Secondary | ICD-10-CM | POA: Insufficient documentation

## 2019-01-17 LAB — I-STAT TROPONIN, ED: Troponin i, poc: 0 ng/mL (ref 0.00–0.08)
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
## 2019-01-17 NOTE — ED Notes (Signed)
Pt ambulatory to waiting room. Pt verbalized understanding of discharge instructions.   

## 2019-01-17 NOTE — Discharge Instructions (Addendum)
Return if any problems. Return tomorrow for formal ultrasound

## 2019-01-17 NOTE — ED Provider Notes (Signed)
Outpatient ultrasound results reviewed.  Ultrasound did not show any cholelithiasis.  Results have been discussed with the patient.   Varney Biles, MD 01/17/19 1416

## 2019-01-19 ENCOUNTER — Other Ambulatory Visit (HOSPITAL_COMMUNITY): Payer: Self-pay | Admitting: Pulmonary Disease

## 2019-01-19 DIAGNOSIS — K828 Other specified diseases of gallbladder: Secondary | ICD-10-CM

## 2019-01-23 ENCOUNTER — Ambulatory Visit (HOSPITAL_COMMUNITY)
Admission: RE | Admit: 2019-01-23 | Discharge: 2019-01-23 | Disposition: A | Discharge status: Home / Self Care | Payer: Medicaid Other | Source: Ambulatory Visit | Attending: Pulmonary Disease | Admitting: Pulmonary Disease

## 2019-01-23 ENCOUNTER — Encounter (HOSPITAL_COMMUNITY): Payer: Self-pay

## 2019-01-23 DIAGNOSIS — K828 Other specified diseases of gallbladder: Secondary | ICD-10-CM | POA: Diagnosis not present

## 2019-01-23 MED ORDER — TECHNETIUM TC 99M MEBROFENIN IV KIT
5.0000 | PACK | Freq: Once | INTRAVENOUS | Status: AC | PRN
  Administered 2019-01-23: 5.48 via INTRAVENOUS

## 2019-01-25 ENCOUNTER — Encounter (HOSPITAL_COMMUNITY): Payer: Self-pay

## 2019-01-25 ENCOUNTER — Other Ambulatory Visit: Payer: Self-pay

## 2019-01-25 ENCOUNTER — Emergency Department (HOSPITAL_COMMUNITY): Payer: Medicaid Other

## 2019-01-25 ENCOUNTER — Emergency Department (HOSPITAL_COMMUNITY)
Admission: EM | Admit: 2019-01-25 | Discharge: 2019-01-25 | Disposition: A | Discharge status: Home / Self Care | Payer: Medicaid Other | Attending: Emergency Medicine | Admitting: Emergency Medicine

## 2019-01-25 DIAGNOSIS — T859XXA Unspecified complication of internal prosthetic device, implant and graft, initial encounter: Secondary | ICD-10-CM | POA: Insufficient documentation

## 2019-01-25 DIAGNOSIS — Z79899 Other long term (current) drug therapy: Secondary | ICD-10-CM | POA: Diagnosis not present

## 2019-01-25 DIAGNOSIS — R0789 Other chest pain: Secondary | ICD-10-CM | POA: Diagnosis present

## 2019-01-25 DIAGNOSIS — Z9889 Other specified postprocedural states: Secondary | ICD-10-CM | POA: Insufficient documentation

## 2019-01-25 DIAGNOSIS — F17220 Nicotine dependence, chewing tobacco, uncomplicated: Secondary | ICD-10-CM | POA: Diagnosis not present

## 2019-01-25 DIAGNOSIS — Y69 Unspecified misadventure during surgical and medical care: Secondary | ICD-10-CM | POA: Diagnosis not present

## 2019-01-25 LAB — BASIC METABOLIC PANEL
Anion gap: 7 (ref 5–15)
BUN: 11 mg/dL (ref 6–20)
CHLORIDE: 104 mmol/L (ref 98–111)
CO2: 27 mmol/L (ref 22–32)
Calcium: 8.9 mg/dL (ref 8.9–10.3)
Creatinine, Ser: 0.74 mg/dL (ref 0.61–1.24)
GFR calc Af Amer: >60 mL/min (ref >60)
GFR calc non Af Amer: >60 mL/min (ref >60)
Glucose, Bld: 109 mg/dL — ABNORMAL HIGH (ref 70–99)
POTASSIUM: 4.2 mmol/L (ref 3.5–5.1)
Sodium: 138 mmol/L (ref 135–145)

## 2019-01-25 LAB — CBC
HEMATOCRIT: 42.9 % (ref 39.0–52.0)
HEMOGLOBIN: 12.9 g/dL — AB (ref 13.0–17.0)
MCH: 26.5 pg (ref 26.0–34.0)
MCHC: 30.1 g/dL (ref 30.0–36.0)
MCV: 88.3 fL (ref 80.0–100.0)
Platelets: 315 10*3/uL (ref 150–400)
RBC: 4.86 MIL/uL (ref 4.22–5.81)
RDW: 12.8 % (ref 11.5–15.5)
WBC: 9.9 10*3/uL (ref 4.0–10.5)
nRBC: 0 % (ref 0.0–0.2)

## 2019-01-25 LAB — D-DIMER, QUANTITATIVE: D-Dimer, Quant: 0.81 ug/mL-FEU — ABNORMAL HIGH (ref 0.00–0.50)

## 2019-01-25 LAB — TROPONIN I: Troponin I: <0.03 ng/mL (ref <0.03)

## 2019-01-25 MED ORDER — SODIUM CHLORIDE 0.9 % IV BOLUS
500.0000 mL | Freq: Once | INTRAVENOUS | Status: AC
  Administered 2019-01-25: 500 mL via INTRAVENOUS

## 2019-01-25 MED ORDER — IOPAMIDOL (ISOVUE-370) INJECTION 76%
150.0000 mL | Freq: Once | INTRAVENOUS | Status: AC | PRN
  Administered 2019-01-25: 100 mL via INTRAVENOUS

## 2019-01-25 MED ORDER — ACETAMINOPHEN 325 MG PO TABS
650.0000 mg | ORAL_TABLET | Freq: Once | ORAL | Status: DC
  Filled 2019-01-25: qty 2

## 2019-01-25 NOTE — Discharge Instructions (Addendum)
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  At this time there does not appear to be the presence of an emergent medical condition, however there is always the potential for conditions to change. Please read and follow the below instructions.  Please return to the Emergency Department immediately for any new or worsening symptoms or if your symptoms do not improve within 5 days. Please be sure to follow up with your Primary Care Provider within 5 days regarding your visit today; please call their office today to schedule an appointment even if you are feeling better for a follow-up visit. The CT scan of your chest today revealed a thickening of the right side, mid/lower portion of your graft.  You will need an endoscopy (camera) for further evaluation of this thickening.  Please call Dr. Olevia Perches office today to schedule an appointment with him for follow-up.  Please call your primary care doctor's office today to schedule a follow-up appointment this week as well. Finally it is very important for you to follow-up with the surgeon who performed your graft as they will likely want to know about this thickening as well.  I have attached Dr. Erline Levine contact information to your discharge paperwork, give them a call today to schedule an appointment. At the end of this page I have attached the read of your CT scan so you may discuss it with your doctors.  Get help right away if: Your chest pain is worse. You have a cough that gets worse, or you cough up blood. You have very bad (severe) pain in your belly (abdomen). You pass out (faint). You have either of these for no clear reason: Sudden chest discomfort. Sudden discomfort in your arms, back, neck, or jaw. You have shortness of breath at any time. You suddenly start to sweat, or your skin gets clammy. You feel sick to your stomach (nauseous). You throw up (vomit). You suddenly feel lightheaded or dizzy. You feel very weak or  tired. Your heart starts to beat fast, or it feels like it is skipping beats. You have fever Any new or worsening symptoms  Please read the additional information packets attached to your discharge summary.  CT Angio Chest  IMPRESSION: 1.  Negative examination for pulmonary embolism. 2. Small, right greater than left pleural effusions, with associated atelectasis or consolidation. 3. Redemonstrated findings of colonic interposition, partially imaged through the chest. There is masslike thickening at the right aspect of the mid to lower portion of the graft (series 5, image 51, series 10, image 41). This is of uncertain significance the although concerning for non-specific inflammation or alternately malignancy. Consider endoscopic evaluation.

## 2019-01-25 NOTE — ED Triage Notes (Signed)
Pt reports r sided chest pain off and on since Jan.  Reports pain is worse with movement and taking deep breaths.  Denies cough or fever.
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
## 2019-01-30 ENCOUNTER — Other Ambulatory Visit (INDEPENDENT_AMBULATORY_CARE_PROVIDER_SITE_OTHER): Payer: Self-pay | Admitting: *Deleted

## 2019-01-30 ENCOUNTER — Encounter (INDEPENDENT_AMBULATORY_CARE_PROVIDER_SITE_OTHER): Payer: Self-pay | Admitting: Internal Medicine

## 2019-01-30 ENCOUNTER — Ambulatory Visit (INDEPENDENT_AMBULATORY_CARE_PROVIDER_SITE_OTHER): Payer: Medicaid Other | Admitting: Internal Medicine

## 2019-01-30 VITALS — BP 116/72 | HR 75 | Temp 98.6°F | Resp 18 | Ht 65.0 in | Wt 139.8 lb

## 2019-01-30 DIAGNOSIS — R9389 Abnormal findings on diagnostic imaging of other specified body structures: Secondary | ICD-10-CM

## 2019-01-30 DIAGNOSIS — R079 Chest pain, unspecified: Secondary | ICD-10-CM

## 2019-01-30 DIAGNOSIS — Z1159 Encounter for screening for other viral diseases: Secondary | ICD-10-CM

## 2019-01-30 DIAGNOSIS — K289 Gastrojejunal ulcer, unspecified as acute or chronic, without hemorrhage or perforation: Secondary | ICD-10-CM

## 2019-01-30 MED ORDER — PANTOPRAZOLE SODIUM 40 MG PO TBEC: 40.0000 mg | DELAYED_RELEASE_TABLET | Freq: Two times a day (BID) | ORAL | 2 refills | Status: DC
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
## 2019-01-30 NOTE — Patient Instructions (Signed)
Esophagogastroduodenoscopy to be scheduled.  
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
## 2019-01-31 DIAGNOSIS — R079 Chest pain, unspecified: Secondary | ICD-10-CM | POA: Insufficient documentation

## 2019-01-31 DIAGNOSIS — K289 Gastrojejunal ulcer, unspecified as acute or chronic, without hemorrhage or perforation: Secondary | ICD-10-CM | POA: Insufficient documentation

## 2019-01-31 LAB — HEPATITIS C ANTIBODY
Hepatitis C Ab: NONREACTIVE
SIGNAL TO CUT-OFF: 0.02 (ref <1.00)

## 2019-02-01 ENCOUNTER — Encounter (HOSPITAL_COMMUNITY)
Admission: RE | Admit: 2019-02-01 | Discharge: 2019-02-01 | Disposition: A | Discharge status: Home / Self Care | Payer: Medicaid Other | Source: Ambulatory Visit | Attending: Internal Medicine | Admitting: Internal Medicine

## 2019-02-03 ENCOUNTER — Encounter (HOSPITAL_COMMUNITY): Payer: Self-pay

## 2019-02-03 ENCOUNTER — Ambulatory Visit (HOSPITAL_COMMUNITY)
Admission: RE | Admit: 2019-02-03 | Discharge: 2019-02-03 | Disposition: A | Discharge status: Home / Self Care | Payer: Medicaid Other | Attending: Internal Medicine | Admitting: Internal Medicine

## 2019-02-03 ENCOUNTER — Ambulatory Visit (HOSPITAL_COMMUNITY): Payer: Medicaid Other | Admitting: Anesthesiology

## 2019-02-03 ENCOUNTER — Other Ambulatory Visit: Payer: Self-pay

## 2019-02-03 ENCOUNTER — Encounter (HOSPITAL_COMMUNITY)
Admission: RE | Disposition: A | Discharge status: Home / Self Care | Payer: Self-pay | Source: Home / Self Care | Attending: Internal Medicine

## 2019-02-03 DIAGNOSIS — R935 Abnormal findings on diagnostic imaging of other abdominal regions, including retroperitoneum: Secondary | ICD-10-CM | POA: Diagnosis not present

## 2019-02-03 DIAGNOSIS — Z980 Intestinal bypass and anastomosis status: Secondary | ICD-10-CM | POA: Diagnosis not present

## 2019-02-03 DIAGNOSIS — R079 Chest pain, unspecified: Secondary | ICD-10-CM | POA: Insufficient documentation

## 2019-02-03 DIAGNOSIS — Z886 Allergy status to analgesic agent status: Secondary | ICD-10-CM | POA: Diagnosis not present

## 2019-02-03 DIAGNOSIS — K219 Gastro-esophageal reflux disease without esophagitis: Secondary | ICD-10-CM | POA: Diagnosis not present

## 2019-02-03 DIAGNOSIS — K289 Gastrojejunal ulcer, unspecified as acute or chronic, without hemorrhage or perforation: Secondary | ICD-10-CM

## 2019-02-03 DIAGNOSIS — Z87891 Personal history of nicotine dependence: Secondary | ICD-10-CM | POA: Insufficient documentation

## 2019-02-03 DIAGNOSIS — Z880 Allergy status to penicillin: Secondary | ICD-10-CM | POA: Insufficient documentation

## 2019-02-03 HISTORY — PX: BIOPSY: SHX5522

## 2019-02-03 HISTORY — PX: ESOPHAGOGASTRODUODENOSCOPY (EGD) WITH PROPOFOL: SHX5813

## 2019-02-03 SURGERY — ESOPHAGOGASTRODUODENOSCOPY (EGD) WITH PROPOFOL
Anesthesia: General

## 2019-02-03 MED ORDER — PROPOFOL 10 MG/ML IV BOLUS
INTRAVENOUS | Status: AC
  Filled 2019-02-03: qty 20

## 2019-02-03 MED ORDER — KETAMINE HCL 10 MG/ML IJ SOLN
INTRAMUSCULAR | Status: DC | PRN
  Administered 2019-02-03: 10 mg via INTRAVENOUS

## 2019-02-03 MED ORDER — LACTATED RINGERS IV SOLN: INTRAVENOUS | Status: DC

## 2019-02-03 MED ORDER — PROPOFOL 10 MG/ML IV BOLUS
INTRAVENOUS | Status: DC | PRN
  Administered 2019-02-03: 15 mg via INTRAVENOUS

## 2019-02-03 MED ORDER — MIDAZOLAM HCL 2 MG/2ML IJ SOLN
INTRAMUSCULAR | Status: AC
  Filled 2019-02-03: qty 2

## 2019-02-03 MED ORDER — METOCLOPRAMIDE HCL 10 MG PO TABS: 10.0000 mg | ORAL_TABLET | Freq: Three times a day (TID) | ORAL | 1 refills | Status: DC

## 2019-02-03 MED ORDER — KETAMINE HCL 50 MG/5ML IJ SOSY
PREFILLED_SYRINGE | INTRAMUSCULAR | Status: AC
  Filled 2019-02-03: qty 5

## 2019-02-03 MED ORDER — PROPOFOL 500 MG/50ML IV EMUL
INTRAVENOUS | Status: DC | PRN
  Administered 2019-02-03: 125 ug/kg/min via INTRAVENOUS

## 2019-02-03 MED ORDER — SUCRALFATE 1 GM/10ML PO SUSP: 1.0000 g | Freq: Four times a day (QID) | ORAL | 5 refills | Status: DC

## 2019-02-03 MED ORDER — CHLORHEXIDINE GLUCONATE CLOTH 2 % EX PADS: 6.0000 | MEDICATED_PAD | Freq: Once | CUTANEOUS | Status: DC

## 2019-02-03 MED ORDER — LACTATED RINGERS IV SOLN
INTRAVENOUS | Status: DC | PRN
  Administered 2019-02-03: 08:00:00 via INTRAVENOUS

## 2019-02-03 MED ORDER — MIDAZOLAM HCL 5 MG/5ML IJ SOLN
INTRAMUSCULAR | Status: DC | PRN
  Administered 2019-02-03: 2 mg via INTRAVENOUS

## 2019-02-03 NOTE — Discharge Instructions (Signed)
No aspirin or NSAIDs. Resume usual medications as before. Sucralfate 1 g by mouth 1 hour before each meal and at bedtime. Metoclopramide 10 mg by mouth 30 minutes before each meal 3 times a day. Resume usual diet. No driving for 24 hours. Physician will call with biopsy results.  Upper Endoscopy, Adult, Care After This sheet gives you information about how to care for yourself after your procedure. Your health care provider may also give you more specific instructions. If you have problems or questions, contact your health care provider. What can I expect after the procedure? After the procedure, it is common to have:  A sore throat.  Mild stomach pain or discomfort.  Bloating.  Nausea. Follow these instructions at home:   Follow instructions from your health care provider about what to eat or drink after your procedure.  Return to your normal activities as told by your health care provider. Ask your health care provider what activities are safe for you.  Take over-the-counter and prescription medicines only as told by your health care provider.  Do not drive for 24 hours if you were given a sedative during your procedure.  Keep all follow-up visits as told by your health care provider. This is important. Contact a health care provider if you have:  A sore throat that lasts longer than one day.  Trouble swallowing. Get help right away if:  You vomit blood or your vomit looks like coffee grounds.  You have: ? A fever. ? Bloody, black, or tarry stools. ? A severe sore throat or you cannot swallow. ? Difficulty breathing. ? Severe pain in your chest or abdomen. Summary  After the procedure, it is common to have a sore throat, mild stomach discomfort, bloating, and nausea.  Do not drive for 24 hours if you were given a sedative during the procedure.  Follow instructions from your health care provider about what to eat or drink after your procedure.  Return to your  normal activities as told by your health care provider. This information is not intended to replace advice given to you by your health care provider. Make sure you discuss any questions you have with your health care provider. Document Released: 05/24/2012 Document Revised: 04/25/2018 Document Reviewed: 04/25/2018 Elsevier Interactive Patient Education  2019 [employer].

## 2019-02-03 NOTE — Interval H&P Note (Signed)
Patient states he had more pain last night and right pectoral region but he did not have any nausea or vomiting.  He has not experienced melena or rectal bleeding.   He has poor dental hygiene.  Examination otherwise is unchanged.  History and Physical Interval Note:  02/03/2019 8:12 AM  Thomas [name]  has presented today for surgery, with the diagnosis of Right sided Chest Pain, Anastomotic  Ulcer  The various methods of treatment have been discussed with the patient and family. After consideration of risks, benefits and other options for treatment, the patient has consented to  Procedure(s): ESOPHAGOGASTRODUODENOSCOPY (EGD) WITH PROPOFOL (N/A) as a surgical intervention .  The patient's history has been reviewed, patient examined, no change in status, stable for surgery.  I have reviewed the patient's chart and labs.  Questions were answered to the patient's satisfaction.     [employer]

## 2019-02-03 NOTE — Op Note (Signed)
[facility] Patient Name: Thomas [name] Procedure Date: 02/03/2019 8:37 AM MRN: [id number] Date of Birth: 04/22/1971 Attending MD: Hildred Laser , MD CSN: [id number] Age: 48 Admit Type: Outpatient Procedure:                Upper GI endoscopy Indications:              Abnormal CT, Chest pain (non cardiac) Providers:                Hildred Laser, MD, Otis Peak B. Sharon Seller, RN, Aram Candela Referring MD:             Jasper Loser. Luan Pulling MD, MD Medicines:                Monitored Anesthesia Care Complications:            No immediate complications. Estimated Blood Loss:     Estimated blood loss was minimal. Procedure:                Pre-Anesthesia Assessment:                           - Prior to the procedure, a History and Physical                            was performed, and patient medications and                            allergies were reviewed. The patient's tolerance of                            previous anesthesia was also reviewed. The risks                            and benefits of the procedure and the sedation                            options and risks were discussed with the patient.                            All questions were answered, and informed consent                            was obtained. Prior Anticoagulants: The patient has                            taken no previous anticoagulant or antiplatelet                            agents. ASA Grade Assessment: III - A patient with                            severe systemic disease. After reviewing the risks  and benefits, the patient was deemed in                            satisfactory condition to undergo the procedure.                           After obtaining informed consent, the endoscope was                            passed under direct vision. Throughout the                            procedure, the patient's blood pressure, pulse, and            oxygen saturations were monitored continuously. The                            [id number]-[id number] ([id number]) was introduced through the and                            advanced to the second part of duodenum. The upper                            GI endoscopy was accomplished without difficulty.                            The patient tolerated the procedure well. Scope In: 9:00:25 AM Scope Out: 9:09:38 AM Total Procedure Duration: 0 hours 9 minutes 13 seconds  Findings:      The hypopharynx was normal.      The proximal esophagus was normal.      An esophago-colonic anastomosis was found in the proximal esophagus.      Large ulcer noted distal to anastamosis.Small orifice noted above this       ulcer with normal surrounding mucosa.      , This was biopsied with a cold forceps for histology.      Evidence of cologastric were found in the anastomosis. It was patent.      The entire examined stomach was normal.      The duodenal bulb and second portion of the duodenum were normal. Impression:               - Normal hypopharynx.                           - Normal proximal esophagus.                           - An esophago-colonic anastomosis was found.                           - Large ulcer noted diatal to anastamosis.Biopsied.                           - Tiny orifice noted proximal to ulcerated area,                           -  Patent cologastric anastamosis.                           - Normal stomach.                           - Normal duodenal bulb and second portion of the                            duodenum. Moderate Sedation:      Per Anesthesia Care Recommendation:           - Patient has a contact number available for                            emergencies. The signs and symptoms of potential                            delayed complications were discussed with the                            patient. Return to normal activities tomorrow.                            Written discharge  instructions were provided to the                            patient.                           - Patient has a contact number available for                            emergencies. The signs and symptoms of potential                            delayed complications were discussed with the                            patient. Return to normal activities tomorrow.                            Written discharge instructions were provided to the                            patient.                           - Resume previous diet today.                           - Continue present medications.                           - No aspirin, ibuprofen, naproxen, or other  non-steroidal anti-inflammatory drugs.                           - Use metoclopramide 10 mg PO QID; 30 min AC.                           - Use sucralfate tablets 1 gram PO qid.                           - Await pathology results. Procedure Code(s):        --- Professional ---                           [phone], Esophagogastroduodenoscopy, flexible,                            transoral; with biopsy, single or multiple Diagnosis Code(s):        --- Professional ---                           Z98.0, Intestinal bypass and anastomosis status                           R07.89, Other chest pain CPT copyright 2018 American Medical Association. All rights reserved. The codes documented in this report are preliminary and upon coder review may  be revised to meet current compliance requirements. Hildred Laser, MD Hildred Laser, MD 02/03/2019 9:31:04 AM This report has been signed electronically. Number of Addenda: 0

## 2019-02-03 NOTE — Anesthesia Postprocedure Evaluation (Signed)
Anesthesia Post Note  Patient: Thomas [name]  Procedure(s) Performed: ESOPHAGOGASTRODUODENOSCOPY (EGD) WITH PROPOFOL (N/A ) BIOPSY  Patient location during evaluation: PACU Anesthesia Type: MAC Level of consciousness: awake and patient cooperative Pain management: pain level controlled Vital Signs Assessment: post-procedure vital signs reviewed and stable Respiratory status: spontaneous breathing, nonlabored ventilation and respiratory function stable Cardiovascular status: blood pressure returned to baseline Postop Assessment: no apparent nausea or vomiting Anesthetic complications: no     Last Vitals:  Vitals:   02/03/19 0804 02/03/19 0919  BP: 116/71 (P) 101/60  Pulse: 72   Resp: 16   Temp: (!) 36.4 C (P) 36.6 C  SpO2: 96%     Last Pain:  Vitals:   02/03/19 0854  TempSrc:   PainSc: 3                  Veronica Guerrant J

## 2019-02-03 NOTE — Anesthesia Preprocedure Evaluation (Signed)
Anesthesia Evaluation  Patient identified by MRN, date of birth, ID band Patient awake    Reviewed: Allergy & Precautions, H&P , NPO status , Patient's Chart, lab work & pertinent test results, reviewed documented beta blocker date and time   Airway Mallampati: II  TM Distance: >3 FB Neck ROM: full    Dental no notable dental hx.    Pulmonary neg pulmonary ROS, former smoker,    Pulmonary exam normal breath sounds clear to auscultation       Cardiovascular Exercise Tolerance: Good negative cardio ROS   Rhythm:regular Rate:Normal     Neuro/Psych negative neurological ROS  negative psych ROS   GI/Hepatic Neg liver ROS, GERD  ,Congenital TE fistula with multiple surgical procedures as a child    Endo/Other  negative endocrine ROS  Renal/GU negative Renal ROS  negative genitourinary   Musculoskeletal   Abdominal   Peds  Hematology negative hematology ROS (+)   Anesthesia Other Findings   Reproductive/Obstetrics negative OB ROS                             Anesthesia Physical Anesthesia Plan  ASA: II  Anesthesia Plan: General   Post-op Pain Management:    Induction:   PONV Risk Score and Plan:   Airway Management Planned:   Additional Equipment:   Intra-op Plan:   Post-operative Plan:   Informed Consent: I have reviewed the patients History and Physical, chart, labs and discussed the procedure including the risks, benefits and alternatives for the proposed anesthesia with the patient or authorized representative who has indicated his/her understanding and acceptance.     Dental Advisory Given  Plan Discussed with: CRNA  Anesthesia Plan Comments:         Anesthesia Quick Evaluation

## 2019-02-03 NOTE — Addendum Note (Signed)
Addendum  created 02/03/19 1420 by Charmaine Downs, CRNA   Clinical Note Signed

## 2019-02-03 NOTE — Transfer of Care (Signed)
Immediate Anesthesia Transfer of Care Note  Patient: Thomas [name]  Procedure(s) Performed: ESOPHAGOGASTRODUODENOSCOPY (EGD) WITH PROPOFOL (N/A ) BIOPSY  Patient Location: PACU  Anesthesia Type:MAC  Level of Consciousness: awake and patient cooperative  Airway & Oxygen Therapy: Patient Spontanous Breathing and Patient connected to nasal cannula oxygen  Post-op Assessment: Report given to RN, Post -op Vital signs reviewed and stable and Patient moving all extremities  Post vital signs: Reviewed and stable  Last Vitals:  Vitals Value Taken Time  BP    Temp    Pulse 79 02/03/2019  9:17 AM  Resp    SpO2 98 % 02/03/2019  9:17 AM  Vitals shown include unvalidated device data.  Last Pain:  Vitals:   02/03/19 0854  TempSrc:   PainSc: 3       Patients Stated Pain Goal: 5 (76/39/43 2003)  Complications: No apparent anesthesia complications

## 2019-02-03 NOTE — Addendum Note (Signed)
Addendum  created 02/03/19 1354 by Charmaine Downs, CRNA   Intraprocedure Flowsheets edited

## 2019-02-03 NOTE — Anesthesia Postprocedure Evaluation (Signed)
Anesthesia Post Note  Patient: Thomas [name]  Procedure(s) Performed: ESOPHAGOGASTRODUODENOSCOPY (EGD) WITH PROPOFOL (N/A ) BIOPSY  Patient location during evaluation: PACU Anesthesia Type: General Level of consciousness: awake and alert and patient cooperative Pain management: pain level controlled Vital Signs Assessment: post-procedure vital signs reviewed and stable Respiratory status: spontaneous breathing, nonlabored ventilation and respiratory function stable Cardiovascular status: blood pressure returned to baseline Postop Assessment: no apparent nausea or vomiting Anesthetic complications: no     Last Vitals:  Vitals:   02/03/19 0930 02/03/19 0945  BP: 107/74 102/66  Pulse:  66  Resp:  16  Temp:  36.7 C  SpO2:  100%    Last Pain:  Vitals:   02/03/19 0945  TempSrc: Oral  PainSc: 3                  Sephora Boyar J

## 2019-02-07 ENCOUNTER — Other Ambulatory Visit (INDEPENDENT_AMBULATORY_CARE_PROVIDER_SITE_OTHER): Payer: Self-pay | Admitting: Internal Medicine

## 2019-02-07 DIAGNOSIS — R079 Chest pain, unspecified: Secondary | ICD-10-CM

## 2019-02-07 DIAGNOSIS — K289 Gastrojejunal ulcer, unspecified as acute or chronic, without hemorrhage or perforation: Secondary | ICD-10-CM

## 2019-02-07 DIAGNOSIS — R9389 Abnormal findings on diagnostic imaging of other specified body structures: Secondary | ICD-10-CM

## 2019-02-07 NOTE — Progress Notes (Signed)
Upper gi

## 2019-02-08 ENCOUNTER — Encounter (HOSPITAL_COMMUNITY): Payer: Self-pay | Admitting: Internal Medicine

## 2019-02-10 ENCOUNTER — Other Ambulatory Visit (INDEPENDENT_AMBULATORY_CARE_PROVIDER_SITE_OTHER): Payer: Self-pay | Admitting: Internal Medicine

## 2019-02-10 ENCOUNTER — Ambulatory Visit (HOSPITAL_COMMUNITY)
Admission: RE | Admit: 2019-02-10 | Discharge: 2019-02-10 | Disposition: A | Discharge status: Home / Self Care | Payer: Medicaid Other | Source: Ambulatory Visit | Attending: Internal Medicine | Admitting: Internal Medicine

## 2019-02-10 DIAGNOSIS — R9389 Abnormal findings on diagnostic imaging of other specified body structures: Secondary | ICD-10-CM

## 2019-02-10 DIAGNOSIS — K289 Gastrojejunal ulcer, unspecified as acute or chronic, without hemorrhage or perforation: Secondary | ICD-10-CM | POA: Diagnosis present

## 2019-02-10 DIAGNOSIS — R079 Chest pain, unspecified: Secondary | ICD-10-CM | POA: Insufficient documentation

## 2019-03-27 IMAGING — DX DG ABDOMEN ACUTE W/ 1V CHEST
3 series · 3 of 3 positions shown · non-contrast
Comparison: Prior CT from 06/11/2016.

CLINICAL DATA: Initial evaluation for acute right-sided chest pain,
shortness of breath.

EXAM:
DG ABDOMEN ACUTE W/ 1V CHEST

[chest pa]
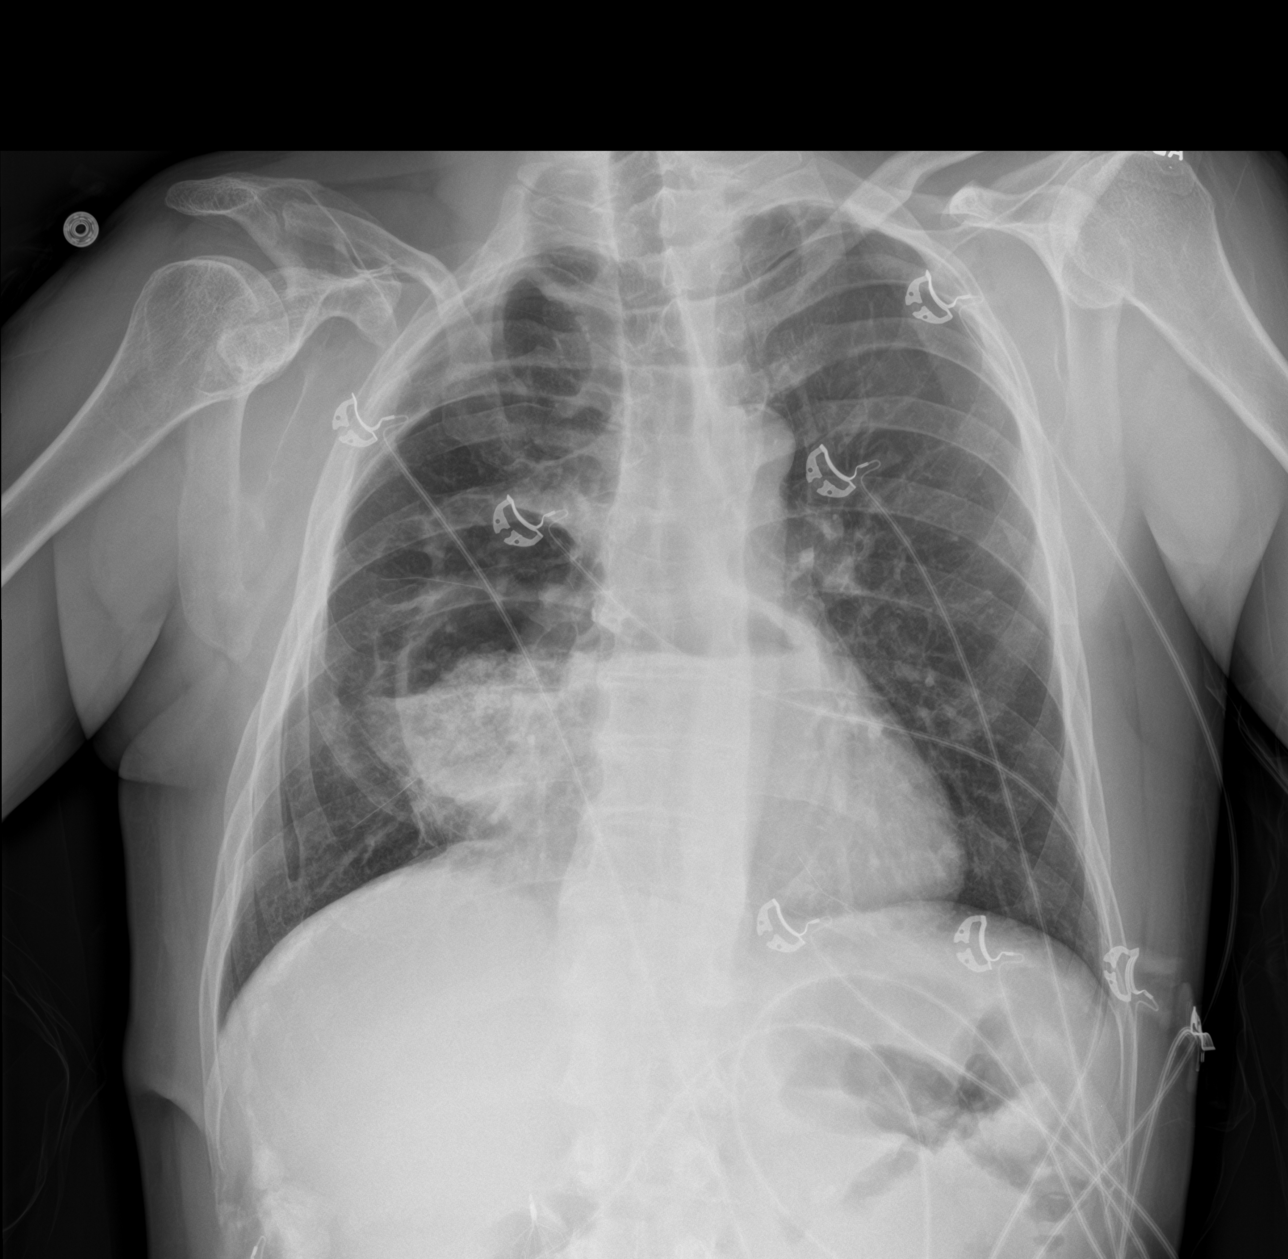

[abdomen erect]
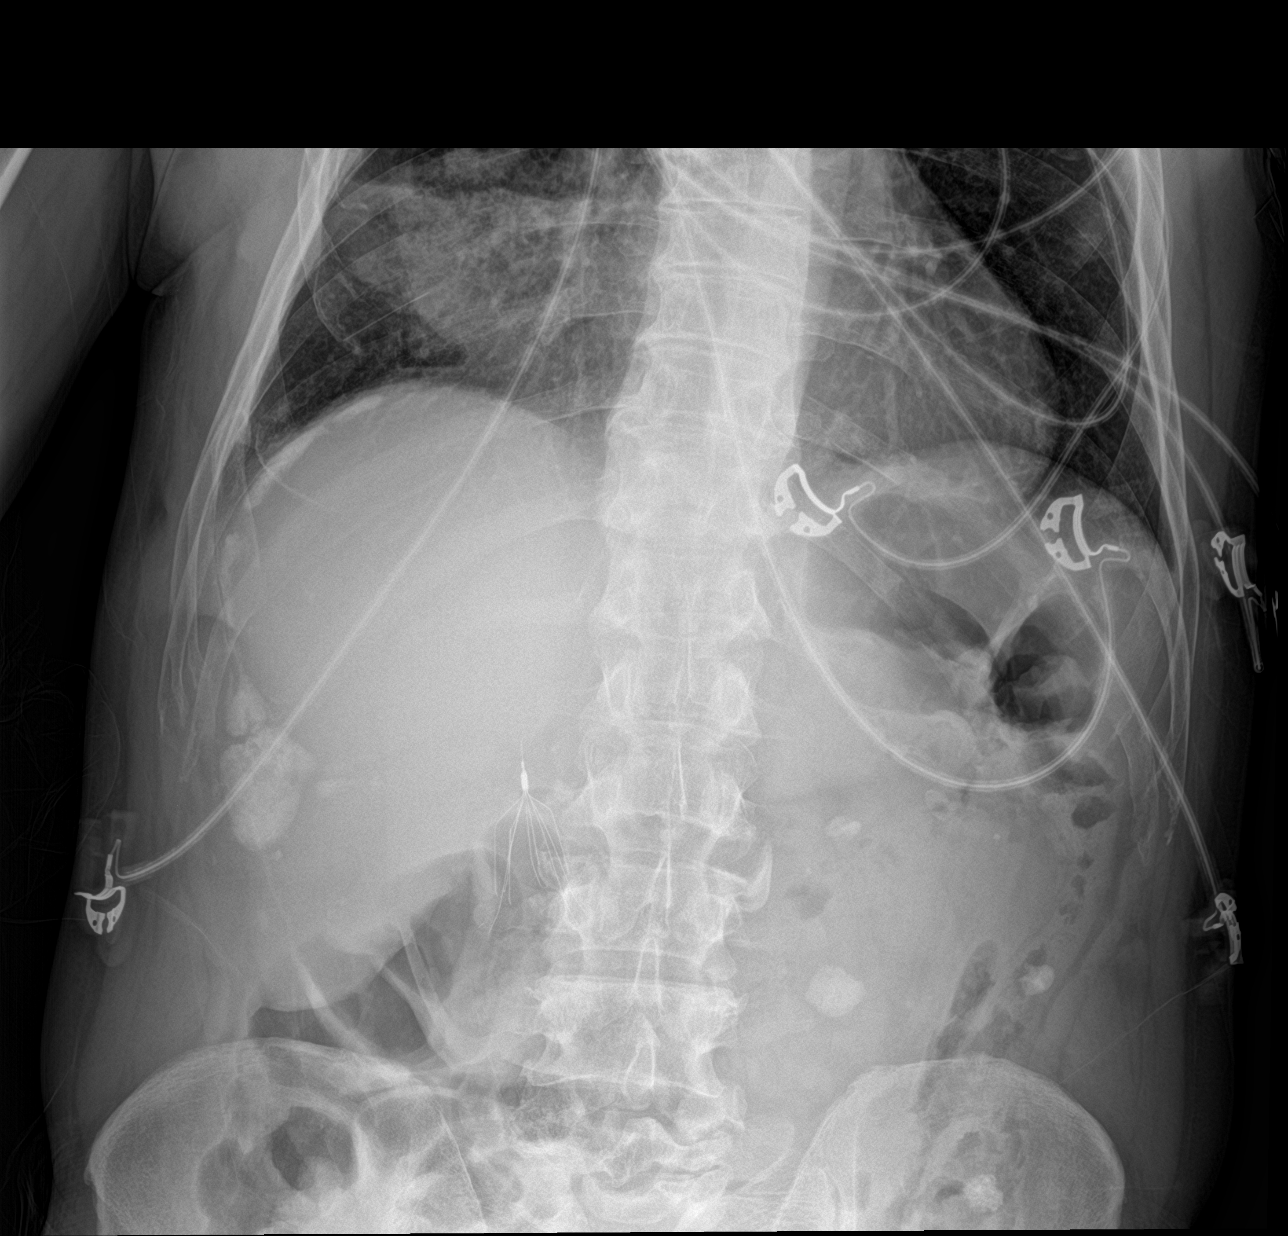

[abdomen supine]
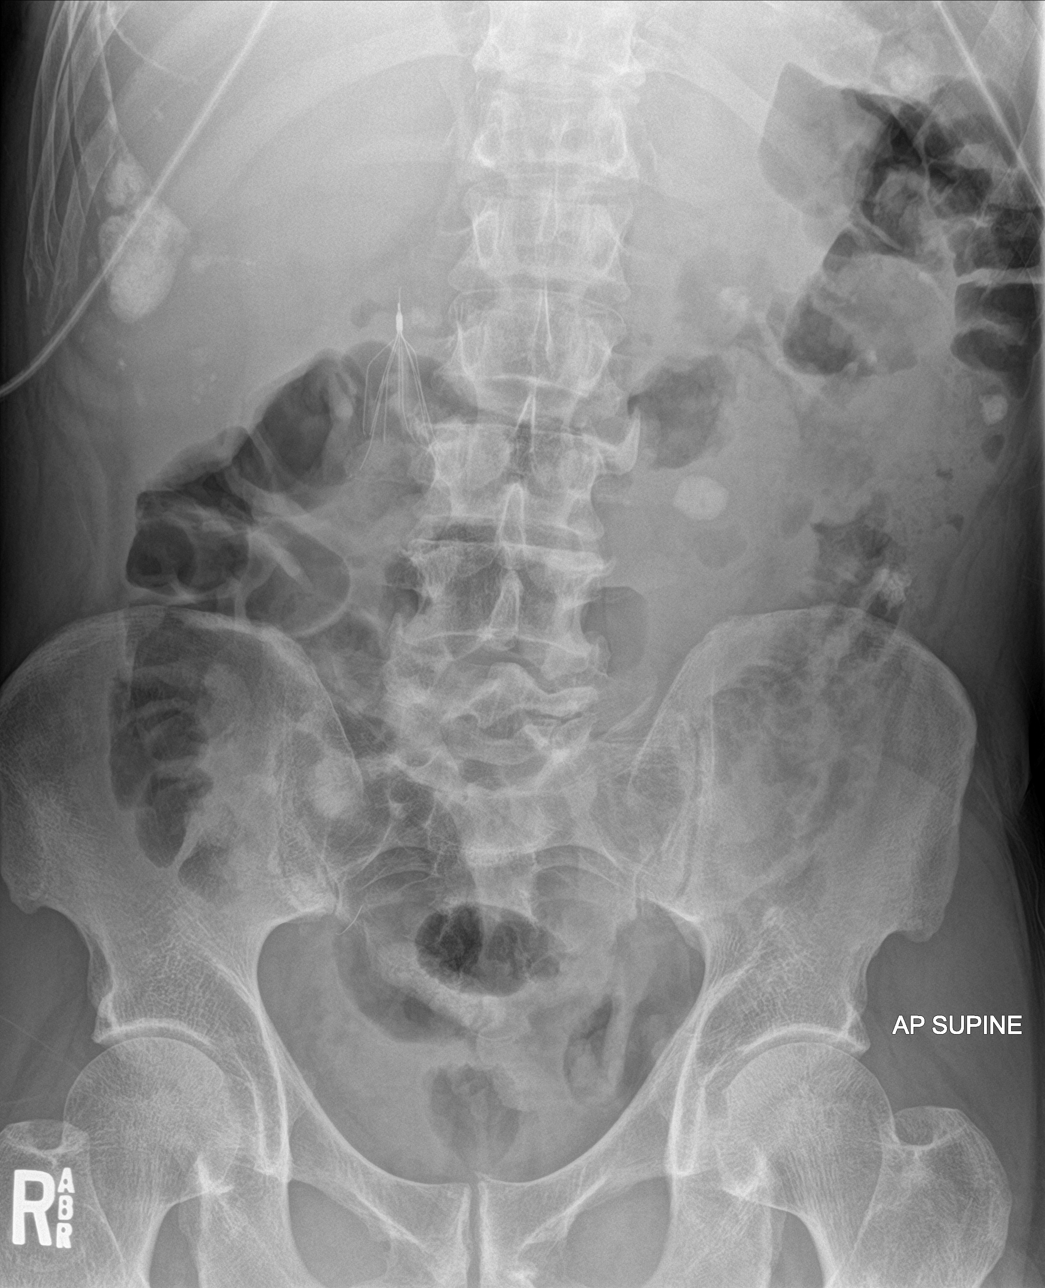

[3 of 3 positions shown; findings below may reference images not displayed]

FINDINGS: Cardiac and mediastinal silhouettes are within normal limits.
Sequelae of prior colonic interposition graft with air-fluid level
overlying the lower mid and right chest, grossly similar from
previous CT.

Lungs normally inflated. Mild right basilar atelectasis. No focal
infiltrates. No edema or effusion. No pneumothorax.

Bowel gas pattern is nonobstructive. No free air on upright view. No
abnormal bowel wall thickening. Scattered coarse calcifications
throughout the abdomen overlying liver, stable from prior CT. IVC
filter in place.

Visualized osseous structures demonstrate no acute finding.
IMPRESSION: 1. Nonobstructive bowel gas pattern with no radiographic evidence
for acute intra-abdominal process.
2. Sequelae of prior colonic interposition at the lower mid and
right lung base with associated right basilar atelectasis. No other
active cardiopulmonary disease.
3. Scattered coarse calcifications throughout the abdomen, stable
from prior CT.
4. IVC filter in place.

## 2019-03-28 ENCOUNTER — Other Ambulatory Visit: Payer: Self-pay

## 2019-03-28 ENCOUNTER — Ambulatory Visit (INDEPENDENT_AMBULATORY_CARE_PROVIDER_SITE_OTHER): Payer: Medicaid Other | Admitting: Internal Medicine

## 2019-03-28 ENCOUNTER — Encounter (INDEPENDENT_AMBULATORY_CARE_PROVIDER_SITE_OTHER): Payer: Self-pay | Admitting: Internal Medicine

## 2019-03-28 DIAGNOSIS — K2100 Gastro-esophageal reflux disease with esophagitis, without bleeding: Secondary | ICD-10-CM

## 2019-03-28 DIAGNOSIS — K289 Gastrojejunal ulcer, unspecified as acute or chronic, without hemorrhage or perforation: Secondary | ICD-10-CM | POA: Diagnosis not present

## 2019-03-28 MED ORDER — SUCRALFATE 1 GM/10ML PO SUSP: 1.0000 g | Freq: Three times a day (TID) | ORAL | 3 refills | Status: DC

## 2019-03-28 MED ORDER — PANTOPRAZOLE SODIUM 40 MG PO TBEC: 40.0000 mg | DELAYED_RELEASE_TABLET | Freq: Two times a day (BID) | ORAL | 2 refills | Status: DC

## 2019-03-28 NOTE — Patient Instructions (Signed)
Prescription for pantoprazole and sucralfate sent to patient's pharmacy. Medication list updated.  Metoclopramide removed from the list of his medications. Esophagogastroduodenoscopy in 4 to 6 weeks depending on the schedule.

## 2019-03-28 NOTE — Progress Notes (Signed)
Virtual Visit via Telephone Note  Patient was last seen in the office on 01/30/2019.  He underwent EGD on 02/03/2019.  He was found to have large ulcer just distal to esophago-colonic anastomosis.  Biopsy was negative.  Patient had schedule office visit today.  Visit was canceled because of ongoing COVID-19 pandemic.  Patient requested tele-visit and I agreed.  [name] was paged I connected with [name] on 03/28/19 at  9:00 AM EDT by telephone and verified that I am speaking with the correct person using two identifiers.   I discussed the limitations, risks, security and privacy concerns of performing an evaluation and management service by telephone and the availability of in person appointments. I also discussed with the patient that there may be a patient responsible charge related to this service. The patient expressed understanding and agreed to proceed.   History of Present Illness:  Patient is 48 year old Caucasian male who was seen in office on 01/30/2019 for recurrent right-sided chest pain radiating to his back and had abnormal CT revealing thickening to GI tract and thoracic region.  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  EGD revealed ulceration just distal to esophago-colonic anastomosis.  Was a benign histology.  Patient was begun on pantoprazole and metoclopramide. Patient states he stop metoclopramide after few days because he felt it caused knots in his throat.  He returned for barium study.  He is on sucralfate and pantoprazole.  He states he is feeling better but not completely pain-free.  He has not had an episode of severe intractable pain that landed in emergency room but he has more pain with certain foods.  He has not had dysphagia nausea or vomiting.  He says he eats 3 meals a day and snacks in between.  He is on pain medication for  chronic pain and generally takes 2 tablets/day.  He can take as many as 4/day. He denies abdominal pain.   Observations/Objective:  Patient's weight reported to be 140 pounds. He appeared to be common collected during tele-visit.  Upper GI series performed on 02/10/2019 by Dr. Lavonia Dana. I have gone over these images with Dr. Thornton Papas.  Colonic interposition extended between cervical esophagus to anterior gastric wall.  There was no stricture.  Irregularity noted to mucosa proximal colonic segment consistent with EGD findings of an ulcer.  Questionable wall thickening of mid colon interposition along the right lateral aspect.  This corresponds to CT abnormality.  GE reflux noted into short segment of residual native esophagus.    Assessment and Plan:  Right-sided chest pain secondary to ulcer involving proximal margin of interposed colon.  He is status post colonic interposition for tracheoesophageal fistula.  Etiology of this ulcer is unclear but may be related to chronic acid exposure to colonic mucosa.  He is symptomatically improving with therapy. Patient will continue pantoprazole and sucralfate at current dose.  Both medications refilled. He will undergo esophagogastroduodenoscopy 4 to 6 weeks.  Follow Up Instructions:   Patient once again advised not to take OTC NSAIDs. If pain worsens he will contact office. Office visit in 6 months but may change depending on findings of follow-up EGD. I discussed the assessment and treatment plan with the patient. The patient was provided an opportunity to ask questions and all were answered. The patient agreed with the plan and demonstrated an understanding of the instructions.   The patient was advised to  call back or seek an in-person evaluation if the symptoms worsen or if the condition fails to improve as anticipated.  I provided 8 minutes of non-face-to-face time during this encounter.   Hildred Laser, MD

## 2019-03-29 ENCOUNTER — Emergency Department (HOSPITAL_COMMUNITY): Payer: Medicaid Other

## 2019-03-29 ENCOUNTER — Emergency Department (HOSPITAL_COMMUNITY)
Admission: EM | Admit: 2019-03-29 | Discharge: 2019-03-29 | Disposition: A | Discharge status: Home / Self Care | Payer: Medicaid Other | Attending: Emergency Medicine | Admitting: Emergency Medicine

## 2019-03-29 ENCOUNTER — Other Ambulatory Visit: Payer: Self-pay

## 2019-03-29 ENCOUNTER — Encounter (HOSPITAL_COMMUNITY): Payer: Self-pay | Admitting: Emergency Medicine

## 2019-03-29 DIAGNOSIS — Z79899 Other long term (current) drug therapy: Secondary | ICD-10-CM | POA: Diagnosis not present

## 2019-03-29 DIAGNOSIS — Q396 Congenital diverticulum of esophagus: Secondary | ICD-10-CM

## 2019-03-29 DIAGNOSIS — K289 Gastrojejunal ulcer, unspecified as acute or chronic, without hemorrhage or perforation: Secondary | ICD-10-CM | POA: Insufficient documentation

## 2019-03-29 DIAGNOSIS — R079 Chest pain, unspecified: Secondary | ICD-10-CM | POA: Diagnosis present

## 2019-03-29 DIAGNOSIS — Z87891 Personal history of nicotine dependence: Secondary | ICD-10-CM | POA: Insufficient documentation

## 2019-03-29 DIAGNOSIS — M795 Residual foreign body in soft tissue: Secondary | ICD-10-CM

## 2019-03-29 DIAGNOSIS — K225 Diverticulum of esophagus, acquired: Secondary | ICD-10-CM | POA: Insufficient documentation

## 2019-03-29 LAB — COMPREHENSIVE METABOLIC PANEL
ALT: 16 U/L (ref 0–44)
AST: 16 U/L (ref 15–41)
Albumin: 4.3 g/dL (ref 3.5–5.0)
Alkaline Phosphatase: 83 U/L (ref 38–126)
Anion gap: 8 (ref 5–15)
BUN: 14 mg/dL (ref 6–20)
CO2: 26 mmol/L (ref 22–32)
Calcium: 8.9 mg/dL (ref 8.9–10.3)
Chloride: 101 mmol/L (ref 98–111)
Creatinine, Ser: 0.86 mg/dL (ref 0.61–1.24)
GFR calc Af Amer: >60 mL/min (ref >60)
GFR calc non Af Amer: >60 mL/min (ref >60)
Glucose, Bld: 97 mg/dL (ref 70–99)
Potassium: 3.9 mmol/L (ref 3.5–5.1)
Sodium: 135 mmol/L (ref 135–145)
Total Bilirubin: 0.4 mg/dL (ref 0.3–1.2)
Total Protein: 7.1 g/dL (ref 6.5–8.1)

## 2019-03-29 LAB — CBC WITH DIFFERENTIAL/PLATELET
Abs Immature Granulocytes: 0.04 10*3/uL (ref 0.00–0.07)
Basophils Absolute: 0.1 10*3/uL (ref 0.0–0.1)
Basophils Relative: 1 %
Eosinophils Absolute: 0.2 10*3/uL (ref 0.0–0.5)
Eosinophils Relative: 2 %
HCT: 43.9 % (ref 39.0–52.0)
Hemoglobin: 14.2 g/dL (ref 13.0–17.0)
Immature Granulocytes: 0 %
Lymphocytes Relative: 26 %
Lymphs Abs: 3 10*3/uL (ref 0.7–4.0)
MCH: 28.8 pg (ref 26.0–34.0)
MCHC: 32.3 g/dL (ref 30.0–36.0)
MCV: 89 fL (ref 80.0–100.0)
Monocytes Absolute: 1.2 10*3/uL — ABNORMAL HIGH (ref 0.1–1.0)
Monocytes Relative: 10 %
Neutro Abs: 6.9 10*3/uL (ref 1.7–7.7)
Neutrophils Relative %: 61 %
Platelets: 257 10*3/uL (ref 150–400)
RBC: 4.93 MIL/uL (ref 4.22–5.81)
RDW: 13.4 % (ref 11.5–15.5)
WBC: 11.3 10*3/uL — ABNORMAL HIGH (ref 4.0–10.5)
nRBC: 0 % (ref 0.0–0.2)

## 2019-03-29 LAB — TROPONIN I: Troponin I: <0.03 ng/mL (ref <0.03)

## 2019-03-29 LAB — LIPASE, BLOOD: Lipase: 28 U/L (ref 11–51)

## 2019-03-29 MED ORDER — ALUM & MAG HYDROXIDE-SIMETH 200-200-20 MG/5ML PO SUSP
30.0000 mL | Freq: Once | ORAL | Status: AC
  Administered 2019-03-29: 30 mL via ORAL
  Filled 2019-03-29: qty 30

## 2019-03-29 MED ORDER — FAMOTIDINE IN NACL 20-0.9 MG/50ML-% IV SOLN
20.0000 mg | Freq: Once | INTRAVENOUS | Status: AC
  Administered 2019-03-29: 20 mg via INTRAVENOUS
  Filled 2019-03-29: qty 50

## 2019-03-29 MED ORDER — METRONIDAZOLE 500 MG PO TABS: 500.0000 mg | ORAL_TABLET | Freq: Three times a day (TID) | ORAL | 0 refills | Status: AC

## 2019-03-29 MED ORDER — FENTANYL CITRATE (PF) 100 MCG/2ML IJ SOLN
50.0000 ug | INTRAMUSCULAR | Status: DC | PRN
  Administered 2019-03-29: 50 ug via INTRAMUSCULAR
  Filled 2019-03-29: qty 2

## 2019-03-29 MED ORDER — IOHEXOL 300 MG/ML  SOLN
100.0000 mL | Freq: Once | INTRAMUSCULAR | Status: AC | PRN
  Administered 2019-03-29: 100 mL via INTRAVENOUS

## 2019-03-29 MED ORDER — LIDOCAINE VISCOUS HCL 2 % MT SOLN
15.0000 mL | Freq: Once | OROMUCOSAL | Status: AC
  Administered 2019-03-29: 15 mL via ORAL
  Filled 2019-03-29: qty 15

## 2019-03-29 MED ORDER — CIPROFLOXACIN HCL 500 MG PO TABS: 500.0000 mg | ORAL_TABLET | Freq: Two times a day (BID) | ORAL | 0 refills | Status: AC

## 2019-03-29 NOTE — Discharge Instructions (Addendum)
Your CT scan showed an incidental finding(s):  "1) Retained foreign body within the right ventricle, which appears to be an embolized limb of the IVC filter. This has been present at least since the CT dated 06/11/2016; 2) The inflammation appears to involve a blind ending outpouching of the graft, superior laterally directed, and does not appear to have been opacified on the esophagram dated 02/10/2019. Follow-up with gastroenterology recommended, as this is suspected to represent inflamed diverticulum." Take the new prescriptions as directed. Increase your fluids for the next several days. Eat a bland diet, avoiding greasy, fatty, fried foods, as well as spicy and acidic foods or beverages.  Avoid eating within 2 to 3 hours before going to bed or laying down.  Also avoid teas, colas, coffee, chocolate, pepermint and spearment. May also take over the counter maalox/mylanta, as directed on packaging, as needed for discomfort. Call your regular GI doctor tomorrow to schedule a follow up appointment within the next week regarding the possibly inflamed diverticulum. The Cardiothoracic Surgeon's office will call you tomorrow to schedule a follow up appointment regarding any further testing/intervention needed for the incidental CT scan finding above.  Return to the Emergency Department immediately sooner if worsening.
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
## 2019-03-29 NOTE — ED Notes (Signed)
Pt ambulatory to waiting room. Pt verbalized understanding of discharge instructions.   

## 2019-03-29 NOTE — ED Triage Notes (Signed)
P C/O chest pain after eating tonight. Pt states he talked to Dr Laural Golden yesterday and was prescribed pantoprazole and Carafate. Pt states he took those as prescribed today and "still had chest pain after eating."

## 2019-04-13 IMAGING — DX DG CHEST 2V
2 series · 2 of 2 positions shown · non-contrast
Comparison: December 30, 2018

CLINICAL DATA: Chest pain.

EXAM:
CHEST - 2 VIEW

[chest pa]
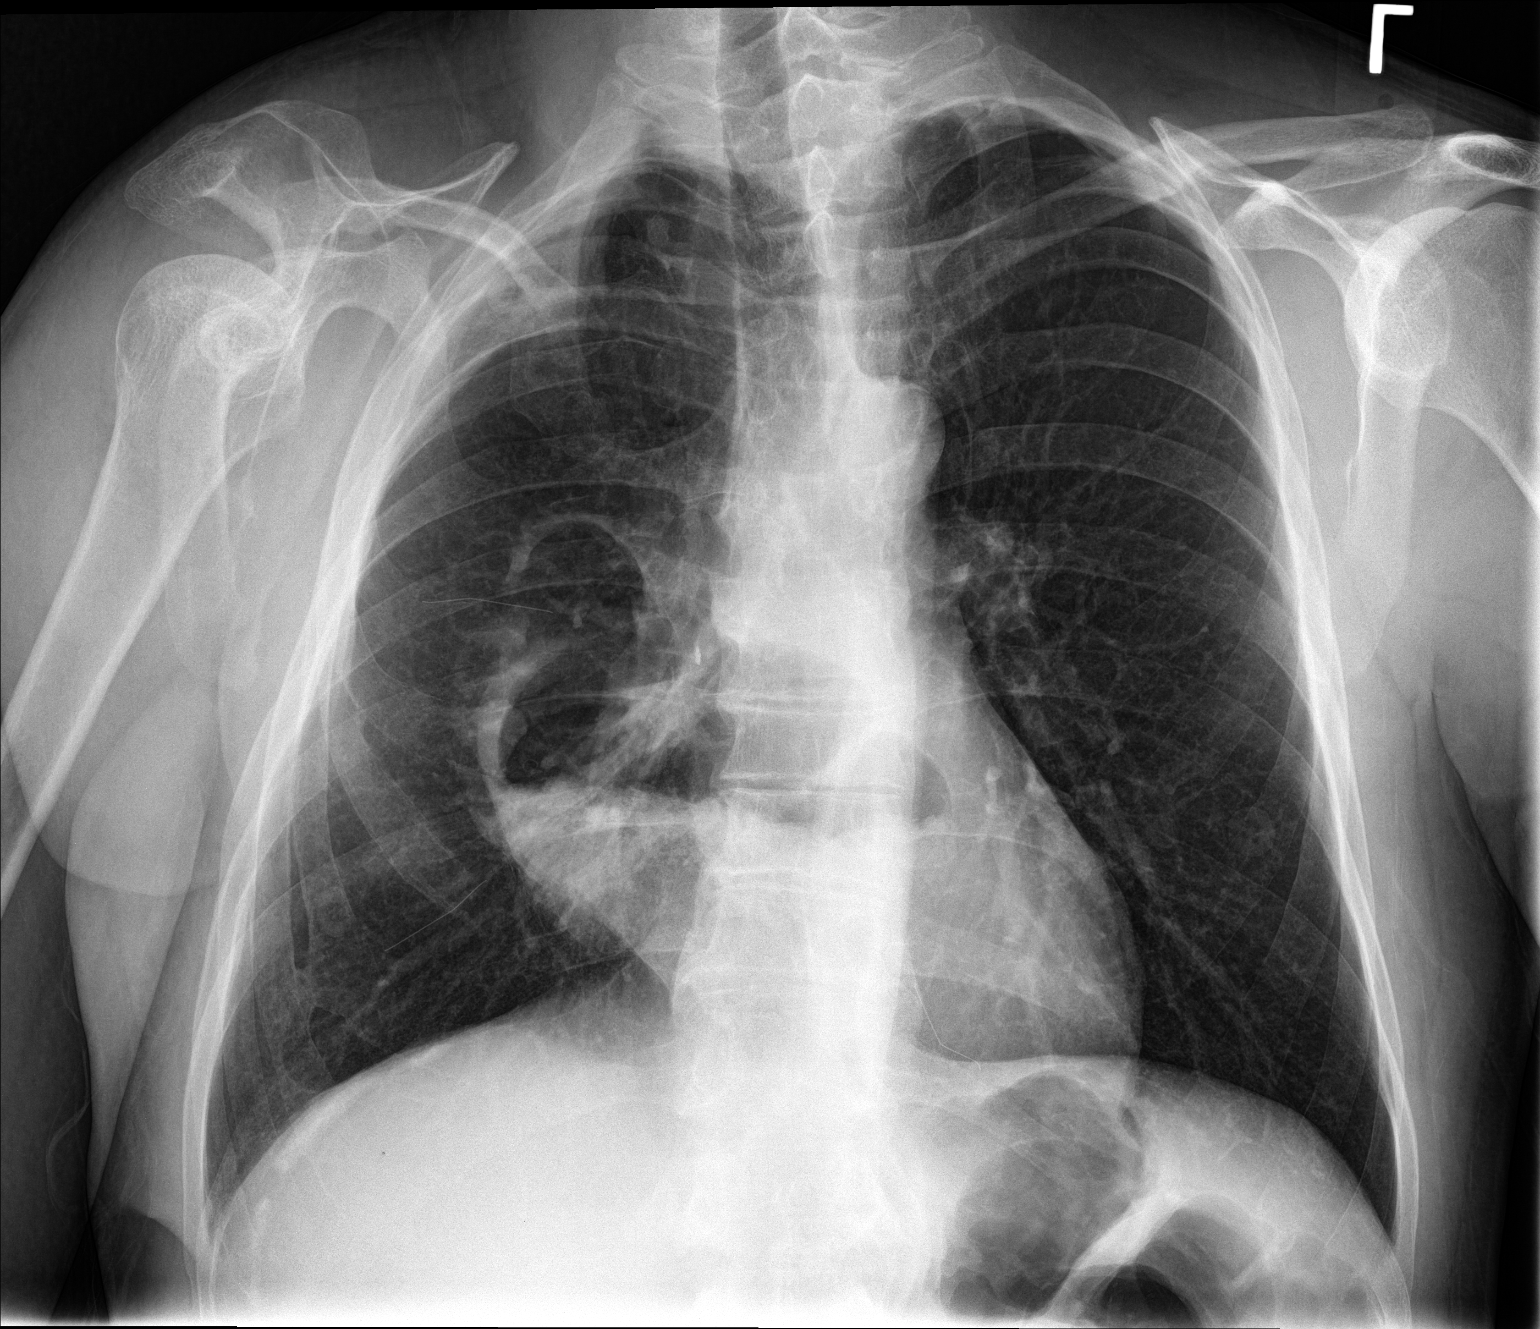

[chest lat]
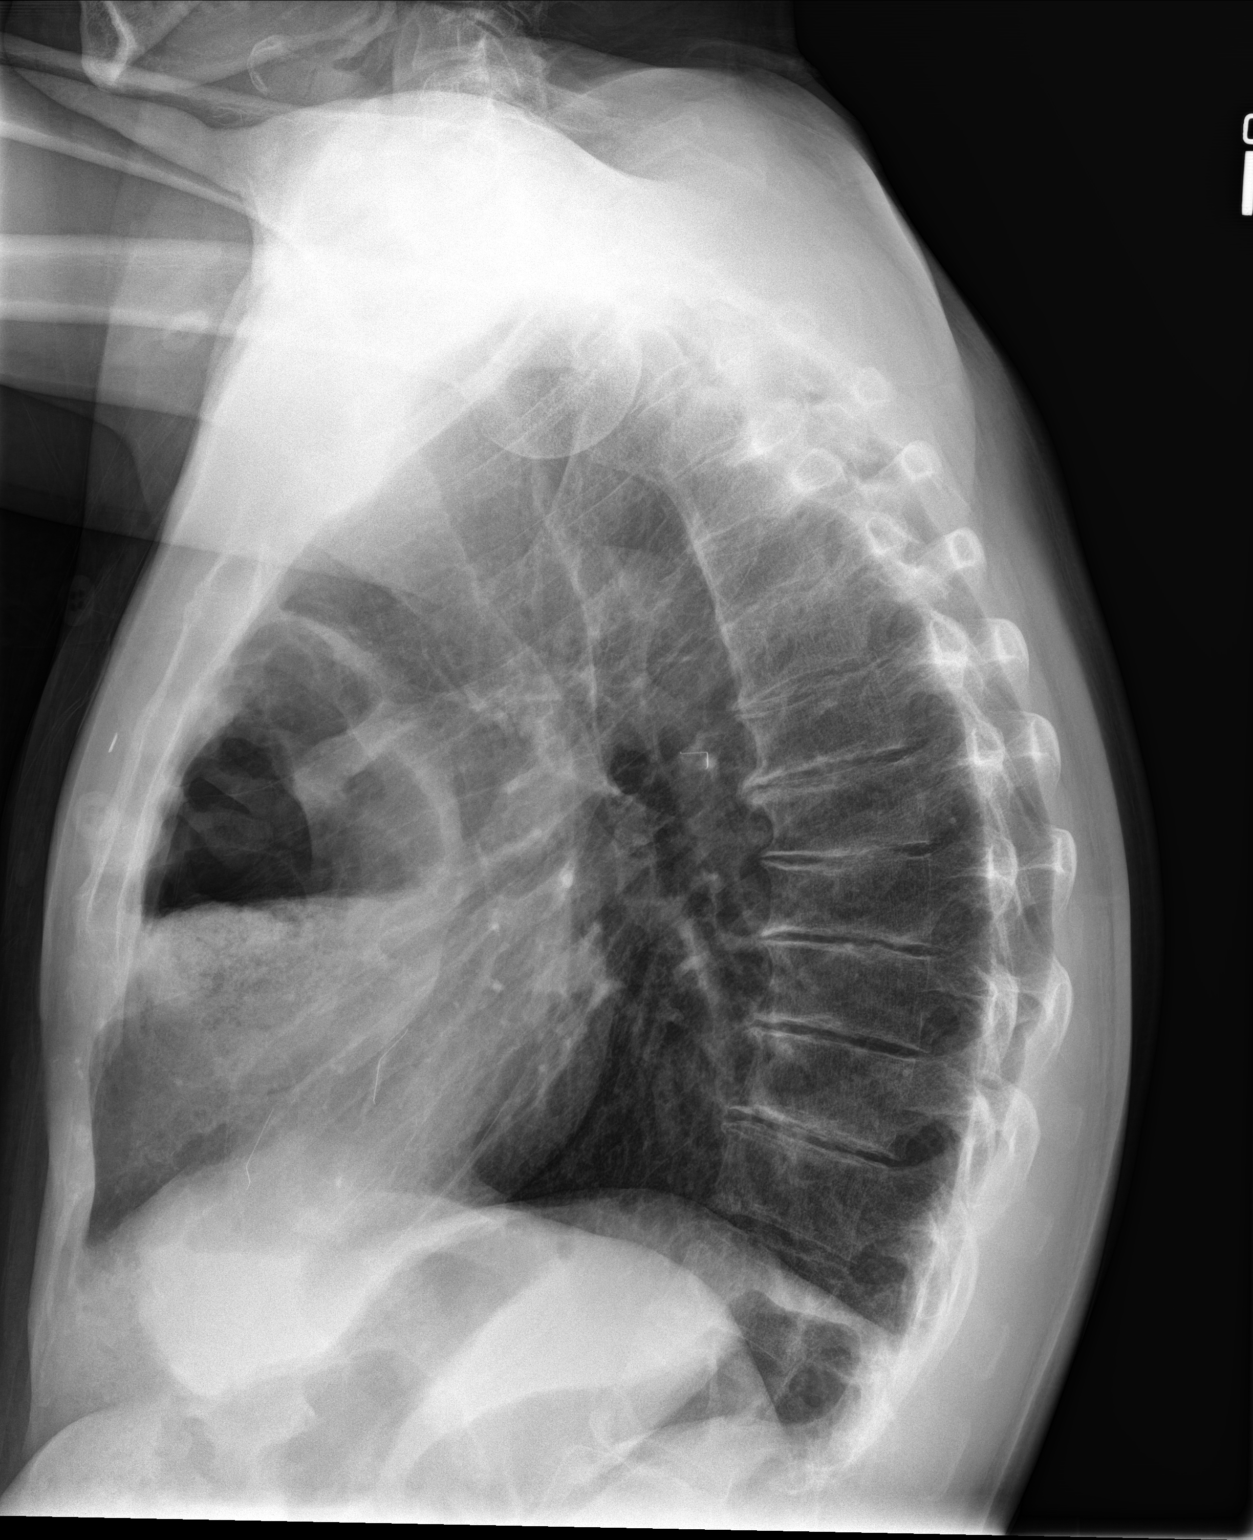

[2 of 2 positions shown; findings below may reference images not displayed]

FINDINGS: Sequela of prior colonic interposition in the lower mid and right
lung, associated with right basilar atelectasis again identified.
The heart, hila, mediastinum, lungs, and pleura are otherwise
unremarkable. No other acute abnormalities identified.
IMPRESSION: No acute abnormalities.

## 2019-04-13 IMAGING — CT CT ABD-PELV W/ CM
2 of 4 series · 17 of 46 positions shown, 19 images · IV contrast (Isovue)
Comparison: 12/30/2018.  CT 06/11/2016

CLINICAL DATA: Right upper quadrant pain

EXAM:
CT ABDOMEN AND PELVIS WITH CONTRAST
TECHNIQUE: Multidetector CT imaging of the abdomen and pelvis was performed
using the standard protocol following bolus administration of
intravenous contrast.
CONTRAST:  100mL YLAJ18-KCC IOPAMIDOL (YLAJ18-KCC) INJECTION 61%

[Series 2: axial st · axial · 0.73mm/px · z∈[-477,-92]mm · 14 of 87 slices shown, 16 images]
[im 5/87  soft-tissue]
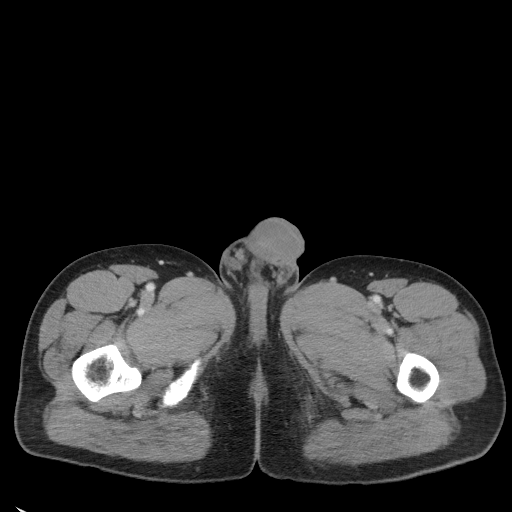
[im 5/87  bone]
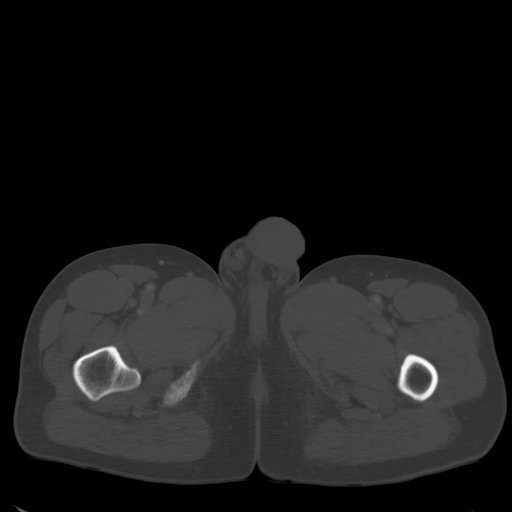
[im 13/87  soft-tissue]
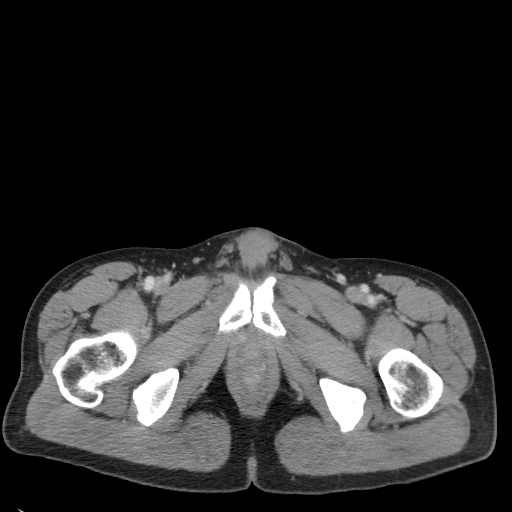
[im 17/87  soft-tissue]
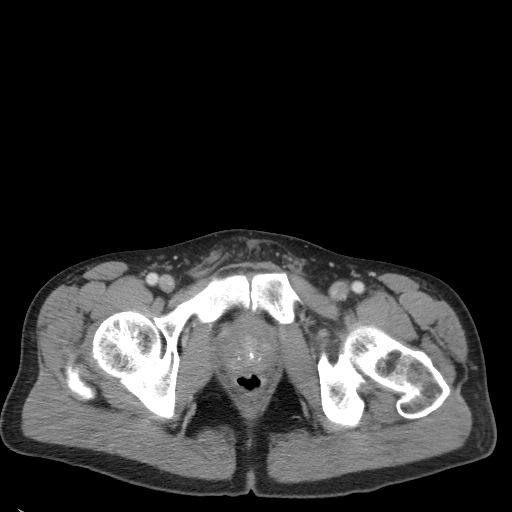
[im 25/87  soft-tissue]
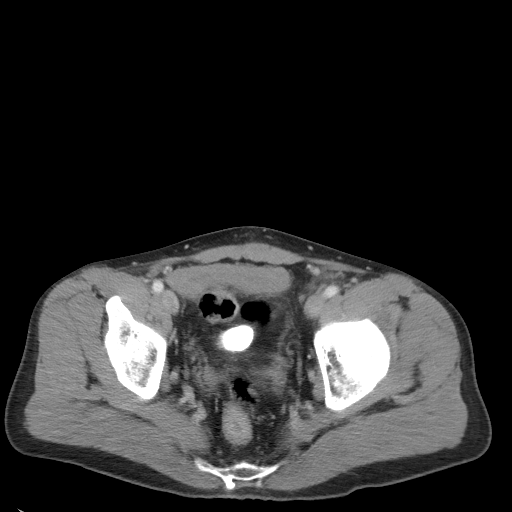
[im 29/87  soft-tissue]
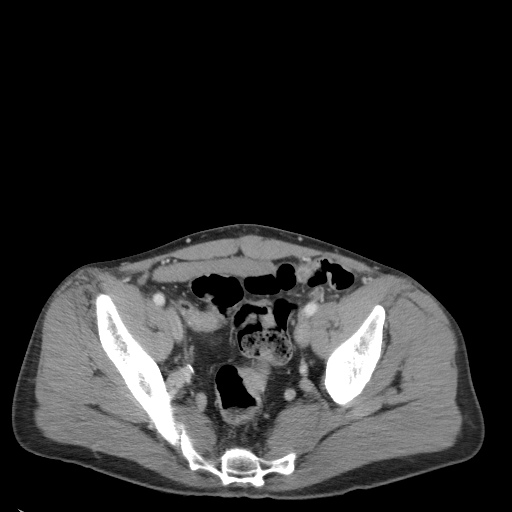
[im 33/87  soft-tissue]
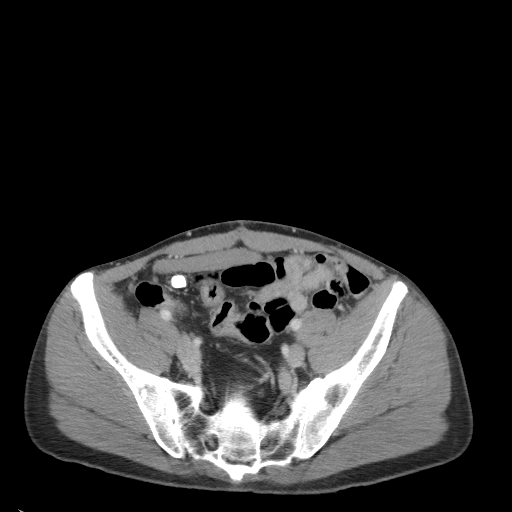
[im 41/87  soft-tissue]
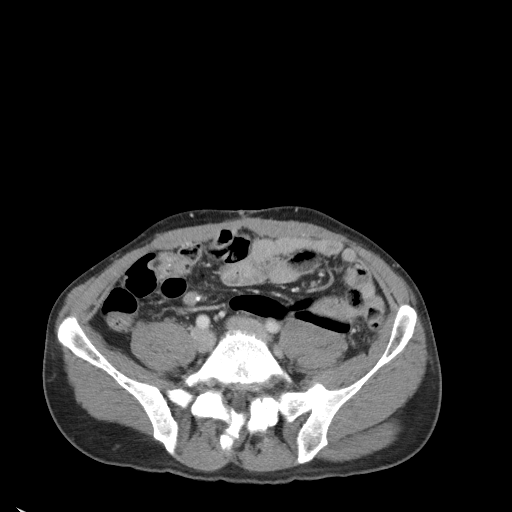
[im 46/87  soft-tissue]
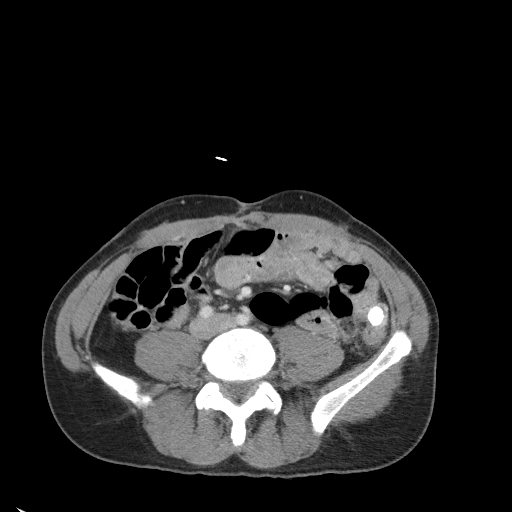
[im 54/87  soft-tissue]
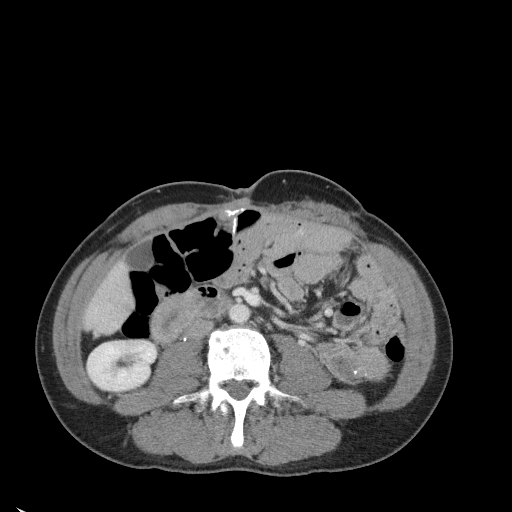
[im 54/87  bone]
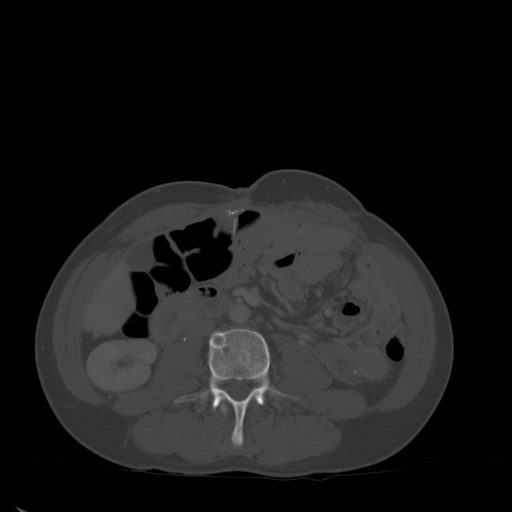
[im 58/87  soft-tissue]
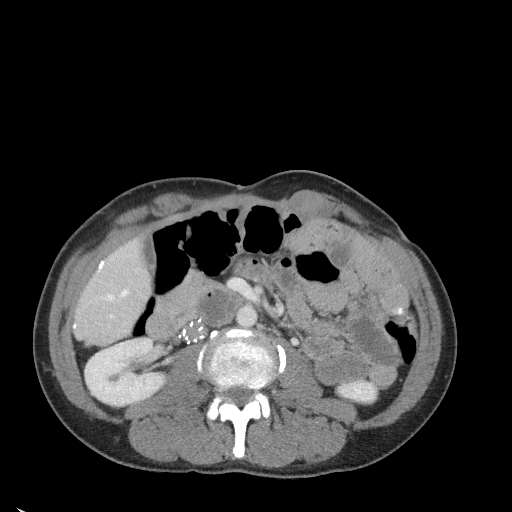
[im 66/87  soft-tissue]
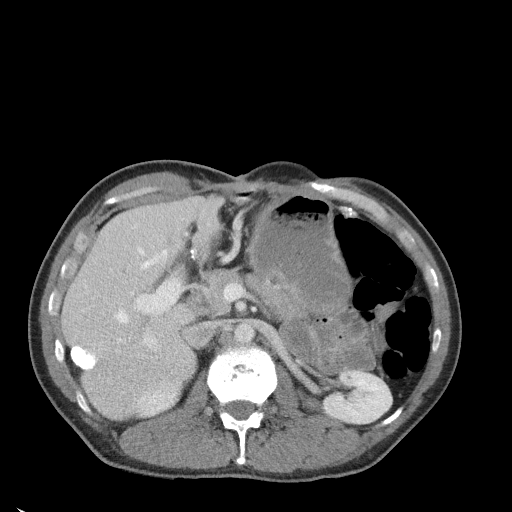
[im 70/87  soft-tissue]
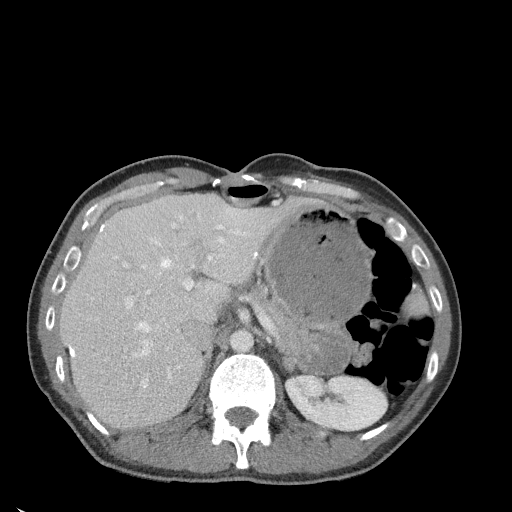
[im 74/87  soft-tissue]
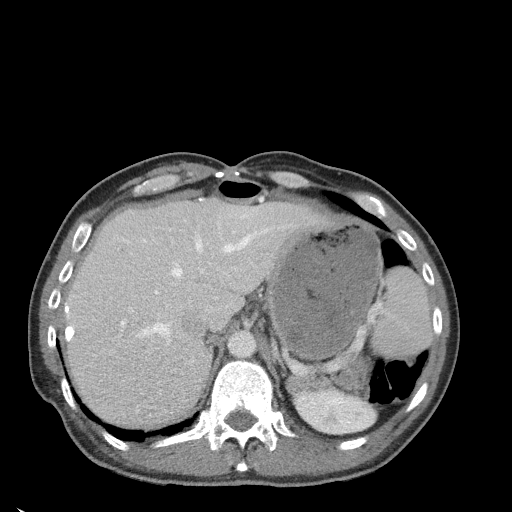
[im 82/87  soft-tissue]
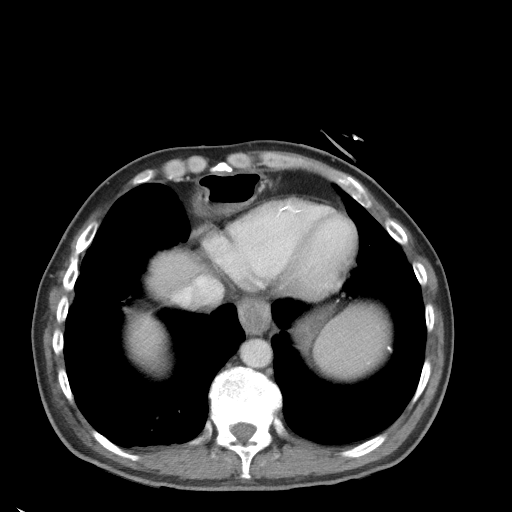

[Series 5: coronal st · coronal · 0.75mm/px · 3 of 79 slices shown]
[im 27/79  soft-tissue]
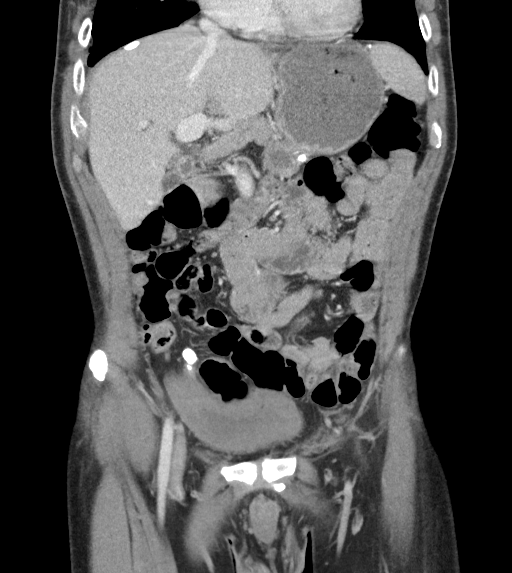
[im 35/79  soft-tissue]
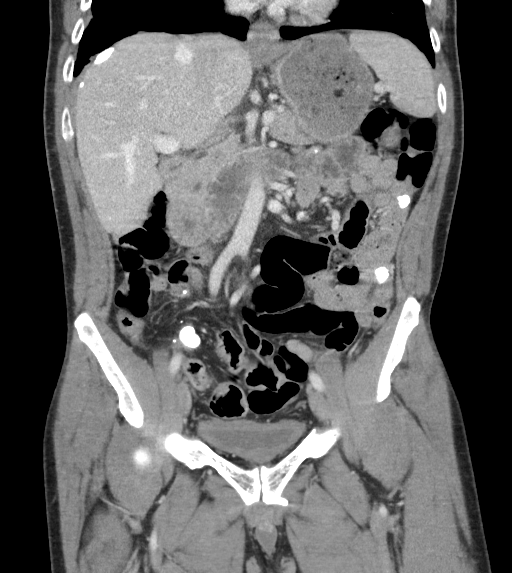
[im 44/79  soft-tissue]
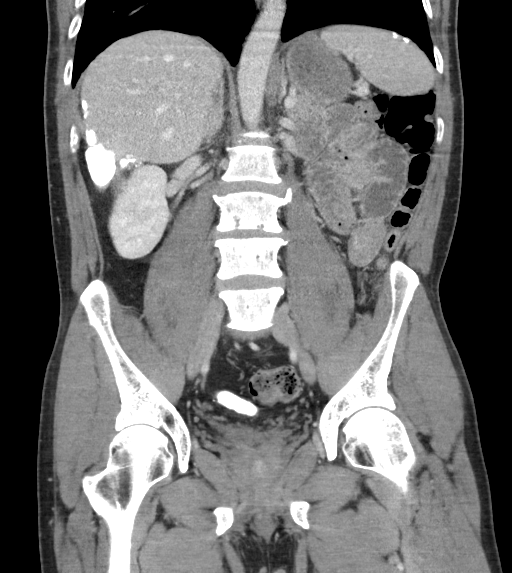

[17 of 46 positions shown; findings below may reference images not displayed]

FINDINGS: Lower chest: Colonic interposition graft is again noted in the
anterior lower chest and upper abdomen, unchanged. No acute
findings.

Hepatobiliary: Calcifications along the liver surface again noted,
stable. No focal hepatic abnormality or biliary ductal dilatation.
Gallbladder unremarkable.

Pancreas: No focal abnormality or ductal dilatation.

Spleen: No focal abnormality.  Normal size.

Adrenals/Urinary Tract: No adrenal abnormality. No focal renal
abnormality. No stones or hydronephrosis. Urinary bladder is
unremarkable.

Stomach/Bowel: Stomach, large and small bowel grossly unremarkable.

Vascular/Lymphatic: No evidence of aneurysm or adenopathy. IVC
filter in place, unchanged.

Reproductive: No visible focal abnormality.

Other: No free fluid or free air. Extensive peritoneal
calcifications are again noted throughout the abdomen and pelvis,
stable since prior study.

Musculoskeletal: No acute bony abnormality.
IMPRESSION: No acute findings in the abdomen or pelvis.

Calcifications throughout the abdomen and pelvis are stable since
prior study.

Colonic interposition graft partially imaged and grossly
unremarkable and unchanged.

## 2019-04-20 IMAGING — NM NM HEPATO W/GB/PHARM/[PERSON_NAME]
2 series · 12 of 12 positions shown · non-contrast
Comparison: None.

CLINICAL DATA: Right upper quadrant pain for a few weeks with food.
Nausea.

EXAM:
NUCLEAR MEDICINE HEPATOBILIARY IMAGING WITH GALLBLADDER EF
TECHNIQUE: Sequential images of the abdomen were obtained [DATE] minutes
following intravenous administration of radiopharmaceutical. After
oral ingestion of Ensure, gallbladder ejection fraction was
determined. At 60 min, normal ejection fraction is greater than 33%.
RADIOPHARMACEUTICALS:  5.48 mCi Vc-77m  Choletec IV

[Series 1: biliary · 3.25mm/px · 6 of 60 frames shown]
[frame 6/60]
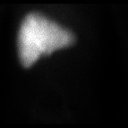
[frame 16/60]
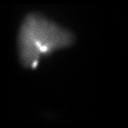
[frame 26/60]
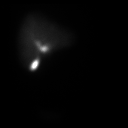
[frame 36/60]
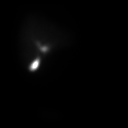
[frame 46/60]
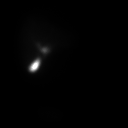
[frame 56/60]
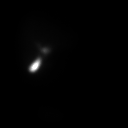

[Series 2: gbef · 3.25mm/px · 6 of 60 frames shown]
[frame 6/60]
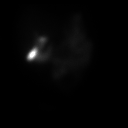
[frame 16/60]
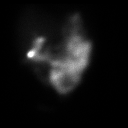
[frame 26/60]
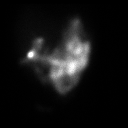
[frame 36/60]
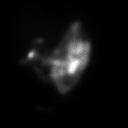
[frame 46/60]
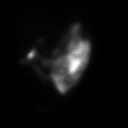
[frame 56/60]
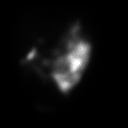

[12 of 12 positions shown; findings below may reference images not displayed]

FINDINGS: Prompt uptake and biliary excretion of activity by the liver is
seen. Gallbladder activity is visualized, consistent with patency of
cystic duct. Biliary activity passes into small bowel, consistent
with patent common bile duct.

Calculated gallbladder ejection fraction is 93%. (Normal gallbladder
ejection fraction with Ensure is greater than 33%.)
IMPRESSION: Normal study.  The gallbladder ejection fraction is 93%.

## 2019-04-22 IMAGING — CT CT ANGIO CHEST
2 of 6 series · 18 of 46 positions shown · IV contrast (Isovue)
Comparison: Same day chest radiograph, CT chest, 06/15/2009

CLINICAL DATA: Chest pain

EXAM:
CT ANGIOGRAPHY CHEST WITH CONTRAST
TECHNIQUE: Multidetector CT imaging of the chest was performed using the
standard protocol during bolus administration of intravenous
contrast. Multiplanar CT image reconstructions and MIPs were
obtained to evaluate the vascular anatomy.
CONTRAST:  100mL Z4BOWP-625 IOPAMIDOL (Z4BOWP-625) INJECTION 76%

[Series 6: thins · axial · 0.60mm/px · z∈[+1316,+1553]mm · 15 of 261 slices shown]
[im 12/261  lung]
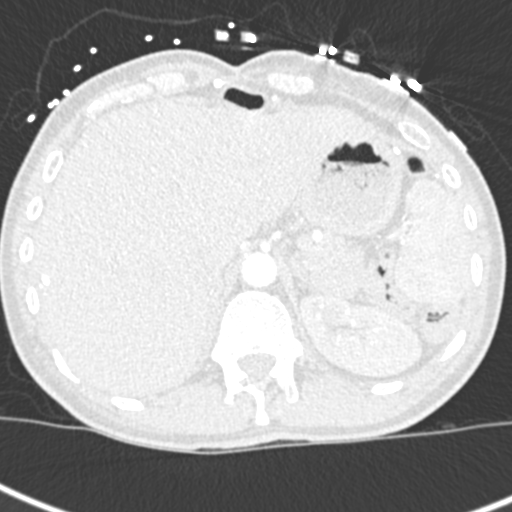
[im 34/261  soft-tissue]
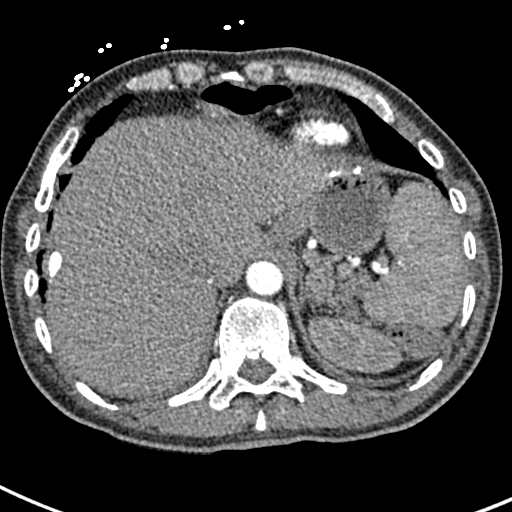
[im 46/261  lung]
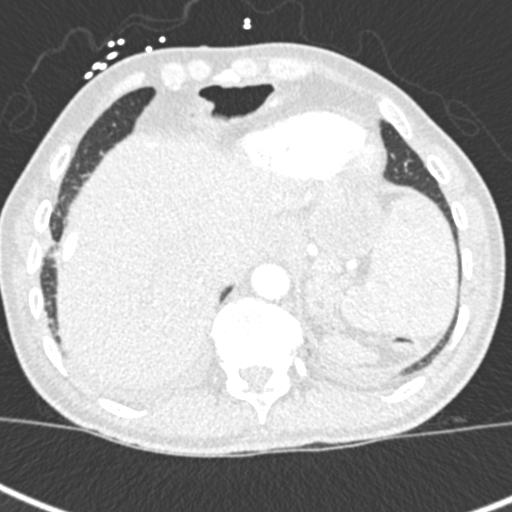
[im 68/261  soft-tissue]
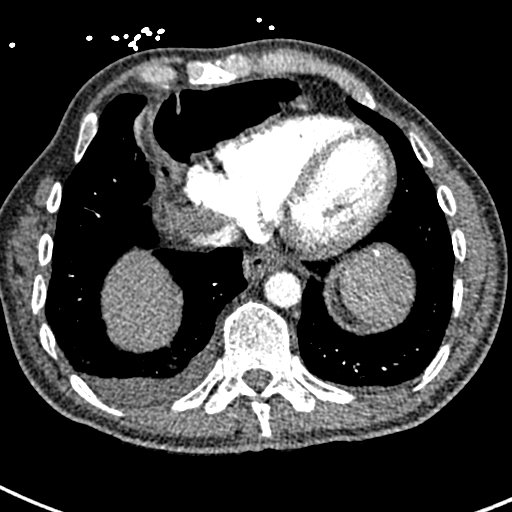
[im 80/261  lung]
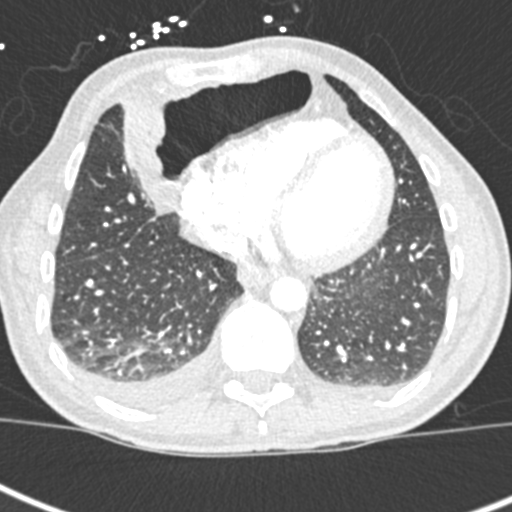
[im 102/261  soft-tissue]
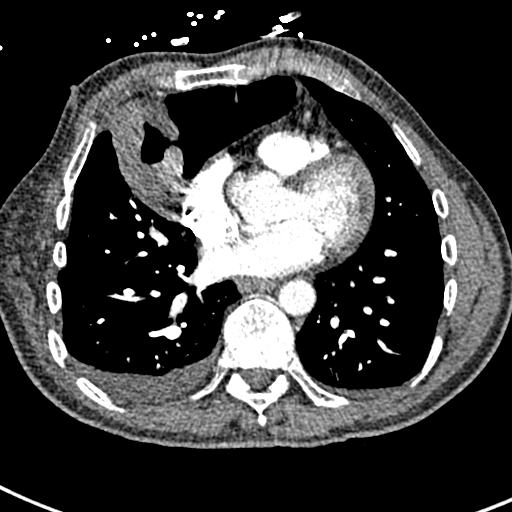
[im 114/261  lung]
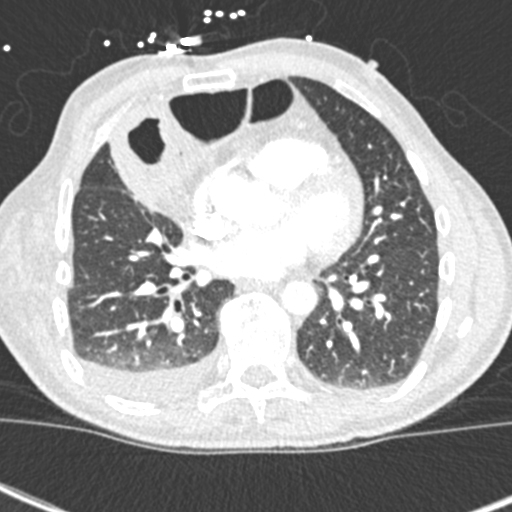
[im 136/261  soft-tissue]
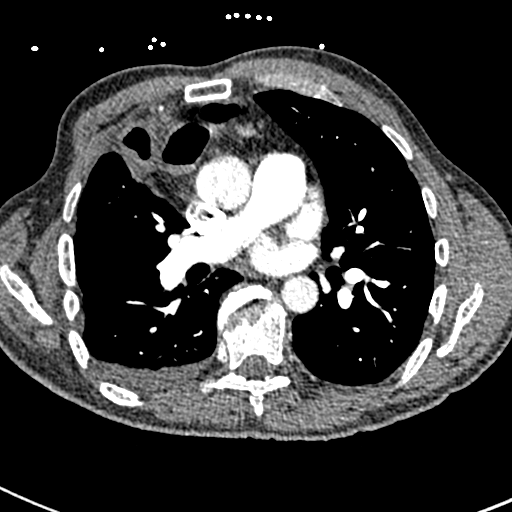
[im 147/261  lung]
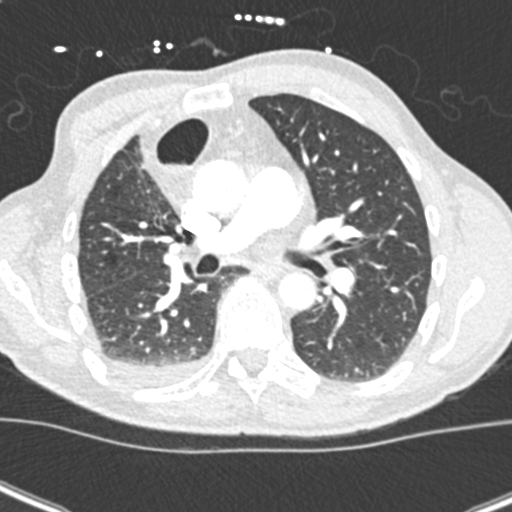
[im 159/261  soft-tissue]
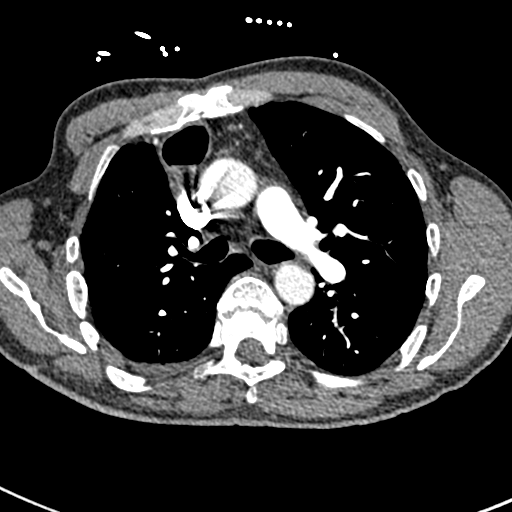
[im 181/261  lung]
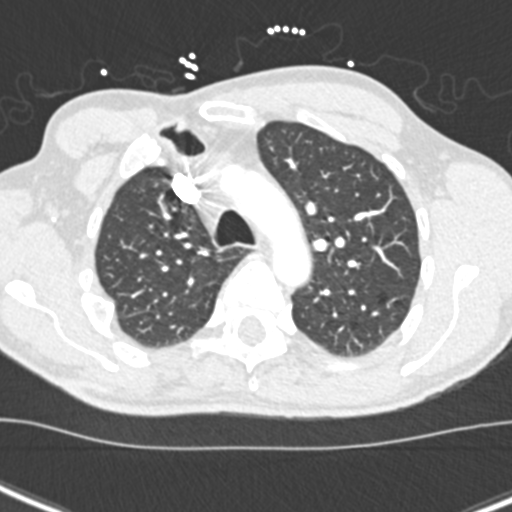
[im 193/261  soft-tissue]
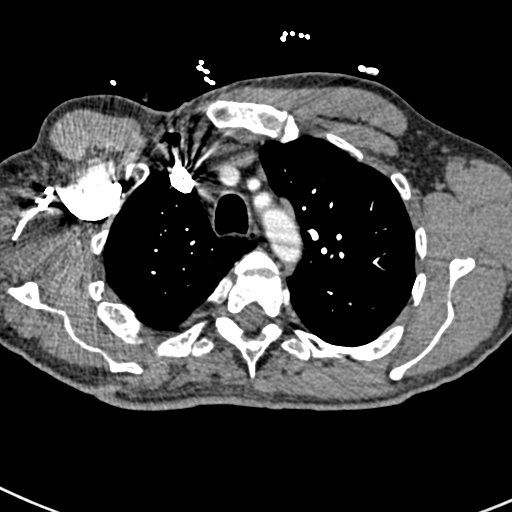
[im 215/261  lung]
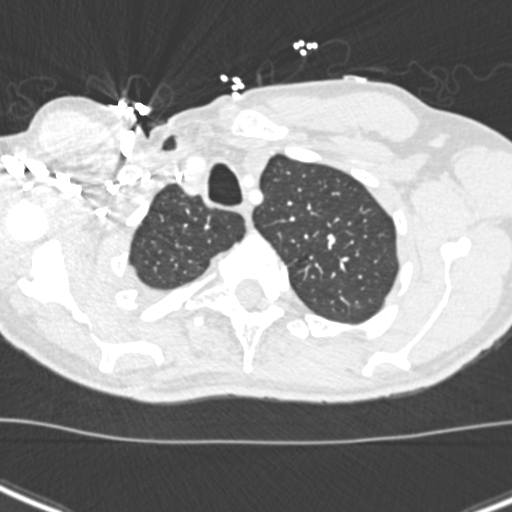
[im 227/261  soft-tissue]
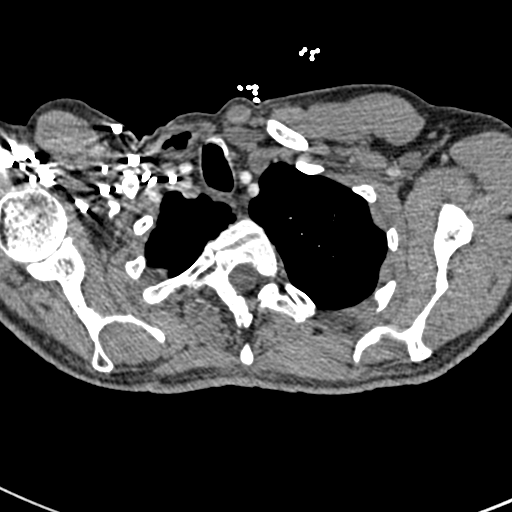
[im 249/261  lung]
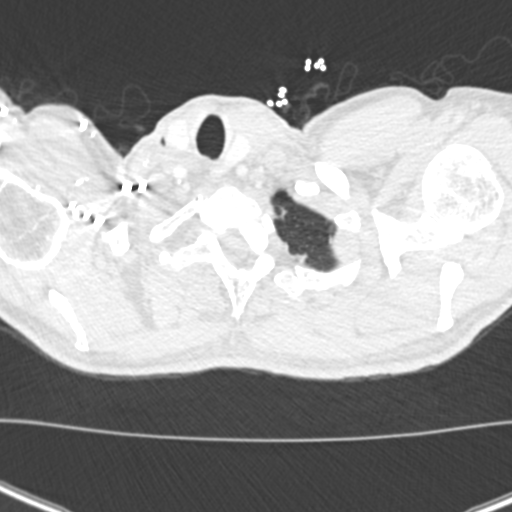

[Series 8: coronal mpr · coronal · 0.54mm/px · 3 of 151 slices shown]
[im 38/151  soft-tissue]
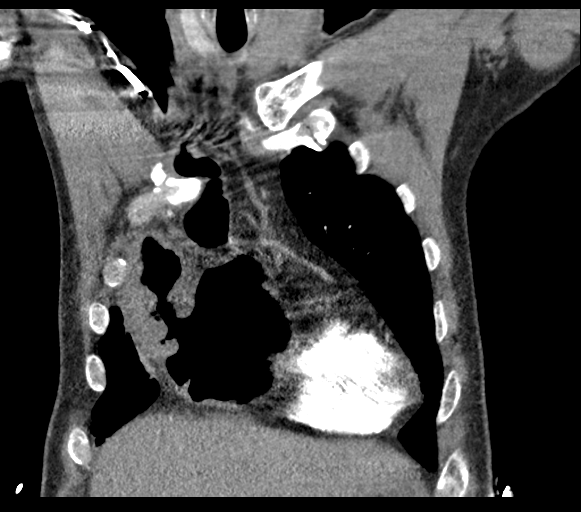
[im 76/151  soft-tissue]
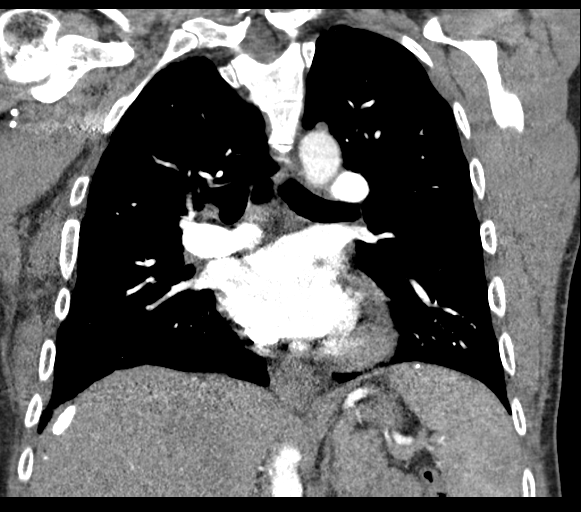
[im 113/151  soft-tissue]
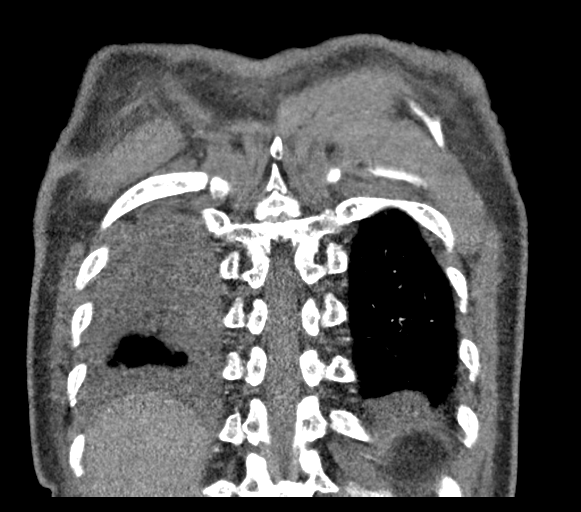

[18 of 46 positions shown; findings below may reference images not displayed]

FINDINGS: Cardiovascular: Satisfactory opacification of the pulmonary arteries
to the segmental level. No evidence of pulmonary embolism. Normal
heart size. No pericardial effusion.

Mediastinum/Nodes: Redemonstrated findings of colonic interposition,
partially imaged through the chest. There is masslike thickening at
the right aspect of the mid to lower portion of the graft (series 5,
image 51, series 10, image 41). No enlarged mediastinal, hilar, or
axillary lymph nodes. Thyroid gland, trachea, and esophagus
demonstrate no significant findings.

Lungs/Pleura: Lungs are clear. Small, right greater than left
pleural effusions.

Upper Abdomen: No acute abnormality.

Musculoskeletal: No chest wall abnormality. No acute or significant
osseous findings. Chronic fracture deformities of the right ribs.

Review of the MIP images confirms the above findings.
IMPRESSION: 1.  Negative examination for pulmonary embolism.

2. Small, right greater than left pleural effusions, with associated
atelectasis or consolidation.

3. Redemonstrated findings of colonic interposition, partially
imaged through the chest. There is masslike thickening at the right
aspect of the mid to lower portion of the graft (series 5, image 51,
series 10, image 41). This is of uncertain significance the although
concerning for non-specific inflammation or alternately malignancy.
Consider endoscopic evaluation.

## 2019-04-22 IMAGING — DX DG CHEST 2V
2 series · 2 of 2 positions shown · non-contrast
Comparison: 01/16/2019

CLINICAL DATA: Right-sided chest pain

EXAM:
CHEST - 2 VIEW

[chest pa]
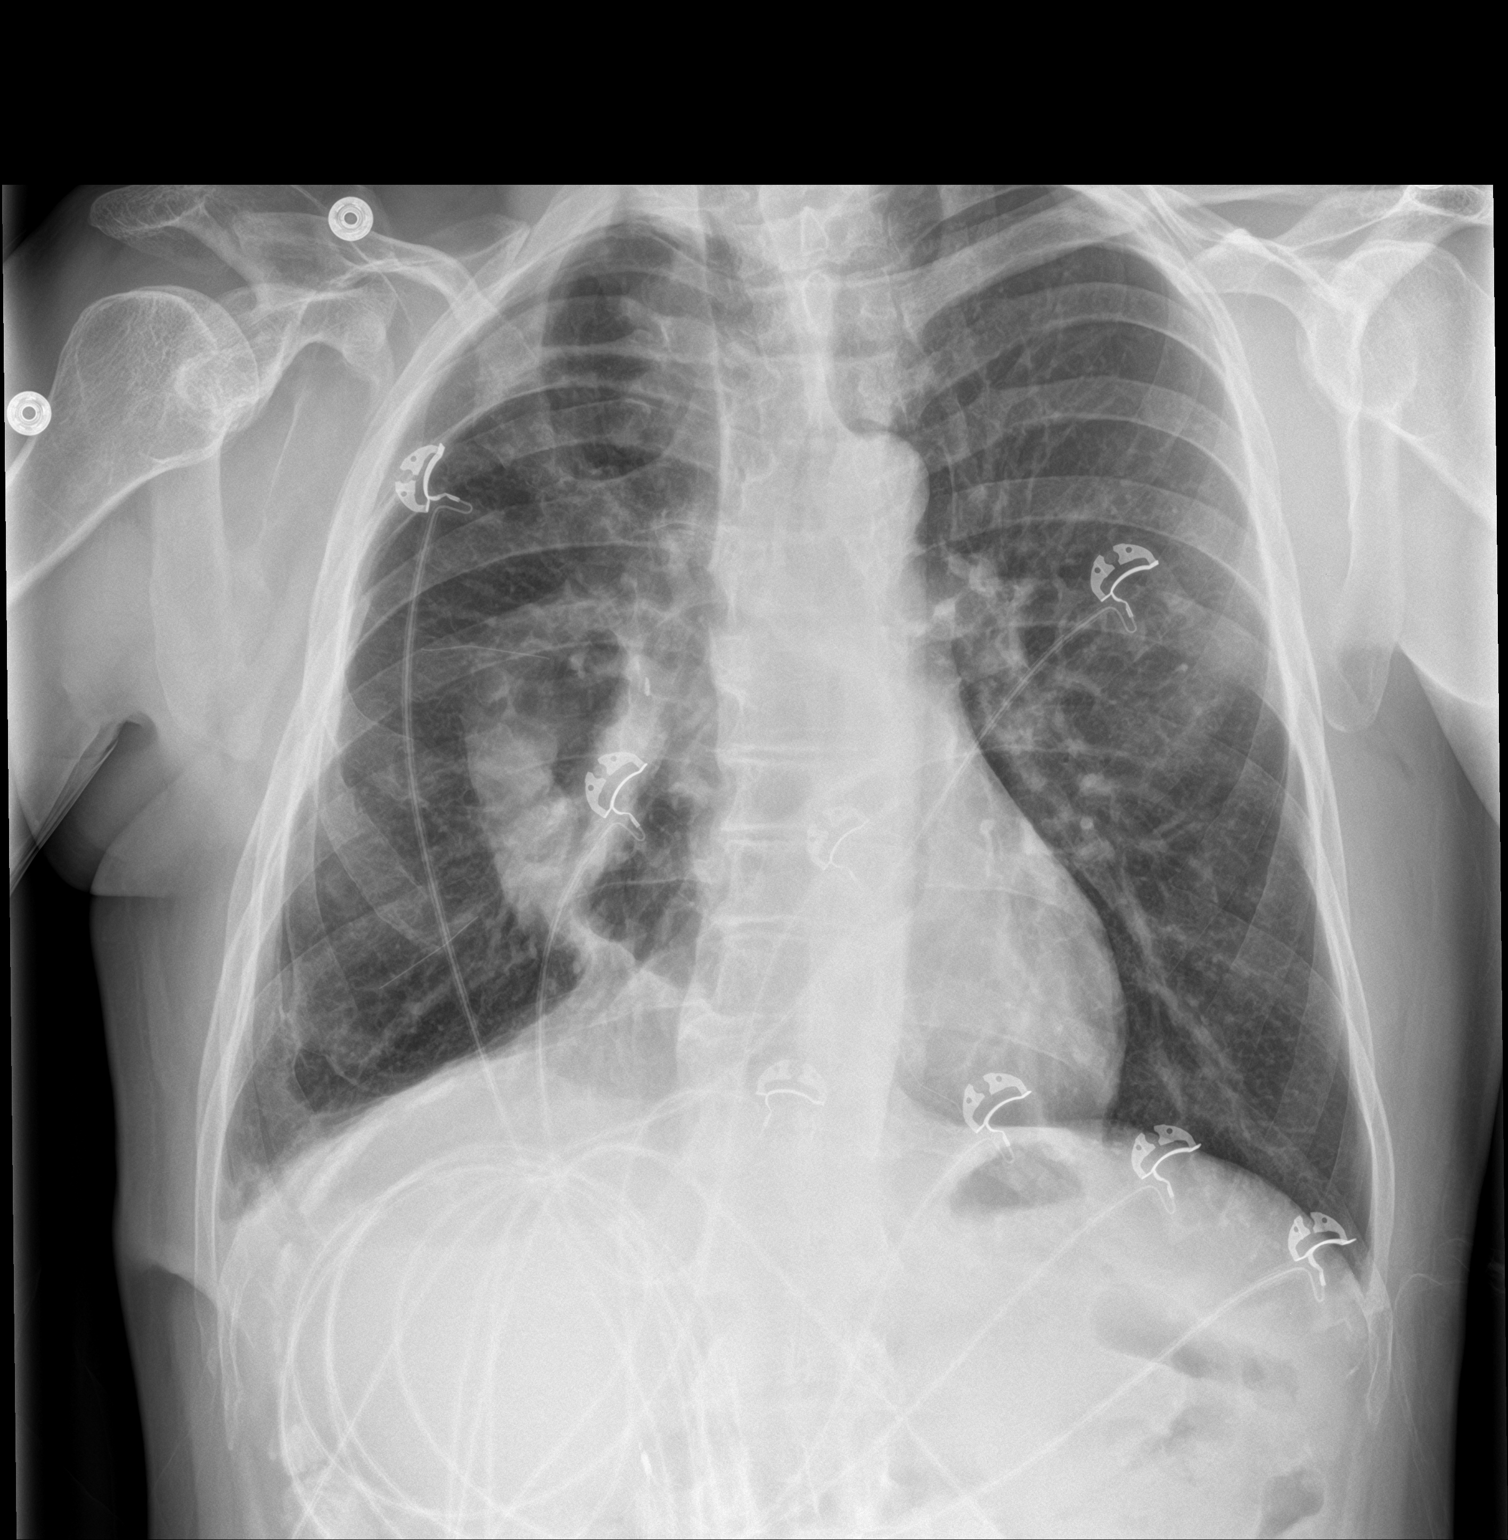

[chest lat]
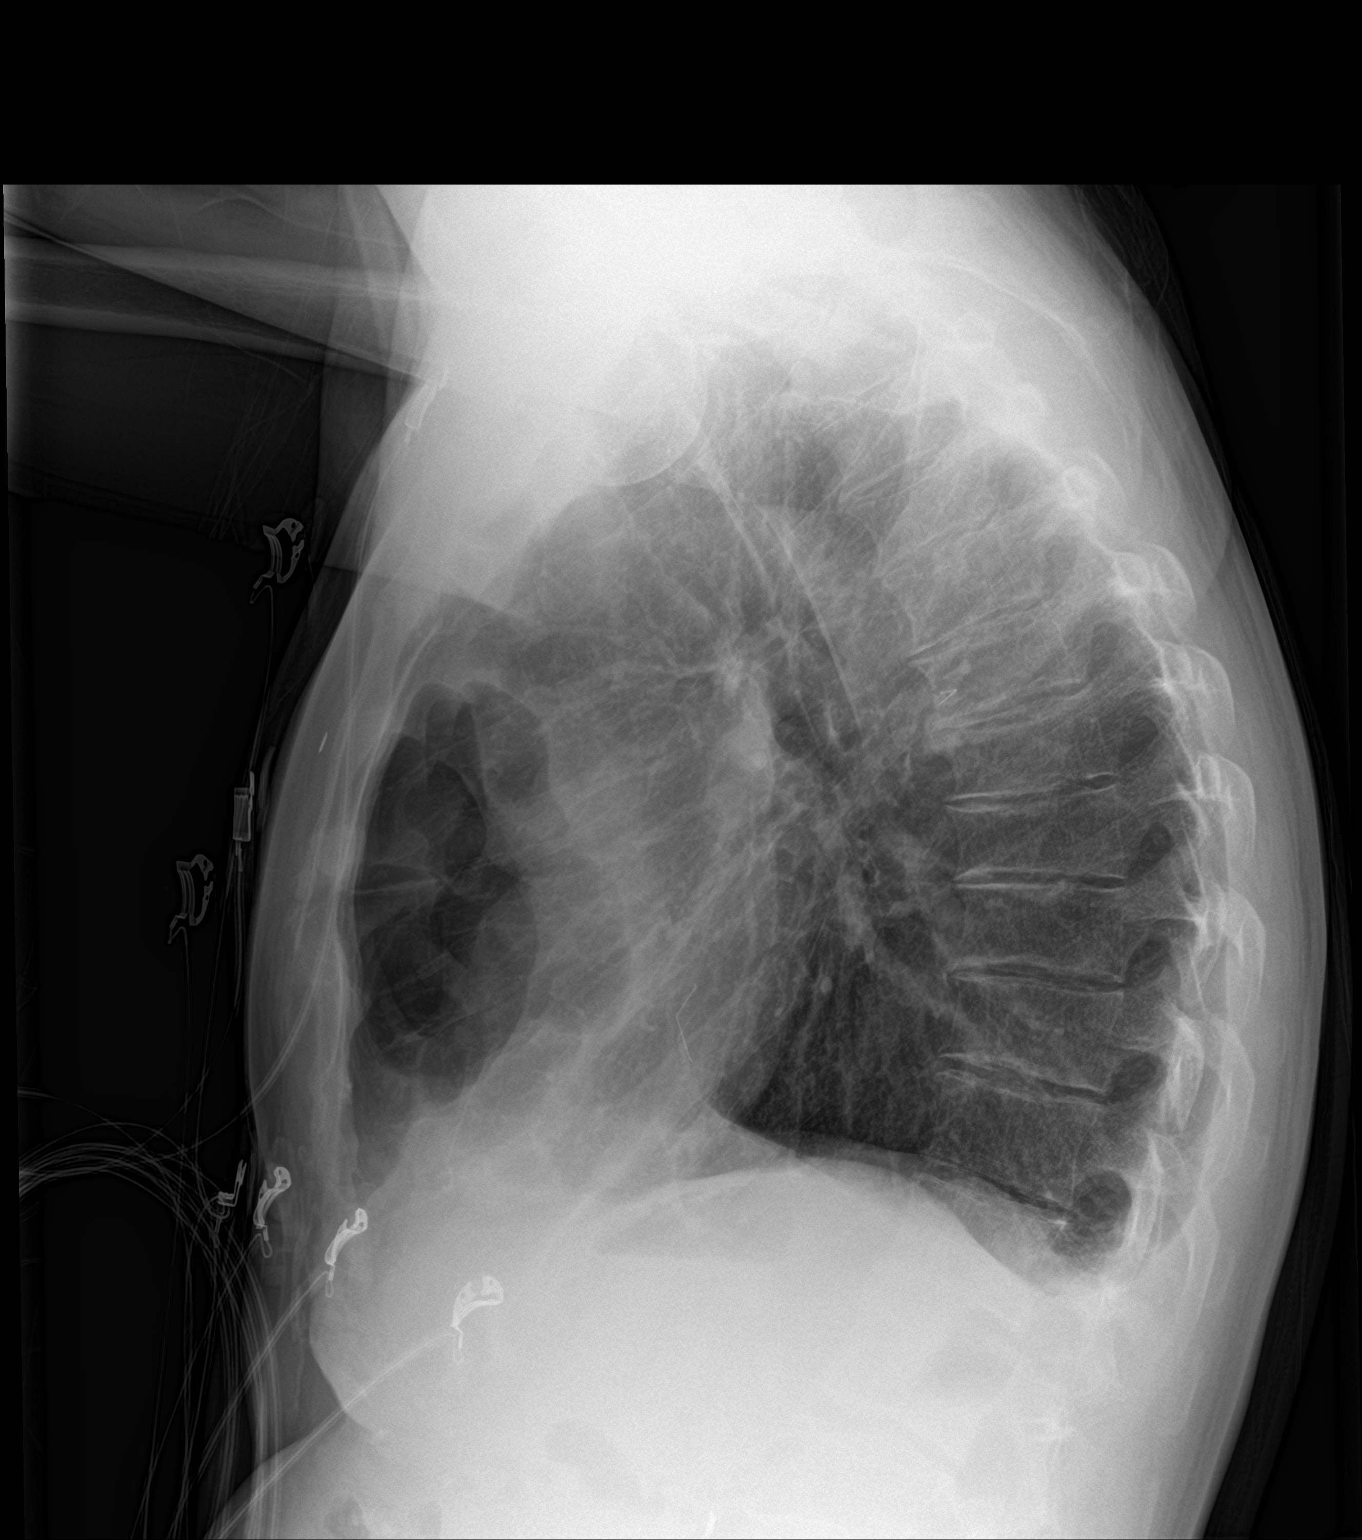

[2 of 2 positions shown; findings below may reference images not displayed]

FINDINGS: The heart size and mediastinal contours are within normal limits.
Redemonstrated colonic interposition graft in unchanged
configuration. Both lungs are clear. The visualized skeletal
structures are unremarkable.
IMPRESSION: No acute abnormality of the lungs. Redemonstrated colonic
interposition graft in unchanged configuration.

## 2019-05-08 IMAGING — RF DG UGI W/ HIGH DENSITY W/O KUB
12 of 16 series · 13 of 24 positions shown · non-contrast
Comparison: CT chest 01/25/2019

Correlation: EGD report of 01/26/2019

CLINICAL DATA: RIGHT side chest pain, anastomotic ulcer, congenital
esophageal abnormality post resection and colon interposition

EXAM:
UPPER GI SERIES WITH KUB
TECHNIQUE: After obtaining a scout radiograph a routine upper GI series was
performed using thin and high density barium.
FLUOROSCOPY TIME:  Fluoroscopy Time:  4 minutes 48 seconds
Radiation Exposure Index (if provided by the fluoroscopic device):
91.9 mGy
Number of Acquired Spot Images: 1 plus multiple fluoroscopic screen
captures

[Series 1: t abdomen supine · 0.15mm/px · 1 of 1 slices shown]
[im 1/1]
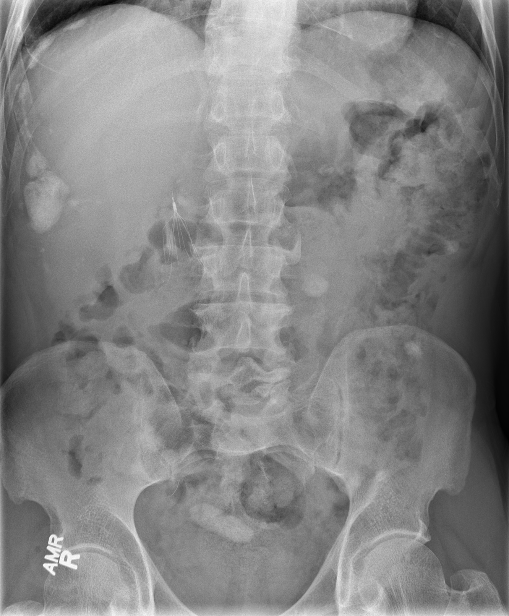

[Series 3: cp_standard · 0.19mm/px · 1 of 49 frames shown (1 of 11)]
[frame 25/49]
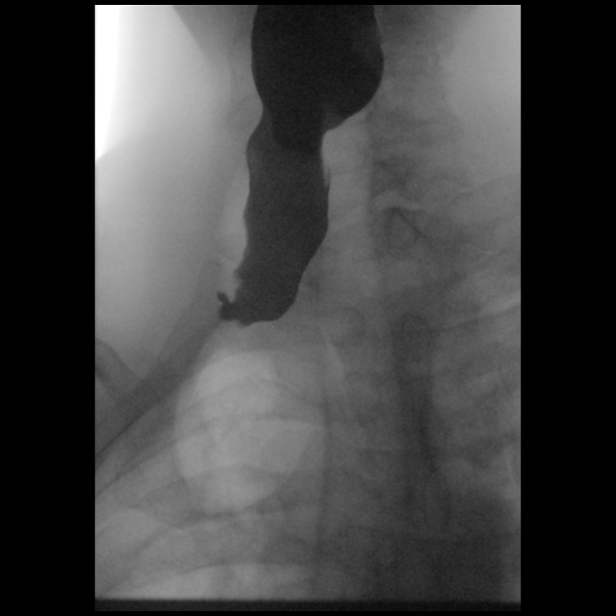

[Series 4: cp_standard · 0.19mm/px · 1 of 31 frames shown (2 of 11)]
[frame 16/31]
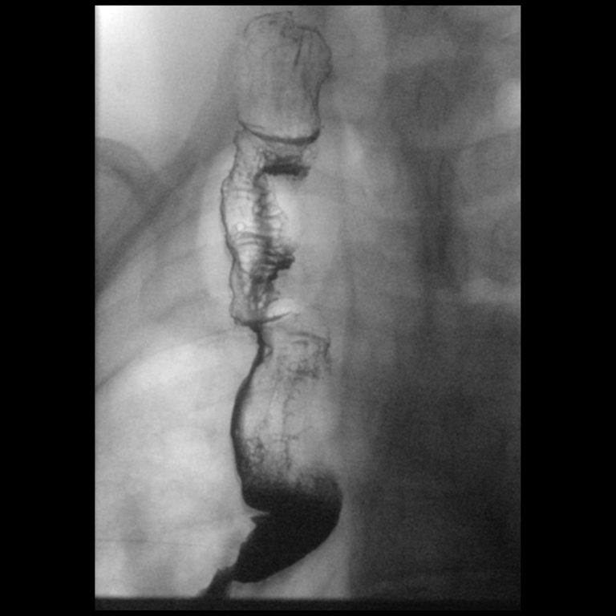

[Series 5: cp_standard · 0.19mm/px · 1 of 92 frames shown (3 of 11)]
[frame 47/92]
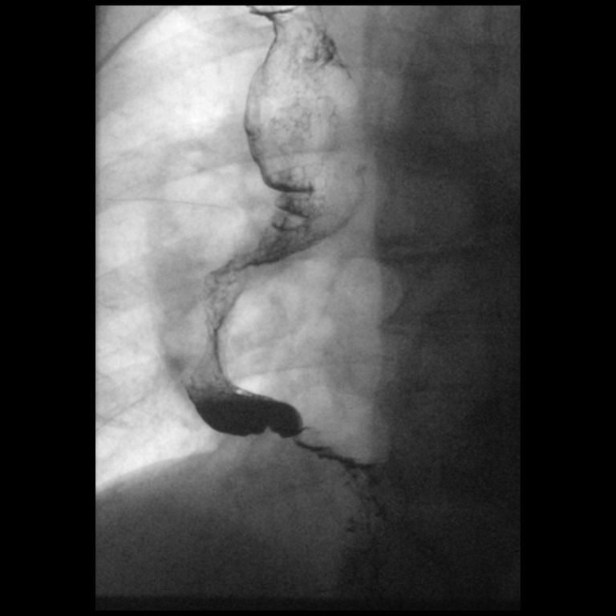

[Series 6: cp_standard · 0.19mm/px · 1 of 25 frames shown (4 of 11)]
[frame 4/25]
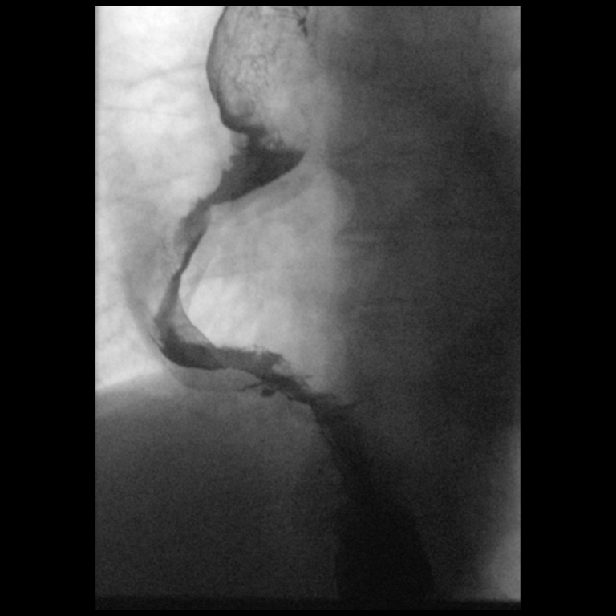

[Series 7: cp_standard · 0.19mm/px · 1 of 83 frames shown (5 of 11)]
[frame 42/83]
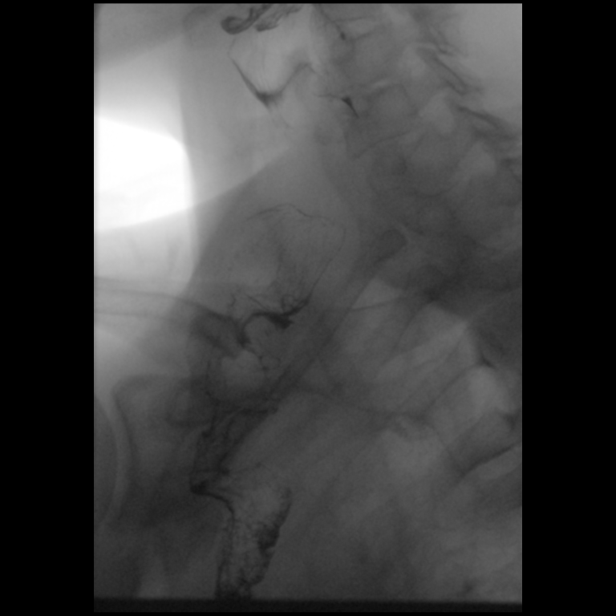

[Series 8: cp_standard · 0.19mm/px · 2 of 16 frames shown (6 of 11)]
[frame 9/16]
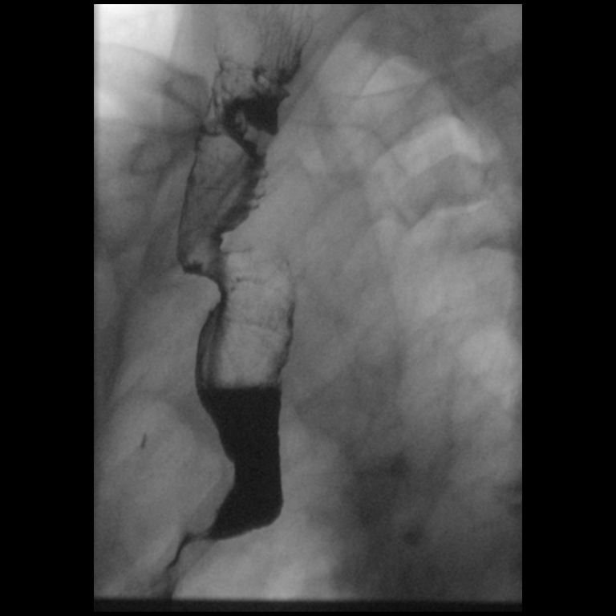
[frame 15/16]
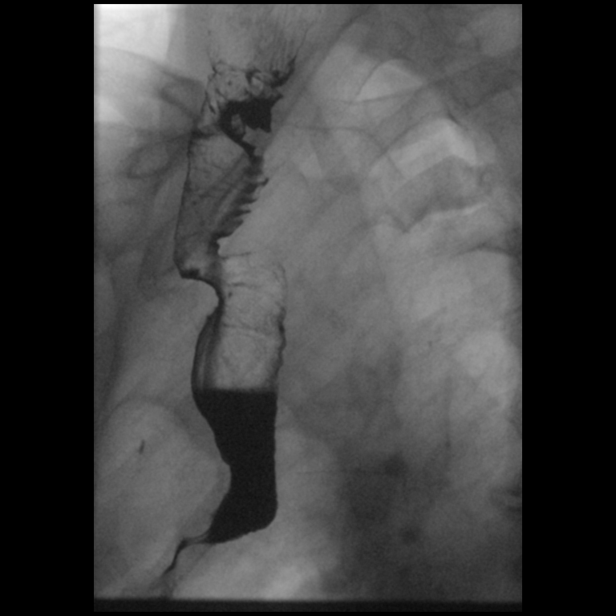

[Series 12: cp_standard · 0.18mm/px · 1 of 1 slices shown (7 of 11)]
[im 1/1]
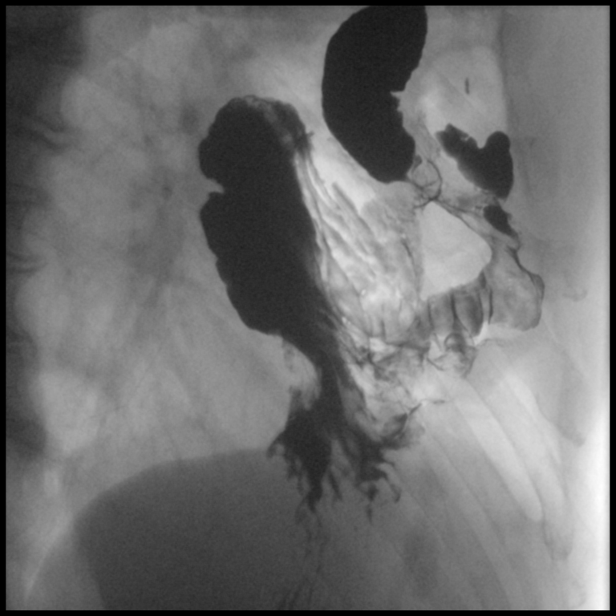

[Series 16: cp_standard · 0.19mm/px · 1 of 1 slices shown (8 of 11)]
[im 1/1]
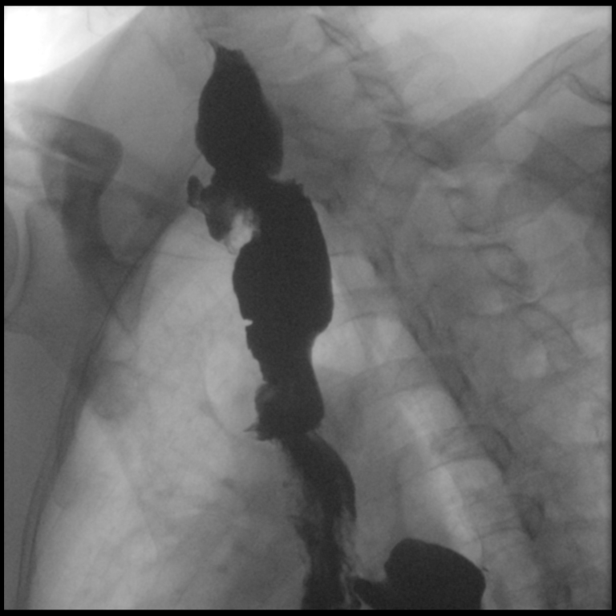

[Series 17: cp_standard · 0.19mm/px · 1 of 68 frames shown (9 of 11)]
[frame 35/68]
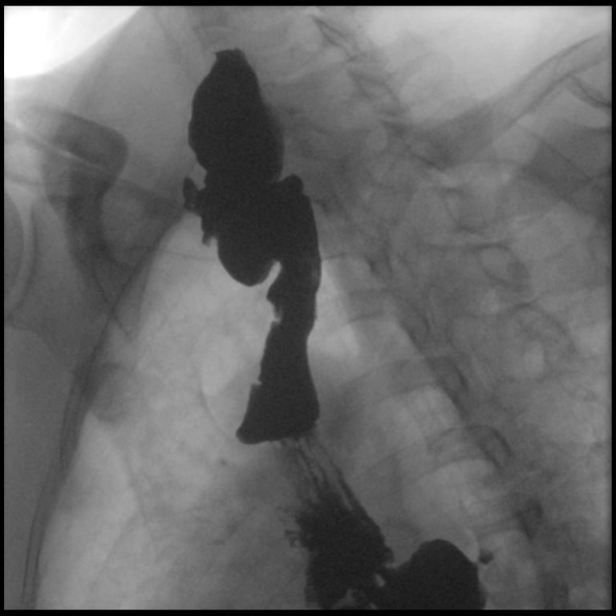

[Series 20: cp_standard · 0.19mm/px · 1 of 1 slices shown (10 of 11)]
[im 1/1]
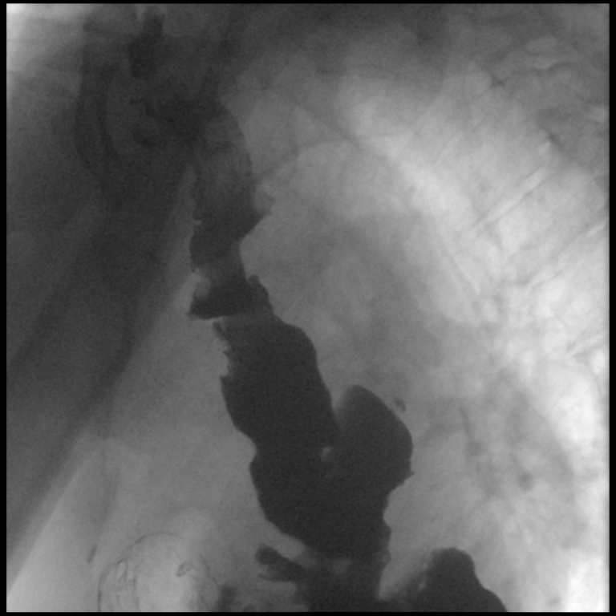

[Series 24: cp_standard · 0.19mm/px · 1 of 1 slices shown (11 of 11)]
[im 1/1]
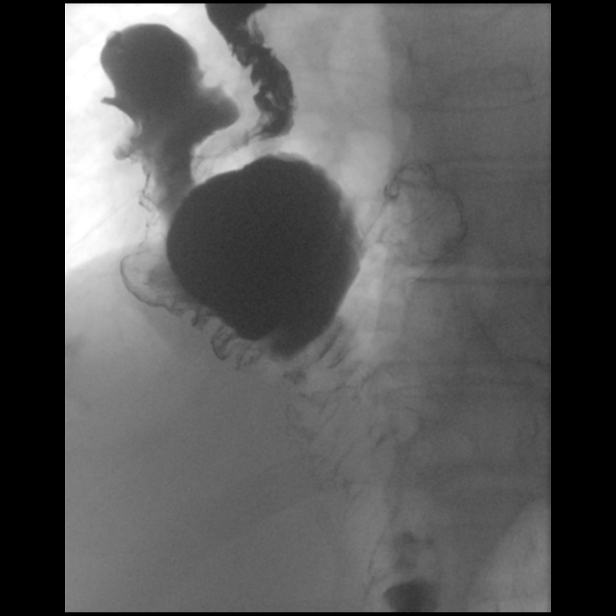

[13 of 24 positions shown; findings below may reference images not displayed]

FINDINGS: Cervical esophagus mildly distended otherwise unremarkable.

Proximal esophagocolonic anastomosis.

Focal outpouching of the contrast column LEFT laterally just beyond
the anastomosis, suspect corresponding to ulcer described on
endoscopy.

Limited motility of the proximal and mid portions of the colon
interposition.

A segment of slightly decreased colonic distensibility is identified
at the upper thoracic level, with a questionable area of
umbilication proximal LEFT laterally, could represent a small site
of ulceration though no definite corresponding lesion was seen at
endoscopy.

Two small outpouchings of contrast are additionally identified at
the proximal colon interposition, suspect colonic diverticula.

Distended midportion of the colon interposition though haustral
folds are still identified.

Several images suggest presence of RIGHT lateral wall thickening of
the mid portion of the colon interposition, corresponding to a
thickened area on CT chest exam, though no abnormalities were seen
at this site by endoscopy.

Distal colon interposition is anteriorly located within the RIGHT
hemithorax and appears somewhat stretched status elongated but
nonobstructive, extending from the thorax to the anterior antral
wall of the stomach where a patent anastomosis is seen.

No additional ulceration identified.

Good gravity dependent clearance of contrast from the colon
interposition into the stomach.

Stomach distends normally with normal rugal fold pattern.

Reflux of contrast into a short segment of residual native distal
esophagus is seen.

No gastric outlet obstruction.

Duodenal bulb and sweep normal appearance.

IVC filter noted.
IMPRESSION: Colon interposition extends from cervical esophagus to anterior
gastric antral wall without obstruction.

Probable ulcer at proximal colon interposition, just distal to the
esophago colonic anastomosis.

Several probable colonic diverticula.

Questionable wall thickening of the mid colon interposition along
the RIGHT lateral aspect, corresponding to thickened colonic wall on
CT exam, though significance is uncertain as no abnormalities were
identified on interval endoscopy

Gastroesophageal reflux into a short segment of residual native
esophagus.

## 2019-05-15 ENCOUNTER — Telehealth (INDEPENDENT_AMBULATORY_CARE_PROVIDER_SITE_OTHER): Payer: Self-pay | Admitting: *Deleted

## 2019-05-15 NOTE — Telephone Encounter (Signed)
Patient was seen in office 03/28/19 - Dr Laural Golden wanted him scheduled for EGD (ulcer, GERD) once restrictions were lifted at [facility], I have called patient and left message asking him to call so we can get him scheduled

## 2019-06-04 ENCOUNTER — Encounter (HOSPITAL_COMMUNITY): Payer: Self-pay

## 2019-06-04 ENCOUNTER — Emergency Department (HOSPITAL_COMMUNITY)
Admission: EM | Admit: 2019-06-04 | Discharge: 2019-06-05 | Disposition: A | Discharge status: Home / Self Care | Payer: Medicaid Other | Attending: Emergency Medicine | Admitting: Emergency Medicine

## 2019-06-04 ENCOUNTER — Other Ambulatory Visit: Payer: Self-pay

## 2019-06-04 ENCOUNTER — Emergency Department (HOSPITAL_COMMUNITY): Payer: Medicaid Other

## 2019-06-04 DIAGNOSIS — Z880 Allergy status to penicillin: Secondary | ICD-10-CM | POA: Insufficient documentation

## 2019-06-04 DIAGNOSIS — K2100 Gastro-esophageal reflux disease with esophagitis, without bleeding: Secondary | ICD-10-CM

## 2019-06-04 DIAGNOSIS — F17220 Nicotine dependence, chewing tobacco, uncomplicated: Secondary | ICD-10-CM | POA: Diagnosis not present

## 2019-06-04 DIAGNOSIS — R0789 Other chest pain: Secondary | ICD-10-CM

## 2019-06-04 LAB — CBC
HCT: 47.2 % (ref 39.0–52.0)
Hemoglobin: 14.7 g/dL (ref 13.0–17.0)
MCH: 28.5 pg (ref 26.0–34.0)
MCHC: 31.1 g/dL (ref 30.0–36.0)
MCV: 91.5 fL (ref 80.0–100.0)
Platelets: 245 10*3/uL (ref 150–400)
RBC: 5.16 MIL/uL (ref 4.22–5.81)
RDW: 14 % (ref 11.5–15.5)
WBC: 11.3 10*3/uL — ABNORMAL HIGH (ref 4.0–10.5)
nRBC: 0 % (ref 0.0–0.2)

## 2019-06-04 MED ORDER — ONDANSETRON HCL 4 MG/2ML IJ SOLN
4.0000 mg | Freq: Once | INTRAMUSCULAR | Status: AC
  Administered 2019-06-05: 4 mg via INTRAVENOUS
  Filled 2019-06-04: qty 2

## 2019-06-04 MED ORDER — PANTOPRAZOLE SODIUM 40 MG PO TBEC
40.0000 mg | DELAYED_RELEASE_TABLET | Freq: Once | ORAL | Status: AC
  Administered 2019-06-05: 40 mg via ORAL
  Filled 2019-06-04: qty 1

## 2019-06-04 MED ORDER — SODIUM CHLORIDE 0.9% FLUSH
3.0000 mL | Freq: Once | INTRAVENOUS | Status: AC
  Administered 2019-06-05: 3 mL via INTRAVENOUS

## 2019-06-04 MED ORDER — MORPHINE SULFATE (PF) 4 MG/ML IV SOLN
4.0000 mg | Freq: Once | INTRAVENOUS | Status: AC
  Administered 2019-06-05: 4 mg via INTRAVENOUS
  Filled 2019-06-04: qty 1

## 2019-06-04 NOTE — ED Triage Notes (Signed)
Pt reports to ED with complaints of mid chest pain which feels like pressure started at 2030. Pt also nauseated and SOB and vomiting.

## 2019-06-05 ENCOUNTER — Encounter (HOSPITAL_COMMUNITY): Payer: Self-pay | Admitting: Emergency Medicine

## 2019-06-05 ENCOUNTER — Emergency Department (HOSPITAL_COMMUNITY): Payer: Medicaid Other

## 2019-06-05 ENCOUNTER — Other Ambulatory Visit: Payer: Self-pay

## 2019-06-05 ENCOUNTER — Emergency Department (HOSPITAL_COMMUNITY)
Admission: EM | Admit: 2019-06-05 | Discharge: 2019-06-05 | Disposition: A | Discharge status: Home / Self Care | Payer: Medicaid Other | Source: Home / Self Care | Attending: Emergency Medicine | Admitting: Emergency Medicine

## 2019-06-05 DIAGNOSIS — K2100 Gastro-esophageal reflux disease with esophagitis, without bleeding: Secondary | ICD-10-CM

## 2019-06-05 DIAGNOSIS — R0789 Other chest pain: Secondary | ICD-10-CM

## 2019-06-05 DIAGNOSIS — Z87891 Personal history of nicotine dependence: Secondary | ICD-10-CM | POA: Insufficient documentation

## 2019-06-05 DIAGNOSIS — Z79899 Other long term (current) drug therapy: Secondary | ICD-10-CM | POA: Insufficient documentation

## 2019-06-05 DIAGNOSIS — R1011 Right upper quadrant pain: Secondary | ICD-10-CM | POA: Insufficient documentation

## 2019-06-05 LAB — BASIC METABOLIC PANEL
Anion gap: 12 (ref 5–15)
BUN: 10 mg/dL (ref 6–20)
CO2: 27 mmol/L (ref 22–32)
Calcium: 9.2 mg/dL (ref 8.9–10.3)
Chloride: 103 mmol/L (ref 98–111)
Creatinine, Ser: 1 mg/dL (ref 0.61–1.24)
GFR calc Af Amer: >60 mL/min (ref >60)
GFR calc non Af Amer: >60 mL/min (ref >60)
Glucose, Bld: 120 mg/dL — ABNORMAL HIGH (ref 70–99)
Potassium: 4.1 mmol/L (ref 3.5–5.1)
Sodium: 142 mmol/L (ref 135–145)

## 2019-06-05 LAB — URINALYSIS, ROUTINE W REFLEX MICROSCOPIC
Bacteria, UA: NONE SEEN
Bilirubin Urine: NEGATIVE
Glucose, UA: 50 mg/dL — AB
Ketones, ur: 20 mg/dL — AB
Leukocytes,Ua: NEGATIVE
Nitrite: NEGATIVE
Protein, ur: NEGATIVE mg/dL
Specific Gravity, Urine: 1.019 (ref 1.005–1.030)
pH: 6 (ref 5.0–8.0)

## 2019-06-05 LAB — CBC WITH DIFFERENTIAL/PLATELET
Abs Immature Granulocytes: 0.08 10*3/uL — ABNORMAL HIGH (ref 0.00–0.07)
Basophils Absolute: 0.1 10*3/uL (ref 0.0–0.1)
Basophils Relative: 0 %
Eosinophils Absolute: 0 10*3/uL (ref 0.0–0.5)
Eosinophils Relative: 0 %
HCT: 49.3 % (ref 39.0–52.0)
Hemoglobin: 15.4 g/dL (ref 13.0–17.0)
Immature Granulocytes: 0 %
Lymphocytes Relative: 3 %
Lymphs Abs: 0.6 10*3/uL — ABNORMAL LOW (ref 0.7–4.0)
MCH: 28.5 pg (ref 26.0–34.0)
MCHC: 31.2 g/dL (ref 30.0–36.0)
MCV: 91.3 fL (ref 80.0–100.0)
Monocytes Absolute: 1.3 10*3/uL — ABNORMAL HIGH (ref 0.1–1.0)
Monocytes Relative: 6 %
Neutro Abs: 17.9 10*3/uL — ABNORMAL HIGH (ref 1.7–7.7)
Neutrophils Relative %: 91 %
Platelets: 240 10*3/uL (ref 150–400)
RBC: 5.4 MIL/uL (ref 4.22–5.81)
RDW: 14.2 % (ref 11.5–15.5)
WBC: 19.9 10*3/uL — ABNORMAL HIGH (ref 4.0–10.5)
nRBC: 0 % (ref 0.0–0.2)

## 2019-06-05 LAB — COMPREHENSIVE METABOLIC PANEL
ALT: 19 U/L (ref 0–44)
AST: 17 U/L (ref 15–41)
Albumin: 4.2 g/dL (ref 3.5–5.0)
Alkaline Phosphatase: 76 U/L (ref 38–126)
Anion gap: 11 (ref 5–15)
BUN: 10 mg/dL (ref 6–20)
CO2: 26 mmol/L (ref 22–32)
Calcium: 9.8 mg/dL (ref 8.9–10.3)
Chloride: 104 mmol/L (ref 98–111)
Creatinine, Ser: 0.82 mg/dL (ref 0.61–1.24)
GFR calc Af Amer: >60 mL/min (ref >60)
GFR calc non Af Amer: >60 mL/min (ref >60)
Glucose, Bld: 143 mg/dL — ABNORMAL HIGH (ref 70–99)
Potassium: 4 mmol/L (ref 3.5–5.1)
Sodium: 141 mmol/L (ref 135–145)
Total Bilirubin: 1.1 mg/dL (ref 0.3–1.2)
Total Protein: 7.3 g/dL (ref 6.5–8.1)

## 2019-06-05 LAB — TROPONIN I (HIGH SENSITIVITY)
Troponin I (High Sensitivity): <2 ng/L (ref <18)
Troponin I (High Sensitivity): 2 ng/L (ref <18)

## 2019-06-05 LAB — HEPATIC FUNCTION PANEL
ALT: 19 U/L (ref 0–44)
AST: 17 U/L (ref 15–41)
Albumin: 4 g/dL (ref 3.5–5.0)
Alkaline Phosphatase: 71 U/L (ref 38–126)
Bilirubin, Direct: 0.1 mg/dL (ref 0.0–0.2)
Indirect Bilirubin: 0.8 mg/dL (ref 0.3–0.9)
Total Bilirubin: 0.9 mg/dL (ref 0.3–1.2)
Total Protein: 6.5 g/dL (ref 6.5–8.1)

## 2019-06-05 LAB — LIPASE, BLOOD
Lipase: 29 U/L (ref 11–51)
Lipase: 22 U/L (ref 11–51)

## 2019-06-05 MED ORDER — HYDROMORPHONE HCL 2 MG PO TABS
2.0000 mg | ORAL_TABLET | Freq: Once | ORAL | Status: AC
  Administered 2019-06-05: 2 mg via ORAL
  Filled 2019-06-05: qty 1

## 2019-06-05 MED ORDER — HYDROMORPHONE HCL 2 MG PO TABS: 2.0000 mg | ORAL_TABLET | Freq: Four times a day (QID) | ORAL | 0 refills | Status: AC | PRN

## 2019-06-05 MED ORDER — SODIUM CHLORIDE 0.9 % IV BOLUS
1000.0000 mL | Freq: Once | INTRAVENOUS | Status: AC
  Administered 2019-06-05: 1000 mL via INTRAVENOUS

## 2019-06-05 MED ORDER — PANTOPRAZOLE SODIUM 40 MG PO TBEC: 40.0000 mg | DELAYED_RELEASE_TABLET | Freq: Two times a day (BID) | ORAL | 0 refills | Status: DC

## 2019-06-05 MED ORDER — OXYCODONE-ACETAMINOPHEN 5-325 MG PO TABS
1.0000 | ORAL_TABLET | Freq: Once | ORAL | Status: AC
  Administered 2019-06-05: 1 via ORAL
  Filled 2019-06-05: qty 1

## 2019-06-05 MED ORDER — SUCRALFATE 1 GM/10ML PO SUSP: 1.0000 g | Freq: Three times a day (TID) | ORAL | 0 refills | Status: DC

## 2019-06-05 MED ORDER — SUCRALFATE 1 GM/10ML PO SUSP
1.0000 g | Freq: Three times a day (TID) | ORAL | Status: DC
  Administered 2019-06-05: 1 g via ORAL
  Filled 2019-06-05: qty 10

## 2019-06-05 MED ORDER — ONDANSETRON 4 MG PO TBDP: 4.0000 mg | ORAL_TABLET | Freq: Three times a day (TID) | ORAL | 0 refills | Status: DC | PRN

## 2019-06-05 MED ORDER — ONDANSETRON HCL 4 MG/2ML IJ SOLN
4.0000 mg | Freq: Once | INTRAMUSCULAR | Status: AC
  Administered 2019-06-05: 4 mg via INTRAVENOUS
  Filled 2019-06-05: qty 2

## 2019-06-05 MED ORDER — IOHEXOL 300 MG/ML  SOLN
100.0000 mL | Freq: Once | INTRAMUSCULAR | Status: AC | PRN
  Administered 2019-06-05: 100 mL via INTRAVENOUS

## 2019-06-05 MED ORDER — ALUM & MAG HYDROXIDE-SIMETH 200-200-20 MG/5ML PO SUSP
30.0000 mL | Freq: Once | ORAL | Status: AC
  Administered 2019-06-05: 30 mL via ORAL
  Filled 2019-06-05: qty 30

## 2019-06-05 MED ORDER — ONDANSETRON 4 MG PO TBDP
4.0000 mg | ORAL_TABLET | Freq: Once | ORAL | Status: AC
  Administered 2019-06-05: 16:00:00 4 mg via ORAL
  Filled 2019-06-05: qty 1

## 2019-06-05 MED ORDER — HYDROMORPHONE HCL 1 MG/ML IJ SOLN
1.0000 mg | Freq: Once | INTRAMUSCULAR | Status: AC
  Administered 2019-06-05: 1 mg via INTRAVENOUS
  Filled 2019-06-05: qty 1

## 2019-06-05 NOTE — ED Notes (Signed)
Pt states, "If they send me home, Im coming right back."

## 2019-06-05 NOTE — ED Notes (Signed)
Pt has consistently  Request Dilaudid IV.  Currently asking for IV Dilaudid IV instead or PO.  Informed that med are given as ordered.

## 2019-06-05 NOTE — ED Notes (Signed)
Patient able to drink water.

## 2019-06-05 NOTE — Discharge Instructions (Addendum)
Call Dr. Laural Golden for an office visit for a recheck of your symptoms if they persist.  In the interim,  you may take the dilaudid tablet in place of your hydrocodone tablet which may give better pain relief.  Continue taking the protonix and carafate you were prescribed this morning.

## 2019-06-05 NOTE — ED Triage Notes (Signed)
C/o of recurrent central chest pain that radiates to his right side.  Pt states " I need something stronger than what yall gave me last night so I can get some rest"
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
## 2019-06-05 NOTE — Discharge Instructions (Addendum)
You were seen today for chest pain.  This is likely related to your reflux and esophagitis.  Follow-up with Dr. Joya Gaskins.  You need to restart Protonix and Carafate.  Take Zofran as needed.

## 2019-06-05 NOTE — ED Notes (Signed)
Pt requested to be wheeled to front.  Pt reported that he would be walking home about 2 streets over behind [employer].  Once I wheeled pt to front, he got into a white car and said he decided he was going to drive home.  Pts last IV dose of Dilaudid was at about 12 noon and was just given PO dose of Dilaudid at 1546.  Pt said he was going to drive home instead of walking.  Informed Evalee Jefferson, PA.

## 2019-06-11 ENCOUNTER — Other Ambulatory Visit: Payer: Self-pay

## 2019-06-11 ENCOUNTER — Emergency Department (HOSPITAL_COMMUNITY): Payer: Medicaid Other

## 2019-06-11 ENCOUNTER — Emergency Department (HOSPITAL_COMMUNITY)
Admission: EM | Admit: 2019-06-11 | Discharge: 2019-06-11 | Disposition: A | Discharge status: Other Acute Inpatient Hospital | Payer: Medicaid Other | Attending: Emergency Medicine | Admitting: Emergency Medicine

## 2019-06-11 ENCOUNTER — Encounter (HOSPITAL_COMMUNITY): Payer: Self-pay | Admitting: Emergency Medicine

## 2019-06-11 DIAGNOSIS — Z87891 Personal history of nicotine dependence: Secondary | ICD-10-CM | POA: Diagnosis not present

## 2019-06-11 DIAGNOSIS — R0789 Other chest pain: Secondary | ICD-10-CM | POA: Insufficient documentation

## 2019-06-11 DIAGNOSIS — Z20828 Contact with and (suspected) exposure to other viral communicable diseases: Secondary | ICD-10-CM | POA: Diagnosis not present

## 2019-06-11 DIAGNOSIS — K2210 Ulcer of esophagus without bleeding: Secondary | ICD-10-CM

## 2019-06-11 LAB — URINALYSIS, ROUTINE W REFLEX MICROSCOPIC
Bilirubin Urine: NEGATIVE
Glucose, UA: NEGATIVE mg/dL
Hgb urine dipstick: NEGATIVE
Ketones, ur: 20 mg/dL — AB
Leukocytes,Ua: NEGATIVE
Nitrite: NEGATIVE
Protein, ur: NEGATIVE mg/dL
Specific Gravity, Urine: 1.011 (ref 1.005–1.030)
pH: 6 (ref 5.0–8.0)

## 2019-06-11 LAB — CBC
HCT: 45.9 % (ref 39.0–52.0)
Hemoglobin: 14.8 g/dL (ref 13.0–17.0)
MCH: 28.2 pg (ref 26.0–34.0)
MCHC: 32.2 g/dL (ref 30.0–36.0)
MCV: 87.4 fL (ref 80.0–100.0)
Platelets: 284 10*3/uL (ref 150–400)
RBC: 5.25 MIL/uL (ref 4.22–5.81)
RDW: 14.3 % (ref 11.5–15.5)
WBC: 35.6 10*3/uL — ABNORMAL HIGH (ref 4.0–10.5)
nRBC: 0 % (ref 0.0–0.2)

## 2019-06-11 LAB — BASIC METABOLIC PANEL
Anion gap: 17 — ABNORMAL HIGH (ref 5–15)
BUN: 18 mg/dL (ref 6–20)
CO2: 25 mmol/L (ref 22–32)
Calcium: 8.6 mg/dL — ABNORMAL LOW (ref 8.9–10.3)
Chloride: 86 mmol/L — ABNORMAL LOW (ref 98–111)
Creatinine, Ser: 0.88 mg/dL (ref 0.61–1.24)
GFR calc Af Amer: >60 mL/min (ref >60)
GFR calc non Af Amer: >60 mL/min (ref >60)
Glucose, Bld: 102 mg/dL — ABNORMAL HIGH (ref 70–99)
Potassium: 4.9 mmol/L (ref 3.5–5.1)
Sodium: 128 mmol/L — ABNORMAL LOW (ref 135–145)

## 2019-06-11 LAB — LACTIC ACID, PLASMA: Lactic Acid, Venous: 1.2 mmol/L (ref 0.5–1.9)

## 2019-06-11 LAB — TROPONIN I (HIGH SENSITIVITY): Troponin I (High Sensitivity): 7 ng/L (ref <18)

## 2019-06-11 LAB — SARS CORONAVIRUS 2 BY RT PCR (HOSPITAL ORDER, PERFORMED IN ~~LOC~~ HOSPITAL LAB): SARS Coronavirus 2: NEGATIVE

## 2019-06-11 MED ORDER — HYDROMORPHONE HCL 1 MG/ML IJ SOLN
0.5000 mg | Freq: Once | INTRAMUSCULAR | Status: AC
  Administered 2019-06-11: 0.5 mg via INTRAVENOUS
  Filled 2019-06-11: qty 1

## 2019-06-11 MED ORDER — SODIUM CHLORIDE 0.9% FLUSH: 3.0000 mL | Freq: Once | INTRAVENOUS | Status: DC

## 2019-06-11 MED ORDER — SODIUM CHLORIDE 0.9 % IV BOLUS
1000.0000 mL | Freq: Once | INTRAVENOUS | Status: AC
  Administered 2019-06-11: 1000 mL via INTRAVENOUS

## 2019-06-11 MED ORDER — ONDANSETRON HCL 4 MG/2ML IJ SOLN
4.0000 mg | Freq: Once | INTRAMUSCULAR | Status: AC
  Administered 2019-06-11: 4 mg via INTRAVENOUS
  Filled 2019-06-11: qty 2

## 2019-06-11 MED ORDER — SODIUM CHLORIDE 0.9 % IV BOLUS
2000.0000 mL | Freq: Once | INTRAVENOUS | Status: AC
  Administered 2019-06-11: 2000 mL via INTRAVENOUS

## 2019-06-11 MED ORDER — LEVOFLOXACIN IN D5W 500 MG/100ML IV SOLN
500.0000 mg | Freq: Once | INTRAVENOUS | Status: AC
  Administered 2019-06-11: 500 mg via INTRAVENOUS
  Filled 2019-06-11: qty 100

## 2019-06-11 MED ORDER — VANCOMYCIN HCL IN DEXTROSE 1-5 GM/200ML-% IV SOLN: 1000.0000 mg | Freq: Once | INTRAVENOUS | Status: DC

## 2019-06-11 MED ORDER — PIPERACILLIN-TAZOBACTAM 3.375 G IVPB 30 MIN
3.3750 g | Freq: Once | INTRAVENOUS | Status: AC
  Administered 2019-06-11: 3.375 g via INTRAVENOUS
  Filled 2019-06-11: qty 50

## 2019-06-11 MED ORDER — IOHEXOL 300 MG/ML  SOLN
75.0000 mL | Freq: Once | INTRAMUSCULAR | Status: AC | PRN
  Administered 2019-06-11: 75 mL via INTRAVENOUS

## 2019-06-11 NOTE — ED Triage Notes (Signed)
Pt c/o ongoing RT sided chest pain that radiates to back x 1 week. Pt evaluated in ED last week for same complaint. Pt states that pain improved, but came back. Pt also states when he takes a deep breath he coughs. Denies fever. Denies any covid exposure.
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
## 2019-06-11 NOTE — ED Notes (Signed)
Pt is very difficult stick. 2 attempts by this RN as well as 2 attempts by another. Labs were able to be obtained.

## 2019-06-11 NOTE — ED Notes (Signed)
ED Provider at bedside. 

## 2019-06-11 NOTE — ED Notes (Signed)
Spoke with [facility] no trucks available at this time. [facility] [employer] called to transport patient emergency traffic to [facility] [place] accepting doctor [place].

## 2019-06-11 NOTE — ED Notes (Signed)
Pt was informed that we need a urine sample. Pt was given a urinal. 

## 2019-06-11 NOTE — Discharge Instructions (Signed)
Transfer to [facility] ED,  Dr. Virgina Organ accepting

## 2019-06-11 NOTE — ED Notes (Signed)
Patient transported to CT 

## 2019-06-16 LAB — CULTURE, BLOOD (ROUTINE X 2)
Culture: NO GROWTH
Culture: NO GROWTH
Specimen Description: ADEQUATE
Specimen Description: ADEQUATE

## 2019-06-24 IMAGING — CT CT CHEST WITH CONTRAST
2 of 4 series · 13 of 36 positions shown, 16 images · IV contrast (Isovue)
Comparison: 02/10/2019, 01/16/2019, 06/11/2016, chest CT
01/25/2019, CT 06/15/2009

CLINICAL DATA: 47-year-old male with chest pain after eating. Known
colonic interposition graft for the esophagus.

EXAM:
CT CHEST, ABDOMEN, AND PELVIS WITH CONTRAST
TECHNIQUE: Multidetector CT imaging of the chest, abdomen and pelvis was
performed following the standard protocol during bolus
administration of intravenous contrast.
CONTRAST:  100mL OMNIPAQUE IOHEXOL 300 MG/ML  SOLN

[Series 2: cap with · axial · 0.70mm/px · z∈[-512,+8]mm · 10 of 122 slices shown, 13 images]
[im 12/122  mediastinal]
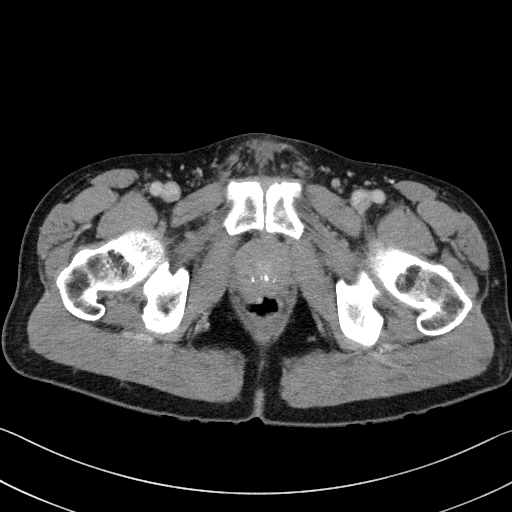
[im 12/122  lung]
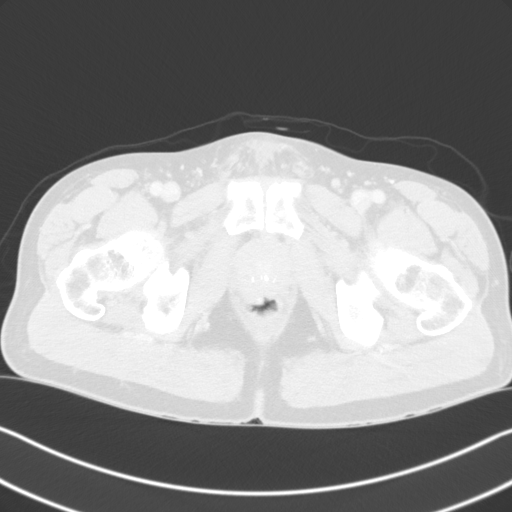
[im 24/122  lung]
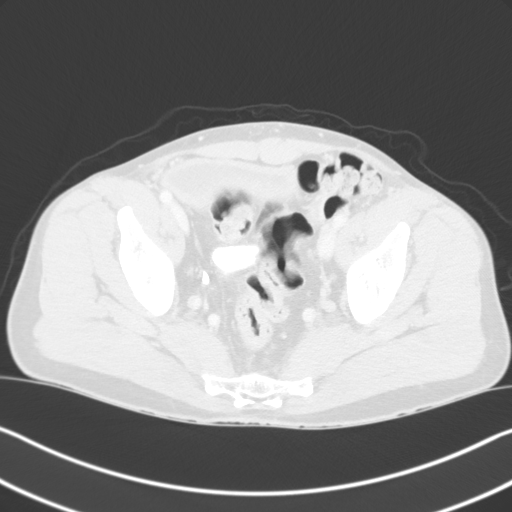
[im 35/122  lung]
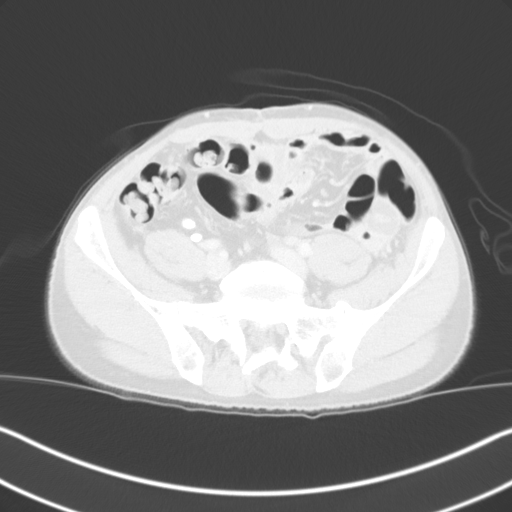
[im 47/122  lung]
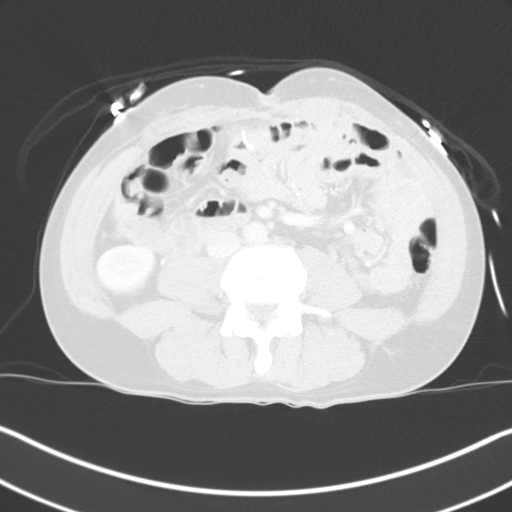
[im 58/122  mediastinal]
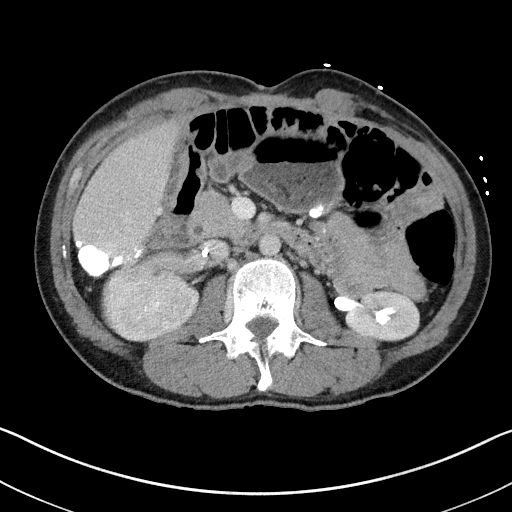
[im 58/122  lung]
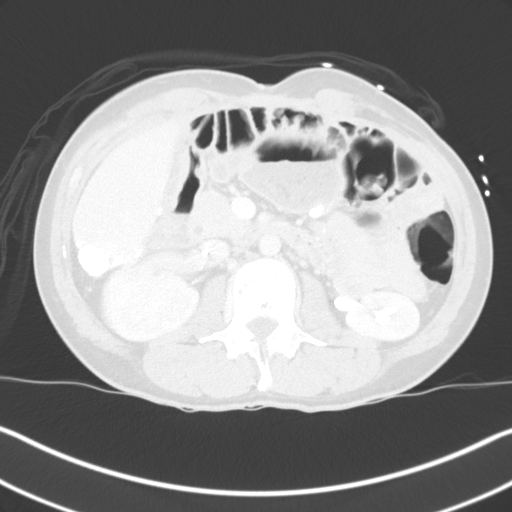
[im 70/122  lung]
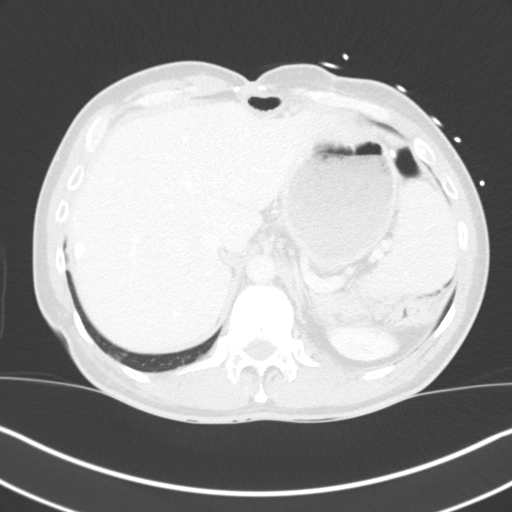
[im 81/122  lung]
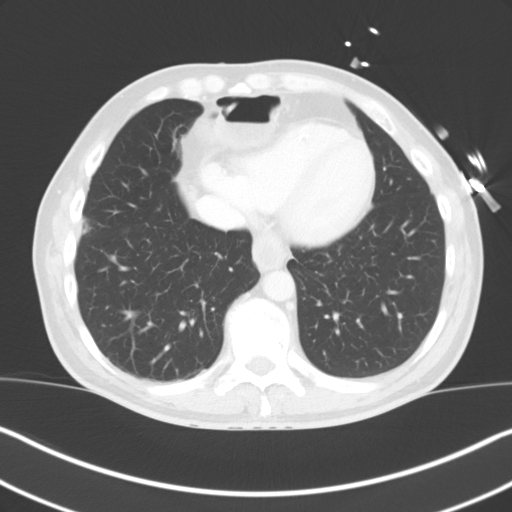
[im 93/122  lung]
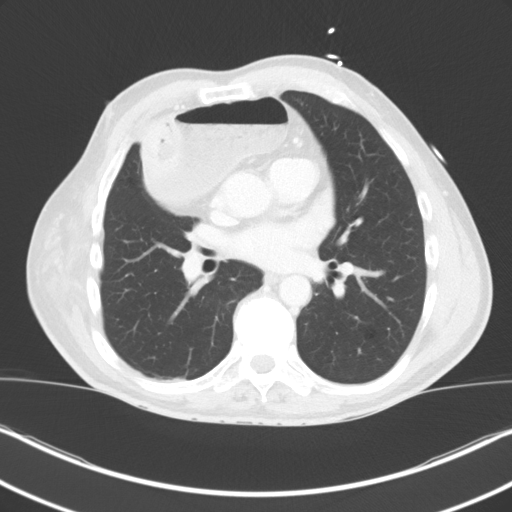
[im 104/122  mediastinal]
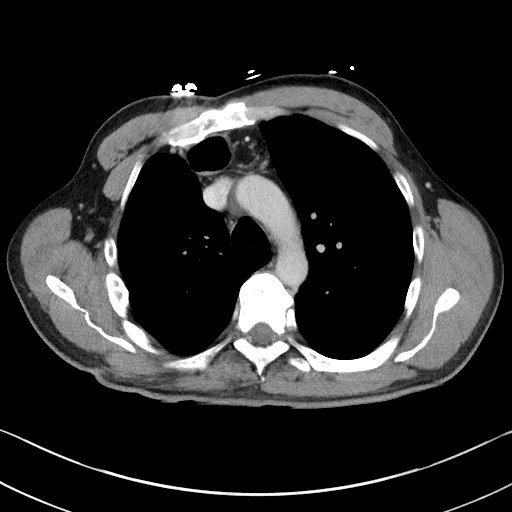
[im 104/122  lung]
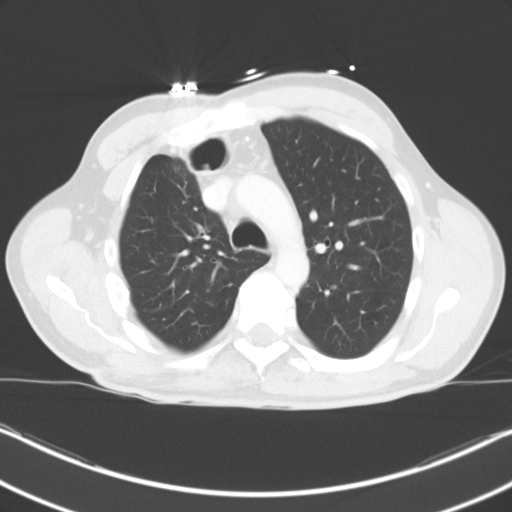
[im 116/122  lung]
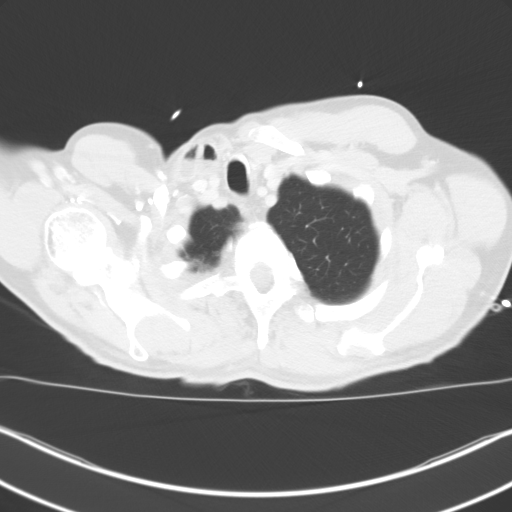

[Series 6: coronals · coronal · 0.82mm/px · 3 of 136 slices shown]
[im 28/136  lung]
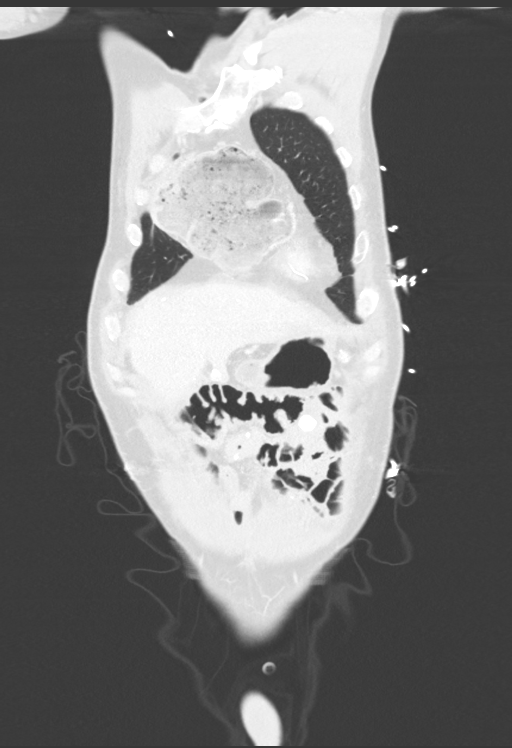
[im 55/136  lung]
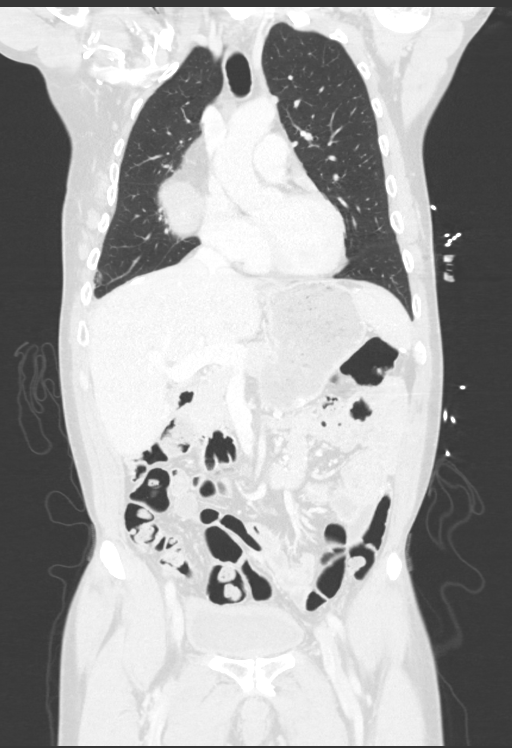
[im 82/136  lung]
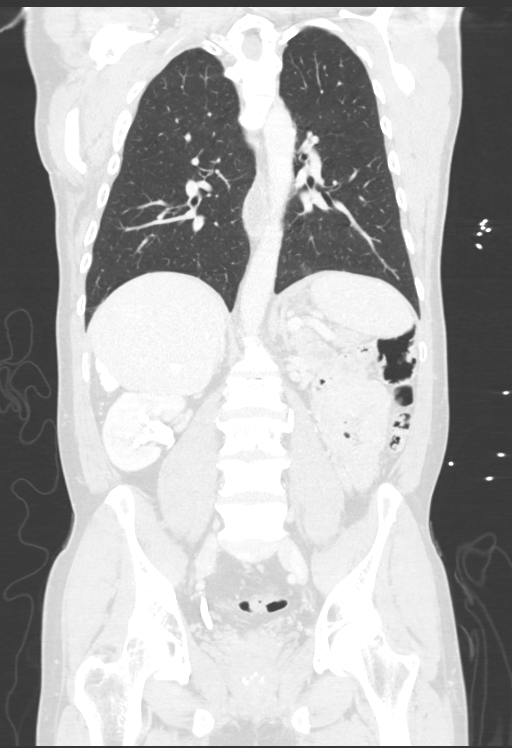

[13 of 36 positions shown; findings below may reference images not displayed]

FINDINGS: CT CHEST FINDINGS

Cardiovascular: Heart size within normal limits. No pericardial
fluid/thickening. Metallic foreign body within the right ventricle.
This has been present in the right ventricle since 06/11/2016.

Unremarkable course caliber and contour of the thoracic aorta.
Unremarkable pulmonary artery.

Mediastinum/Nodes: Redemonstration of surgical changes of colonic
interposition graft. The configuration is similar to that of the
comparison CT. There is persistent mild inflammation along the right
lateral aspect of the graft in the upper thorax. This appears to
represent potentially a blind outpouching, as this outpouching is
not in direct continuity with the remainder and has a separate and
trans directed superior laterally from the continuous lumen (more
medially). Again, there is circumferential inflammation of the
mucosa at this site, which appeared worse previously. No evidence of
obstruction.

No mediastinal adenopathy.

Similar appearance of the residual lower esophagus in the posterior
mediastinum.

Lungs/Pleura: No confluent airspace disease. No pneumothorax or
pleural effusion. Atelectasis/scarring.

CT ABDOMEN PELVIS FINDINGS

Hepatobiliary: Unremarkable liver. Coarse calcifications on the
liver margin, unchanged from prior. Cholecystectomy.

Pancreas: Unremarkable pancreas

Spleen: Unremarkable spleen

Adrenals/Urinary Tract: Unremarkable adrenal glands.

Symmetric appearance of the kidney perfusion. No hydronephrosis or
nephrolithiasis.

Stomach/Bowel: Surgical anastomosis of the colonic interposition
graft and the stomach unremarkable. Unremarkable stomach. Small
bowel unremarkable with no transition point. No focal wall
thickening appreciated. Colon unremarkable with postoperative
changes.

Vascular/Lymphatic: No significant atherosclerotic changes.

No adenopathy. Coarse calcifications within the peritoneum,
scattered throughout the abdomen, unchanged from prior.

IVC filter in position, slightly angulated compared to CT
07/03/2009. A left sided Ceejay appears imbedded within the L3
vertebral body.

Reproductive: Unremarkable pelvic structures.

Other: Surgical changes along the midline abdomen.

Musculoskeletal: Degenerative changes of the spine. No acute
displaced fracture.
IMPRESSION: Persistent inflammatory changes associated with the colonic
interposition graft, in a similar location to that on the comparison
CT of 01/25/2019, though somewhat decreased. The inflammation
appears to involve a blind ending outpouching of the graft, superior
laterally directed, and does not appear to have been opacified on
the esophagram dated 02/10/2019. Follow-up with gastroenterology
recommended, as this is suspected to represent inflamed
diverticulum.

Retained foreign body within the right ventricle, which appears to
be an embolized limb of the IVC filter. This has been present at
least since the CT dated 06/11/2016.

No acute abdominal process.

Redemonstration of surgical changes of the abdomen, as well as
multiple punctate coarsened calcifications through the peritoneum.

These results were discussed by telephone at the time of
interpretation on 03/29/2019 at [DATE] to Dr. JOSEFABIAN DIRKS.

IVC filter in position, with a left lateral Ceejay embedded within L3
vertebral body.

## 2019-08-11 ENCOUNTER — Other Ambulatory Visit: Payer: Self-pay

## 2019-08-11 DIAGNOSIS — Z20822 Contact with and (suspected) exposure to covid-19: Secondary | ICD-10-CM

## 2019-08-13 LAB — NOVEL CORONAVIRUS, NAA: SARS-CoV-2, NAA: NOT DETECTED

## 2019-08-30 IMAGING — DX CHEST - 2 VIEW
2 series · 2 of 2 positions shown · non-contrast
Comparison: 01/25/2019

CLINICAL DATA: Chest pain

EXAM:
CHEST - 2 VIEW

[chest lat]
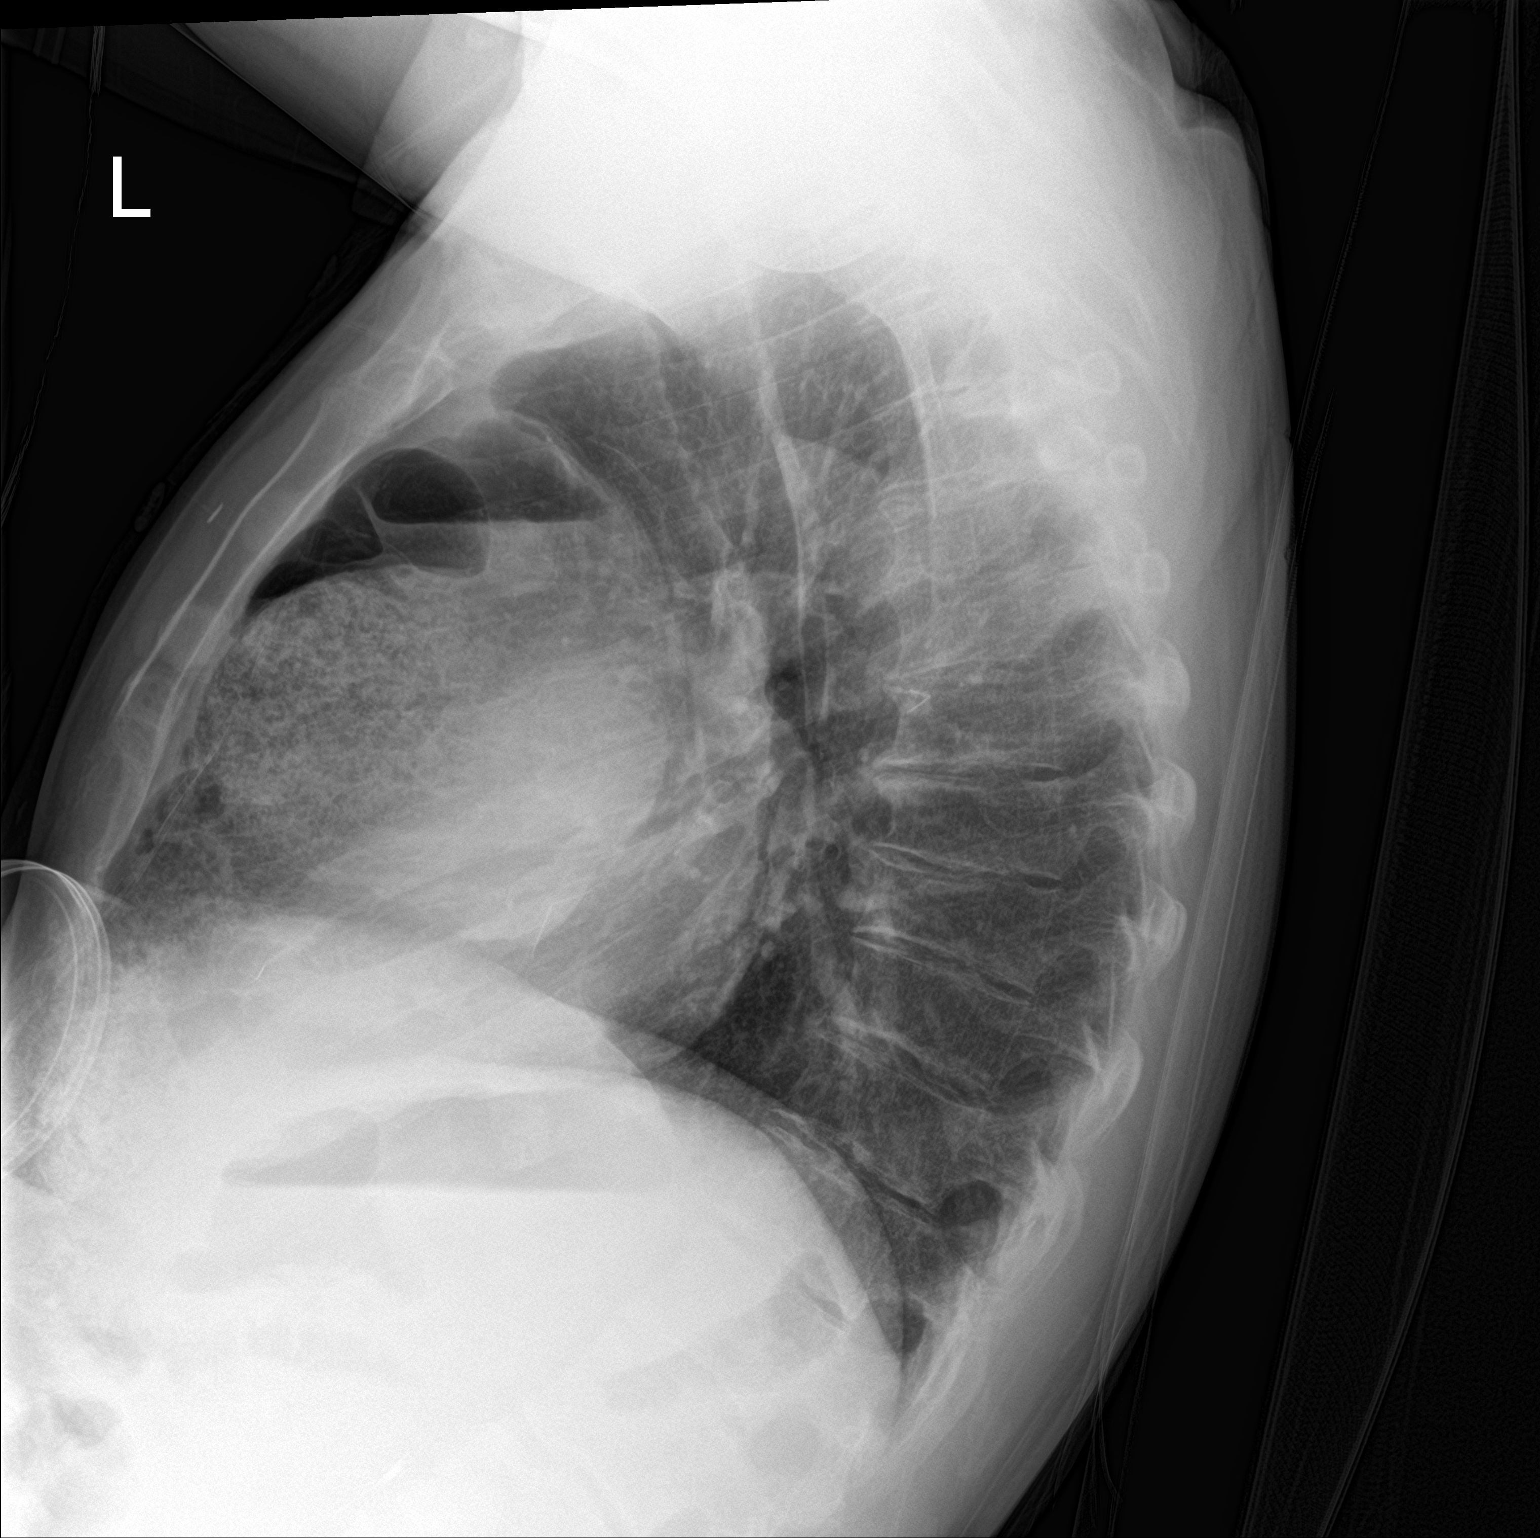

[chest ap]
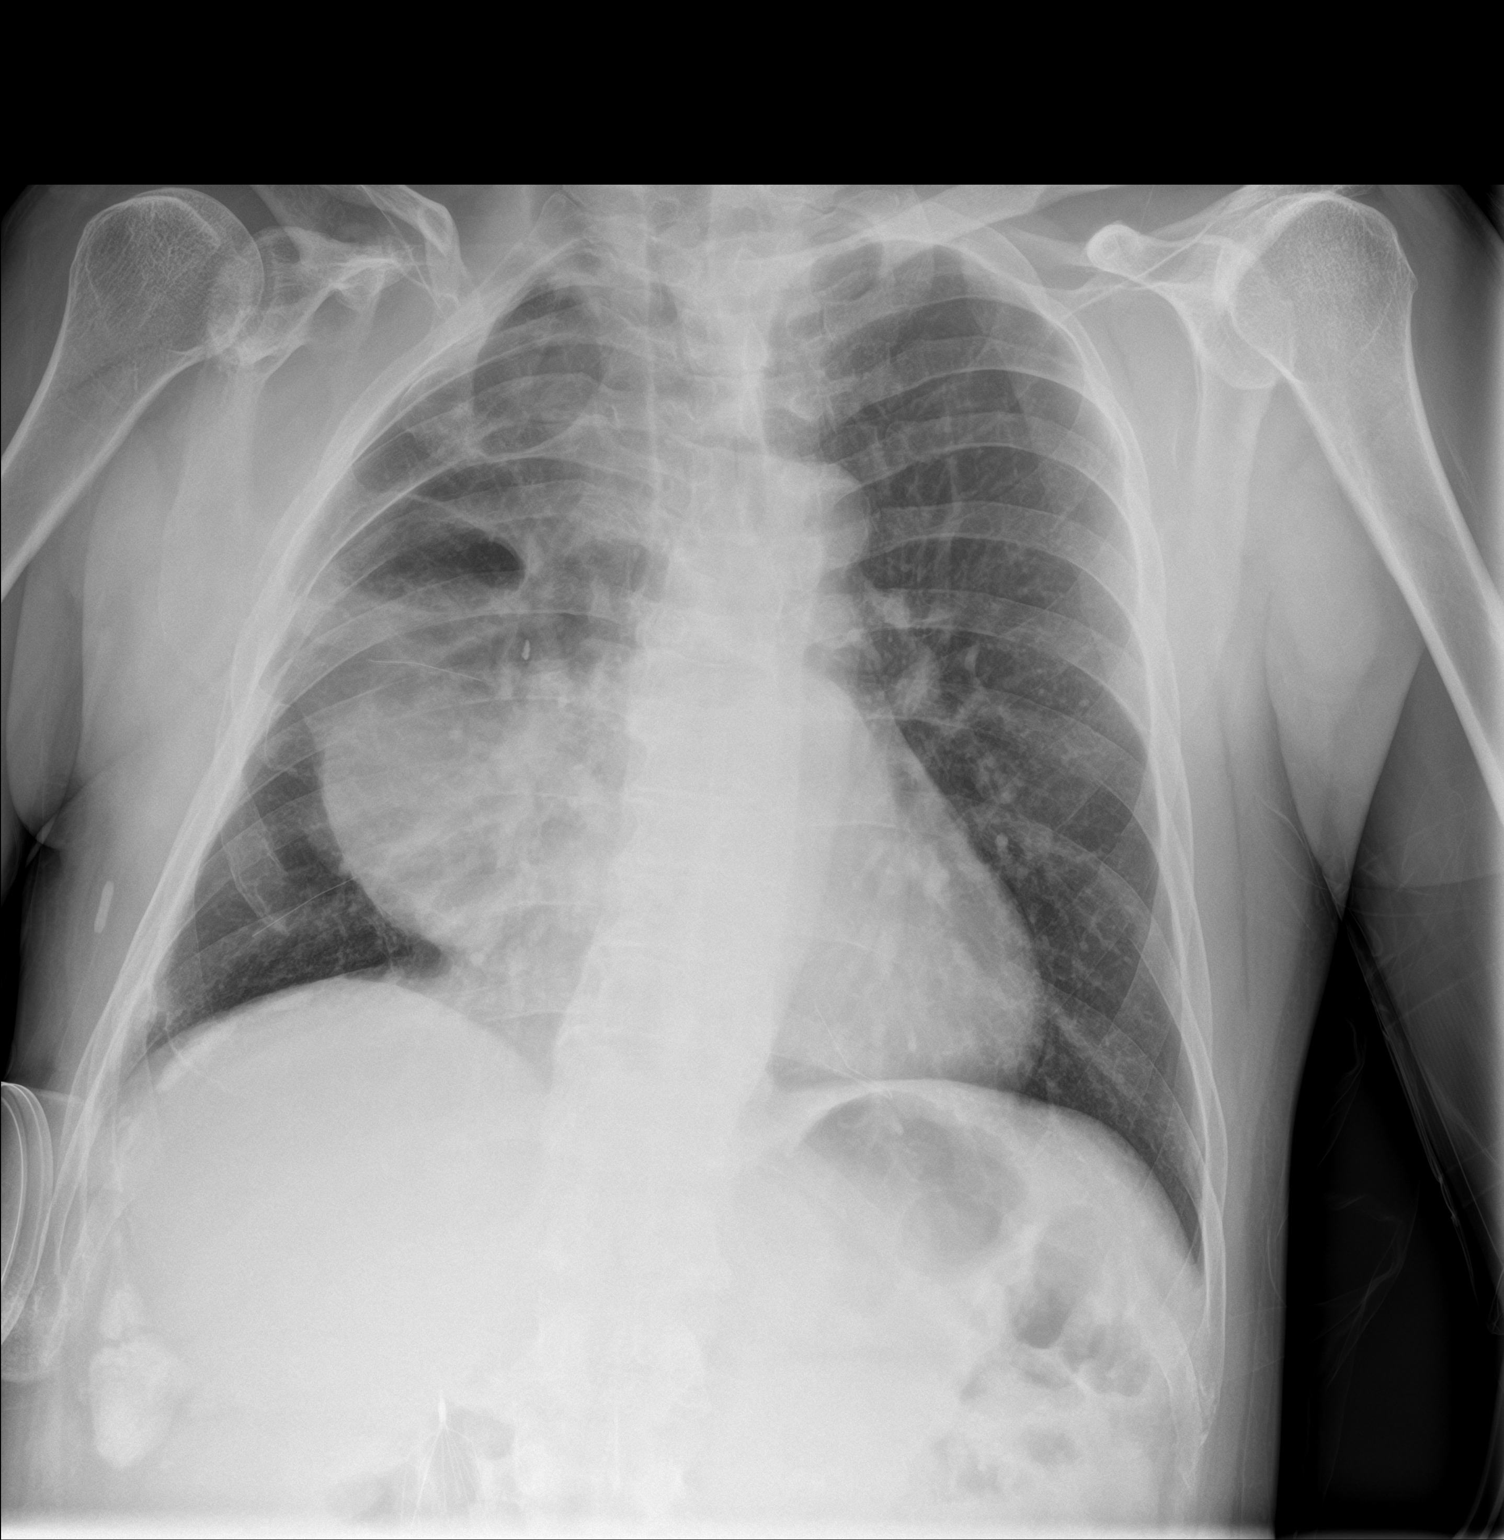

[2 of 2 positions shown; findings below may reference images not displayed]

FINDINGS: There is increased attenuation overlying the right mid and right
upper lung zone. This is new since prior chest x-ray. The right
heart border is obscured. This there is no large pleural effusion.
No acute osseous abnormality. Calcifications project over the liver.
There is no definite pneumothorax.
IMPRESSION: 1. Interval increase in attenuation in the right upper and right mid
lung zone favored to represent the patient's fluid-filled colonic
interposition graft. An underlying infiltrate seems less likely but
is difficult to entirely exclude.
2. No pneumothorax.  No large pleural effusion.

## 2019-08-31 IMAGING — CT CT CHEST WITH CONTRAST
2 of 5 series · 12 of 36 positions shown, 15 images · IV contrast (Isovue)
Comparison: CT chest, abdomen, and pelvis dated March 29, 2019.

CLINICAL DATA: Recurrent central chest pain radiating to the right
side. Prior colonic interposition graft for the esophagus.

EXAM:
CT CHEST, ABDOMEN, AND PELVIS WITH CONTRAST
TECHNIQUE: Multidetector CT imaging of the chest, abdomen and pelvis was
performed following the standard protocol during bolus
administration of intravenous contrast.
CONTRAST:  100mL OMNIPAQUE IOHEXOL 300 MG/ML  SOLN

[Series 2: cap with · axial · 0.68mm/px · z∈[+1040,+1555]mm · 9 of 129 slices shown, 12 images]
[im 13/129  mediastinal]
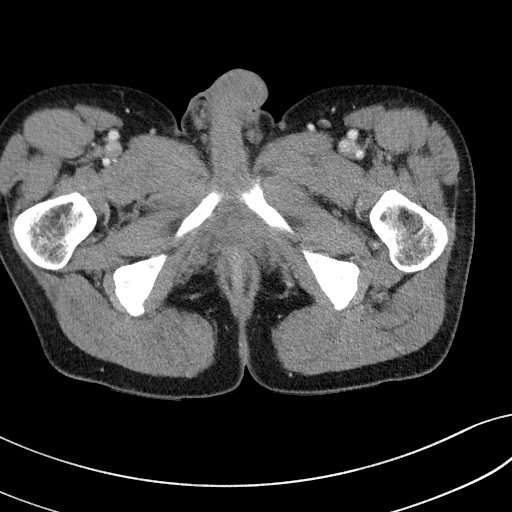
[im 13/129  lung]
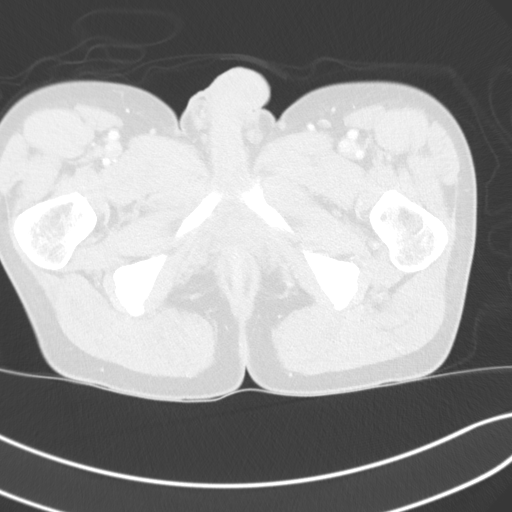
[im 26/129  lung]
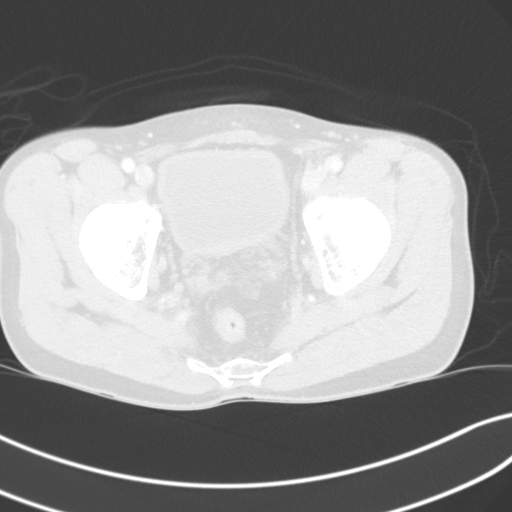
[im 39/129  lung]
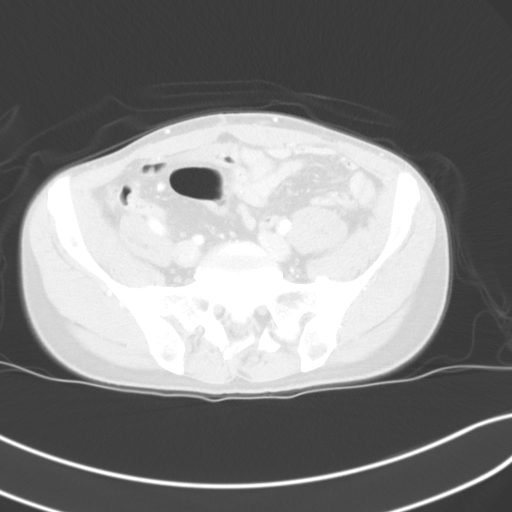
[im 52/129  lung]
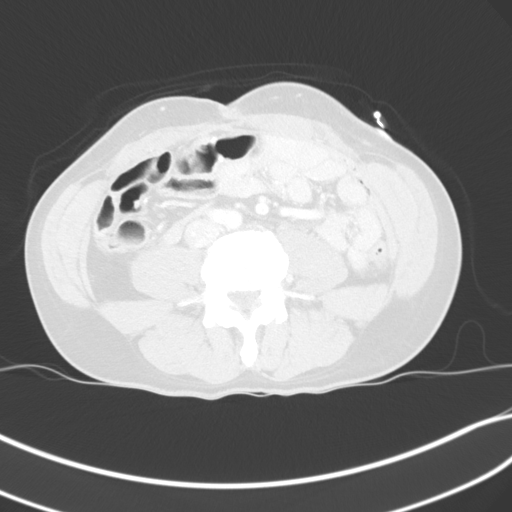
[im 65/129  mediastinal]
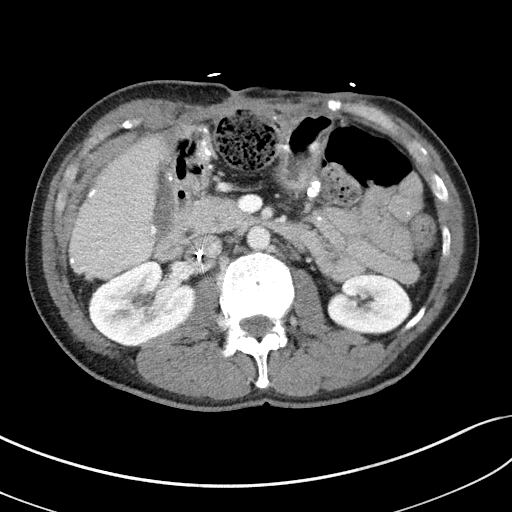
[im 65/129  lung]
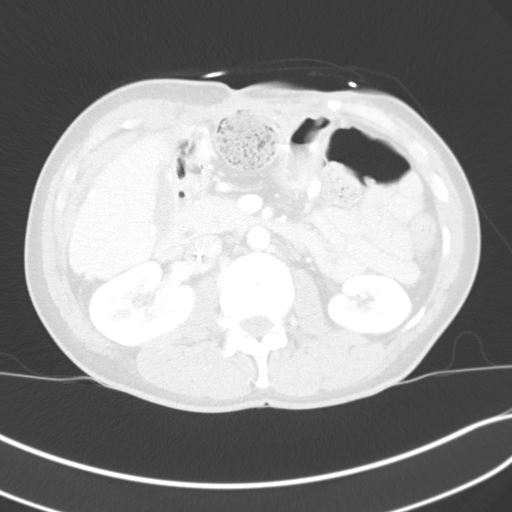
[im 77/129  lung]
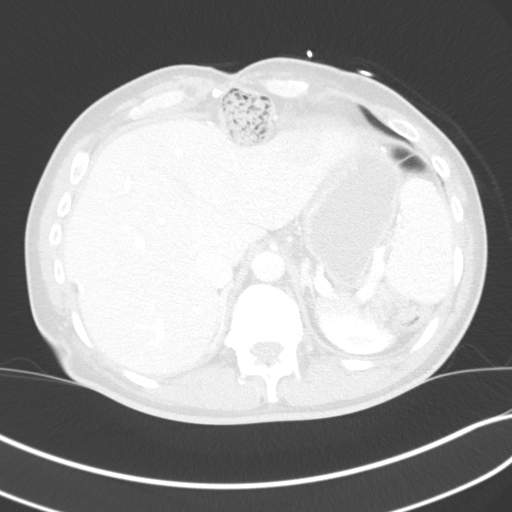
[im 90/129  lung]
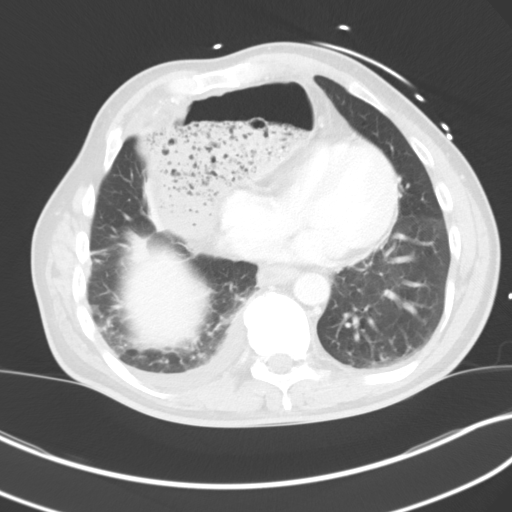
[im 103/129  lung]
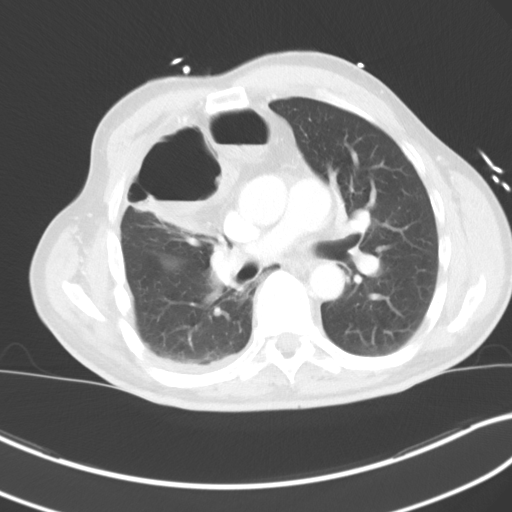
[im 116/129  mediastinal]
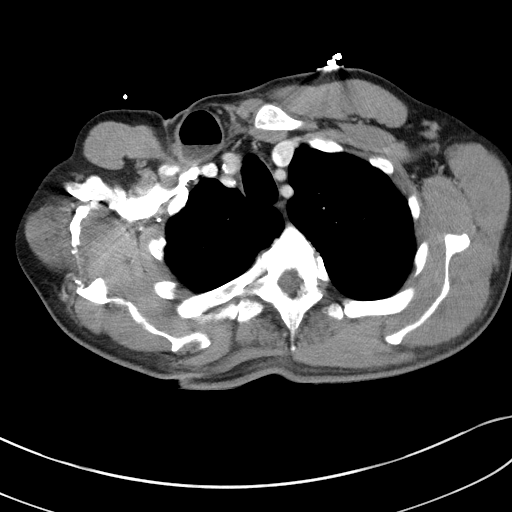
[im 116/129  lung]
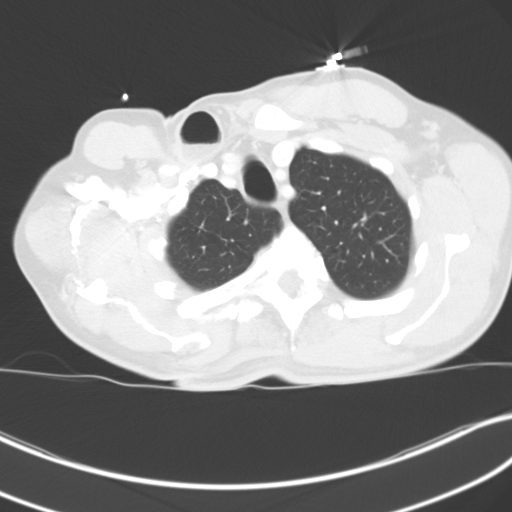

[Series 5: coronals · coronal · 0.83mm/px · 3 of 137 slices shown]
[im 28/137  lung]
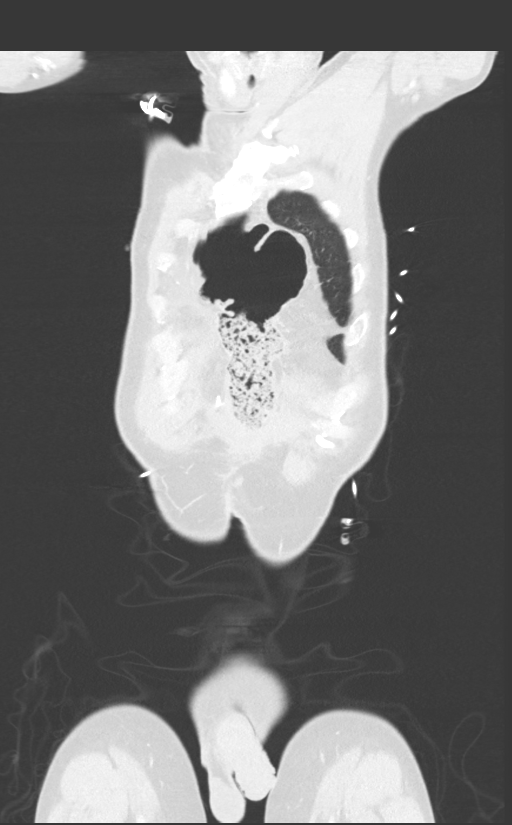
[im 55/137  lung]
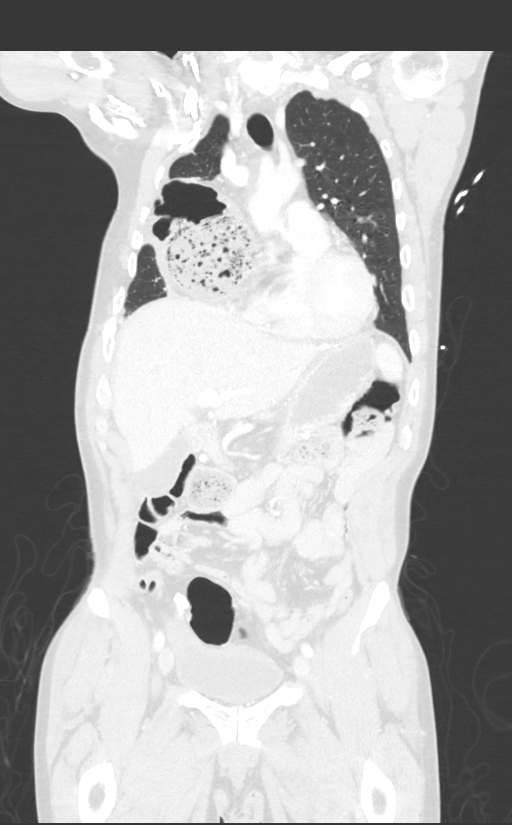
[im 82/137  lung]
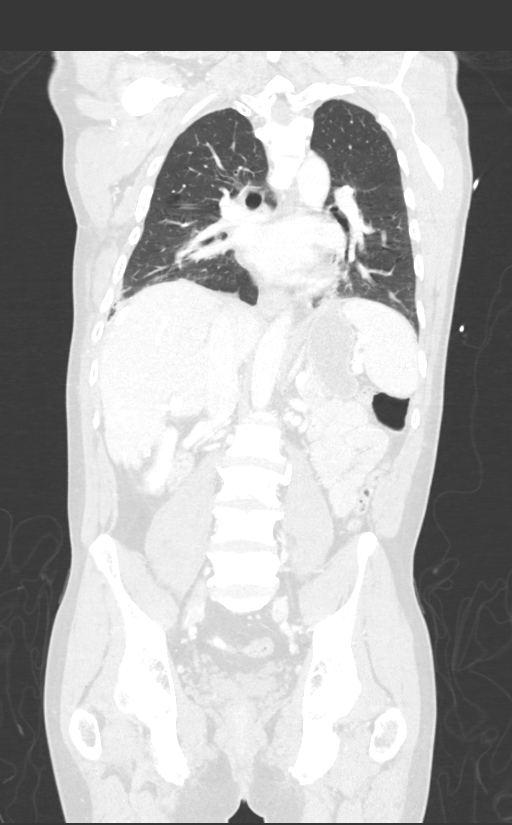

[12 of 36 positions shown; findings below may reference images not displayed]

FINDINGS: CT CHEST FINDINGS

Cardiovascular: Normal heart size. Unchanged thin linear metallic
foreign body in the right ventricle. No pericardial effusion. No
thoracic aortic aneurysm or dissection. No central pulmonary
embolism.

Mediastinum/Nodes: Colonic interposition graft again noted with
interval increased distension. Inflammatory changes surrounding the
previously described diverticulum of the mid graft appear improved.
No obstruction. Similar appearance of the residual lower esophagus
in the posterior mediastinum. No enlarged mediastinal, hilar, or
axillary lymph nodes. The thyroid gland and trachea demonstrate no
significant findings.

Lungs/Pleura: New small right pleural effusion. Increased
subsegmental atelectasis in the right middle and both lower lobes.
No consolidation or pneumothorax.

Musculoskeletal: No acute or significant osseous findings.

CT ABDOMEN PELVIS FINDINGS

Hepatobiliary: No new focal liver abnormality is seen. Coarse
calcifications along the liver margin are unchanged. The gallbladder
is unremarkable. No biliary dilatation.

Pancreas: Unremarkable. No pancreatic ductal dilatation or
surrounding inflammatory changes.

Spleen: Normal in size without focal abnormality.

Adrenals/Urinary Tract: Adrenal glands are unremarkable. Kidneys are
normal, without renal calculi, focal lesion, or hydronephrosis.
Unchanged mild circumferential bladder wall thickening.

Stomach/Bowel: Colonic interposition graft anastomosis the stomach
is unremarkable. The stomach is within normal limits. No bowel wall
thickening, distention, or surrounding inflammatory changes.

Vascular/Lymphatic: Unchanged IVC filter with a left-sided Alfu
embedded within the L3 vertebral body. No enlarged abdominal or
pelvic lymph nodes.

Reproductive: Prostate is unremarkable.

Other: No abdominal wall hernia or abnormality. No abdominopelvic
ascites. No pneumoperitoneum. Scattered coarse calcifications within
the peritoneal cavity are unchanged.

Musculoskeletal: No acute or significant osseous findings.
IMPRESSION: Chest:

1. Interval increased distention of the colonic interposition graft.
Improved inflammatory changes surrounding the previously described
graft diverticulum.
2. New small right pleural effusion.

Abdomen and pelvis:

1.  No acute intra-abdominal process.

## 2019-09-02 ENCOUNTER — Other Ambulatory Visit: Payer: Self-pay

## 2019-09-02 ENCOUNTER — Emergency Department (HOSPITAL_COMMUNITY)
Admission: EM | Admit: 2019-09-02 | Discharge: 2019-09-02 | Disposition: A | Discharge status: Home / Self Care | Payer: Medicaid Other | Attending: Emergency Medicine | Admitting: Emergency Medicine

## 2019-09-02 ENCOUNTER — Encounter (HOSPITAL_COMMUNITY): Payer: Self-pay | Admitting: Emergency Medicine

## 2019-09-02 DIAGNOSIS — Y732 Prosthetic and other implants, materials and accessory gastroenterology and urology devices associated with adverse incidents: Secondary | ICD-10-CM | POA: Diagnosis not present

## 2019-09-02 DIAGNOSIS — Z79899 Other long term (current) drug therapy: Secondary | ICD-10-CM | POA: Diagnosis not present

## 2019-09-02 DIAGNOSIS — Z87891 Personal history of nicotine dependence: Secondary | ICD-10-CM | POA: Insufficient documentation

## 2019-09-02 DIAGNOSIS — T85598A Other mechanical complication of other gastrointestinal prosthetic devices, implants and grafts, initial encounter: Secondary | ICD-10-CM | POA: Insufficient documentation

## 2019-09-02 NOTE — ED Notes (Signed)
Pt given another ice water per request  Okay for pt to feed self via gtube per Evalee Jefferson, PA

## 2019-09-02 NOTE — ED Triage Notes (Signed)
Patient brought in via EMS. Alert and oriented. Airway patent. Patient peg tub clogged. Per patient started getting clogged yesterday. Home Health Care nurse came and was unsuccessful. Per patient has not been able to administer meds or feedings.

## 2019-09-02 NOTE — ED Notes (Signed)
Gtube unclogged and flushed with water by Daphyne, RN and Margreta Journey, RN.
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
## 2019-09-06 IMAGING — DX CHEST - 2 VIEW
2 series · 2 of 2 positions shown · non-contrast
Comparison: CT scan June 05, 2019.  Chest x-ray June 04, 2019.

CLINICAL DATA: Right-sided chest pain.

EXAM:
CHEST - 2 VIEW

[chest lat]
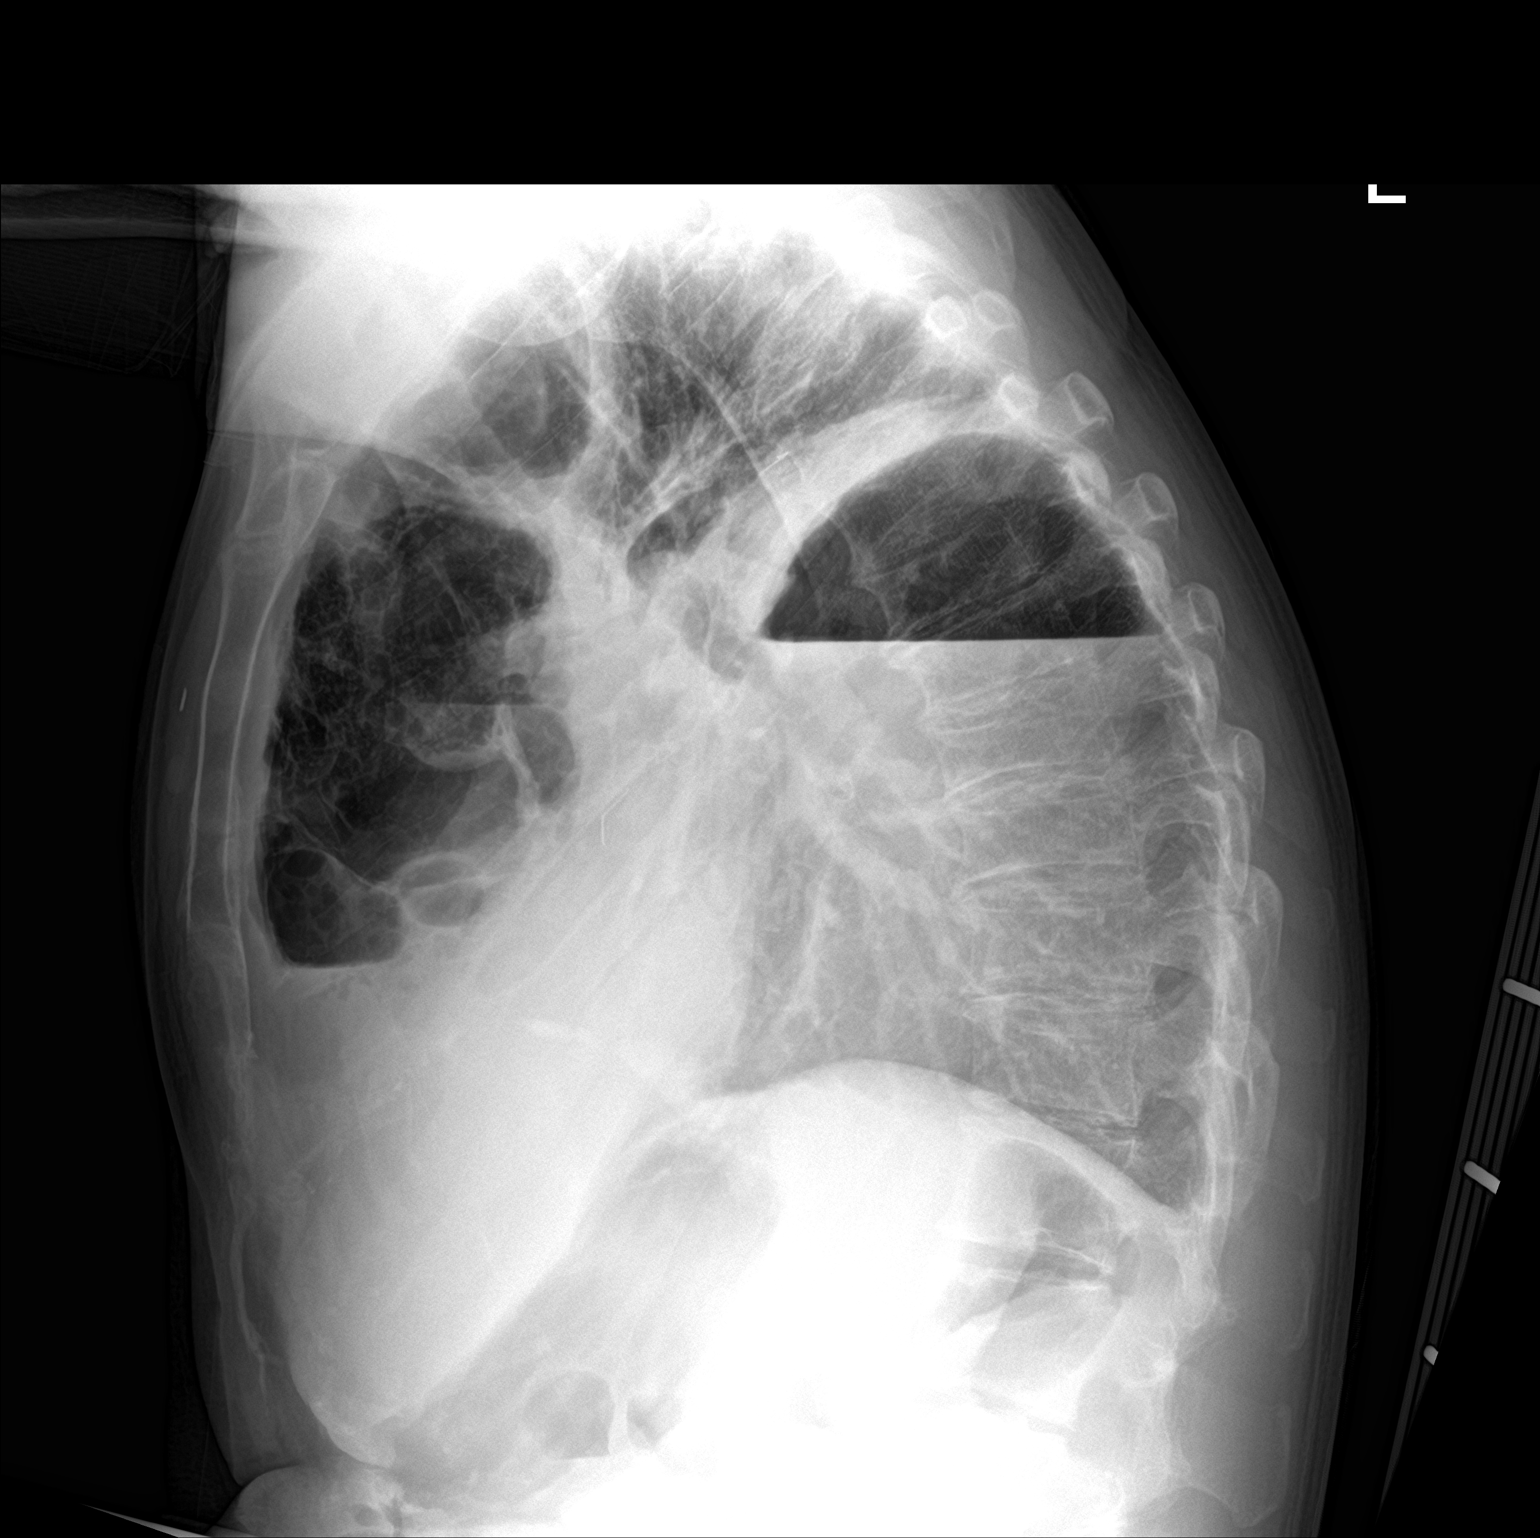

[chest ap]
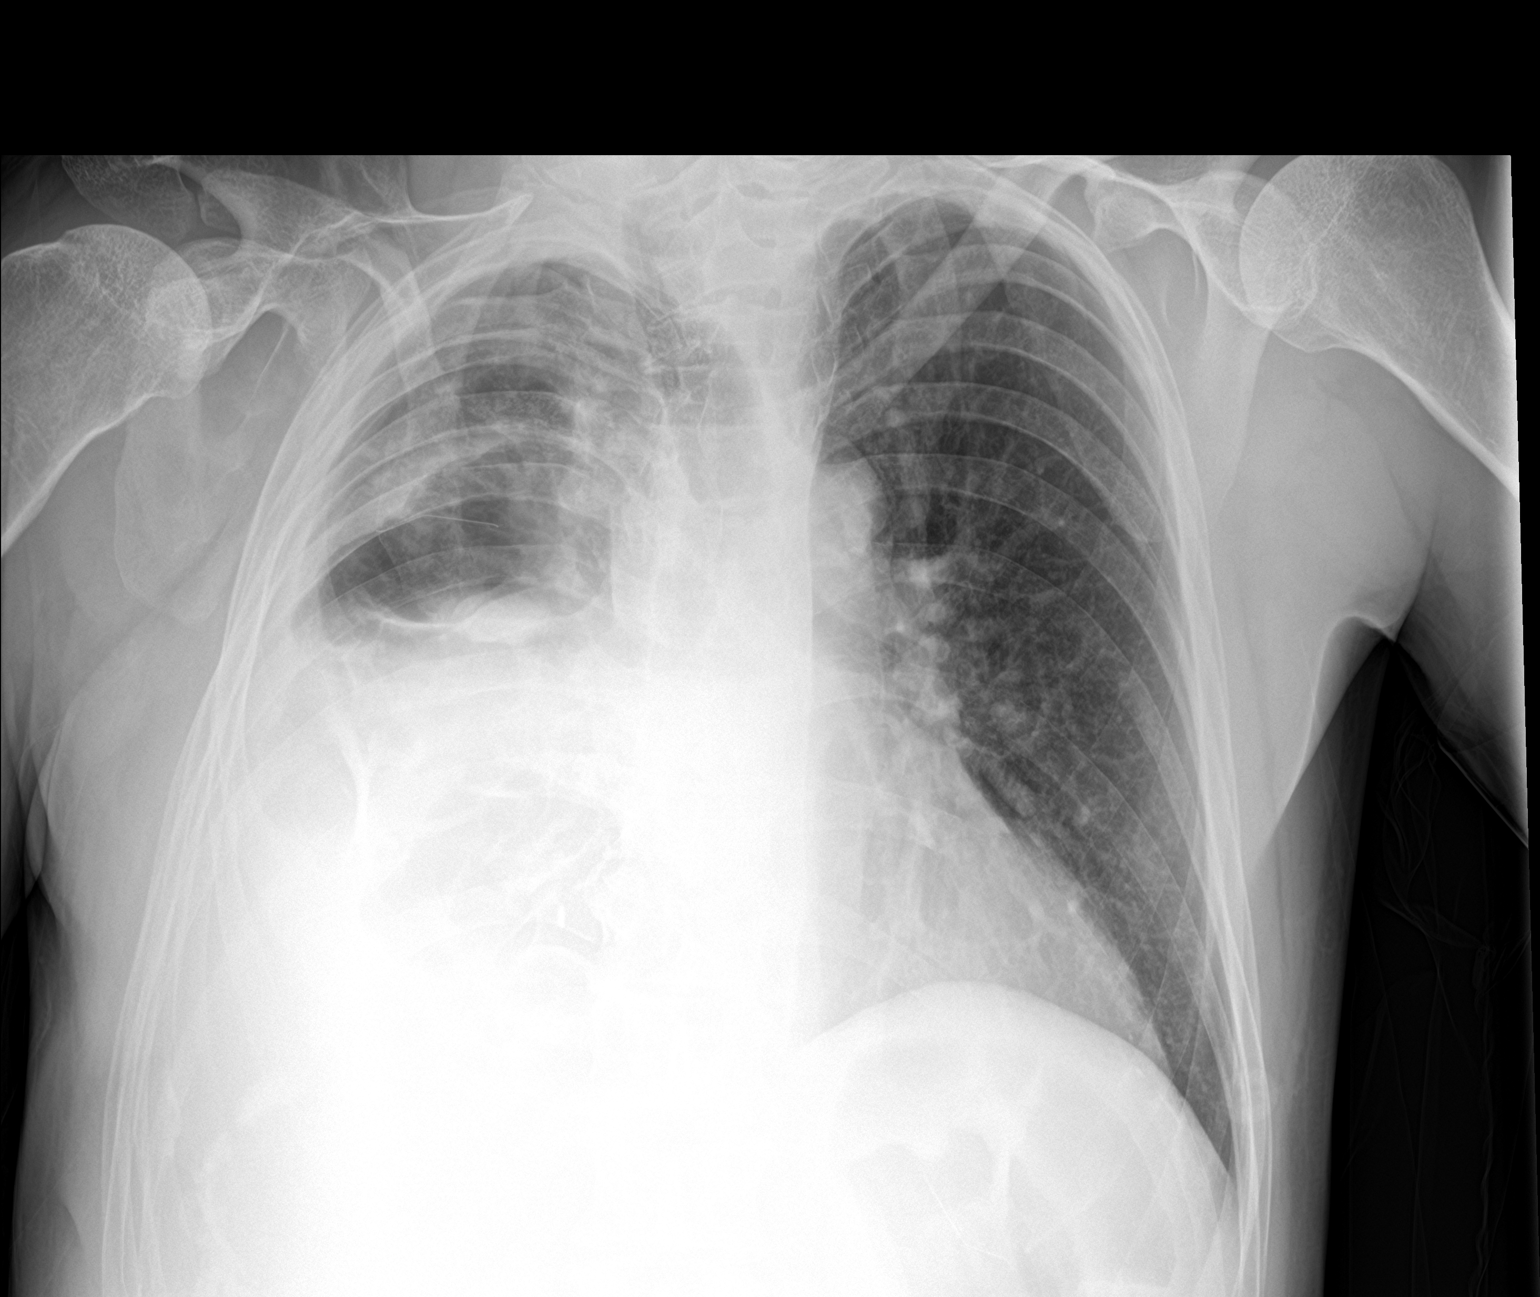

[2 of 2 positions shown; findings below may reference images not displayed]

FINDINGS: There is increasing opacity in the right mid lower lung. There is an
air-fluid level posteriorly on the lateral view which is a new
finding. There appears to be atelectasis adjacent to the superior
aspect of this air-fluid level. The patient's colonic interposition
graft is noted to be anteriorly located based on previous CT imaging
and this is well appreciated on the lateral view. The air-fluid
level is located posteriorly with no correlate on recent CT imaging.
The left lung is clear. The cardiomediastinal silhouette is stable.
No other interval changes.
IMPRESSION: 1. There is a large air-fluid level located posteriorly on the
lateral view, likely accounting for much of the increased opacity in
the right mid and lower lung. There is no correlate for this finding
on the previous chest x-ray or CT scan. Recommend a CT scan of the
chest for further evaluation.
2. The patient's interposed colonic graft is seen anteriorly on the
lateral view, similar in the interval.
3. No other changes.

## 2019-09-06 IMAGING — CT CT CHEST WITH CONTRAST
2 series · 15 of 35 positions shown, 18 images · IV contrast (omnipaque)
Comparison: Chest radiograph of earlier today. Prior CT of
06/05/2019.

CLINICAL DATA: Right-sided chest pain for 1 week. Cough. History of
congenital tracheoesophageal fistula with colonic interposition
between proximal esophagus and stomach.

EXAM:
CT CHEST WITH CONTRAST
TECHNIQUE: Multidetector CT imaging of the chest was performed during
intravenous contrast administration.
CONTRAST:  75mL OMNIPAQUE IOHEXOL 300 MG/ML  SOLN

[Series 2: routine chest with · axial · 0.66mm/px · z∈[-30,+224]mm · 12 of 151 slices shown, 15 images]
[im 12/151  mediastinal]
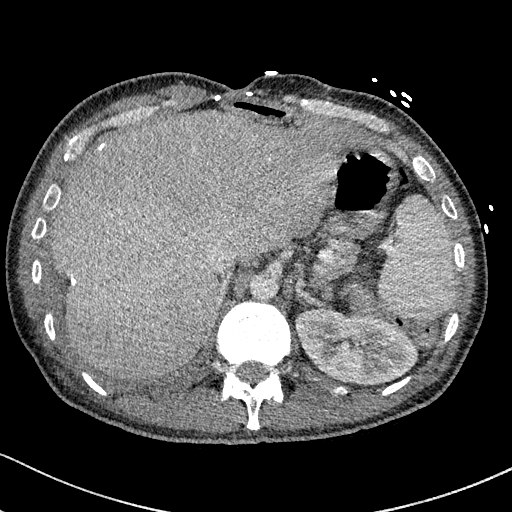
[im 12/151  lung]
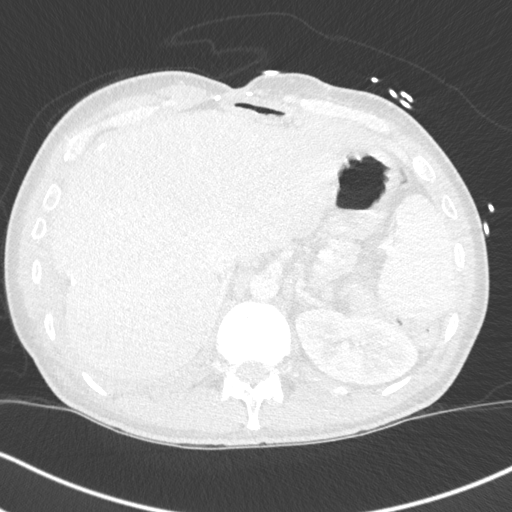
[im 23/151  lung]
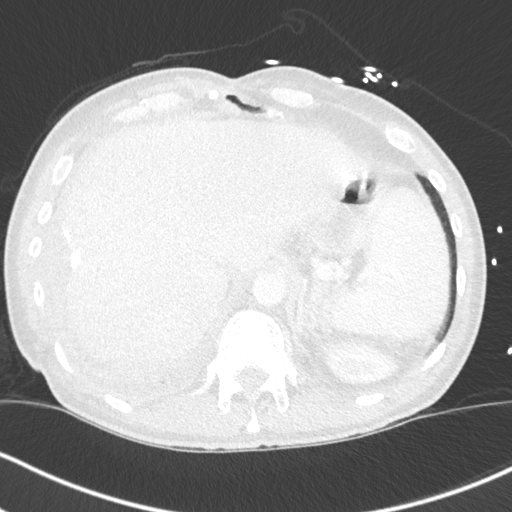
[im 34/151  lung]
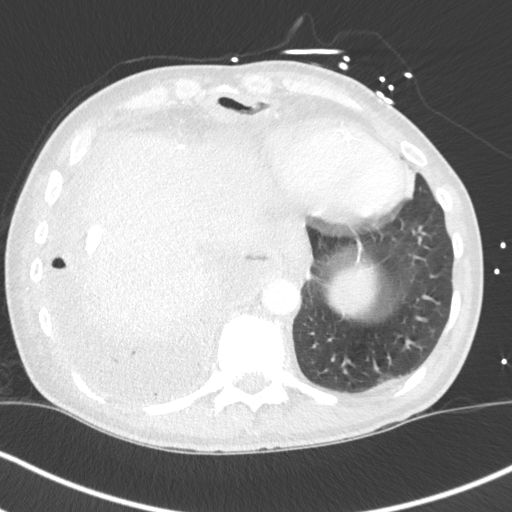
[im 45/151  lung]
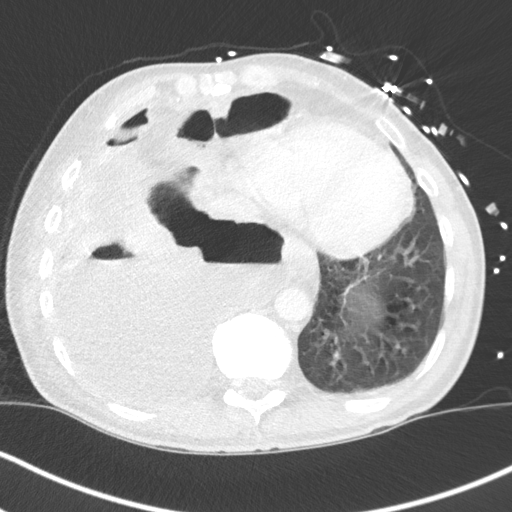
[im 62/151  mediastinal]
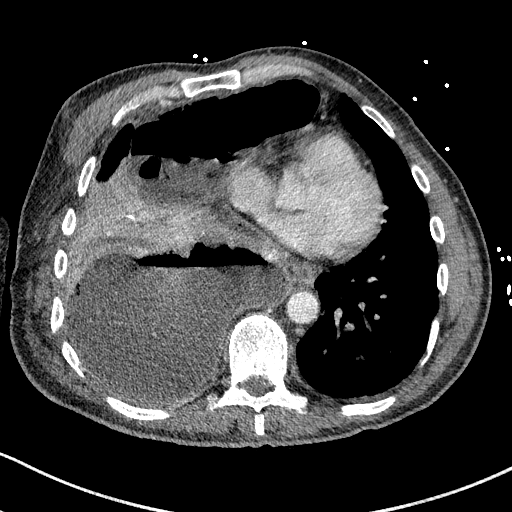
[im 62/151  lung]
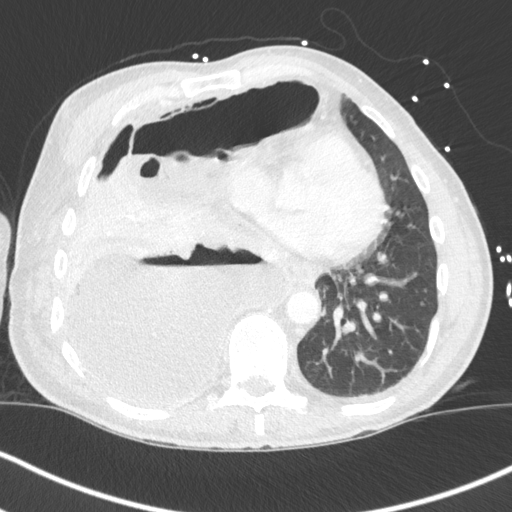
[im 73/151  lung]
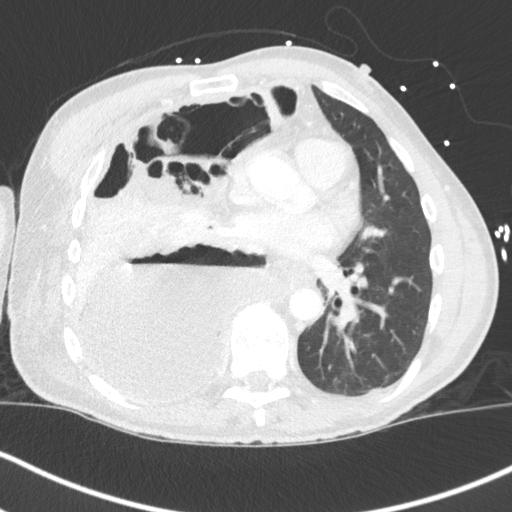
[im 78/151  lung]
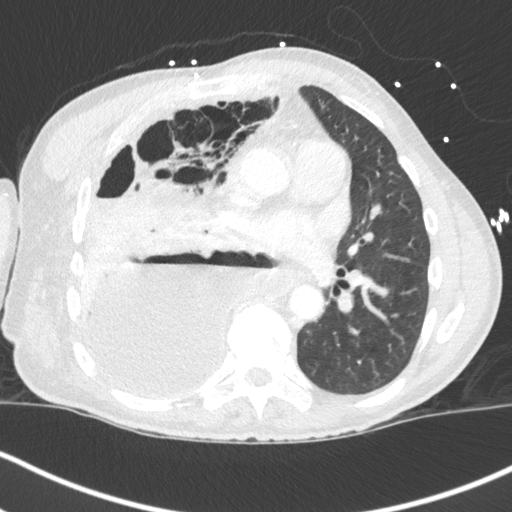
[im 89/151  lung]
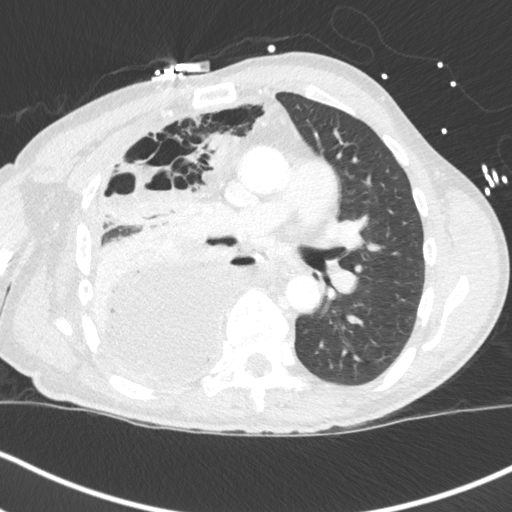
[im 106/151  mediastinal]
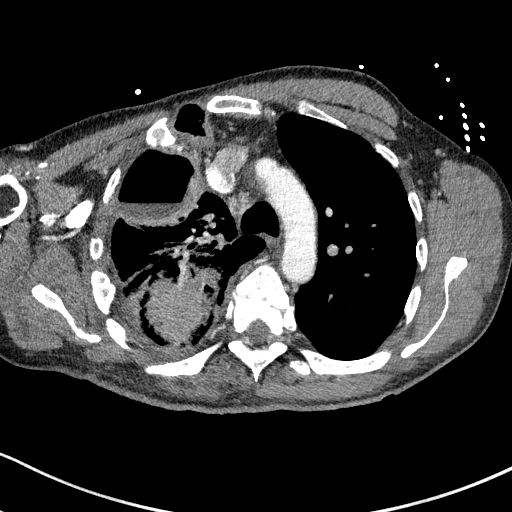
[im 106/151  lung]
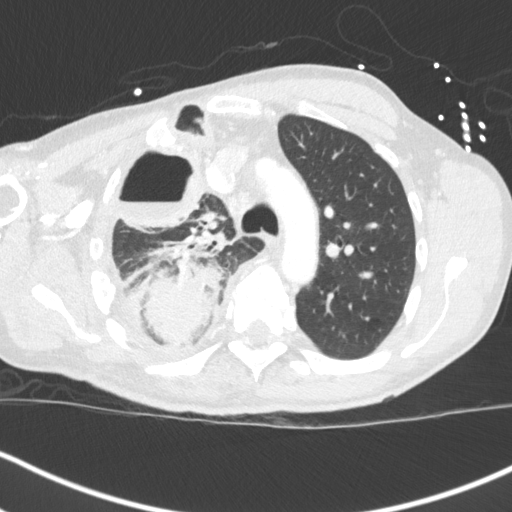
[im 117/151  lung]
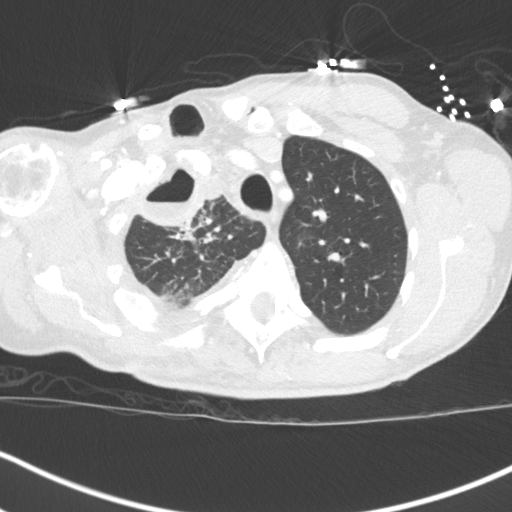
[im 128/151  lung]
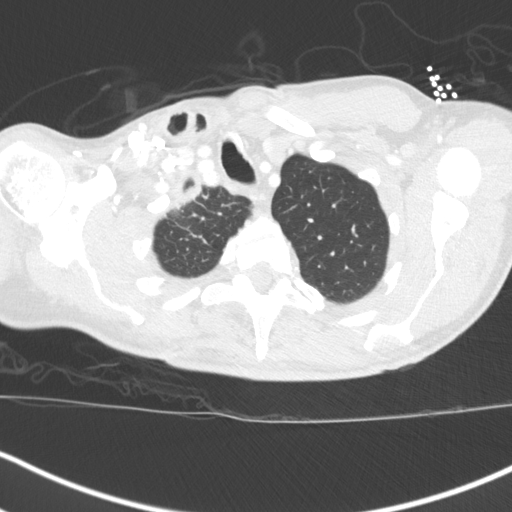
[im 139/151  lung]
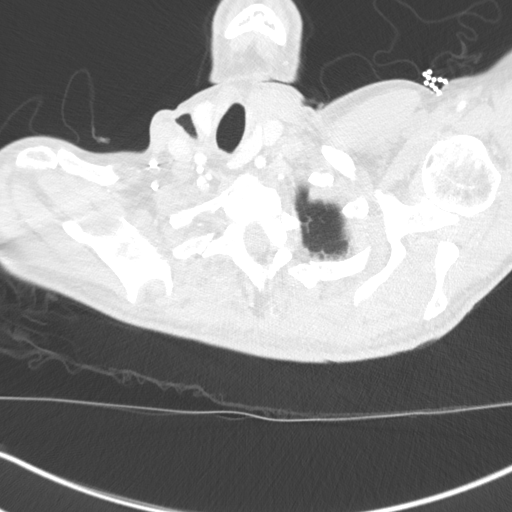

[Series 5: coronal · coronal · 0.62mm/px · 3 of 134 slices shown]
[im 27/134  lung]
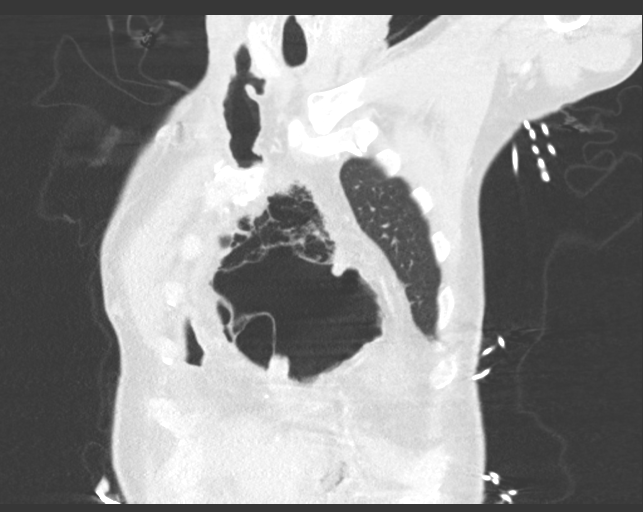
[im 54/134  lung]
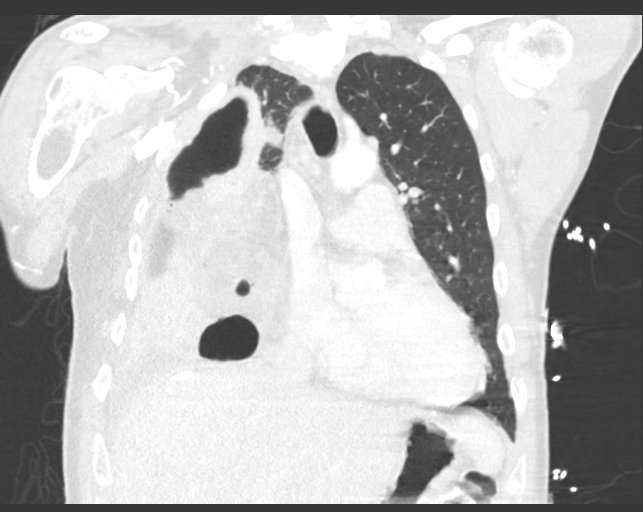
[im 80/134  lung]
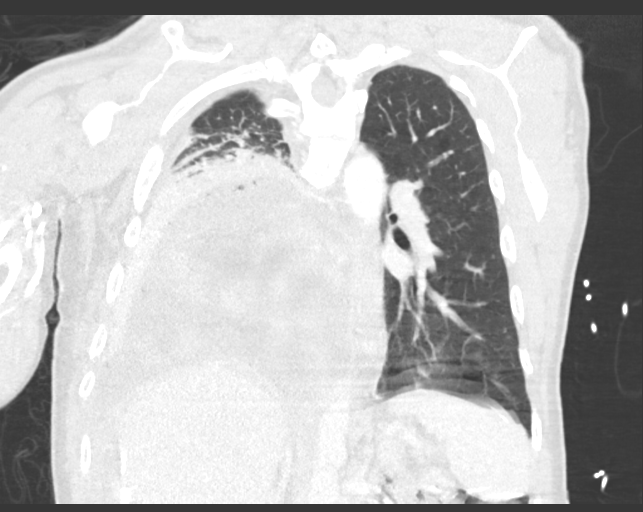

[15 of 35 positions shown; findings below may reference images not displayed]

FINDINGS: Cardiovascular: Normal caliber of the aorta and branch vessels.
Normal heart size, without pericardial effusion. Again identified is
a metallic object in the right ventricular apex which likely relates
to a limb of an IVC filter.

No central pulmonary embolism, on this non-dedicated study.

Mediastinum/Nodes: Patient is status post colonic interposition.
Beginning at the level of the upper chest, there is ill definition
of the interposition graft wall, likely related to colonic
pneumatosis. Example image 66/2. At the level of the lower chest,
there is wide-mouth communication with the right hydropneumothorax,
including on image 100/2.

Right paratracheal node is enlarged today at 9 mm, likely reactive.
Subcarinal node is also borderline enlarged at 1.2 cm, new since the
prior.

Lungs/Pleura: Development of a large right-sided hydropneumothorax
posteriorly and primarily inferiorly, including on 78/2.

The right lung is compressed by the hydropneumothorax and the
anterior right Thoracic process. Clear left lung.

Upper Abdomen: The distal most portion of the colonic interposition
is relatively normal in appearance, but the anastomosis with the
gastric body is not imaged. Calcifications along the right hepatic
capsule are unchanged. Normal imaged portions of the spleen,
stomach, adrenal glands, left kidney. Motion and artifact
degradation degrades evaluation of the upper abdomen.

Musculoskeletal: Developmental or posttraumatic deformity involving
the right scapula. Mid and lower thoracic spondylosis.
IMPRESSION: 1. Status post colonic interposition secondary to congenital
tracheoesophageal fistula. Interval development of gas throughout
the course of the interposition, without well-defined wall, most
consistent with colonic ischemia and graft breakdown. Direct
communication of the colon with the right pleural space, resulting
in a large right-sided hydropneumothorax.
2. Right lung atelectasis secondary to colonic interposition and
hydropneumothorax.
3. Probable retained limb of an IVC filter in the right ventricle,
as before.

## 2019-09-10 IMAGING — US ULTRASOUND ABDOMEN LIMITED
1 series · 14 of 25 positions shown · non-contrast
Comparison: CT of the abdomen pelvis on [DATE]

CLINICAL DATA: RIGHT UPPER QUADRANT and chest pain after eating
greasy foods. Symptoms for 2 weeks. Symptoms worse yesterday.

EXAM:
ULTRASOUND ABDOMEN LIMITED RIGHT UPPER QUADRANT

[Series 1: ultrasound abdomen limited · 0.15mm/px · 14 of 50 slices shown]
[im 1/50]
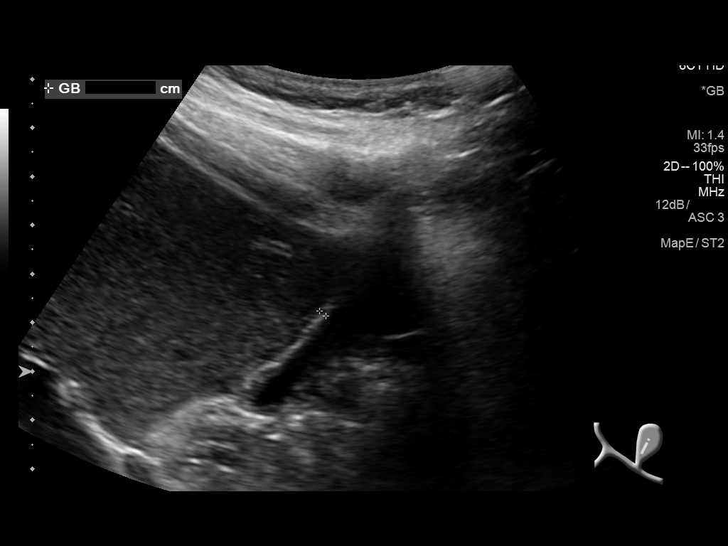
[im 5/50]
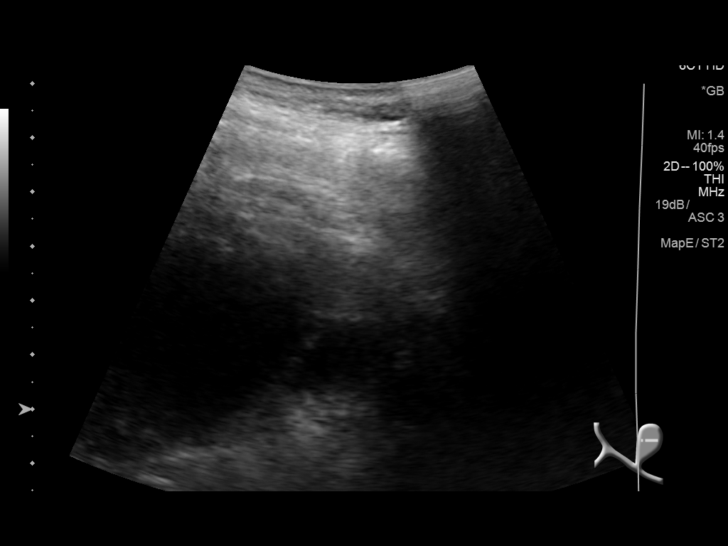
[im 9/50]
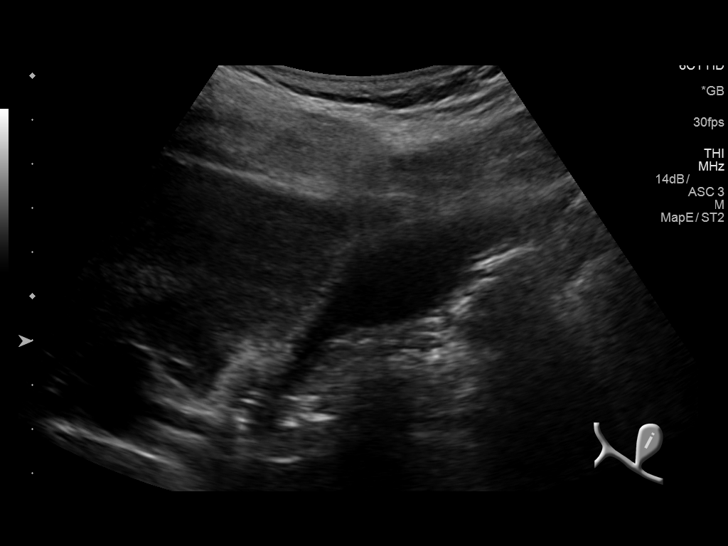
[im 13/50]
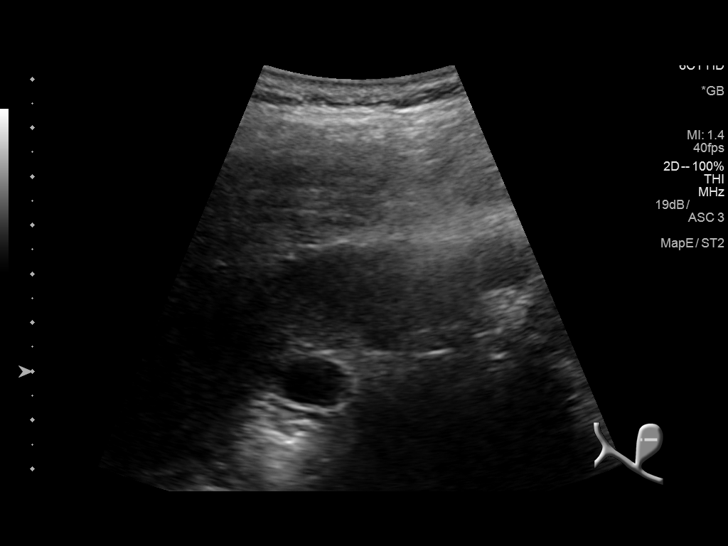
[im 17/50]
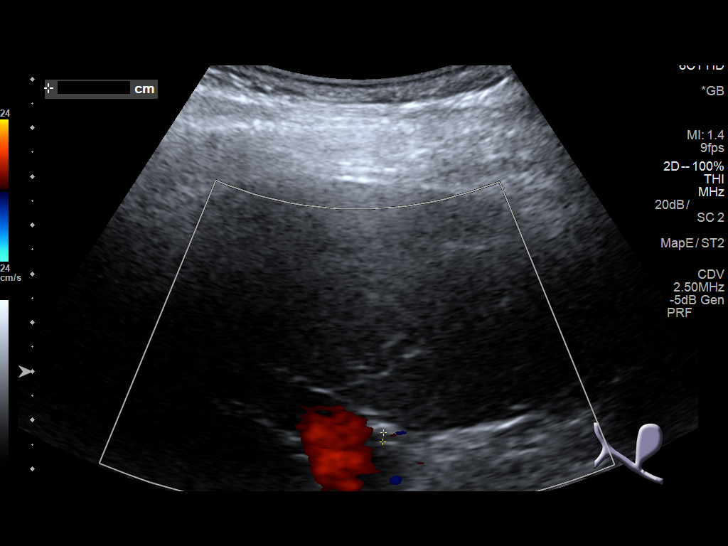
[im 19/50]
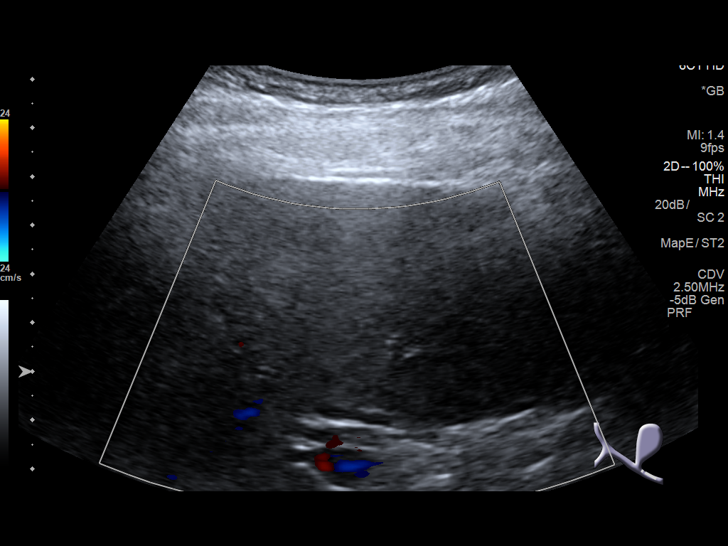
[im 23/50]
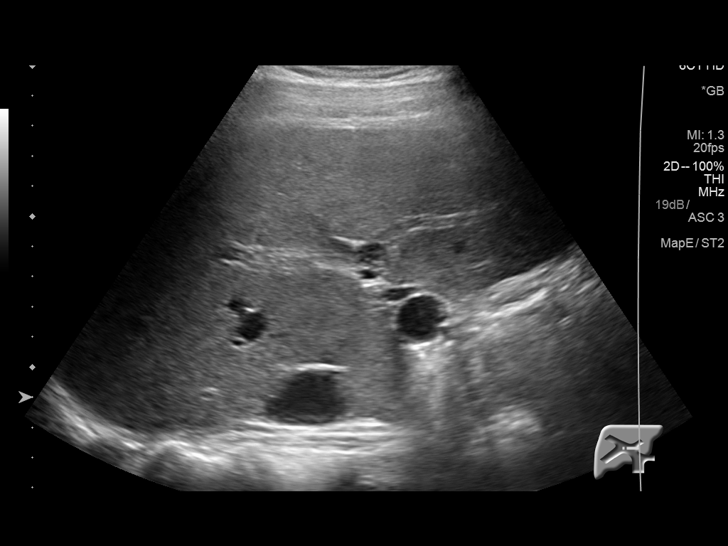
[im 27/50]
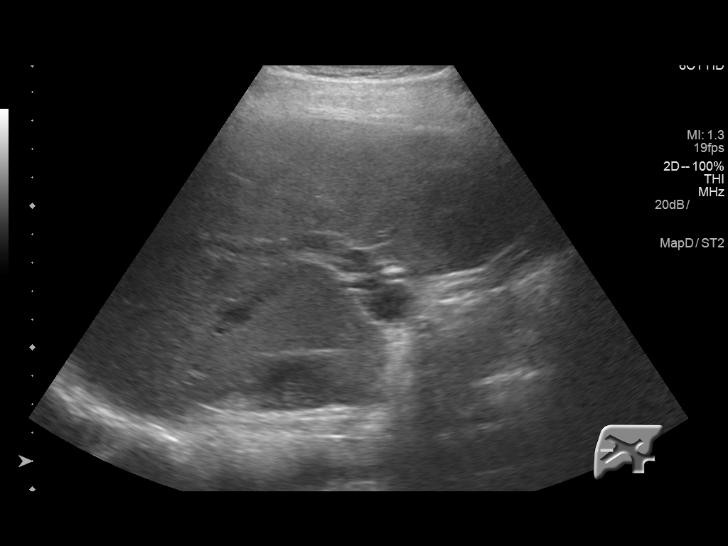
[im 31/50]
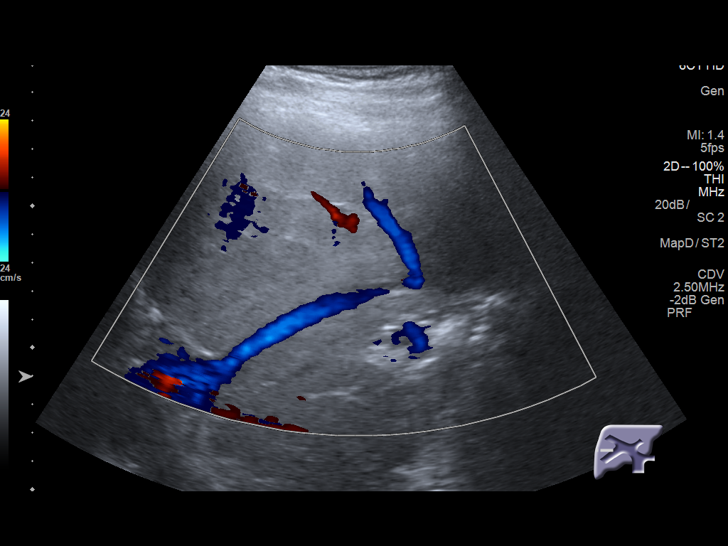
[im 33/50]
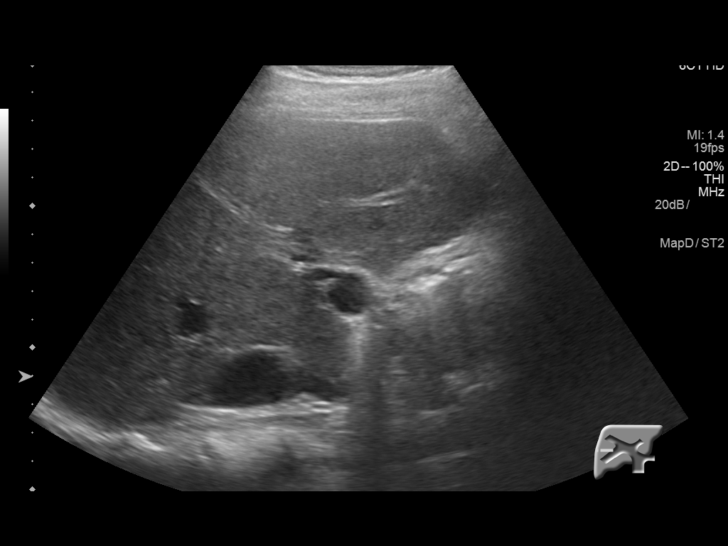
[im 37/50]
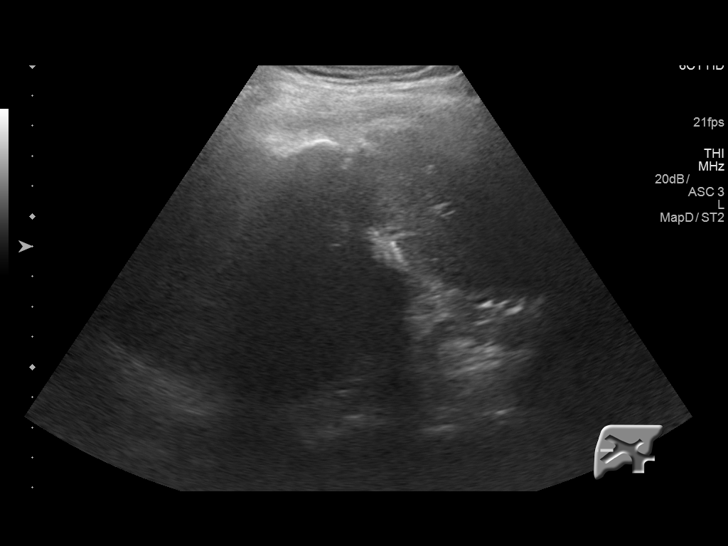
[im 41/50]
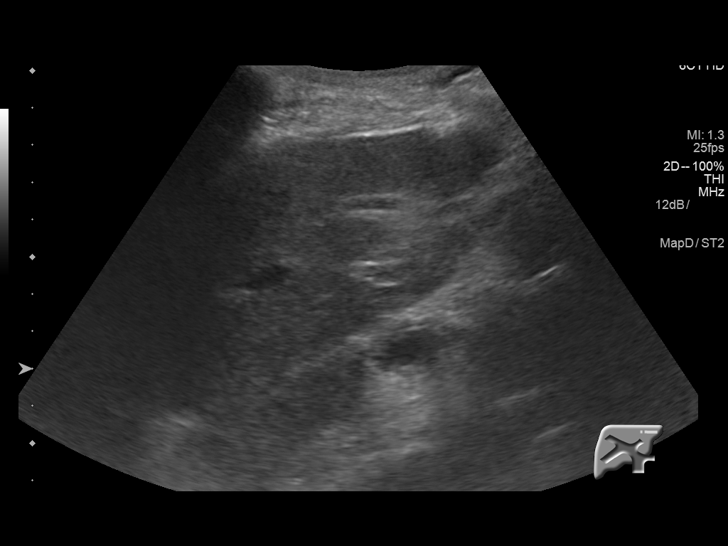
[im 45/50]
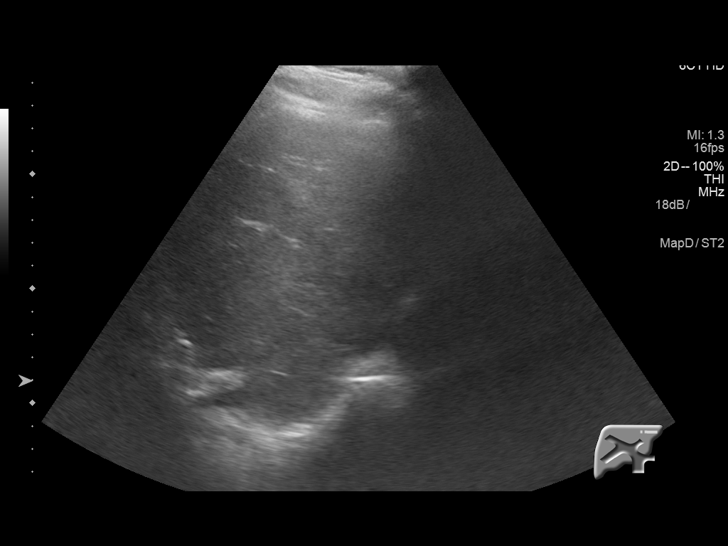
[im 50/50]
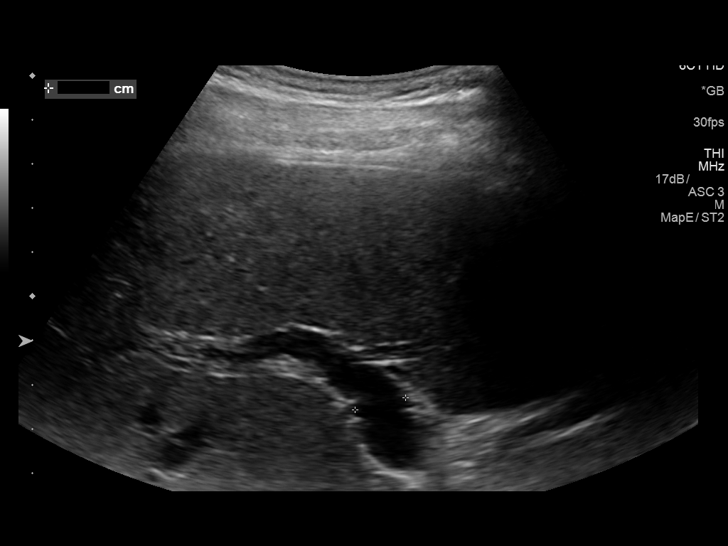

[14 of 25 positions shown; findings below may reference images not displayed]

FINDINGS: Gallbladder:

Gallbladder has a normal appearance. Gallbladder wall is
millimeters no sonographic Murphy sign or stones., Within normal
limits. No stones or pericholecystic fluid. No sonographic Murphy's
sign.

Common bile duct:

Diameter: 2.5 millimeter

Liver:

No focal liver lesion identified. There are calcifications in the
RIGHT UPPER QUADRANT, partially limiting evaluation of the liver.

Portal vein is patent on color Doppler imaging with normal direction
of blood flow towards the liver.
IMPRESSION: 1. No evidence for acute cholecystitis.
2. Normal appearance of the liver; RIGHT UPPER QUADRANT
calcifications again noted.

## 2019-09-15 ENCOUNTER — Other Ambulatory Visit: Payer: Self-pay

## 2019-09-15 DIAGNOSIS — Z20822 Contact with and (suspected) exposure to covid-19: Secondary | ICD-10-CM

## 2019-09-17 LAB — NOVEL CORONAVIRUS, NAA: SARS-CoV-2, NAA: NOT DETECTED

## 2019-10-03 ENCOUNTER — Ambulatory Visit (INDEPENDENT_AMBULATORY_CARE_PROVIDER_SITE_OTHER): Payer: Medicaid Other | Admitting: Internal Medicine

## 2019-11-15 ENCOUNTER — Encounter (HOSPITAL_COMMUNITY): Payer: Self-pay | Admitting: *Deleted

## 2019-11-15 ENCOUNTER — Emergency Department (HOSPITAL_COMMUNITY)
Admission: EM | Admit: 2019-11-15 | Discharge: 2019-11-15 | Disposition: A | Discharge status: Home / Self Care | Payer: Medicaid Other | Attending: Emergency Medicine | Admitting: Emergency Medicine

## 2019-11-15 ENCOUNTER — Other Ambulatory Visit: Payer: Self-pay

## 2019-11-15 DIAGNOSIS — Z87891 Personal history of nicotine dependence: Secondary | ICD-10-CM | POA: Insufficient documentation

## 2019-11-15 DIAGNOSIS — Z79899 Other long term (current) drug therapy: Secondary | ICD-10-CM | POA: Insufficient documentation

## 2019-11-15 DIAGNOSIS — R109 Unspecified abdominal pain: Secondary | ICD-10-CM | POA: Insufficient documentation

## 2019-11-15 DIAGNOSIS — G8929 Other chronic pain: Secondary | ICD-10-CM | POA: Insufficient documentation

## 2019-11-15 DIAGNOSIS — Y69 Unspecified misadventure during surgical and medical care: Secondary | ICD-10-CM | POA: Diagnosis not present

## 2019-11-15 DIAGNOSIS — T85598A Other mechanical complication of other gastrointestinal prosthetic devices, implants and grafts, initial encounter: Secondary | ICD-10-CM | POA: Diagnosis not present

## 2019-11-15 HISTORY — DX: Presence of other specified devices: Z97.8

## 2019-11-15 NOTE — ED Triage Notes (Signed)
Pt c/o feeing tube being stopped up since last night.
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
## 2019-11-15 NOTE — Discharge Instructions (Signed)
Please do your feedings as scheduled.  Follow-up with primary care as needed for continued valuation.  Return to ED immediately for any worsening symptoms or concerns such as repeat clogged feeding tubes or new or worsening abdominal pain.

## 2019-11-18 ENCOUNTER — Encounter (HOSPITAL_COMMUNITY): Payer: Self-pay | Admitting: Emergency Medicine

## 2019-11-18 ENCOUNTER — Other Ambulatory Visit: Payer: Self-pay

## 2019-11-18 ENCOUNTER — Emergency Department (HOSPITAL_COMMUNITY)
Admission: EM | Admit: 2019-11-18 | Discharge: 2019-11-18 | Disposition: A | Discharge status: Other Acute Inpatient Hospital | Payer: Medicaid Other | Attending: Emergency Medicine | Admitting: Emergency Medicine

## 2019-11-18 DIAGNOSIS — T85598A Other mechanical complication of other gastrointestinal prosthetic devices, implants and grafts, initial encounter: Secondary | ICD-10-CM | POA: Diagnosis present

## 2019-11-18 DIAGNOSIS — Y731 Therapeutic (nonsurgical) and rehabilitative gastroenterology and urology devices associated with adverse incidents: Secondary | ICD-10-CM | POA: Diagnosis not present

## 2019-11-18 DIAGNOSIS — E870 Hyperosmolality and hypernatremia: Secondary | ICD-10-CM | POA: Diagnosis not present

## 2019-11-18 DIAGNOSIS — Z79899 Other long term (current) drug therapy: Secondary | ICD-10-CM | POA: Diagnosis not present

## 2019-11-18 DIAGNOSIS — F17200 Nicotine dependence, unspecified, uncomplicated: Secondary | ICD-10-CM | POA: Diagnosis not present

## 2019-11-18 LAB — CBC WITH DIFFERENTIAL/PLATELET
Abs Immature Granulocytes: 0.02 10*3/uL (ref 0.00–0.07)
Basophils Absolute: 0.1 10*3/uL (ref 0.0–0.1)
Basophils Relative: 1 %
Eosinophils Absolute: 0 10*3/uL (ref 0.0–0.5)
Eosinophils Relative: 1 %
HCT: 49.9 % (ref 39.0–52.0)
Hemoglobin: 14.5 g/dL (ref 13.0–17.0)
Immature Granulocytes: 0 %
Lymphocytes Relative: 22 %
Lymphs Abs: 1.6 10*3/uL (ref 0.7–4.0)
MCH: 25.1 pg — ABNORMAL LOW (ref 26.0–34.0)
MCHC: 29.1 g/dL — ABNORMAL LOW (ref 30.0–36.0)
MCV: 86.5 fL (ref 80.0–100.0)
Monocytes Absolute: 0.6 10*3/uL (ref 0.1–1.0)
Monocytes Relative: 8 %
Neutro Abs: 5.1 10*3/uL (ref 1.7–7.7)
Neutrophils Relative %: 68 %
Platelets: 221 10*3/uL (ref 150–400)
RBC: 5.77 MIL/uL (ref 4.22–5.81)
RDW: 20.5 % — ABNORMAL HIGH (ref 11.5–15.5)
WBC: 7.4 10*3/uL (ref 4.0–10.5)
nRBC: 0 % (ref 0.0–0.2)

## 2019-11-18 LAB — COMPREHENSIVE METABOLIC PANEL
ALT: 33 U/L (ref 0–44)
AST: 25 U/L (ref 15–41)
Albumin: 4.5 g/dL (ref 3.5–5.0)
Alkaline Phosphatase: 97 U/L (ref 38–126)
Anion gap: 12 (ref 5–15)
BUN: 31 mg/dL — ABNORMAL HIGH (ref 6–20)
CO2: 33 mmol/L — ABNORMAL HIGH (ref 22–32)
Calcium: 10.3 mg/dL (ref 8.9–10.3)
Chloride: 109 mmol/L (ref 98–111)
Creatinine, Ser: 1 mg/dL (ref 0.61–1.24)
GFR calc Af Amer: >60 mL/min (ref >60)
GFR calc non Af Amer: >60 mL/min (ref >60)
Glucose, Bld: 109 mg/dL — ABNORMAL HIGH (ref 70–99)
Potassium: 4 mmol/L (ref 3.5–5.1)
Sodium: 154 mmol/L — ABNORMAL HIGH (ref 135–145)
Total Bilirubin: 0.6 mg/dL (ref 0.3–1.2)
Total Protein: 8.1 g/dL (ref 6.5–8.1)

## 2019-11-18 MED ORDER — METRISET BURETTE SET MISC
Status: DC
Start: ? — End: 2019-11-18

## 2019-11-18 MED ORDER — CVS EAR DROPS OT
40.00 | OTIC | Status: DC
Start: 2019-11-21 — End: 2019-11-18

## 2019-11-18 MED ORDER — SODIUM CHLORIDE 0.9 % IV BOLUS
500.0000 mL | Freq: Once | INTRAVENOUS | Status: AC
  Administered 2019-11-18: 15:00:00 500 mL via INTRAVENOUS

## 2019-11-18 MED ORDER — CHLOROPHYLL EX
5.00 | CUTANEOUS | Status: DC
Start: 2019-11-18 — End: 2019-11-18

## 2019-11-18 MED ORDER — ISOVUE-M 300 61 % IJ SOLN
4.00 | INTRAMUSCULAR | Status: DC
Start: ? — End: 2019-11-18

## 2019-11-18 MED ORDER — DICLOXACILLIN SODIUM 62.5 MG/5ML PO SUSR
10.00 | ORAL | Status: DC
Start: ? — End: 2019-11-18

## 2019-11-18 NOTE — ED Notes (Signed)
ED Provider at bedside. 

## 2019-11-18 NOTE — ED Notes (Signed)
[facility] called and received report, ETA 30-35 minutes

## 2019-11-18 NOTE — ED Triage Notes (Signed)
Pt states feeding tube stopped up since last night.
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
## 2019-11-19 MED ORDER — LOVENOX 150 MG/ML ~~LOC~~ SOLN
0.20 | SUBCUTANEOUS | Status: DC
Start: ? — End: 2019-11-19

## 2019-11-19 MED ORDER — SB BRONCHIAL 12.5-200 MG PO TABS
25.00 | ORAL_TABLET | ORAL | Status: DC
Start: ? — End: 2019-11-19

## 2019-11-19 MED ORDER — AVELOX 400 MG PO TABS
1.00 | ORAL_TABLET | ORAL | Status: DC
Start: 2019-11-21 — End: 2019-11-19

## 2019-11-20 MED ORDER — ALBA-3 0.02-0.1-1 % OT LIQD
OTIC | Status: DC
Start: ? — End: 2019-11-20

## 2019-12-18 ENCOUNTER — Encounter (HOSPITAL_COMMUNITY): Payer: Self-pay | Admitting: Emergency Medicine

## 2019-12-18 ENCOUNTER — Inpatient Hospital Stay (HOSPITAL_COMMUNITY)
Admission: EM | Admit: 2019-12-18 | Discharge: 2019-12-22 | DRG: 640 | Disposition: A | Discharge status: Home Health | Payer: Medicaid Other | Attending: Internal Medicine | Admitting: Internal Medicine

## 2019-12-18 DIAGNOSIS — Q390 Atresia of esophagus without fistula: Secondary | ICD-10-CM

## 2019-12-18 DIAGNOSIS — Z8249 Family history of ischemic heart disease and other diseases of the circulatory system: Secondary | ICD-10-CM

## 2019-12-18 DIAGNOSIS — Z880 Allergy status to penicillin: Secondary | ICD-10-CM

## 2019-12-18 DIAGNOSIS — Z87891 Personal history of nicotine dependence: Secondary | ICD-10-CM

## 2019-12-18 DIAGNOSIS — Z20822 Contact with and (suspected) exposure to covid-19: Secondary | ICD-10-CM | POA: Diagnosis present

## 2019-12-18 DIAGNOSIS — E870 Hyperosmolality and hypernatremia: Secondary | ICD-10-CM | POA: Diagnosis not present

## 2019-12-18 DIAGNOSIS — Z934 Other artificial openings of gastrointestinal tract status: Secondary | ICD-10-CM

## 2019-12-18 DIAGNOSIS — Z681 Body mass index (BMI) 19 or less, adult: Secondary | ICD-10-CM

## 2019-12-18 DIAGNOSIS — R64 Cachexia: Secondary | ICD-10-CM | POA: Diagnosis present

## 2019-12-18 DIAGNOSIS — Z9049 Acquired absence of other specified parts of digestive tract: Secondary | ICD-10-CM

## 2019-12-18 DIAGNOSIS — E43 Unspecified severe protein-calorie malnutrition: Secondary | ICD-10-CM | POA: Diagnosis present

## 2019-12-18 DIAGNOSIS — E876 Hypokalemia: Secondary | ICD-10-CM | POA: Diagnosis present

## 2019-12-18 DIAGNOSIS — E860 Dehydration: Secondary | ICD-10-CM | POA: Diagnosis present

## 2019-12-18 LAB — COMPREHENSIVE METABOLIC PANEL
ALT: 42 U/L (ref 0–44)
AST: 27 U/L (ref 15–41)
Albumin: 4.3 g/dL (ref 3.5–5.0)
Alkaline Phosphatase: 98 U/L (ref 38–126)
Anion gap: 13 (ref 5–15)
BUN: 64 mg/dL — ABNORMAL HIGH (ref 6–20)
CO2: 39 mmol/L — ABNORMAL HIGH (ref 22–32)
Calcium: 10.7 mg/dL — ABNORMAL HIGH (ref 8.9–10.3)
Chloride: 122 mmol/L — ABNORMAL HIGH (ref 98–111)
Creatinine, Ser: 1.17 mg/dL (ref 0.61–1.24)
GFR calc Af Amer: >60 mL/min (ref >60)
GFR calc non Af Amer: >60 mL/min (ref >60)
Glucose, Bld: 112 mg/dL — ABNORMAL HIGH (ref 70–99)
Potassium: 3.9 mmol/L (ref 3.5–5.1)
Sodium: 174 mmol/L (ref 135–145)
Total Bilirubin: 0.6 mg/dL (ref 0.3–1.2)
Total Protein: 7.9 g/dL (ref 6.5–8.1)

## 2019-12-18 LAB — CBC WITH DIFFERENTIAL/PLATELET
Abs Immature Granulocytes: 0.03 10*3/uL (ref 0.00–0.07)
Basophils Absolute: 0.1 10*3/uL (ref 0.0–0.1)
Basophils Relative: 1 %
Eosinophils Absolute: 0.1 10*3/uL (ref 0.0–0.5)
Eosinophils Relative: 1 %
HCT: 58 % — ABNORMAL HIGH (ref 39.0–52.0)
Hemoglobin: 16 g/dL (ref 13.0–17.0)
Immature Granulocytes: 0 %
Lymphocytes Relative: 15 %
Lymphs Abs: 1.6 10*3/uL (ref 0.7–4.0)
MCH: 26.4 pg (ref 26.0–34.0)
MCHC: 27.6 g/dL — ABNORMAL LOW (ref 30.0–36.0)
MCV: 95.6 fL (ref 80.0–100.0)
Monocytes Absolute: 1 10*3/uL (ref 0.1–1.0)
Monocytes Relative: 9 %
Neutro Abs: 7.8 10*3/uL — ABNORMAL HIGH (ref 1.7–7.7)
Neutrophils Relative %: 74 %
Platelets: 145 10*3/uL — ABNORMAL LOW (ref 150–400)
RBC: 6.07 MIL/uL — ABNORMAL HIGH (ref 4.22–5.81)
RDW: 18.1 % — ABNORMAL HIGH (ref 11.5–15.5)
WBC: 10.6 10*3/uL — ABNORMAL HIGH (ref 4.0–10.5)
nRBC: 0 % (ref 0.0–0.2)

## 2019-12-18 MED ORDER — DEXTROSE 5 % IV SOLN: INTRAVENOUS | Status: DC

## 2019-12-18 MED ORDER — SODIUM CHLORIDE 0.9 % IV BOLUS
1000.0000 mL | Freq: Once | INTRAVENOUS | Status: AC
  Administered 2019-12-18: 1000 mL via INTRAVENOUS

## 2019-12-18 NOTE — ED Notes (Signed)
Pt given water per PA approval. °
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
## 2019-12-18 NOTE — ED Notes (Signed)
CRITICAL VALUE ALERT  Critical Value:  NA 174  Date & Time Notied:  12/18/19  Provider Notified: Binnie Rail, PA-C Orders Received/Actions taken: None yet
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
## 2019-12-18 NOTE — ED Triage Notes (Signed)
Pt reports tube feeding stopped up for 2 hours  Desires to be transferred to [facility] because "his car is broken down"  Caregiver at home                  bh
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
## 2019-12-18 NOTE — ED Notes (Signed)
Patient given water and apple juice at this time.

## 2019-12-19 ENCOUNTER — Other Ambulatory Visit: Payer: Self-pay

## 2019-12-19 DIAGNOSIS — Z880 Allergy status to penicillin: Secondary | ICD-10-CM | POA: Diagnosis not present

## 2019-12-19 DIAGNOSIS — Z681 Body mass index (BMI) 19 or less, adult: Secondary | ICD-10-CM | POA: Diagnosis not present

## 2019-12-19 DIAGNOSIS — E43 Unspecified severe protein-calorie malnutrition: Secondary | ICD-10-CM | POA: Diagnosis present

## 2019-12-19 DIAGNOSIS — Z9049 Acquired absence of other specified parts of digestive tract: Secondary | ICD-10-CM | POA: Diagnosis not present

## 2019-12-19 DIAGNOSIS — Z931 Gastrostomy status: Secondary | ICD-10-CM | POA: Diagnosis not present

## 2019-12-19 DIAGNOSIS — E860 Dehydration: Secondary | ICD-10-CM | POA: Diagnosis present

## 2019-12-19 DIAGNOSIS — E870 Hyperosmolality and hypernatremia: Principal | ICD-10-CM

## 2019-12-19 DIAGNOSIS — Q390 Atresia of esophagus without fistula: Secondary | ICD-10-CM | POA: Diagnosis not present

## 2019-12-19 DIAGNOSIS — Z8249 Family history of ischemic heart disease and other diseases of the circulatory system: Secondary | ICD-10-CM | POA: Diagnosis not present

## 2019-12-19 DIAGNOSIS — Z934 Other artificial openings of gastrointestinal tract status: Secondary | ICD-10-CM | POA: Diagnosis not present

## 2019-12-19 DIAGNOSIS — Z20822 Contact with and (suspected) exposure to covid-19: Secondary | ICD-10-CM | POA: Diagnosis present

## 2019-12-19 DIAGNOSIS — E876 Hypokalemia: Secondary | ICD-10-CM | POA: Diagnosis present

## 2019-12-19 DIAGNOSIS — Z87891 Personal history of nicotine dependence: Secondary | ICD-10-CM | POA: Diagnosis not present

## 2019-12-19 DIAGNOSIS — R64 Cachexia: Secondary | ICD-10-CM | POA: Diagnosis present

## 2019-12-19 LAB — BASIC METABOLIC PANEL
Anion gap: 9 (ref 5–15)
Anion gap: 6 (ref 5–15)
Anion gap: 7 (ref 5–15)
Anion gap: 7 (ref 5–15)
BUN: 57 mg/dL — ABNORMAL HIGH (ref 6–20)
BUN: 55 mg/dL — ABNORMAL HIGH (ref 6–20)
BUN: 51 mg/dL — ABNORMAL HIGH (ref 6–20)
BUN: 48 mg/dL — ABNORMAL HIGH (ref 6–20)
CO2: 36 mmol/L — ABNORMAL HIGH (ref 22–32)
CO2: 38 mmol/L — ABNORMAL HIGH (ref 22–32)
CO2: 35 mmol/L — ABNORMAL HIGH (ref 22–32)
CO2: 34 mmol/L — ABNORMAL HIGH (ref 22–32)
Calcium: 9.6 mg/dL (ref 8.9–10.3)
Calcium: 9.7 mg/dL (ref 8.9–10.3)
Calcium: 9.8 mg/dL (ref 8.9–10.3)
Calcium: 9.7 mg/dL (ref 8.9–10.3)
Chloride: 127 mmol/L — ABNORMAL HIGH (ref 98–111)
Chloride: 125 mmol/L — ABNORMAL HIGH (ref 98–111)
Chloride: 125 mmol/L — ABNORMAL HIGH (ref 98–111)
Chloride: 123 mmol/L — ABNORMAL HIGH (ref 98–111)
Creatinine, Ser: 1.12 mg/dL (ref 0.61–1.24)
Creatinine, Ser: 1.19 mg/dL (ref 0.61–1.24)
Creatinine, Ser: 1.05 mg/dL (ref 0.61–1.24)
Creatinine, Ser: 1.06 mg/dL (ref 0.61–1.24)
GFR calc Af Amer: >60 mL/min (ref >60)
GFR calc Af Amer: >60 mL/min (ref >60)
GFR calc Af Amer: >60 mL/min (ref >60)
GFR calc Af Amer: >60 mL/min (ref >60)
GFR calc non Af Amer: >60 mL/min (ref >60)
GFR calc non Af Amer: >60 mL/min (ref >60)
GFR calc non Af Amer: >60 mL/min (ref >60)
GFR calc non Af Amer: >60 mL/min (ref >60)
Glucose, Bld: 121 mg/dL — ABNORMAL HIGH (ref 70–99)
Glucose, Bld: 116 mg/dL — ABNORMAL HIGH (ref 70–99)
Glucose, Bld: 111 mg/dL — ABNORMAL HIGH (ref 70–99)
Glucose, Bld: 101 mg/dL — ABNORMAL HIGH (ref 70–99)
Potassium: 3.4 mmol/L — ABNORMAL LOW (ref 3.5–5.1)
Potassium: 3.2 mmol/L — ABNORMAL LOW (ref 3.5–5.1)
Potassium: 3.6 mmol/L (ref 3.5–5.1)
Potassium: 3.2 mmol/L — ABNORMAL LOW (ref 3.5–5.1)
Sodium: 172 mmol/L (ref 135–145)
Sodium: 169 mmol/L (ref 135–145)
Sodium: 167 mmol/L (ref 135–145)
Sodium: 164 mmol/L (ref 135–145)

## 2019-12-19 LAB — HIV ANTIBODY (ROUTINE TESTING W REFLEX): HIV Screen 4th Generation wRfx: NONREACTIVE

## 2019-12-19 LAB — SARS CORONAVIRUS 2 (TAT 6-24 HRS): SARS Coronavirus 2: NEGATIVE

## 2019-12-19 MED ORDER — POTASSIUM CHLORIDE 10 MEQ/100ML IV SOLN
10.0000 meq | INTRAVENOUS | Status: AC
  Administered 2019-12-19 – 2019-12-20 (×4): 10 meq via INTRAVENOUS
  Filled 2019-12-19 (×4): qty 100

## 2019-12-19 MED ORDER — ACETAMINOPHEN 650 MG RE SUPP: 650.0000 mg | Freq: Four times a day (QID) | RECTAL | Status: DC | PRN

## 2019-12-19 MED ORDER — TAMSULOSIN HCL 0.4 MG PO CAPS
0.4000 mg | ORAL_CAPSULE | Freq: Every day | ORAL | Status: DC
  Administered 2019-12-19 – 2019-12-21 (×4): 0.4 mg via ORAL
  Filled 2019-12-19 (×4): qty 1

## 2019-12-19 MED ORDER — OXYCODONE HCL 5 MG PO TABS
5.0000 mg | ORAL_TABLET | Freq: Three times a day (TID) | ORAL | Status: DC | PRN
  Administered 2019-12-19 – 2019-12-22 (×10): 5 mg via ORAL
  Filled 2019-12-19 (×10): qty 1

## 2019-12-19 MED ORDER — TRAZODONE HCL 50 MG PO TABS
50.0000 mg | ORAL_TABLET | Freq: Every day | ORAL | Status: DC
  Administered 2019-12-19 – 2019-12-21 (×4): 50 mg via ORAL
  Filled 2019-12-19 (×4): qty 1

## 2019-12-19 MED ORDER — POLYETHYLENE GLYCOL 3350 17 G PO PACK: 17.0000 g | PACK | Freq: Every day | ORAL | Status: DC | PRN

## 2019-12-19 MED ORDER — ONDANSETRON HCL 4 MG PO TABS: 4.0000 mg | ORAL_TABLET | Freq: Four times a day (QID) | ORAL | Status: DC | PRN

## 2019-12-19 MED ORDER — ACETAMINOPHEN 325 MG PO TABS: 650.0000 mg | ORAL_TABLET | Freq: Four times a day (QID) | ORAL | Status: DC | PRN

## 2019-12-19 MED ORDER — NAPHAZOLINE-PHENIRAMINE 0.025-0.3 % OP SOLN
1.0000 [drp] | Freq: Four times a day (QID) | OPHTHALMIC | Status: DC | PRN
  Administered 2019-12-20: 1 [drp] via OPHTHALMIC
  Filled 2019-12-19: qty 5

## 2019-12-19 MED ORDER — ONDANSETRON HCL 4 MG/2ML IJ SOLN: 4.0000 mg | Freq: Four times a day (QID) | INTRAMUSCULAR | Status: DC | PRN

## 2019-12-19 MED ORDER — HEPARIN SODIUM (PORCINE) 5000 UNIT/ML IJ SOLN
5000.0000 [IU] | Freq: Three times a day (TID) | INTRAMUSCULAR | Status: DC
  Filled 2019-12-19 (×3): qty 1

## 2019-12-19 NOTE — Progress Notes (Signed)
Patient sodium down to 169 this am.  Patient stable.  Will continue to monitor patient.

## 2019-12-19 NOTE — Progress Notes (Signed)
Pt complains of very dry mouth. He said that is a chronic issue for him. He states that at home he drinks water and juices around the clock. He has a juice jug that he uses to dump all of what goes in his mouth and comes out of his stoma. Pt very concerned about breakfast. He said he wants a liquid breakfast. Adv that I would let his dayshift nurse know. Will continue to monitor patients as the night progress. Pt has continuously called for drinks.

## 2019-12-19 NOTE — Progress Notes (Signed)
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  He has undergone a retrosternal colonic interposition for esophageal atresia with partial esophageal resection and creation of a spit fistula.  He also has a GJ tube.  In the emergency room he was noted to be severely hypernatremic with a serum sodium of 174.  He was admitted for further hydration.   Assessment & Plan:   Active Problems:   Hypernatremia   1. Hypernatremia.  Secondary to dehydration.  Sodium 134 on admission.  Started on hypotonic fluids with D5W.  Serum sodium is trending down.  Would continue with current treatments. 2. History of esophageal atresia.  Complicated surgical history.  He has been followed at  tube.  Patient is tube dependent for nutrition.  Will request dietitian input for tube feeds. 4. Hypokalemia.  Replace   DVT prophylaxis:.  Heparin Code Status: Full code Family Communication: Discussed with patient Disposition Plan: Discharge home once electrolytes are corrected.   Consultants:     Procedures:     Antimicrobials:       Subjective: Patient wants to have more [place] ice, denies any significant pain or shortness of breath  Objective: Vitals:   12/18/19 2308 12/19/19 0110 12/19/19 0510 12/19/19 1947  BP: 125/66 94/72 100/71   Pulse: 96 94 83   Resp: 16 16 16    Temp: 98.7 F (37.1 C) 98.1 F (36.7 C) 97.7 F (36.5 C)   TempSrc: Oral Oral Oral   SpO2: 100% 100% 100% 97%  Height:        Intake/Output Summary (Last 24 hours) at 12/19/2019 2058 Last data filed at 12/19/2019 1300 Gross per 24 hour  Intake 1727.51 ml  Output 200 ml  Net 1527.51 ml   There were no vitals  filed for this visit.  Examination:  General exam: Appears calm and comfortable  Respiratory system: Clear to auscultation. Respiratory effort normal. Cardiovascular system: S1 & S2 heard, RRR. No JVD, murmurs, rubs, gallops or clicks. No pedal edema. Gastrointestinal system: Abdomen is nondistended, soft and nontender. No organomegaly or masses felt. Normal bowel sounds heard.  GJ tube in place.  Spit fistula present on upper chest with bag associated Central nervous system: Alert and oriented. No focal neurological deficits. Extremities: Symmetric 5 x 5 power. Skin: No rashes, lesions or ulcers Psychiatry: Judgement and insight appear normal. Mood & affect appropriate.     Data Reviewed: I have personally reviewed following labs and imaging studies  CBC: Recent Labs  Lab 12/18/19 2015  WBC 10.6*  NEUTROABS 7.8*  HGB 16.0  HCT 58.0*  MCV 95.6  PLT Q000111Q*   Basic Metabolic Panel: Recent Labs  Lab 12/18/19 2015 12/19/19 0211 12/19/19 0607 12/19/19 1028 12/19/19 1413  NA 174* 172* 169* 167* 164*  K 3.9 3.4* 3.2* 3.6 3.2*  CL 122* 127* 125* 125* 123*  CO2 39* 36* 38* 35* 34*  GLUCOSE 112* 121* 116* 111* 101*  BUN 64* 57* 55* 51* 48*  CREATININE 1.17 1.12 1.19 1.05 1.06  CALCIUM 10.7* 9.6 9.7 9.8 9.7   GFR: CrCl cannot be calculated (Unknown ideal weight.). Liver Function Tests: Recent Labs  Lab 12/18/19 2015  AST 27  ALT 42  ALKPHOS 98  BILITOT 0.6  PROT 7.9  ALBUMIN 4.3   No results for input(s): LIPASE, AMYLASE in the last 168 hours. No results for input(s): AMMONIA in the last 168 hours. Coagulation Profile: No results for input(s): INR, PROTIME in the last 168 hours. Cardiac Enzymes: No results for input(s): CKTOTAL, CKMB, CKMBINDEX, TROPONINI in the last 168 hours. BNP (last 3 results) No results for input(s): PROBNP in the last 8760 hours. HbA1C: No results for input(s): HGBA1C in the last 72 hours. CBG: No results for input(s): GLUCAP in the last  168 hours. Lipid Profile: No results for input(s): CHOL, HDL, LDLCALC, TRIG, CHOLHDL, LDLDIRECT in the last 72 hours. Thyroid Function Tests: No results for input(s): TSH, T4TOTAL, FREET4, T3FREE, THYROIDAB in the last 72 hours. Anemia Panel: No results for input(s): VITAMINB12, FOLATE, FERRITIN, TIBC, IRON, RETICCTPCT in the last 72 hours. Sepsis Labs: No results for input(s): PROCALCITON, LATICACIDVEN in the last 168 hours.  Recent Results (from the past 240 hour(s))  SARS CORONAVIRUS 2 (TAT 6-24 HRS) Nasopharyngeal Nasopharyngeal Swab     Status: None   Collection Time: 12/18/19  9:19 PM   Specimen: Nasopharyngeal Swab  Result Value Ref Range Status   SARS Coronavirus 2 NEGATIVE NEGATIVE Final    Comment: (NOTE) SARS-CoV-2 target nucleic acids are NOT DETECTED. The SARS-CoV-2 RNA is generally detectable in upper and lower respiratory specimens during the acute phase of infection. Negative results do not preclude SARS-CoV-2 infection, do not rule out co-infections with other pathogens, and should not be used as the sole basis for treatment or other patient management decisions. Negative results must be combined with clinical observations, patient history, and epidemiological information. The expected result is Negative. Fact Sheet for Patients: SugarRoll.be Fact Sheet for Healthcare Providers: https://www.woods-mathews.com/ This test is not yet approved or cleared by the [place] FDA and  has been authorized for detection and/or diagnosis of SARS-CoV-2 by FDA under an Emergency Use Authorization (EUA). This EUA will remain  in effect (meaning this test can be used) for the duration of the COVID-19 declaration under Section 56 4(b)(1) of the Act, 21 U.S.C. section 360bbb-3(b)(1), unless the authorization is terminated or revoked sooner. Performed at [facility], [place] [address] [place], [place] [address]           Radiology Studies: No results found.      Scheduled Meds: . tamsulosin  0.4 mg Oral QHS  . traZODone  50 mg Oral QHS   Continuous Infusions: . dextrose 75 mL/hr at 12/19/19 1509     LOS: 0 days    Time spent: 54mins    Kathie Dike, MD [facility]   If 7PM-7AM, please contact night-coverage [email or web address]  12/19/2019, 8:58 PM

## 2019-12-19 NOTE — Progress Notes (Signed)
Patient meds taken and given to [facility] to be locked up in pharmacy. Meds counted and labeled.

## 2019-12-20 DIAGNOSIS — E43 Unspecified severe protein-calorie malnutrition: Secondary | ICD-10-CM

## 2019-12-20 DIAGNOSIS — E870 Hyperosmolality and hypernatremia: Secondary | ICD-10-CM

## 2019-12-20 DIAGNOSIS — Z931 Gastrostomy status: Secondary | ICD-10-CM

## 2019-12-20 DIAGNOSIS — E860 Dehydration: Secondary | ICD-10-CM

## 2019-12-20 LAB — BASIC METABOLIC PANEL
Anion gap: 11 (ref 5–15)
BUN: 39 mg/dL — ABNORMAL HIGH (ref 6–20)
CO2: 33 mmol/L — ABNORMAL HIGH (ref 22–32)
Calcium: 10.3 mg/dL (ref 8.9–10.3)
Chloride: 120 mmol/L — ABNORMAL HIGH (ref 98–111)
Creatinine, Ser: 1.08 mg/dL (ref 0.61–1.24)
GFR calc Af Amer: >60 mL/min (ref >60)
GFR calc non Af Amer: >60 mL/min (ref >60)
Glucose, Bld: 93 mg/dL (ref 70–99)
Potassium: 3.7 mmol/L (ref 3.5–5.1)
Sodium: 164 mmol/L (ref 135–145)

## 2019-12-20 LAB — MAGNESIUM: Magnesium: 2.6 mg/dL — ABNORMAL HIGH (ref 1.7–2.4)

## 2019-12-20 MED ORDER — JEVITY 1.2 CAL PO LIQD
1000.0000 mL | ORAL | Status: DC
  Administered 2019-12-20: 1000 mL

## 2019-12-20 MED ORDER — PRO-STAT SUGAR FREE PO LIQD
30.0000 mL | Freq: Every day | ORAL | Status: DC
  Administered 2019-12-22: 30 mL
  Filled 2019-12-20: qty 30

## 2019-12-20 MED ORDER — POTASSIUM CL IN DEXTROSE 5% 20 MEQ/L IV SOLN
20.0000 meq | INTRAVENOUS | Status: DC
  Administered 2019-12-20 – 2019-12-22 (×2): 20 meq via INTRAVENOUS
  Filled 2019-12-20 (×13): qty 1000

## 2019-12-20 MED ORDER — FREE WATER
100.0000 mL | Status: DC
  Administered 2019-12-20 – 2019-12-22 (×12): 100 mL

## 2019-12-20 NOTE — Progress Notes (Addendum)
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  He has undergone a retrosternal colonic interposition for esophageal atresia with partial esophageal resection and creation of a spit fistula.  He also has a GJ tube.  In the emergency room he was noted to be severely hypernatremic with a serum sodium of 174.  He was admitted for further hydration.   Assessment & Plan:   Active Problems:   Hypernatremia   Protein-calorie malnutrition, severe   1. Hypernatremia.  Secondary to dehydration.  Sodium 134 on admission.  Started on hypotonic fluids with D5W.  Serum sodium is trending down.  Would continue with current treatments. 2. History of esophageal atresia.  Complicated surgical history.  He has been followed at [place] tube.  Patient is tube dependent for nutrition.  Gastroenterology consulted. 4. Hypokalemia.  Replace   DVT prophylaxis:.  Heparin Code Status: Full code Family Communication: Discussed with patient Disposition Plan: Patient was discussed at [facility], currently they do not have any bed availability.  At this point, since his G-tube is functioning appropriately, it would likely be more reasonable to continue intravenous hydration here until electrolytes have corrected.  Once electrolytes have been corrected, he can likely be discharged and follow-up with his outpatient providers at [facility].    Consultants:     Procedures:     Antimicrobials:       Subjective: Complains of dryness in his eyes.  No shortness of breath.  Complains that he needs help someone cutting his toenails.  Objective: Vitals:   12/19/19 1947 12/19/19 2237  12/20/19 0448 12/20/19 1558  BP:  104/70 96/66 100/60  Pulse:  87 79 77  Resp:  20 16 16   Temp:  (!) 97.4 F (36.3 C) 98.1 F (36.7 C)   TempSrc:  Oral Oral   SpO2: 97% 100% 99% 99%  Height:        Intake/Output Summary (Last 24 hours) at 12/20/2019 2119 Last data filed at 12/20/2019 1536 Gross per 24 hour  Intake 2807.15 ml  Output -  Net 2807.15 ml   There were no vitals filed for this visit.  Examination:  General exam: Alert, awake, oriented x 3 Respiratory system: Clear to auscultation. Respiratory effort normal. Esophageal stoma with attached back to collect liquids. Cardiovascular system:RRR. No murmurs, rubs, gallops. Gastrointestinal system: Abdomen is nondistended, soft and nontender. No organomegaly or masses felt. Normal bowel sounds heard.  G-tube in place Central nervous system: Alert and oriented. No focal neurological deficits. Extremities: No C/C/E, +pedal pulses Skin: No rashes, lesions or ulcers Psychiatry: Judgement and insight appear normal. Mood & affect appropriate.    Data Reviewed: I have personally reviewed following labs and imaging studies  CBC: Recent Labs  Lab 12/18/19 2015  WBC 10.6*  NEUTROABS 7.8*  HGB 16.0  HCT 58.0*  MCV 95.6  PLT Q000111Q*   Basic Metabolic Panel: Recent Labs  Lab 12/19/19 0211 12/19/19 0607 12/19/19 1028 12/19/19 1413 12/20/19 0546  NA 172* 169* 167* 164* 164*  K 3.4* 3.2* 3.6 3.2* 3.7  CL 127* 125* 125* 123* 120*  CO2 36* 38* 35* 34* 33*  GLUCOSE 121* 116* 111* 101* 93  BUN 57* 55* 51* 48*  39*  CREATININE 1.12 1.19 1.05 1.06 1.08  CALCIUM 9.6 9.7 9.8 9.7 10.3  MG  --   --   --   --  2.6*   GFR: CrCl cannot be calculated (Unknown ideal weight.). Liver Function Tests: Recent Labs  Lab 12/18/19 2015  AST 27  ALT 42  ALKPHOS 98  BILITOT 0.6  PROT 7.9  ALBUMIN 4.3   No results for input(s): LIPASE, AMYLASE in the last 168 hours. No results for input(s): AMMONIA in the last 168 hours. Coagulation  Profile: No results for input(s): INR, PROTIME in the last 168 hours. Cardiac Enzymes: No results for input(s): CKTOTAL, CKMB, CKMBINDEX, TROPONINI in the last 168 hours. BNP (last 3 results) No results for input(s): PROBNP in the last 8760 hours. HbA1C: No results for input(s): HGBA1C in the last 72 hours. CBG: No results for input(s): GLUCAP in the last 168 hours. Lipid Profile: No results for input(s): CHOL, HDL, LDLCALC, TRIG, CHOLHDL, LDLDIRECT in the last 72 hours. Thyroid Function Tests: No results for input(s): TSH, T4TOTAL, FREET4, T3FREE, THYROIDAB in the last 72 hours. Anemia Panel: No results for input(s): VITAMINB12, FOLATE, FERRITIN, TIBC, IRON, RETICCTPCT in the last 72 hours. Sepsis Labs: No results for input(s): PROCALCITON, LATICACIDVEN in the last 168 hours.  Recent Results (from the past 240 hour(s))  SARS CORONAVIRUS 2 (TAT 6-24 HRS) Nasopharyngeal Nasopharyngeal Swab     Status: None   Collection Time: 12/18/19  9:19 PM   Specimen: Nasopharyngeal Swab  Result Value Ref Range Status   SARS Coronavirus 2 NEGATIVE NEGATIVE Final    Comment: (NOTE) SARS-CoV-2 target nucleic acids are NOT DETECTED. The SARS-CoV-2 RNA is generally detectable in upper and lower respiratory specimens during the acute phase of infection. Negative results do not preclude SARS-CoV-2 infection, do not rule out co-infections with other pathogens, and should not be used as the sole basis for treatment or other patient management decisions. Negative results must be combined with clinical observations, patient history, and epidemiological information. The expected result is Negative. Fact Sheet for Patients: SugarRoll.be Fact Sheet for Healthcare Providers: https://www.woods-mathews.com/ This test is not yet approved or cleared by the [place] FDA and  has been authorized for detection and/or diagnosis of SARS-CoV-2 by FDA under an Emergency  Use Authorization (EUA). This EUA will remain  in effect (meaning this test can be used) for the duration of the COVID-19 declaration under Section 56 4(b)(1) of the Act, 21 U.S.C. section 360bbb-3(b)(1), unless the authorization is terminated or revoked sooner. Performed at [facility], [place] [address], [place], [place] [address]          Radiology Studies: No results found.      Scheduled Meds: . feeding supplement (PRO-STAT SUGAR FREE 64)  30 mL Per Tube Daily  . free water  100 mL Per Tube Q4H  . heparin injection (subcutaneous)  5,000 Units Subcutaneous Q8H  . tamsulosin  0.4 mg Oral QHS  . traZODone  50 mg Oral QHS   Continuous Infusions: . dextrose 5 % with KCl 20 mEq / L    . feeding supplement (JEVITY 1.2 CAL) 1,000 mL (12/20/19 1448)     LOS: 1 day    Time spent: 40mins    Kathie Dike, MD [facility]   If 7PM-7AM, please contact night-coverage [email or web address]  12/20/2019, 9:19 PM
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
## 2019-12-20 NOTE — Progress Notes (Signed)
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    GOAL:   Weight gain  MONITOR:   Labs, Weight trends, I & O's, TF tolerance  REASON FOR ASSESSMENT:   Consult Enteral/tube feeding initiation and management  ASSESSMENT:  49 year old male with past medical history of GERD, tube feeding dependent via [place] tube, esophageal atresia with complicated surgical history who receives the majority of his care at [facility] under the cardiothoracic surgery service. Patient has undergone retrosternal colonic interposition for esophageal atresia with partial esophageal resection and creation of spit fistula admitted with severe hypernatremia with serum Na of 174.  Met with patient at bedside this morning, assorted juices and cups of ice on bedside tray. Patient, who prefers to be called Thomas [name], reports consuming beverages for taste, all oral fluids drained into spit fistula that patient empties as needed. Patient is tube feeding dependent for nutrition via [place]ie tube. Patient reports recently switching to 5 cans Twocal daily and tolerating well. Previously, patient on Osmolite 1.5 and stating that it gave him diarrhea.   Home Tube Feed regimen provides 2375 kcal, 99.5 grams of protein, and 830 ml free water.   Current wt: 48.2 kg (106 lbs)  per pt report of bed wt taken this morning by RN.  Patient endorses wt loss over the past 6-7 months, recalling 140-145 lbs prior to July hospitalization at [facility] supported by weight history. On 6/28 pt wt 63.5 kg (139.7 lbs), 7/05 pt wt 61.2 kg (134.64 lbs), 9/26 pt wt 52.6 kg (115.72 lbs), on 12/9 pt wt 47.6 kg (104.72 lbs) Noted severe 33.7 lb (24%) wt loss in the past 6.5 months.   Patient meets criteria of malnutrition in the context of chronic disease given wt losses as well as severe fat and muscle depletions to entire body noted below. Will start patient on continuous tube feedings of Jevity 1.2 in substitution for Twocal and monitor for toleration.    Per Care Everywhere: 12/12 [facility] - J tube exchange Tube feed recommendations: 6 cans Osmolite 1.5 daily Prostat 30 ml daily 50 ml H20 flush ac, pc, and before/after prostat 340 ml H20 daily  Medications reviewed  Labs: Na 164 (H), BUN 39 (H), Mg 2.6 (H)  NUTRITION - FOCUSED PHYSICAL EXAM: 1/13 Findings: Moderate fat depletions to thoracic/lumbar region; Severe fat depletions to orbital, upper arm, and buccal regions; Moderate muscle depletions to temple and dorsal hand; Severe muscle depletions to clavicle bone, clavicle and acromion bone, scapular bone, patellar, and posterior calf regions   Diet Order:   Diet Order    None      EDUCATION NEEDS:   Education needs have been addressed  Skin:  Skin Assessment: Reviewed RN Assessment  Last BM:  1/10  Height:   Ht Readings from Last 1 Encounters:  12/18/19 5' 6"  (1.676 m)    Weight:  Wt Readings from Last 1 Encounters:  11/18/19 47.6 kg    Ideal Body Weight:  64.6 kg  BMI:  Body mass index is 16.94 kg/m.  Estimated Nutritional Needs:   Kcal:  1950-2210 (MSJ x 1.5-1.7)  Protein:  90-110  Fluid:  > 1.9 L/day   Lajuan Lines, RD, LDN Clinical Nutrition Office Telephone [phone] After Hours/Weekend Pager: [phone]

## 2019-12-21 LAB — BASIC METABOLIC PANEL
Anion gap: 6 (ref 5–15)
BUN: 33 mg/dL — ABNORMAL HIGH (ref 6–20)
CO2: 31 mmol/L (ref 22–32)
Calcium: 9.1 mg/dL (ref 8.9–10.3)
Chloride: 114 mmol/L — ABNORMAL HIGH (ref 98–111)
Creatinine, Ser: 0.95 mg/dL (ref 0.61–1.24)
GFR calc Af Amer: >60 mL/min (ref >60)
GFR calc non Af Amer: >60 mL/min (ref >60)
Glucose, Bld: 189 mg/dL — ABNORMAL HIGH (ref 70–99)
Potassium: 3.6 mmol/L (ref 3.5–5.1)
Sodium: 151 mmol/L — ABNORMAL HIGH (ref 135–145)

## 2019-12-21 LAB — CORTISOL-AM, BLOOD: Cortisol - AM: 8.9 ug/dL (ref 6.7–22.6)

## 2019-12-21 MED ORDER — LACTATED RINGERS IV BOLUS
1000.0000 mL | Freq: Once | INTRAVENOUS | Status: AC
  Administered 2019-12-21: 1000 mL via INTRAVENOUS

## 2019-12-21 MED ORDER — LIDOCAINE 5 % EX PTCH
1.0000 | MEDICATED_PATCH | Freq: Every day | CUTANEOUS | Status: DC | PRN
  Administered 2019-12-21 (×2): 1 via TRANSDERMAL
  Filled 2019-12-21 (×2): qty 1

## 2019-12-21 NOTE — Progress Notes (Signed)
  Subjective:  Patient states his ostomy bag has been leaking.  Nursing staff is trying to change the back. He states he had a bowel movement earlier today.  He is not sure it was soft or formed. He reports no issues with gastrostomy tube.  He says tube has been working.  Objective: Blood pressure 90/63, pulse 75, temperature 97.8 F (36.6 C), temperature source Oral, resp. rate 18, height '5\' 6"'$  (1.676 m), SpO2 98 %. Patient is alert and in no acute distress. He has generalized wasting. Esophagostomy bag containing food debris and liquid.  Bag is leaking. Dressing at the site is moist.  There is no reflux of gastric contents around the G-tube site.  He has small amount of granulation tissue.  Labs/studies Results:  CBC Latest Ref Rng & Units 12/18/2019 11/18/2019 06/11/2019  WBC 4.0 - 10.5 K/uL 10.6(H) 7.4 35.6(H)  Hemoglobin 13.0 - 17.0 g/dL 16.0 14.5 14.8  Hematocrit 39.0 - 52.0 % 58.0(H) 49.9 45.9  Platelets 150 - 400 K/uL 145(L) 221 284    CMP Latest Ref Rng & Units 12/21/2019 12/20/2019 12/19/2019  Glucose 70 - 99 mg/dL 189(H) 93 101(H)  BUN 6 - 20 mg/dL 33(H) 39(H) 48(H)  Creatinine 0.61 - 1.24 mg/dL 0.95 1.08 1.06  Sodium 135 - 145 mmol/L 151(H) 164(HH) 164(HH)  Potassium 3.5 - 5.1 mmol/L 3.6 3.7 3.2(L)  Chloride 98 - 111 mmol/L 114(H) 120(H) 123(H)  CO2 22 - 32 mmol/L 31 33(H) 34(H)  Calcium 8.9 - 10.3 mg/dL 9.1 10.3 9.7  Total Protein 6.5 - 8.1 g/dL - - -  Total Bilirubin 0.3 - 1.2 mg/dL - - -  Alkaline Phos 38 - 126 U/L - - -  AST 15 - 41 U/L - - -  ALT 0 - 44 U/L - - -    Hepatic Function Latest Ref Rng & Units 12/18/2019 11/18/2019 06/05/2019  Total Protein 6.5 - 8.1 g/dL 7.9 8.1 7.3  Albumin 3.5 - 5.0 g/dL 4.3 4.5 4.2  AST 15 - 41 U/L '27 25 17  '$ ALT 0 - 44 U/L 42 33 19  Alk Phosphatase 38 - 126 U/L 98 97 76  Total Bilirubin 0.3 - 1.2 mg/dL 0.6 0.6 1.1  Bilirubin, Direct 0.0 - 0.2 mg/dL - - -     Fasting cortisol level 6.5 which is normal.  Assessment:  #1.   Gastrostomy tube appears to be functioning.  There is no reflux of gastric contents around the gastrostomy tube site.  Patient is on Jevity 1.2.  Goal is 65 mL/h. #2.  Hypernatremia.  Serum sodium has improved significantly from 174 on admission to 151. today.  Fasting cortisol level is normal.  Hyponatremia felt to be due to dehydration.  #3.  Malnutrition.  Patient remains with low BMI.  If he is able to maintain calorie intake via gastrostomy tube which should improve as he does not have any obvious infection to reduce catabolic state.  Per dietary recommendations he will be receiving 1972 kcal per day with a rate of 65 mL/h.  #4.  Patient has esophagostomy/spit fistula which was created back in July 2020 when he presented with perforation at esophagocolonic anastomosis(history of congenital TOF with colonic interposition) large hydropneumothorax with sepsis.

## 2019-12-21 NOTE — TOC Progression Note (Signed)
Transition of Care [facility]) - Progression Note    Patient Details  Name: Thomas [name] MRN: [id number] Date of Birth: 02-22-1971  [facility]) CM/SW Contact  Boneta Lucks, RN Phone Number: 12/21/2019, 12:38 PM  Clinical Narrative:   Patient lives with his girlfriend, discharge plan was to go home today. Patient's car needs work per girl friend and he is not talking to his mother, patient will need a ride home.  Patient has medicaid.  He gave permission for CM to set him up with [facility], he has never used them before.  Information printed and provided,  as patient will need follow up appointments very soon in [place] with his [employer].    [facility] cancelled for today, as patient became dizzy and hypotensive. TOC to follow.   Expected Discharge Plan: Home/Self Care    Expected Discharge Plan and Services Expected Discharge Plan: Home/Self Care

## 2019-12-21 NOTE — Evaluation (Signed)
Physical Therapy Evaluation Patient Details Name: Thomas [name] MRN: [id number] DOB: 19-May-1971 Today's Date: 12/21/2019   History of Present Illness  Thomas [name] is a 49 y.o. 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  He also has a J- tube for feeding in his lower abdomen.Reports increased output from his G-tube over the 3 weeks since he was hospitalized at [facility] for G-J tube malfunction.  On that admission his J-tube was replaced at bedside, and his tube feeds were resumed.  He was also hypernatremic at 145, this improved on discharge.    Clinical Impression  Patient functioning near baseline for functional mobility and gait, limited mostly due to c/o dizziness when sitting up at bedside and standing with orthostatic BP's as follows: supine 88/50, sitting 88/55, standing 81/49.  Patient requested hand held assist when using SPC mostly due to fear of falling, but able to ambulate safely in room and in hallway with SpO2 maintaining at 98% while on room air, limited due to c/o fatigue and requested to go back to bed after therapy - RN notified.  Patient will benefit from continued physical therapy in hospital and recommended venue below to increase strength, balance, endurance for safe ADLs and gait.     Follow Up Recommendations Home health PT;Supervision - Intermittent    Equipment Recommendations  Cane(single point cane)    Recommendations for Other Services       Precautions / Restrictions Precautions Precautions: Fall Restrictions Weight Bearing Restrictions: No      Mobility  Bed Mobility Overal bed mobility: Modified Independent             General bed mobility comments: increased time  Transfers Overall  transfer level: Needs assistance Equipment used: Straight cane Transfers: Sit to/from Stand;Stand Pivot Transfers Sit to Stand: Supervision;Min guard Stand pivot transfers: Supervision;Min guard       General transfer comment: slightly labored movement, tends to lean on nearby objects for support, requested hand held assist  Ambulation/Gait Ambulation/Gait assistance: Min guard;Supervision Gait Distance (Feet): 45 Feet Assistive device: Straight cane Gait Pattern/deviations: Decreased step length - right;Decreased step length - left;Decreased stride length Gait velocity: decreased   General Gait Details: slightly labored cadence using SPC, occasional hand held assist per patient's request due to fear of falling and c/o mild dizziness  Stairs            Wheelchair Mobility    Modified Rankin (Stroke Patients Only)       Balance Overall balance assessment: Needs assistance Sitting-balance support: Feet supported;No upper extremity supported Sitting balance-Leahy Scale: Good Sitting balance - Comments: seated at EOB   Standing balance support: During functional activity;Single extremity supported Standing balance-Leahy Scale: Fair Standing balance comment: using SPC                             Pertinent Vitals/Pain Pain Assessment: 0-10 Pain Score: 9  Pain Location: stomach and low back Pain Descriptors / Indicators: Sore;Aching Pain Intervention(s): Limited activity within patient's tolerance;Monitored during session    Home Living Family/patient expects to be discharged to:: Private residence Living Arrangements: Spouse/significant other Available Help at Discharge: Family;Available PRN/intermittently Type of Home: House Home Access: Ramped entrance     Home Layout: Two level Home Equipment:  Walker - 2 wheels;Shower seat Additional Comments: lost his cane in hospital, states his walker to big to use in his home, states his wife is blind and  limited for helping him    Prior Function Level of Independence: Independent with assistive device(s)         Comments: household and short distanced community ambulator with [facility] most of time     Hand Dominance        Extremity/Trunk Assessment   Upper Extremity Assessment Upper Extremity Assessment: Overall WFL for tasks assessed    Lower Extremity Assessment Lower Extremity Assessment: Generalized weakness    Cervical / Trunk Assessment Cervical / Trunk Assessment: Kyphotic  Communication   Communication: No difficulties  Cognition Arousal/Alertness: Awake/alert Behavior During Therapy: WFL for tasks assessed/performed Overall Cognitive Status: Within Functional Limits for tasks assessed                                        General Comments      Exercises     Assessment/Plan    PT Assessment Patient needs continued PT services  PT Problem List Decreased strength;Decreased activity tolerance;Decreased balance;Decreased mobility       PT Treatment Interventions Balance training;Gait training;Functional mobility training;Therapeutic activities;Therapeutic exercise;Patient/family education    PT Goals (Current goals can be found in the Care Plan section)  Acute Rehab PT Goals Patient Stated Goal: walk without losing balance PT Goal Formulation: With patient Time For Goal Achievement: 12/25/19 Potential to Achieve Goals: Good    Frequency Min 3X/week   Barriers to discharge        Co-evaluation               AM-PAC PT "6 Clicks" Mobility  Outcome Measure Help needed turning from your back to your side while in a flat bed without using bedrails?: None Help needed moving from lying on your back to sitting on the side of a flat bed without using bedrails?: None Help needed moving to and from a bed to a chair (including a wheelchair)?: A Little Help needed standing up from a chair using your arms (e.g., wheelchair or bedside  chair)?: A Little Help needed to walk in hospital room?: A Little Help needed climbing 3-5 steps with a railing? : A Lot 6 Click Score: 19    End of Session   Activity Tolerance: Patient tolerated treatment well;Patient limited by fatigue Patient left: in bed;with call bell/phone within reach Nurse Communication: Mobility status PT Visit Diagnosis: Other abnormalities of gait and mobility (R26.89);Unsteadiness on feet (R26.81);Muscle weakness (generalized) (M62.81)    Time: [id number] PT Time Calculation (min) (ACUTE ONLY): 33 min   Charges:   PT Evaluation $PT Eval Moderate Complexity: 1 Mod PT Treatments $Therapeutic Activity: 23-37 mins        11:39 AM, 12/21/19 Lonell Grandchild, MPT Physical Therapist with [facility] [phone] office [phone] mobile phone

## 2019-12-21 NOTE — Plan of Care (Signed)
  Problem: Acute Rehab PT Goals(only PT should resolve) Goal: Patient Will Transfer Sit To/From Stand Outcome: Progressing Flowsheets (Taken 12/21/2019 1141) Patient will transfer sit to/from stand: with supervision Goal: Pt Will Transfer Bed To Chair/Chair To Bed Outcome: Progressing Flowsheets (Taken 12/21/2019 1141) Pt will Transfer Bed to Chair/Chair to Bed: with supervision Goal: Pt Will Ambulate Outcome: Progressing Flowsheets (Taken 12/21/2019 1141) Pt will Ambulate:  75 feet  with supervision  with cane   11:42 AM, 12/21/19 Lonell Grandchild, MPT Physical Therapist with [facility] 336 [phone] office [phone] mobile phone

## 2019-12-21 NOTE — TOC Progression Note (Addendum)
Transition of Care [facility]) - Progression Note    Patient Details  Name: Thomas [name] MRN: [id number] Date of Birth: 10-08-1971  Transition of Care [facility]) CM/SW Contact  Boneta Lucks, RN Phone Number: 12/21/2019, 3:12 PM  Clinical Narrative:   Patient is scheduled for follow up with Dr Clarisa Kindred at [address], [place],  [address] [phone].  CM scheduled [facility] transportation for this visit.  Updated Patient and RN. [facility] will call the day before to set a time for pickup.   PT is recommending HHPT, and patient needs a cane.  Juliann Pulse with [facility] will delivery cane to the room. [place] with [facility] to see if they can take patient for [facility] RN/PT.  She will check with branch and advise.   Expected Discharge Plan: [place] Barriers to Discharge: Continued Medical Work up  Expected Discharge Plan and Services Expected Discharge Plan: [place]   Discharge Planning Services: Other - See comment(transportation)                     DME Arranged: Kasandra Knudsen DME Agency: [employer] Date DME Agency Contacted: 12/21/19 Time DME Agency ContactedUQ:[phone] Representative spoke with at DME Agency: Blake Divine     Social Determinants of Health ([place]) Interventions    Readmission Risk Interventions No flowsheet data found.

## 2019-12-21 NOTE — Progress Notes (Signed)
PROGRESS NOTE    Thomas [name]  [id number] DOB: 1971-10-08 DOA: 12/18/2019 PCP: Sinda Du, MD   Brief Narrative:  49 year old male with a history of esophageal atresia with a complicated surgical history who receives majority of his care at the first [facility] under the cardiothoracic surgery service.  He has undergone a retrosternal colonic interposition for esophageal atresia with partial esophageal resection and creation of a spit fistula.  He also has a GJ tube.  In the emergency room he was noted to be severely hypernatremic with a serum sodium of 174.  He was admitted for further hydration.  1/14: Patient overall doing better with improvement in hypernatremia today.  He continues to have significant dizziness with standing up and any form of ambulation.  He has been assessed by PT with no need for rehabilitation.  He has no home oxygen needs.  We will order fluid bolus and recheck orthostatics in a.m.  Assessment & Plan:   Active Problems:   Hypernatremia   Protein-calorie malnutrition, severe   1. Hypernatremia-improving.  Secondary to dehydration.  Sodium 134 on admission.  Started on hypotonic fluids with D5W.  Serum sodium is trending down.  Would continue with current treatments. 2. Orthostasis.  1 L fluid bolus and recheck orthostatics in a.m.  PT evaluation with no significant needs noted. 3. History of esophageal atresia.  Complicated surgical history.  He has been followed at [facility].  Patient is tube dependent for nutrition.  Gastroenterology consulted. 5. Hypokalemia-repleted.  Recheck in a.m.   DVT prophylaxis: Heparin Code Status: Full code Family Communication: None at bedside Disposition Plan: Likely discharge in a.m. after further sodium correction and improvement in orthostatics.  Will have patient follow-up with [facility] in the near future.  He will need podiatry follow-up in the outpatient setting.   He will need home health PT.   Consultants:   None  Procedures:   None  Antimicrobials:   None   Subjective: Patient seen and evaluated today with no new acute complaints or concerns. No acute concerns or events noted overnight.  He continues to have some dizziness with ambulation.  Objective: Vitals:   12/20/19 2151 12/21/19 0501 12/21/19 1403 12/21/19 1403  BP: (!) 90/57 (!) 91/58 90/63 90/63   Pulse: 75 80 76 75  Resp: 20 16 18 18   Temp: (!) 97.5 F (36.4 C) 97.8 F (36.6 C)    TempSrc: Oral Oral    SpO2: 100% 100% 98% 100%  Height:        Intake/Output Summary (Last 24 hours) at 12/21/2019 1423 Last data filed at 12/20/2019 2356 Gross per 24 hour  Intake 1187.56 ml  Output --  Net 1187.56 ml   There were no vitals filed for this visit.  Examination:  General exam: Appears calm and comfortable  Respiratory system: Clear to auscultation. Respiratory effort normal.  Currently on nasal cannula oxygen. Cardiovascular system: S1 & S2 heard, RRR. No JVD, murmurs, rubs, gallops or clicks. No pedal edema. Gastrointestinal system: Abdomen is nondistended, soft and nontender. No organomegaly or masses felt. Normal bowel sounds heard. Central nervous system: Alert and oriented. No focal neurological deficits. Extremities: Symmetric 5 x 5 power. Skin: No rashes, lesions or ulcers Psychiatry: Judgement and insight appear normal. Mood & affect appropriate.     Data Reviewed: I have personally reviewed following labs and imaging studies  CBC: Recent Labs  Lab 12/18/19 2015  WBC 10.6*  NEUTROABS 7.8*  HGB  16.0  HCT 58.0*  MCV 95.6  PLT [id number]*   Basic Metabolic Panel: Recent Labs  Lab 12/19/19 0607 12/19/19 1028 12/19/19 1413 12/20/19 0546 12/21/19 0631  NA 169* 167* 164* 164* 151*  K 3.2* 3.6 3.2* 3.7 3.6  CL 125* 125* 123* 120* 114*  CO2 38* 35* 34* 33* 31  GLUCOSE 116* 111* 101* 93 189*  BUN 55* 51* 48* 39* 33*  CREATININE 1.19 1.05 1.06 1.08 0.95   CALCIUM 9.7 9.8 9.7 10.3 9.1  MG  --   --   --  2.6*  --    GFR: CrCl cannot be calculated (Unknown ideal weight.). Liver Function Tests: Recent Labs  Lab 12/18/19 2015  AST 27  ALT 42  ALKPHOS 98  BILITOT 0.6  PROT 7.9  ALBUMIN 4.3   No results for input(s): LIPASE, AMYLASE in the last 168 hours. No results for input(s): AMMONIA in the last 168 hours. Coagulation Profile: No results for input(s): INR, PROTIME in the last 168 hours. Cardiac Enzymes: No results for input(s): CKTOTAL, CKMB, CKMBINDEX, TROPONINI in the last 168 hours. BNP (last 3 results) No results for input(s): PROBNP in the last 8760 hours. HbA1C: No results for input(s): HGBA1C in the last 72 hours. CBG: No results for input(s): GLUCAP in the last 168 hours. Lipid Profile: No results for input(s): CHOL, HDL, LDLCALC, TRIG, CHOLHDL, LDLDIRECT in the last 72 hours. Thyroid Function Tests: No results for input(s): TSH, T4TOTAL, FREET4, T3FREE, THYROIDAB in the last 72 hours. Anemia Panel: No results for input(s): VITAMINB12, FOLATE, FERRITIN, TIBC, IRON, RETICCTPCT in the last 72 hours. Sepsis Labs: No results for input(s): PROCALCITON, LATICACIDVEN in the last 168 hours.  Recent Results (from the past 240 hour(s))  SARS CORONAVIRUS 2 (TAT 6-24 HRS) Nasopharyngeal Nasopharyngeal Swab     Status: None   Collection Time: 12/18/19  9:19 PM   Specimen: Nasopharyngeal Swab  Result Value Ref Range Status   SARS Coronavirus 2 NEGATIVE NEGATIVE Final    Comment: (NOTE) SARS-CoV-2 target nucleic acids are NOT DETECTED. The SARS-CoV-2 RNA is generally detectable in upper and lower respiratory specimens during the acute phase of infection. Negative results do not preclude SARS-CoV-2 infection, do not rule out co-infections with other pathogens, and should not be used as the sole basis for treatment or other patient management decisions. Negative results must be combined with clinical observations, patient  history, and epidemiological information. The expected result is Negative. Fact Sheet for Patients: SugarRoll.be Fact Sheet for Healthcare Providers: https://www.woods-mathews.com/ This test is not yet approved or cleared by the [place] FDA and  has been authorized for detection and/or diagnosis of SARS-CoV-2 by FDA under an Emergency Use Authorization (EUA). This EUA will remain  in effect (meaning this test can be used) for the duration of the COVID-19 declaration under Section 56 4(b)(1) of the Act, 21 U.S.C. section 360bbb-3(b)(1), unless the authorization is terminated or revoked sooner. Performed at [facility], [place] [address], [place], [place] [address]          Radiology Studies: No results found.      Scheduled Meds: . feeding supplement (PRO-STAT SUGAR FREE 64)  30 mL Per Tube Daily  . free water  100 mL Per Tube Q4H  . heparin injection (subcutaneous)  5,000 Units Subcutaneous Q8H  . tamsulosin  0.4 mg Oral QHS  . traZODone  50 mg Oral QHS   Continuous Infusions: . dextrose 5 % with KCl 20 mEq / L 20  mEq (12/20/19 2200)  . feeding supplement (JEVITY 1.2 CAL) 1,000 mL (12/20/19 1448)     LOS: 2 days    Time spent: 30 minutes    Kari Montero Darleen Crocker, DO [facility] Pager [phone]  If 7PM-7AM, please contact night-coverage [email or web address] Password [facility] 12/21/2019, 2:23 PM

## 2019-12-22 LAB — BASIC METABOLIC PANEL
Anion gap: 5 (ref 5–15)
BUN: 19 mg/dL (ref 6–20)
CO2: 31 mmol/L (ref 22–32)
Calcium: 9.1 mg/dL (ref 8.9–10.3)
Chloride: 114 mmol/L — ABNORMAL HIGH (ref 98–111)
Creatinine, Ser: 0.83 mg/dL (ref 0.61–1.24)
GFR calc Af Amer: >60 mL/min (ref >60)
GFR calc non Af Amer: >60 mL/min (ref >60)
Glucose, Bld: 105 mg/dL — ABNORMAL HIGH (ref 70–99)
Potassium: 4 mmol/L (ref 3.5–5.1)
Sodium: 150 mmol/L — ABNORMAL HIGH (ref 135–145)

## 2019-12-22 MED ORDER — OXYCODONE HCL 5 MG PO TABS: 5.0000 mg | ORAL_TABLET | Freq: Three times a day (TID) | ORAL | 0 refills | Status: AC | PRN

## 2019-12-22 MED ORDER — TRAZODONE HCL 50 MG PO TABS: 50.0000 mg | ORAL_TABLET | Freq: Every day | ORAL | 0 refills | Status: DC

## 2019-12-22 MED ORDER — SODIUM CHLORIDE 0.45 % IV BOLUS
250.0000 mL | Freq: Once | INTRAVENOUS | Status: AC
  Administered 2019-12-22: 250 mL via INTRAVENOUS

## 2019-12-22 MED ORDER — TERAZOSIN HCL 2 MG PO CAPS: 2.0000 mg | ORAL_CAPSULE | Freq: Every day | ORAL | 0 refills | Status: DC

## 2019-12-22 MED ORDER — TAMSULOSIN HCL 0.4 MG PO CAPS: 0.4000 mg | ORAL_CAPSULE | Freq: Every day | ORAL | 0 refills | Status: DC

## 2019-12-22 NOTE — TOC Transition Note (Signed)
Transition of Care [facility]) - CM/SW Discharge Note   Patient Details  Name: ZYHIR NIEMELA MRN: [id number] Date of Birth: 10/19/1971  Transition of Care [facility]) CM/SW Contact:  Boneta Lucks, RN Phone Number: 12/22/2019, 2:52 PM   Clinical Narrative:   PT recommended HHPT.  CM called [facility], [facility], [facility], [facility] and [facility]. Due to staffing or not able to take Medicaid they could not take the referral.  [place] with [facility], paired with another she was able to get them to accept the referral for PT.    Final next level of care: [place] Barriers to Discharge: Barriers Resolved   Patient Goals and CMS Choice Patient states their goals for this hospitalization and ongoing recovery are:: to go home. CMS Medicare.gov Compare Post Acute Care list provided to:: Other (Comment Required)(Only available)    Discharge Placement                Patient to be transferred to facility by: [facility]   Patient and family notified of of transfer: 12/22/19  Discharge Plan and Services   Discharge Planning Services: Other - See comment(transportation)            DME Arranged: Kasandra Knudsen DME Agency: [employer] Date DME Agency Contacted: 12/21/19 Time DME Agency ContactedEV:[phone] Representative spoke with at DME Agency: [place]: PT [place]: [place] ([place]) Date [place]: 12/22/19 Time [place]: 1452 Representative spoke with at [place]: Romualdo Bolk

## 2019-12-22 NOTE — Discharge Instructions (Signed)
1. Follow up with PCP in 1-2 weeks with follow-up scheduled with [facility]isa [facility] on 12/28/2019 2. Repeat BMP in 1 week 3. Follow-up with podiatry as recommended in the next 1 week regarding toenail fungal infection and need for clipping 4. Continue on prior home medications

## 2019-12-22 NOTE — Discharge Summary (Signed)
Physician Discharge Summary  Thomas [name] [id number] DOB: 04/22/1971 DOA: 12/18/2019  PCP: Sinda Du, MD  Admit date: 12/18/2019  Discharge date: 12/22/2019  Admitted From:Home  Disposition:  Home  Recommendations for Outpatient Follow-up:  1. Follow up with PCP in 1-2 weeks with follow-up scheduled with [facility] on 12/28/2019 2. Repeat BMP in 1 week 3. Follow-up with podiatry as recommended in the next 1 week regarding toenail fungal infection and need for clipping 4. Continue on prior home medications  Home Health: Yes with RN and PT  Equipment/Devices: None  Discharge Condition: Stable  CODE STATUS: Full  Diet recommendation: Heart Healthy  Brief/Interim Summary: 49 year old male with a history of esophageal atresia with a complicated surgical history who receives majority of his care at the first [facility] under the cardiothoracic surgery service. He has undergone a retrosternal colonic interposition for esophageal atresia with partial esophageal resection and creation of a spit fistula. He also has a GJ tube. In the emergency room he was noted to be severely hypernatremic with a serum sodium of 174. He was admitted for further hydration.  1/14: Patient overall doing better with improvement in hypernatremia today.  He continues to have significant dizziness with standing up and any form of ambulation.  He has been assessed by PT with no need for rehabilitation.  He has no home oxygen needs.  We will order fluid bolus and recheck orthostatics in a.m.  1/15: Patient continues to have some softer blood pressure readings, but is no longer symptomatic.  He does have his usual ongoing pain which is at baseline.  He is not noted to be orthostatic.  He will be set up with home physical therapy as well as nursing care.  His serum sodium levels have stabilized at approximately 150 and he will follow-up at [facility] in the near future regarding further care  for his GJ tube.  Discharge Diagnoses:  Active Problems:   Hypernatremia   Protein-calorie malnutrition, severe  Principal discharge diagnosis: Hypernatremia secondary to dehydration.  Discharge Instructions  Discharge Instructions    Diet - low sodium heart healthy   Complete by: As directed    Increase activity slowly   Complete by: As directed      Allergies as of 12/22/2019      Reactions   Aspirin Other (See Comments)   Burns stomach   Penicillins    Did it involve swelling of the face/tongue/throat, SOB, or low BP? Unknown Did it involve sudden or severe rash/hives, skin peeling, or any reaction on the inside of your mouth or nose? Unknown Did you need to seek medical attention at a hospital or doctor's office? Unknown When did it last happen?patient refuses to take due to parents being allergic If all above answers are "NO", may proceed with cephalosporin use. Parents allergic       Medication List    TAKE these medications   oxyCODONE 5 MG immediate release tablet Commonly known as: Oxy IR/ROXICODONE Take 5 mg by mouth 3 (three) times daily as needed for moderate pain.   tamsulosin 0.4 MG Caps capsule Commonly known as: FLOMAX Take 0.4 mg by mouth at bedtime.   terazosin 2 MG capsule Commonly known as: HYTRIN Take 2 mg by mouth at bedtime.   traZODone 50 MG tablet Commonly known as: DESYREL Take 50 mg by mouth at bedtime.      Follow-up Information    Dr Clarisa Kindred Follow up.   Why:  Jan 21  at 11:30 [facility] transportation is set up. They will call the day before to set a pick up time. Contact information: [address] [place], [place] [address] [phone].         [facility] Follow up in 1 week(s).   Contact information: [address] [place] [address] [phone]          Allergies  Allergen Reactions  . Aspirin Other (See Comments)    Burns stomach   . Penicillins     Did it involve swelling  of the face/tongue/throat, SOB, or low BP? Unknown Did it involve sudden or severe rash/hives, skin peeling, or any reaction on the inside of your mouth or nose? Unknown Did you need to seek medical attention at a hospital or doctor's office? Unknown When did it last happen?patient refuses to take due to parents being allergic If all above answers are "NO", may proceed with cephalosporin use. Parents allergic     Consultations:  GI   Procedures/Studies:  No results found.   Discharge Exam: Vitals:   12/22/19 0453 12/22/19 0627  BP: (!) 81/42 (!) 98/56  Pulse: 93   Resp:    Temp:    SpO2: 100%    Vitals:   12/22/19 0443 12/22/19 0450 12/22/19 0453 12/22/19 0627  BP: (!) 88/60 (!) 91/51 (!) 81/42 (!) 98/56  Pulse: 70 80 93   Resp: 18 18    Temp: 97.9 F (36.6 C)     TempSrc: Oral     SpO2: 100% 100% 100%   Height:        General: Pt is alert, awake, not in acute distress Cardiovascular: RRR, S1/S2 +, no rubs, no gallops Respiratory: CTA bilaterally, no wheezing, no rhonchi Abdominal: Soft, NT, ND, bowel sounds + Extremities: no edema, no cyanosis    The results of significant diagnostics from this hospitalization (including imaging, microbiology, ancillary and laboratory) are listed below for reference.     Microbiology: Recent Results (from the past 240 hour(s))  SARS CORONAVIRUS 2 (TAT 6-24 HRS) Nasopharyngeal Nasopharyngeal Swab     Status: None   Collection Time: 12/18/19  9:19 PM   Specimen: Nasopharyngeal Swab  Result Value Ref Range Status   SARS Coronavirus 2 NEGATIVE NEGATIVE Final    Comment: (NOTE) SARS-CoV-2 target nucleic acids are NOT DETECTED. The SARS-CoV-2 RNA is generally detectable in upper and lower respiratory specimens during the acute phase of infection. Negative results do not preclude SARS-CoV-2 infection, do not rule out co-infections with other pathogens, and should not be used as the sole basis for treatment or other patient  management decisions. Negative results must be combined with clinical observations, patient history, and epidemiological information. The expected result is Negative. Fact Sheet for Patients: SugarRoll.be Fact Sheet for Healthcare Providers: https://www.woods-mathews.com/ This test is not yet approved or cleared by the [place] FDA and  has been authorized for detection and/or diagnosis of SARS-CoV-2 by FDA under an Emergency Use Authorization (EUA). This EUA will remain  in effect (meaning this test can be used) for the duration of the COVID-19 declaration under Section 56 4(b)(1) of the Act, 21 U.S.C. section 360bbb-3(b)(1), unless the authorization is terminated or revoked sooner. Performed at [facility], [place] [address], [place], [place] [address]      Labs: BNP (last 3 results) No results for input(s): BNP in the last 8760 hours. Basic Metabolic Panel: Recent Labs  Lab 12/19/19 1028 12/19/19 1413 12/20/19 0546  12/21/19 0631 12/22/19 0548  NA 167* 164* 164* 151* 150*  K 3.6 3.2* 3.7 3.6 4.0  CL 125* 123* 120* 114* 114*  CO2 35* 34* 33* 31 31  GLUCOSE 111* 101* 93 189* 105*  BUN 51* 48* 39* 33* 19  CREATININE 1.05 1.06 1.08 0.95 0.83  CALCIUM 9.8 9.7 10.3 9.1 9.1  MG  --   --  2.6*  --   --    Liver Function Tests: Recent Labs  Lab 12/18/19 2015  AST 27  ALT 42  ALKPHOS 98  BILITOT 0.6  PROT 7.9  ALBUMIN 4.3   No results for input(s): LIPASE, AMYLASE in the last 168 hours. No results for input(s): AMMONIA in the last 168 hours. CBC: Recent Labs  Lab 12/18/19 2015  WBC 10.6*  NEUTROABS 7.8*  HGB 16.0  HCT 58.0*  MCV 95.6  PLT 145*   Cardiac Enzymes: No results for input(s): CKTOTAL, CKMB, CKMBINDEX, TROPONINI in the last 168 hours. BNP: Invalid input(s): POCBNP CBG: No results for input(s): GLUCAP in the last 168 hours. D-Dimer No results for input(s): DDIMER in the last 72 hours. Hgb  A1c No results for input(s): HGBA1C in the last 72 hours. Lipid Profile No results for input(s): CHOL, HDL, LDLCALC, TRIG, CHOLHDL, LDLDIRECT in the last 72 hours. Thyroid function studies No results for input(s): TSH, T4TOTAL, T3FREE, THYROIDAB in the last 72 hours.  Invalid input(s): FREET3 Anemia work up No results for input(s): VITAMINB12, FOLATE, FERRITIN, TIBC, IRON, RETICCTPCT in the last 72 hours. Urinalysis    Component Value Date/Time   COLORURINE YELLOW 06/11/2019 1556   APPEARANCEUR CLEAR 06/11/2019 1556   LABSPEC 1.011 06/11/2019 1556   PHURINE 6.0 06/11/2019 1556   GLUCOSEU NEGATIVE 06/11/2019 1556   HGBUR NEGATIVE 06/11/2019 1556   BILIRUBINUR NEGATIVE 06/11/2019 1556   KETONESUR 20 (A) 06/11/2019 1556   PROTEINUR NEGATIVE 06/11/2019 1556   UROBILINOGEN 0.2 07/26/2014 1325   NITRITE NEGATIVE 06/11/2019 1556   LEUKOCYTESUR NEGATIVE 06/11/2019 1556   Sepsis Labs Invalid input(s): PROCALCITONIN,  WBC,  LACTICIDVEN Microbiology Recent Results (from the past 240 hour(s))  SARS CORONAVIRUS 2 (TAT 6-24 HRS) Nasopharyngeal Nasopharyngeal Swab     Status: None   Collection Time: 12/18/19  9:19 PM   Specimen: Nasopharyngeal Swab  Result Value Ref Range Status   SARS Coronavirus 2 NEGATIVE NEGATIVE Final    Comment: (NOTE) SARS-CoV-2 target nucleic acids are NOT DETECTED. The SARS-CoV-2 RNA is generally detectable in upper and lower respiratory specimens during the acute phase of infection. Negative results do not preclude SARS-CoV-2 infection, do not rule out co-infections with other pathogens, and should not be used as the sole basis for treatment or other patient management decisions. Negative results must be combined with clinical observations, patient history, and epidemiological information. The expected result is Negative. Fact Sheet for Patients: SugarRoll.be Fact Sheet for Healthcare  Providers: https://www.woods-mathews.com/ This test is not yet approved or cleared by the [place] FDA and  has been authorized for detection and/or diagnosis of SARS-CoV-2 by FDA under an Emergency Use Authorization (EUA). This EUA will remain  in effect (meaning this test can be used) for the duration of the COVID-19 declaration under Section 56 4(b)(1) of the Act, 21 U.S.C. section 360bbb-3(b)(1), unless the authorization is terminated or revoked sooner. Performed at [facility], [place] [address], [place], [place] [address]      Time coordinating discharge: 35 minutes  SIGNED:   Rodena Goldmann, DO [facility]  12/22/2019, 8:43 AM  If 7PM-7AM, please contact night-coverage [email or web address]

## 2019-12-22 NOTE — Progress Notes (Signed)
IV removed.  Disconnected from tube feeding and tube flushed with 100 ml free water and clampled.   Was given home meds from pharmacy.  Discharge instructions reviewed.  Cane delivered to room. Taken by wc to short stay entrance and [facility] van picked patient up.

## 2019-12-22 NOTE — TOC Transition Note (Signed)
Transition of Care [facility]) - CM/SW Discharge Note   Patient Details  Name: Thomas [name] MRN: [id number] Date of Birth: Apr 11, 1971  [facility] of [facility]) CM/SW Contact:  Boneta Lucks, RN Phone Number: 12/22/2019, 12:11 PM   Clinical Narrative:   Patient discharging home today.  Juliann Pulse with [facility] is delivering cane to the room shortly. [facility] Van scheduled for 1:30 pick up. RN updated.   Final next level of care: Home/Self Care Barriers to Discharge: Barriers Resolved   Patient Goals and CMS Choice Patient states their goals for this hospitalization and ongoing recovery are:: to go home.      Discharge Placement                Patient to be transferred to facility by: [facility]   Patient and family notified of of transfer: 12/22/19  Discharge Plan and Services   Discharge Planning Services: Other - See comment(transportation)            DME Arranged: Kasandra Knudsen DME Agency: [employer] Date DME Agency Contacted: 12/21/19 Time DME Agency [id number]:[phone] Representative spoke with at DME Agency: Blake Divine      Readmission Risk Interventions No flowsheet data found.

## 2019-12-31 DIAGNOSIS — R42 Dizziness and giddiness: Secondary | ICD-10-CM | POA: Insufficient documentation

## 2019-12-31 DIAGNOSIS — Z934 Other artificial openings of gastrointestinal tract status: Secondary | ICD-10-CM | POA: Insufficient documentation

## 2020-01-13 ENCOUNTER — Other Ambulatory Visit: Payer: Self-pay

## 2020-01-13 ENCOUNTER — Emergency Department (HOSPITAL_COMMUNITY): Payer: Medicaid Other

## 2020-01-13 ENCOUNTER — Encounter (HOSPITAL_COMMUNITY): Payer: Self-pay

## 2020-01-13 ENCOUNTER — Inpatient Hospital Stay (HOSPITAL_COMMUNITY)
Admission: EM | Admit: 2020-01-13 | Discharge: 2020-01-18 | DRG: 640 | Disposition: A | Discharge status: Home Health | Payer: Medicaid Other | Attending: Internal Medicine | Admitting: Internal Medicine

## 2020-01-13 DIAGNOSIS — Q438 Other specified congenital malformations of intestine: Secondary | ICD-10-CM

## 2020-01-13 DIAGNOSIS — E870 Hyperosmolality and hypernatremia: Secondary | ICD-10-CM | POA: Diagnosis not present

## 2020-01-13 DIAGNOSIS — Z79899 Other long term (current) drug therapy: Secondary | ICD-10-CM

## 2020-01-13 DIAGNOSIS — Z880 Allergy status to penicillin: Secondary | ICD-10-CM

## 2020-01-13 DIAGNOSIS — R509 Fever, unspecified: Secondary | ICD-10-CM

## 2020-01-13 DIAGNOSIS — D638 Anemia in other chronic diseases classified elsewhere: Secondary | ICD-10-CM | POA: Diagnosis present

## 2020-01-13 DIAGNOSIS — Q390 Atresia of esophagus without fistula: Secondary | ICD-10-CM | POA: Diagnosis not present

## 2020-01-13 DIAGNOSIS — E860 Dehydration: Secondary | ICD-10-CM | POA: Diagnosis present

## 2020-01-13 DIAGNOSIS — Z20822 Contact with and (suspected) exposure to covid-19: Secondary | ICD-10-CM | POA: Diagnosis present

## 2020-01-13 DIAGNOSIS — Z931 Gastrostomy status: Secondary | ICD-10-CM

## 2020-01-13 DIAGNOSIS — Z886 Allergy status to analgesic agent status: Secondary | ICD-10-CM

## 2020-01-13 DIAGNOSIS — G894 Chronic pain syndrome: Secondary | ICD-10-CM | POA: Diagnosis present

## 2020-01-13 DIAGNOSIS — E861 Hypovolemia: Secondary | ICD-10-CM | POA: Diagnosis present

## 2020-01-13 DIAGNOSIS — Z681 Body mass index (BMI) 19 or less, adult: Secondary | ICD-10-CM

## 2020-01-13 DIAGNOSIS — Z8249 Family history of ischemic heart disease and other diseases of the circulatory system: Secondary | ICD-10-CM

## 2020-01-13 DIAGNOSIS — K223 Perforation of esophagus: Secondary | ICD-10-CM

## 2020-01-13 DIAGNOSIS — E46 Unspecified protein-calorie malnutrition: Secondary | ICD-10-CM | POA: Diagnosis present

## 2020-01-13 DIAGNOSIS — N400 Enlarged prostate without lower urinary tract symptoms: Secondary | ICD-10-CM | POA: Diagnosis present

## 2020-01-13 DIAGNOSIS — E162 Hypoglycemia, unspecified: Secondary | ICD-10-CM | POA: Diagnosis present

## 2020-01-13 DIAGNOSIS — F329 Major depressive disorder, single episode, unspecified: Secondary | ICD-10-CM | POA: Diagnosis present

## 2020-01-13 DIAGNOSIS — D696 Thrombocytopenia, unspecified: Secondary | ICD-10-CM | POA: Diagnosis present

## 2020-01-13 HISTORY — DX: Malignant (primary) neoplasm, unspecified: C80.1

## 2020-01-13 LAB — BASIC METABOLIC PANEL
Anion gap: 10 (ref 5–15)
Anion gap: 13 (ref 5–15)
BUN: 51 mg/dL — ABNORMAL HIGH (ref 6–20)
BUN: 49 mg/dL — ABNORMAL HIGH (ref 6–20)
CO2: 36 mmol/L — ABNORMAL HIGH (ref 22–32)
CO2: 35 mmol/L — ABNORMAL HIGH (ref 22–32)
Calcium: 10.6 mg/dL — ABNORMAL HIGH (ref 8.9–10.3)
Calcium: 10.1 mg/dL (ref 8.9–10.3)
Chloride: 115 mmol/L — ABNORMAL HIGH (ref 98–111)
Chloride: 108 mmol/L (ref 98–111)
Creatinine, Ser: 0.96 mg/dL (ref 0.61–1.24)
Creatinine, Ser: 1.04 mg/dL (ref 0.61–1.24)
GFR calc Af Amer: >60 mL/min (ref >60)
GFR calc Af Amer: >60 mL/min (ref >60)
GFR calc non Af Amer: >60 mL/min (ref >60)
GFR calc non Af Amer: >60 mL/min (ref >60)
Glucose, Bld: 110 mg/dL — ABNORMAL HIGH (ref 70–99)
Glucose, Bld: 103 mg/dL — ABNORMAL HIGH (ref 70–99)
Potassium: 4.1 mmol/L (ref 3.5–5.1)
Potassium: 3.7 mmol/L (ref 3.5–5.1)
Sodium: 161 mmol/L (ref 135–145)
Sodium: 156 mmol/L — ABNORMAL HIGH (ref 135–145)

## 2020-01-13 LAB — URINALYSIS, ROUTINE W REFLEX MICROSCOPIC
Bacteria, UA: NONE SEEN
Bilirubin Urine: NEGATIVE
Glucose, UA: NEGATIVE mg/dL
Hgb urine dipstick: NEGATIVE
Ketones, ur: NEGATIVE mg/dL
Nitrite: NEGATIVE
Protein, ur: 30 mg/dL — AB
Specific Gravity, Urine: 1.027 (ref 1.005–1.030)
pH: 8 (ref 5.0–8.0)

## 2020-01-13 LAB — COMPREHENSIVE METABOLIC PANEL
ALT: 27 U/L (ref 0–44)
AST: 20 U/L (ref 15–41)
Albumin: 4 g/dL (ref 3.5–5.0)
Alkaline Phosphatase: 71 U/L (ref 38–126)
Anion gap: 10 (ref 5–15)
BUN: 55 mg/dL — ABNORMAL HIGH (ref 6–20)
CO2: 39 mmol/L — ABNORMAL HIGH (ref 22–32)
Calcium: 11 mg/dL — ABNORMAL HIGH (ref 8.9–10.3)
Chloride: 115 mmol/L — ABNORMAL HIGH (ref 98–111)
Creatinine, Ser: 1.03 mg/dL (ref 0.61–1.24)
GFR calc Af Amer: >60 mL/min (ref >60)
GFR calc non Af Amer: >60 mL/min (ref >60)
Glucose, Bld: 106 mg/dL — ABNORMAL HIGH (ref 70–99)
Potassium: 4.1 mmol/L (ref 3.5–5.1)
Sodium: 164 mmol/L (ref 135–145)
Total Bilirubin: 0.3 mg/dL (ref 0.3–1.2)
Total Protein: 7.1 g/dL (ref 6.5–8.1)

## 2020-01-13 LAB — CBC WITH DIFFERENTIAL/PLATELET
Abs Immature Granulocytes: 0.02 10*3/uL (ref 0.00–0.07)
Basophils Absolute: 0.1 10*3/uL (ref 0.0–0.1)
Basophils Relative: 1 %
Eosinophils Absolute: 0.1 10*3/uL (ref 0.0–0.5)
Eosinophils Relative: 1 %
HCT: 51.7 % (ref 39.0–52.0)
Hemoglobin: 14.6 g/dL (ref 13.0–17.0)
Immature Granulocytes: 0 %
Lymphocytes Relative: 15 %
Lymphs Abs: 1.5 10*3/uL (ref 0.7–4.0)
MCH: 27.1 pg (ref 26.0–34.0)
MCHC: 28.2 g/dL — ABNORMAL LOW (ref 30.0–36.0)
MCV: 95.9 fL (ref 80.0–100.0)
Monocytes Absolute: 0.7 10*3/uL (ref 0.1–1.0)
Monocytes Relative: 7 %
Neutro Abs: 7.6 10*3/uL (ref 1.7–7.7)
Neutrophils Relative %: 76 %
Platelets: 153 10*3/uL (ref 150–400)
RBC: 5.39 MIL/uL (ref 4.22–5.81)
RDW: 15.8 % — ABNORMAL HIGH (ref 11.5–15.5)
WBC: 10 10*3/uL (ref 4.0–10.5)
nRBC: 0 % (ref 0.0–0.2)

## 2020-01-13 LAB — I-STAT CHEM 8, ED
BUN: 54 mg/dL — ABNORMAL HIGH (ref 6–20)
Calcium, Ion: 1.3 mmol/L (ref 1.15–1.40)
Chloride: 116 mmol/L — ABNORMAL HIGH (ref 98–111)
Creatinine, Ser: 1 mg/dL (ref 0.61–1.24)
Glucose, Bld: 99 mg/dL (ref 70–99)
HCT: 46 % (ref 39.0–52.0)
Hemoglobin: 15.6 g/dL (ref 13.0–17.0)
Potassium: 3.9 mmol/L (ref 3.5–5.1)
Sodium: 162 mmol/L (ref 135–145)
TCO2: 39 mmol/L — ABNORMAL HIGH (ref 22–32)

## 2020-01-13 LAB — RESPIRATORY PANEL BY RT PCR (FLU A&B, COVID)
Influenza A by PCR: NEGATIVE
Influenza B by PCR: NEGATIVE
SARS Coronavirus 2 by RT PCR: NEGATIVE

## 2020-01-13 LAB — PROTIME-INR
INR: 1 (ref 0.8–1.2)
Prothrombin Time: 13 seconds (ref 11.4–15.2)

## 2020-01-13 LAB — LACTIC ACID, PLASMA
Lactic Acid, Venous: 1.9 mmol/L (ref 0.5–1.9)
Lactic Acid, Venous: 1.5 mmol/L (ref 0.5–1.9)

## 2020-01-13 MED ORDER — ALBUTEROL SULFATE (2.5 MG/3ML) 0.083% IN NEBU: 2.5000 mg | INHALATION_SOLUTION | Freq: Four times a day (QID) | RESPIRATORY_TRACT | Status: DC | PRN

## 2020-01-13 MED ORDER — ACETAMINOPHEN 650 MG RE SUPP: 650.0000 mg | Freq: Four times a day (QID) | RECTAL | Status: DC | PRN

## 2020-01-13 MED ORDER — DEXTROSE 5 % IV SOLN: Freq: Once | INTRAVENOUS | Status: AC

## 2020-01-13 MED ORDER — TERAZOSIN HCL 1 MG PO CAPS
2.0000 mg | ORAL_CAPSULE | Freq: Every day | ORAL | Status: DC
  Administered 2020-01-13: 21:00:00 2 mg via ORAL
  Filled 2020-01-13 (×2): qty 2
  Filled 2020-01-13: qty 1
  Filled 2020-01-13: qty 2

## 2020-01-13 MED ORDER — ONDANSETRON HCL 4 MG/2ML IJ SOLN: 4.0000 mg | Freq: Four times a day (QID) | INTRAMUSCULAR | Status: DC | PRN

## 2020-01-13 MED ORDER — OSMOLITE 1.2 CAL PO LIQD: 1000.0000 mL | ORAL | Status: DC

## 2020-01-13 MED ORDER — DEXTROSE 5 % IV SOLN: Freq: Once | INTRAVENOUS | Status: DC

## 2020-01-13 MED ORDER — FREE WATER
375.0000 mL | Status: DC
  Administered 2020-01-13 – 2020-01-14 (×5): 375 mL

## 2020-01-13 MED ORDER — CAMPHOR-MENTHOL-METHYL SAL 3.1-6-10 % EX PTCH: 1.0000 | MEDICATED_PATCH | Freq: Every day | CUTANEOUS | Status: DC

## 2020-01-13 MED ORDER — FENTANYL CITRATE (PF) 100 MCG/2ML IJ SOLN
50.0000 ug | Freq: Once | INTRAMUSCULAR | Status: AC
  Administered 2020-01-13: 50 ug via INTRAVENOUS
  Filled 2020-01-13: qty 2

## 2020-01-13 MED ORDER — HYPROMELLOSE (GONIOSCOPIC) 2.5 % OP SOLN
1.0000 [drp] | Freq: Two times a day (BID) | OPHTHALMIC | Status: DC | PRN
  Administered 2020-01-14: 1 [drp] via OPHTHALMIC
  Filled 2020-01-13: qty 15

## 2020-01-13 MED ORDER — DEXTROSE 5 % IV SOLN: INTRAVENOUS | Status: DC

## 2020-01-13 MED ORDER — TRAZODONE HCL 50 MG PO TABS
50.0000 mg | ORAL_TABLET | Freq: Every day | ORAL | Status: DC
  Administered 2020-01-13 – 2020-01-14 (×2): 50 mg via ORAL
  Filled 2020-01-13 (×3): qty 1

## 2020-01-13 MED ORDER — TWOCAL HN PO LIQD
1185.0000 mL | ORAL | Status: DC
  Administered 2020-01-14 – 2020-01-15 (×2): 1185 mL
  Filled 2020-01-13 (×5): qty 2000

## 2020-01-13 MED ORDER — TWOCAL HN PO LIQD: 1185.0000 mL | Freq: Every day | ORAL | Status: DC

## 2020-01-13 MED ORDER — ONDANSETRON HCL 4 MG PO TABS: 4.0000 mg | ORAL_TABLET | Freq: Four times a day (QID) | ORAL | Status: DC | PRN

## 2020-01-13 MED ORDER — ACETAMINOPHEN 325 MG PO TABS
650.0000 mg | ORAL_TABLET | Freq: Four times a day (QID) | ORAL | Status: DC | PRN
  Administered 2020-01-13 – 2020-01-14 (×3): 650 mg via ORAL
  Filled 2020-01-13 (×3): qty 2

## 2020-01-13 MED ORDER — ENOXAPARIN SODIUM 30 MG/0.3ML ~~LOC~~ SOLN
30.0000 mg | SUBCUTANEOUS | Status: DC
  Filled 2020-01-13 (×2): qty 0.3

## 2020-01-13 NOTE — ED Notes (Signed)
Critical NA 164 given to [facility]
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
## 2020-01-13 NOTE — Progress Notes (Signed)
Patient arrived to unit via stretcher awake alert and oriented, soaked and wet from ostomy on Rt. Side of chest. Material management did not have supply in stock. Patient's own supply was used to change ostomy bag. Will continue to monitor .
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
## 2020-01-13 NOTE — ED Triage Notes (Signed)
Pt reports increased anxiety over the past week, states he thinks he is having a panic attack. When asked what could be causing his anxiety pt states "My wife doesn't know when to shut her mouth". Pt tachycardic 125. Pt seems anxious in triage, requesting pain medication.

## 2020-01-13 NOTE — Progress Notes (Signed)
CRITICAL VALUE ALERT  Critical Value:  161  Date & Time Notied:  01/13/20 1709  Provider Notified: Tamala Julian MD  Orders Received/Actions taken: No new orders

## 2020-01-13 NOTE — ED Notes (Signed)
Pt flushed peg tube 150cc's of water at this time.

## 2020-01-13 NOTE — ED Notes (Signed)
Called 3c to give report, no answer at this time. Will call back shortly.

## 2020-01-14 DIAGNOSIS — D696 Thrombocytopenia, unspecified: Secondary | ICD-10-CM | POA: Diagnosis present

## 2020-01-14 DIAGNOSIS — Z20822 Contact with and (suspected) exposure to covid-19: Secondary | ICD-10-CM | POA: Diagnosis present

## 2020-01-14 DIAGNOSIS — Z8249 Family history of ischemic heart disease and other diseases of the circulatory system: Secondary | ICD-10-CM | POA: Diagnosis not present

## 2020-01-14 DIAGNOSIS — Q438 Other specified congenital malformations of intestine: Secondary | ICD-10-CM | POA: Diagnosis not present

## 2020-01-14 DIAGNOSIS — Z886 Allergy status to analgesic agent status: Secondary | ICD-10-CM | POA: Diagnosis not present

## 2020-01-14 DIAGNOSIS — E46 Unspecified protein-calorie malnutrition: Secondary | ICD-10-CM | POA: Diagnosis present

## 2020-01-14 DIAGNOSIS — Z79899 Other long term (current) drug therapy: Secondary | ICD-10-CM | POA: Diagnosis not present

## 2020-01-14 DIAGNOSIS — D638 Anemia in other chronic diseases classified elsewhere: Secondary | ICD-10-CM | POA: Diagnosis present

## 2020-01-14 DIAGNOSIS — Z931 Gastrostomy status: Secondary | ICD-10-CM | POA: Diagnosis not present

## 2020-01-14 DIAGNOSIS — N400 Enlarged prostate without lower urinary tract symptoms: Secondary | ICD-10-CM | POA: Diagnosis present

## 2020-01-14 DIAGNOSIS — E162 Hypoglycemia, unspecified: Secondary | ICD-10-CM | POA: Diagnosis present

## 2020-01-14 DIAGNOSIS — E870 Hyperosmolality and hypernatremia: Secondary | ICD-10-CM | POA: Diagnosis not present

## 2020-01-14 DIAGNOSIS — G894 Chronic pain syndrome: Secondary | ICD-10-CM | POA: Diagnosis present

## 2020-01-14 DIAGNOSIS — F329 Major depressive disorder, single episode, unspecified: Secondary | ICD-10-CM | POA: Diagnosis present

## 2020-01-14 DIAGNOSIS — E861 Hypovolemia: Secondary | ICD-10-CM | POA: Diagnosis present

## 2020-01-14 DIAGNOSIS — Z681 Body mass index (BMI) 19 or less, adult: Secondary | ICD-10-CM | POA: Diagnosis not present

## 2020-01-14 DIAGNOSIS — R509 Fever, unspecified: Secondary | ICD-10-CM | POA: Diagnosis not present

## 2020-01-14 DIAGNOSIS — E860 Dehydration: Secondary | ICD-10-CM | POA: Diagnosis present

## 2020-01-14 DIAGNOSIS — Z880 Allergy status to penicillin: Secondary | ICD-10-CM | POA: Diagnosis not present

## 2020-01-14 DIAGNOSIS — Q390 Atresia of esophagus without fistula: Secondary | ICD-10-CM | POA: Diagnosis not present

## 2020-01-14 LAB — CBC
HCT: 35.9 % — ABNORMAL LOW (ref 39.0–52.0)
HCT: 37.1 % — ABNORMAL LOW (ref 39.0–52.0)
Hemoglobin: 10.7 g/dL — ABNORMAL LOW (ref 13.0–17.0)
Hemoglobin: 11 g/dL — ABNORMAL LOW (ref 13.0–17.0)
MCH: 27.6 pg (ref 26.0–34.0)
MCH: 27.4 pg (ref 26.0–34.0)
MCHC: 29.8 g/dL — ABNORMAL LOW (ref 30.0–36.0)
MCHC: 29.6 g/dL — ABNORMAL LOW (ref 30.0–36.0)
MCV: 92.5 fL (ref 80.0–100.0)
MCV: 92.3 fL (ref 80.0–100.0)
Platelets: 124 10*3/uL — ABNORMAL LOW (ref 150–400)
Platelets: 113 10*3/uL — ABNORMAL LOW (ref 150–400)
RBC: 3.88 MIL/uL — ABNORMAL LOW (ref 4.22–5.81)
RBC: 4.02 MIL/uL — ABNORMAL LOW (ref 4.22–5.81)
RDW: 15.3 % (ref 11.5–15.5)
RDW: 14.8 % (ref 11.5–15.5)
WBC: 9.5 10*3/uL (ref 4.0–10.5)
WBC: 7.8 10*3/uL (ref 4.0–10.5)
nRBC: 0 % (ref 0.0–0.2)
nRBC: 0 % (ref 0.0–0.2)

## 2020-01-14 LAB — GLUCOSE, CAPILLARY
Glucose-Capillary: 109 mg/dL — ABNORMAL HIGH (ref 70–99)
Glucose-Capillary: 112 mg/dL — ABNORMAL HIGH (ref 70–99)
Glucose-Capillary: 113 mg/dL — ABNORMAL HIGH (ref 70–99)
Glucose-Capillary: 116 mg/dL — ABNORMAL HIGH (ref 70–99)
Glucose-Capillary: 67 mg/dL — ABNORMAL LOW (ref 70–99)

## 2020-01-14 LAB — BASIC METABOLIC PANEL
Anion gap: 9 (ref 5–15)
BUN: 42 mg/dL — ABNORMAL HIGH (ref 6–20)
CO2: 34 mmol/L — ABNORMAL HIGH (ref 22–32)
Calcium: 9.1 mg/dL (ref 8.9–10.3)
Chloride: 103 mmol/L (ref 98–111)
Creatinine, Ser: 0.95 mg/dL (ref 0.61–1.24)
GFR calc Af Amer: >60 mL/min (ref >60)
GFR calc non Af Amer: >60 mL/min (ref >60)
Glucose, Bld: 119 mg/dL — ABNORMAL HIGH (ref 70–99)
Potassium: 3.1 mmol/L — ABNORMAL LOW (ref 3.5–5.1)
Sodium: 146 mmol/L — ABNORMAL HIGH (ref 135–145)

## 2020-01-14 LAB — IRON AND TIBC
Iron: 96 ug/dL (ref 45–182)
Saturation Ratios: 35 % (ref 17.9–39.5)
TIBC: 274 ug/dL (ref 250–450)
UIBC: 178 ug/dL

## 2020-01-14 LAB — FERRITIN: Ferritin: 25 ng/mL (ref 24–336)

## 2020-01-14 LAB — SODIUM
Sodium: 146 mmol/L — ABNORMAL HIGH (ref 135–145)
Sodium: 147 mmol/L — ABNORMAL HIGH (ref 135–145)
Sodium: 149 mmol/L — ABNORMAL HIGH (ref 135–145)

## 2020-01-14 LAB — MAGNESIUM: Magnesium: 2.3 mg/dL (ref 1.7–2.4)

## 2020-01-14 LAB — VITAMIN B12: Vitamin B-12: 288 pg/mL (ref 180–914)

## 2020-01-14 MED ORDER — POTASSIUM CHLORIDE CRYS ER 20 MEQ PO TBCR
40.0000 meq | EXTENDED_RELEASE_TABLET | ORAL | Status: AC
  Administered 2020-01-14 (×2): 40 meq via ORAL
  Filled 2020-01-14 (×2): qty 2

## 2020-01-14 MED ORDER — FREE WATER
375.0000 mL | Freq: Three times a day (TID) | Status: DC
  Administered 2020-01-14 – 2020-01-15 (×3): 375 mL

## 2020-01-14 MED ORDER — TAMSULOSIN HCL 0.4 MG PO CAPS
0.4000 mg | ORAL_CAPSULE | Freq: Once | ORAL | Status: AC
  Administered 2020-01-14: 0.4 mg via ORAL
  Filled 2020-01-14: qty 1

## 2020-01-14 MED ORDER — OXYCODONE HCL 5 MG PO TABS
5.0000 mg | ORAL_TABLET | Freq: Four times a day (QID) | ORAL | Status: DC | PRN
  Administered 2020-01-14 – 2020-01-15 (×4): 5 mg via ORAL
  Filled 2020-01-14 (×5): qty 1

## 2020-01-14 NOTE — Progress Notes (Signed)
PROGRESS NOTE    Thomas [name]  [id number] DOB: 12/03/1971 DOA: 01/13/2020 PCP: Sinda Du, MD   Brief Narrative:  HPI: Thomas [name] is a 49 y.o. male with medical history significant of esophageal atresia s/p retrosternal colonic interposition with history of multiple surgeries with recent partial esophageal resection and creation of spit fistula in 06/2019 and high output GJ tube.  Patient presents with complaints of out of it.  Records note patient was admitted at [facility] from 1/11-1/15 and then at at [facility] from 1/23-1/29 for hypernatremia.  After discharge home patient was supposed to have been arranged home health, but he states they have not  come out because he keeps coming back into the hospital.  Patient notes that he had not been giving himself free water as they had previously instructed him.  He complains of feeling anxious and notes that his ostomy needs to be changes skin leaking.  ED Course: Patient patient noted to have a temperature of 100.3 F, pulse 95-123, blood pressure 89/63-105/73, and all other vital signs maintained labs significant for sodium of 164, BUN 55, and creatinine 1.03.  Patient was given 1 L of D5W.  Gastrostomy tube was placed.Marland Kitchen  TRH called to admit.  Assessment & Plan:   Principal Problem:   Hypernatremia Active Problems:   Low grade fever   Esophageal atresia   Gastrostomy tube in place [facility])   Chronic hyponatremia: Presented with sodium of 164.  Has recent hospitalization at [facility] for the same.  Was started on dextrose at 100 cc/h.  Goal to correct sodium no more than 10 mEq in 24 hours since this is chronic hypernatremia.  Sodium rapidly improved and down to 146 at this point in time.  Discontinue IV fluids.  Monitor sodium every 6 hours.  Apparently, patient is supposed to do free water flushes at home which he does not do.  He is getting 375 mL of free water flushes every 4 hours.  We will reduce frequency to every 8  hours.  Esophageal atresia: Patient with correction of esophageal atresia with colonic interposition and more recent partial esophageal resection and  spit fistula revision in 06/2019.   Suspect ostomy appears to be leaking and he has some erythema surrounding the site.  That seems to be allergic to me.  Ostomy care consulted.  Low-grade fever: Patient presented with temperature of 100.3 F and tachycardia.  Tachycardia thought secondary to dehydration.  WBC at lactic acid was reassuring.  Has remained afebrile.  Monitor off of antibiotics and follow blood culture.  G-tube: Present on admission.  Patient is G-tube dependent for feeds and hydration.  -Continue tube feeds per G-tube  Depression: Patient reports feeling depressed for long time.  Requesting medication.  We will start him on SSRI as he is still trying to figure out to see his PCP.  Bilateral lower extremity pain: Complains of bilateral lower extremity pain but there is no calf tenderness.  Requests pain medications as Tylenol does not work.  We will start him on oxycodone.  Acute normocytic anemia: Hemoglobin dropped from 14.6 yesterday to 10.7 today.  No active bleeding.  Check iron studies, folate vitamin B12 and fecal occult blood test.  Repeat H&H later today.  DVT prophylaxis: Lovenox Code Status: Full code Family Communication:  None present at bedside.  Plan of care discussed with patient in length and he verbalized understanding and agreed with it. Patient is from: Home Disposition Plan: Home Barriers to discharge: Clinical  improvement   Estimated body mass index is 15.98 kg/m as calculated from the following:   Height as of 12/18/19: 5\' 6"  (1.676 m).   Weight as of this encounter: 44.9 kg.      Nutritional status:               Consultants:   None  Procedures:   None  Antimicrobials:   None   Subjective: Seen and examined.  Feels depressed and bilateral lower extremity pain.  No other  specific complaint.  Requesting pain medications.  Objective: Vitals:   01/14/20 0450 01/14/20 0617 01/14/20 0700 01/14/20 0754  BP: (!) 84/52 (!) 88/59  (!) 83/55  Pulse: 68 69  61  Resp:    19  Temp:    97.6 F (36.4 C)  TempSrc:      SpO2: 98%   98%  Weight:   44.9 kg     Intake/Output Summary (Last 24 hours) at 01/14/2020 1037 Last data filed at 01/14/2020 0910 Gross per 24 hour  Intake 4880 ml  Output 7100 ml  Net -2220 ml   Filed Weights   01/13/20 1430 01/14/20 0700  Weight: 43.8 kg 44.9 kg    Examination:  General exam: Appears calm and comfortable but cachectic Respiratory system: Clear to auscultation. Respiratory effort normal. Cardiovascular system: S1 & S2 heard, RRR. No JVD, murmurs, rubs, gallops or clicks. No pedal edema.  Left ostomy to the right chest wall. Gastrointestinal system: Abdomen is nondistended, soft and nontender. No organomegaly or masses felt. Normal bowel sounds heard.  PEG tube in place.  Surrounding erythema which is blanching, nontender and no warmth. Central nervous system: Alert and oriented. No focal neurological deficits. Extremities: Symmetric 5 x 5 power. Psychiatry: Judgement and insight appear normal. Mood & affect appropriate.    Data Reviewed: I have personally reviewed following labs and imaging studies  CBC: Recent Labs  Lab 01/13/20 1252 01/13/20 1255 01/14/20 0616  WBC 10.0  --  9.5  NEUTROABS 7.6  --   --   HGB 14.6 15.6 10.7*  HCT 51.7 46.0 35.9*  MCV 95.9  --  92.5  PLT 153  --  [id number]*   Basic Metabolic Panel: Recent Labs  Lab 01/13/20 1252 01/13/20 1255 01/13/20 1623 01/13/20 2006 01/14/20 0616  NA 164* 162* 161* 156* 146*  K 4.1 3.9 4.1 3.7 3.1*  CL 115* 116* 115* 108 103  CO2 39*  --  36* 35* 34*  GLUCOSE 106* 99 110* 103* 119*  BUN 55* 54* 51* 49* 42*  CREATININE 1.03 1.00 0.96 1.04 0.95  CALCIUM 11.0*  --  10.6* 10.1 9.1  MG  --   --   --   --  2.3   GFR: Estimated Creatinine Clearance: 60.4  mL/min (by C-G formula based on SCr of 0.95 mg/dL). Liver Function Tests: Recent Labs  Lab 01/13/20 1252  AST 20  ALT 27  ALKPHOS 71  BILITOT 0.3  PROT 7.1  ALBUMIN 4.0   No results for input(s): LIPASE, AMYLASE in the last 168 hours. No results for input(s): AMMONIA in the last 168 hours. Coagulation Profile: Recent Labs  Lab 01/13/20 1252  INR 1.0   Cardiac Enzymes: No results for input(s): CKTOTAL, CKMB, CKMBINDEX, TROPONINI in the last 168 hours. BNP (last 3 results) No results for input(s): PROBNP in the last 8760 hours. HbA1C: No results for input(s): HGBA1C in the last 72 hours. CBG: Recent Labs  Lab 01/14/20 0016 01/14/20 0407  GLUCAP  109* 113*   Lipid Profile: No results for input(s): CHOL, HDL, LDLCALC, TRIG, CHOLHDL, LDLDIRECT in the last 72 hours. Thyroid Function Tests: No results for input(s): TSH, T4TOTAL, FREET4, T3FREE, THYROIDAB in the last 72 hours. Anemia Panel: No results for input(s): VITAMINB12, FOLATE, FERRITIN, TIBC, IRON, RETICCTPCT in the last 72 hours. Sepsis Labs: Recent Labs  Lab 01/13/20 1252 01/13/20 1524  LATICACIDVEN 1.9 1.5    Recent Results (from the past 240 hour(s))  Respiratory Panel by RT PCR (Flu A&B, Covid) - Nasopharyngeal Swab     Status: None   Collection Time: 01/13/20 12:38 PM   Specimen: Nasopharyngeal Swab  Result Value Ref Range Status   SARS Coronavirus 2 by RT PCR NEGATIVE NEGATIVE Final    Comment: (NOTE) SARS-CoV-2 target nucleic acids are NOT DETECTED. The SARS-CoV-2 RNA is generally detectable in upper respiratoy specimens during the acute phase of infection. The lowest concentration of SARS-CoV-2 viral copies this assay can detect is 131 copies/mL. A negative result does not preclude SARS-Cov-2 infection and should not be used as the sole basis for treatment or other patient management decisions. A negative result may occur with  improper specimen collection/handling, submission of specimen  other than nasopharyngeal swab, presence of viral mutation(s) within the areas targeted by this assay, and inadequate number of viral copies (<131 copies/mL). A negative result must be combined with clinical observations, patient history, and epidemiological information. The expected result is Negative. Fact Sheet for Patients:  PinkCheek.be Fact Sheet for Healthcare Providers:  GravelBags.it This test is not yet ap proved or cleared by the [place] FDA and  has been authorized for detection and/or diagnosis of SARS-CoV-2 by FDA under an Emergency Use Authorization (EUA). This EUA will remain  in effect (meaning this test can be used) for the duration of the COVID-19 declaration under Section 564(b)(1) of the Act, 21 U.S.C. section 360bbb-3(b)(1), unless the authorization is terminated or revoked sooner.    Influenza A by PCR NEGATIVE NEGATIVE Final   Influenza B by PCR NEGATIVE NEGATIVE Final    Comment: (NOTE) The Xpert Xpress SARS-CoV-2/FLU/RSV assay is intended as an aid in  the diagnosis of influenza from Nasopharyngeal swab specimens and  should not be used as a sole basis for treatment. Nasal washings and  aspirates are unacceptable for Xpert Xpress SARS-CoV-2/FLU/RSV  testing. Fact Sheet for Patients: PinkCheek.be Fact Sheet for Healthcare Providers: GravelBags.it This test is not yet approved or cleared by the [place] FDA and  has been authorized for detection and/or diagnosis of SARS-CoV-2 by  FDA under an Emergency Use Authorization (EUA). This EUA will remain  in effect (meaning this test can be used) for the duration of the  Covid-19 declaration under Section 564(b)(1) of the Act, 21  U.S.C. section 360bbb-3(b)(1), unless the authorization is  terminated or revoked. Performed at [facility], [place] [address] [place], [place] [address]    Culture, blood (routine x 2)     Status: None (Preliminary result)   Collection Time: 01/13/20 12:50 PM   Specimen: BLOOD  Result Value Ref Range Status   Specimen Description BLOOD LEFT ANTECUBITAL  Final   Special Requests   Final    BOTTLES DRAWN AEROBIC AND ANAEROBIC Blood Culture adequate volume Performed at [facility], [place] [address], [place], [place] [address]    Culture NO GROWTH < 24 HOURS  Final   Report Status PENDING  Incomplete  Culture, blood (routine x 2)  Status: None (Preliminary result)   Collection Time: 01/13/20 12:55 PM   Specimen: BLOOD  Result Value Ref Range Status   Specimen Description BLOOD RIGHT ANTECUBITAL  Final   Special Requests   Final    BOTTLES DRAWN AEROBIC AND ANAEROBIC Blood Culture results may not be optimal due to an inadequate volume of blood received in culture bottles Performed at [facility] [address], [place], [place] [address]    Culture NO GROWTH < 24 HOURS  Final   Report Status PENDING  Incomplete      Radiology Studies: DG Chest Port 1 View  Addendum Date: 01/13/2020   ADDENDUM REPORT: 01/13/2020 13:39 ADDENDUM: After viewing the abdominal x-ray obtained concurrently, the apparent calcifications beneath the RIGHT hemidiaphragm in fact represents extravasated contrast material/barium which is present throughout the upper abdomen. Electronically Signed   By: Evangeline Dakin M.D.   On: 01/13/2020 13:39   Result Date: 01/13/2020 CLINICAL DATA:  49 year old presenting with extreme anxiety and possible panic attack. EXAM: PORTABLE CHEST 1 VIEW COMPARISON:  08/17/2019 and earlier including CT chest 08/07/2019. FINDINGS: Cardiomediastinal silhouette unremarkable and unchanged. Hyperinflation and emphysematous changes throughout both lungs as noted previously. Chronic pleuroparenchymal scarring at the RIGHT base at the site of a prior pneumonia. Lungs otherwise clear. Pulmonary vascularity normal. No visible pleural  effusions currently. Calcifications beneath the RIGHT hemidiaphragm shown on prior CT to likely involve the liver capsule. IMPRESSION: No acute cardiopulmonary disease. COPD/emphysema. Chronic pleuroparenchymal scarring at the RIGHT base. Electronically Signed: By: Evangeline Dakin M.D. On: 01/13/2020 13:32   DG Abd Portable 1 View  Result Date: 01/13/2020 CLINICAL DATA:  49 year old with an indwelling gastrostomy tube. EXAM: PORTABLE ABDOMEN - 1 VIEW COMPARISON:  CT abdomen and pelvis 07/07/2019 and earlier. FINDINGS: The gastrostomy tube projects over the expected location of the mid body of the stomach. There is no jejunal catheter currently as was seen on the prior CT 07/07/2019. Visualized upper abdominal bowel gas pattern unremarkable. Extravasated contrast material/barium throughout the visualized upper abdomen as noted on that CT. IVC filter as noted previously. IMPRESSION: Gastrostomy tube projects over the expected location of the mid body of the stomach. Electronically Signed   By: Evangeline Dakin M.D.   On: 01/13/2020 13:37    Scheduled Meds:  enoxaparin (LOVENOX) injection  30 mg Subcutaneous Q24H   free water  375 mL Per Tube Q4H   potassium chloride  40 mEq Oral Q4H   terazosin  2 mg Oral QHS   traZODone  50 mg Oral QHS   TwoCal HN  1,185 mL Per Tube Q24H   Continuous Infusions:   LOS: 0 days   Time spent: 35 minutes   Darliss Cheney, MD [facility]  01/14/2020, 10:37 AM   To contact the attending provider between 7A-7P or the covering provider during after hours 7P-7A, please log into the web site [email or web address].

## 2020-01-15 LAB — GLUCOSE, CAPILLARY
Glucose-Capillary: 121 mg/dL — ABNORMAL HIGH (ref 70–99)
Glucose-Capillary: 72 mg/dL (ref 70–99)
Glucose-Capillary: 75 mg/dL (ref 70–99)
Glucose-Capillary: 77 mg/dL (ref 70–99)
Glucose-Capillary: 77 mg/dL (ref 70–99)
Glucose-Capillary: 88 mg/dL (ref 70–99)
Glucose-Capillary: 99 mg/dL (ref 70–99)

## 2020-01-15 LAB — CBC
HCT: 38 % — ABNORMAL LOW (ref 39.0–52.0)
Hemoglobin: 11 g/dL — ABNORMAL LOW (ref 13.0–17.0)
MCH: 27 pg (ref 26.0–34.0)
MCHC: 28.9 g/dL — ABNORMAL LOW (ref 30.0–36.0)
MCV: 93.4 fL (ref 80.0–100.0)
Platelets: 114 10*3/uL — ABNORMAL LOW (ref 150–400)
RBC: 4.07 MIL/uL — ABNORMAL LOW (ref 4.22–5.81)
RDW: 15.1 % (ref 11.5–15.5)
WBC: 7 10*3/uL (ref 4.0–10.5)
nRBC: 0.3 % — ABNORMAL HIGH (ref 0.0–0.2)

## 2020-01-15 LAB — BASIC METABOLIC PANEL
Anion gap: 7 (ref 5–15)
BUN: 26 mg/dL — ABNORMAL HIGH (ref 6–20)
CO2: 34 mmol/L — ABNORMAL HIGH (ref 22–32)
Calcium: 9.7 mg/dL (ref 8.9–10.3)
Chloride: 110 mmol/L (ref 98–111)
Creatinine, Ser: 0.75 mg/dL (ref 0.61–1.24)
GFR calc Af Amer: >60 mL/min (ref >60)
GFR calc non Af Amer: >60 mL/min (ref >60)
Glucose, Bld: 95 mg/dL (ref 70–99)
Potassium: 3.9 mmol/L (ref 3.5–5.1)
Sodium: 151 mmol/L — ABNORMAL HIGH (ref 135–145)

## 2020-01-15 LAB — SODIUM: Sodium: 153 mmol/L — ABNORMAL HIGH (ref 135–145)

## 2020-01-15 MED ORDER — ACETAMINOPHEN 500 MG PO TABS: 500.0000 mg | ORAL_TABLET | Freq: Four times a day (QID) | ORAL | Status: DC

## 2020-01-15 MED ORDER — ONDANSETRON HCL 4 MG/2ML IJ SOLN: 4.0000 mg | Freq: Four times a day (QID) | INTRAMUSCULAR | Status: DC | PRN

## 2020-01-15 MED ORDER — TRAMADOL HCL 50 MG PO TABS
50.0000 mg | ORAL_TABLET | Freq: Four times a day (QID) | ORAL | Status: DC | PRN
  Administered 2020-01-15 – 2020-01-16 (×4): 50 mg
  Filled 2020-01-15 (×4): qty 1

## 2020-01-15 MED ORDER — FREE WATER
375.0000 mL | Freq: Four times a day (QID) | Status: DC
  Administered 2020-01-15 – 2020-01-16 (×4): 375 mL

## 2020-01-15 MED ORDER — TERAZOSIN HCL 1 MG PO CAPS
2.0000 mg | ORAL_CAPSULE | Freq: Every day | ORAL | Status: DC
  Administered 2020-01-15 – 2020-01-18 (×4): 2 mg
  Filled 2020-01-15 (×4): qty 2

## 2020-01-15 MED ORDER — ONDANSETRON HCL 4 MG PO TABS: 4.0000 mg | ORAL_TABLET | Freq: Four times a day (QID) | ORAL | Status: DC | PRN

## 2020-01-15 MED ORDER — TAMSULOSIN HCL 0.4 MG PO CAPS: 0.4000 mg | ORAL_CAPSULE | Freq: Every day | ORAL | Status: DC

## 2020-01-15 MED ORDER — ACETAMINOPHEN 500 MG PO TABS
500.0000 mg | ORAL_TABLET | Freq: Four times a day (QID) | ORAL | Status: DC
  Administered 2020-01-15 – 2020-01-18 (×10): 500 mg
  Filled 2020-01-15 (×9): qty 1

## 2020-01-15 MED ORDER — TRAZODONE HCL 50 MG PO TABS
50.0000 mg | ORAL_TABLET | Freq: Every day | ORAL | Status: DC
  Administered 2020-01-15 – 2020-01-18 (×4): 50 mg
  Filled 2020-01-15 (×4): qty 1

## 2020-01-15 MED ORDER — TRAMADOL HCL 50 MG PO TABS: 50.0000 mg | ORAL_TABLET | Freq: Four times a day (QID) | ORAL | Status: DC | PRN

## 2020-01-15 MED ORDER — IBUPROFEN 200 MG PO TABS
200.0000 mg | ORAL_TABLET | ORAL | Status: DC | PRN
  Administered 2020-01-18: 200 mg
  Filled 2020-01-15: qty 1

## 2020-01-15 MED ORDER — IBUPROFEN 200 MG PO TABS: 200.0000 mg | ORAL_TABLET | ORAL | Status: DC | PRN

## 2020-01-15 NOTE — Progress Notes (Signed)
PROGRESS NOTE    Thomas [name]  [id number] DOB: [birth year] DOA: 01/13/2020 PCP: Sinda Du, MD    Brief Narrative:  49 year old male who presented after loosing G tube.  His past medical history includes esophageal atresia status post retrosternal colonic interposition with history of multiple surgeries recent partial esophageal resection and creation of spit fistula in 07/20. High output GJ tube.  Hospitalization 1/11-1/15 at [facility] and 1/23-1/29 at [facility] for hypernatremia.  On his initial physical examination his temperature was 100.3 F, pulse rate 95-123, blood pressure 89/63.  Are clear to auscultation bilaterally, heart S1-S2 present rhythmic, soft abdomen, GJ tube in place, no lower extremity edema. Sodium 164, potassium 4.1, chloride 115, bicarb 39, glucose 106, BUN 55, creatinine 1.0  Patient was admitted to the hospital working diagnosis of recurrent hypernatremia.   Assessment & Plan:   Principal Problem:   Hypernatremia Active Problems:   Low grade fever   Esophageal atresia   Gastrostomy tube in place ([place])   1. Hypernatremia/ hypovolemic. Na down to 153, will continue with free water flushed 375 ml qid/ continue tube feeding and follow up electrolytes in am. Patient has been not compliant with water flushes at home. No nausea or vomiting.  2. Esophageal atresia. Colonic interposition and spit fistula. Seems to be working well. No leak at ostomy site. G tube in place and no signs of malfunctioning, continue tube feeding and medications per tube. Consult nutrition for assessment.   3. Depression. Patient complains of being depressed, will consult psychiatry for further recommendations.   4. Acute on chronic pain syndrome. Will add scheduled acetaminophen, and as needed ibuprofen. Will hold on oxycodone and will use as needed tramadol for severe pain.   5. Chronic anemia. Continue close follow up of cell count. Multifactorial anemia.   6. BPH.  Continue with terazosin.    DVT prophylaxis: enoxaparin   Code Status: full Family Communication: I spoke with patient's mother at the bedside, we talked in detail about patient's condition, plan of care and prognosis and all questions were addressed.  Disposition Plan/ discharge barriers: improvement of serum NA 140 range. Patient will need outpatient primary care provider.    Subjective: Patient continue to have difficulty passing urine, back and right shoulder pain, at home not using water flushes. Today with no nausea or vomiting, very weak and deconditioned.   Objective: Vitals:   01/15/20 0409 01/15/20 0641 01/15/20 0727 01/15/20 1128  BP: (!) 135/51  (!) 108/55 (!) 107/59  Pulse: 61  66 74  Resp: 20  18 18   Temp:   98.2 F (36.8 C) 98.3 F (36.8 C)  TempSrc:   Oral Oral  SpO2: 98%  100% 99%  Weight:  45.1 kg      Intake/Output Summary (Last 24 hours) at 01/15/2020 1233 Last data filed at 01/15/2020 1100 Gross per 24 hour  Intake 2140 ml  Output 11275 ml  Net -9135 ml   Filed Weights   01/13/20 1430 01/14/20 0700 01/15/20 0641  Weight: 43.8 kg 44.9 kg 45.1 kg    Examination:   General: Not in pain or dyspnea, deconditioned  Neurology: Awake and alert, non focal  E ENT: no pallor, no icterus, oral mucosa moist/  Cardiovascular: No JVD. S1-S2 present, rhythmic, no gallops, rubs, or murmurs. No lower extremity edema. Pulmonary: positive breath sounds bilaterally, adequate air movement, no wheezing, rhonchi or rales. Gastrointestinal. Abdomen with no organomegaly, non tender, no rebound or guarding/ G tube in place.  Skin.  No rashes/ stoma at the upper mid chest with collecting bag in place.  Musculoskeletal: no joint deformities     Data Reviewed: I have personally reviewed following labs and imaging studies  CBC: Recent Labs  Lab 01/13/20 1252 01/13/20 1255 01/14/20 0616 01/14/20 1835 01/15/20 0621  WBC 10.0  --  9.5 7.8 7.0  NEUTROABS 7.6  --   --   --    --   HGB 14.6 15.6 10.7* 11.0* 11.0*  HCT 51.7 46.0 35.9* 37.1* 38.0*  MCV 95.9  --  92.5 92.3 93.4  PLT 153  --  124* 113* [id number]*   Basic Metabolic Panel: Recent Labs  Lab 01/13/20 1252 01/13/20 1252 01/13/20 1255 01/13/20 1255 01/13/20 1623 01/13/20 1623 01/13/20 2006 01/13/20 2006 01/14/20 0616 01/14/20 0616 01/14/20 1115 01/14/20 1835 01/14/20 2225 01/15/20 0621 01/15/20 1041  NA 164*   < > 162*   < > 161*   < > 156*   < > 146*   < > 146* 147* 149* 151* 153*  K 4.1   < > 3.9  --  4.1  --  3.7  --  3.1*  --   --   --   --  3.9  --   CL 115*   < > 116*  --  115*  --  108  --  103  --   --   --   --  110  --   CO2 39*  --   --   --  36*  --  35*  --  34*  --   --   --   --  34*  --   GLUCOSE 106*   < > 99  --  110*  --  103*  --  119*  --   --   --   --  95  --   BUN 55*   < > 54*  --  51*  --  49*  --  42*  --   --   --   --  26*  --   CREATININE 1.03   < > 1.00  --  0.96  --  1.04  --  0.95  --   --   --   --  0.75  --   CALCIUM 11.0*  --   --   --  10.6*  --  10.1  --  9.1  --   --   --   --  9.7  --   MG  --   --   --   --   --   --   --   --  2.3  --   --   --   --   --   --    < > = values in this interval not displayed.   GFR: Estimated Creatinine Clearance: 72 mL/min (by C-G formula based on SCr of 0.75 mg/dL). Liver Function Tests: Recent Labs  Lab 01/13/20 1252  AST 20  ALT 27  ALKPHOS 71  BILITOT 0.3  PROT 7.1  ALBUMIN 4.0   No results for input(s): LIPASE, AMYLASE in the last 168 hours. No results for input(s): AMMONIA in the last 168 hours. Coagulation Profile: Recent Labs  Lab 01/13/20 1252  INR 1.0   Cardiac Enzymes: No results for input(s): CKTOTAL, CKMB, CKMBINDEX, TROPONINI in the last 168 hours. BNP (last 3 results) No results for input(s): PROBNP in the last 8760 hours. HbA1C: No results for  input(s): HGBA1C in the last 72 hours. CBG: Recent Labs  Lab 01/14/20 2018 01/14/20 2354 01/15/20 0407 01/15/20 0730 01/15/20 1124    GLUCAP 116* 99 121* 75 77   Lipid Profile: No results for input(s): CHOL, HDL, LDLCALC, TRIG, CHOLHDL, LDLDIRECT in the last 72 hours. Thyroid Function Tests: No results for input(s): TSH, T4TOTAL, FREET4, T3FREE, THYROIDAB in the last 72 hours. Anemia Panel: Recent Labs    01/14/20 1115  VITAMINB12 288  FERRITIN 25  TIBC 274  IRON 96      Radiology Studies: I have reviewed all of the imaging during this hospital visit personally     Scheduled Meds: . free water  375 mL Per Tube Q8H  . terazosin  2 mg Oral QHS  . traZODone  50 mg Oral QHS  . TwoCal HN  1,185 mL Per Tube Q24H   Continuous Infusions:   LOS: 1 day        Tiye Huwe Gerome Apley, MD

## 2020-01-15 NOTE — Progress Notes (Signed)
Patient transfer to [id number]. Report given to receiving RN with update information on Patient. Patient alert and oriented, vitals stable, ostomy drainage bag changed prior to transfer. Patient aware of transfer to another unit. Patient informed mother of transfer and she's made aware. Patient's belonging at bedside and accounted for and patient stated all belongings are present. Conley Rolls rn

## 2020-01-15 NOTE — Consult Note (Signed)
[facility] Nurse ostomy follow up Patient receiving care in [place] 3C10.  Primary RN present for pouch assessment. Stoma type/location: R upper chest area Stomal assessment/size: deferred, supplies have to be ordered Peristomal assessment: deferred, patient does all of fistula self care and pouching Treatment options for stomal/peristomal skin: barrier ring Output secretions from esophagus Ostomy pouching: 1pc./2pc. Both systems for urinary pouching Kellie Simmering numbers provided Thank you for the consult.  Discussed plan of care with the patient and bedside nurse.  [place] nurse will not follow at this time.  Please re-consult the [place] team if needed.  Val Riles, RN, MSN, CWOCN, CNS-BC, pager [phone]

## 2020-01-15 NOTE — Progress Notes (Signed)
Thomas [name] is a 49 y.o. male patient transfer from [address] [place].   Awake, alert - oriented  X 4 - no acute distress noted.  VSS - Blood pressure 99/62, pulse 72, temperature 98.5 F (36.9 C), temperature source Oral, resp. rate 19, weight 45.1 kg, SpO2 98 %.    IV in place, occlusive dsg intact without redness.  Orientation to room, and floor completed.  Admission INP armband ID verified with patient/family, and in place.   SR up x 2, fall assessment complete, with patient and family able to verbalize understanding of risk associated with falls, and verbalized understanding to call nsg before up out of bed.  Call light within reach, patient able to voice, and demonstrate understanding.        Will cont to eval and treat per MD orders.  Theora Master, RN 01/15/2020 4:55 PM

## 2020-01-16 LAB — BASIC METABOLIC PANEL
Anion gap: 10 (ref 5–15)
BUN: 27 mg/dL — ABNORMAL HIGH (ref 6–20)
CO2: 32 mmol/L (ref 22–32)
Calcium: 9.6 mg/dL (ref 8.9–10.3)
Chloride: 110 mmol/L (ref 98–111)
Creatinine, Ser: 0.74 mg/dL (ref 0.61–1.24)
GFR calc Af Amer: >60 mL/min (ref >60)
GFR calc non Af Amer: >60 mL/min (ref >60)
Glucose, Bld: 91 mg/dL (ref 70–99)
Potassium: 4.3 mmol/L (ref 3.5–5.1)
Sodium: 152 mmol/L — ABNORMAL HIGH (ref 135–145)

## 2020-01-16 LAB — GLUCOSE, CAPILLARY
Glucose-Capillary: 113 mg/dL — ABNORMAL HIGH (ref 70–99)
Glucose-Capillary: 99 mg/dL (ref 70–99)
Glucose-Capillary: 67 mg/dL — ABNORMAL LOW (ref 70–99)
Glucose-Capillary: 95 mg/dL (ref 70–99)

## 2020-01-16 MED ORDER — ARTIFICIAL TEARS OPHTHALMIC OINT
TOPICAL_OINTMENT | Freq: Three times a day (TID) | OPHTHALMIC | Status: DC
  Filled 2020-01-16: qty 3.5

## 2020-01-16 MED ORDER — ZINC OXIDE 12.8 % EX OINT
TOPICAL_OINTMENT | Freq: Three times a day (TID) | CUTANEOUS | Status: DC
  Filled 2020-01-16: qty 56.7

## 2020-01-16 MED ORDER — FREE WATER
400.0000 mL | Freq: Four times a day (QID) | Status: DC
  Administered 2020-01-16 – 2020-01-18 (×9): 400 mL

## 2020-01-16 MED ORDER — OXYCODONE HCL 5 MG PO TABS
10.0000 mg | ORAL_TABLET | ORAL | Status: DC | PRN
  Administered 2020-01-16 – 2020-01-18 (×11): 10 mg via ORAL
  Filled 2020-01-16 (×12): qty 2

## 2020-01-16 MED ORDER — JEVITY 1.2 CAL PO LIQD
1000.0000 mL | ORAL | Status: DC
  Administered 2020-01-16 – 2020-01-18 (×2): 1000 mL
  Filled 2020-01-16 (×5): qty 1000

## 2020-01-16 NOTE — Progress Notes (Signed)
PROGRESS NOTE    Thomas [name]  [id number] DOB: 09-Jul-1971 DOA: 01/13/2020 PCP: Sinda Du, MD    Brief Narrative:   Patient was admitted to the hospital with the working diagnosis of recurrent hypernatremia.  49 year old male who presented after loosing G tube.  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 History of multiple surgeries recent partial esophageal resection and creation of spit fistula in 07/20. High output GJ tube.  Hospitalization 1/11-1/15 at [facility] and 1/23-1/29 at [facility] for hypernatremia.  On his initial physical examination his temperature was 100.3 F, pulse rate 95-123, blood pressure 89/63.  Lungs clear to auscultation bilaterally, heart S1-S2 present rhythmic, soft abdomen, GJ tube in place, no lower extremity edema. Sodium 164, potassium 4.1, chloride 115, bicarb 39, glucose 106, BUN 55, creatinine 1.0  Patient has been placed on free water flushed per G tube with improvement of Hypernatreamia, but not normalization yet.   Assessment & Plan:   Principal Problem:   Hypernatremia Active Problems:   Low grade fever   Esophageal atresia   Gastrostomy tube in place ([place])    1. Hypernatremia/ hypovolemic. Na this am at 152, clinically patient continue to be hypovolemic. Suspected increased fluid loss and insensible losses through stoma at the spti fistula. Will increase free water flushes to 400 ml tid. Consult nutrition to adjust tube feedings.   2. Esophageal atresia. Colonic interposition and spit fistula. Increase fluid loss, with skin irritation and pain. Will consult wound care team for further assistance, add topical zinc oxide. Continue pain control with enteral analgesics.   3. Depression. Patient with depression, psych consulted, recommendations to add gabapentin or trileptal. Considering patient's persistent pain will add gabapentin 100 mg po bid. Will need outpatient psych  evaluation.   4. Acute on chronic pain syndrome. Continue with as needed acetaminophen, and ibuprofen. Patient with persistent pain despite tramadol, will change back to oxycodone.   5. Chronic anemia. hgb and hct have been stable at 11.0 and 38.0  6. BPH. Continue with terazosin. Urine analysis with no signs of infection.   7. Calorie protein malnutrition (unspecified severity) with hypoglycemia. Patient with episodic hypoglycemia, clinically with malnutrition, calorie protein. Will consult nutrition for further evaluation and management of tube feedings.    DVT prophylaxis: enoxaparin   Code Status: full Family Communication: I spoke with patient's mother at the bedside, we talked in detail about patient's condition, plan of care and prognosis and all questions were addressed.   Disposition Plan/ discharge barriers: Pending improvement of serum Na at the 140 range.    Subjective: Patient has skin irritation at the site of stomas, he lost his upper chest stoma collecting bag. Continue to have pain, shoulder and back. No nausea or vomiting. Persistent dry eyes.   Objective: Vitals:   01/15/20 1128 01/15/20 1251 01/15/20 1956 01/16/20 0415  BP: (!) 107/59 99/62 (!) 104/55 (!) 114/52  Pulse: 74 72 69 62  Resp: 18 19 16 16   Temp: 98.3 F (36.8 C) 98.5 F (36.9 C)  97.9 F (36.6 C)  TempSrc: Oral Oral  Oral  SpO2: 99% 98% 100% 100%  Weight:        Intake/Output Summary (Last 24 hours) at 01/16/2020 1331 Last data filed at 01/16/2020 0740 Gross per 24 hour  Intake 1185 ml  Output 4645 ml  Net -3460 ml   Filed Weights   01/13/20 1430 01/14/20 0700 01/15/20 0641  Weight: 43.8 kg 44.9 kg 45.1 kg  Examination:   General: Not in pain or dyspnea, deconditioned  Neurology: Awake and alert, non focal  E ENT: positive pallor, no icterus, oral mucosa moist Cardiovascular: No JVD. S1-S2 present, rhythmic, no gallops, rubs, or murmurs. No lower extremity edema. Pulmonary:  positive breath sounds bilaterally, adequate air movement, no wheezing, rhonchi or rales. Gastrointestinal. Abdomen with no organomegaly, non tender, no rebound or guarding/ G tube in place.  Skin. No rashes Musculoskeletal: no joint deformities     Data Reviewed: I have personally reviewed following labs and imaging studies  CBC: Recent Labs  Lab 01/13/20 1252 01/13/20 1255 01/14/20 0616 01/14/20 1835 01/15/20 0621  WBC 10.0  --  9.5 7.8 7.0  NEUTROABS 7.6  --   --   --   --   HGB 14.6 15.6 10.7* 11.0* 11.0*  HCT 51.7 46.0 35.9* 37.1* 38.0*  MCV 95.9  --  92.5 92.3 93.4  PLT 153  --  124* 113* [id number]*   Basic Metabolic Panel: Recent Labs  Lab 01/13/20 1623 01/13/20 1623 01/13/20 2006 01/13/20 2006 01/14/20 0616 01/14/20 1115 01/14/20 1835 01/14/20 2225 01/15/20 0621 01/15/20 1041 01/16/20 0720  NA 161*   < > 156*   < > 146*   < > 147* 149* 151* 153* 152*  K 4.1  --  3.7  --  3.1*  --   --   --  3.9  --  4.3  CL 115*  --  108  --  103  --   --   --  110  --  110  CO2 36*  --  35*  --  34*  --   --   --  34*  --  32  GLUCOSE 110*  --  103*  --  119*  --   --   --  95  --  91  BUN 51*  --  49*  --  42*  --   --   --  26*  --  27*  CREATININE 0.96  --  1.04  --  0.95  --   --   --  0.75  --  0.74  CALCIUM 10.6*  --  10.1  --  9.1  --   --   --  9.7  --  9.6  MG  --   --   --   --  2.3  --   --   --   --   --   --    < > = values in this interval not displayed.   GFR: Estimated Creatinine Clearance: 72 mL/min (by C-G formula based on SCr of 0.74 mg/dL). Liver Function Tests: Recent Labs  Lab 01/13/20 1252  AST 20  ALT 27  ALKPHOS 71  BILITOT 0.3  PROT 7.1  ALBUMIN 4.0   No results for input(s): LIPASE, AMYLASE in the last 168 hours. No results for input(s): AMMONIA in the last 168 hours. Coagulation Profile: Recent Labs  Lab 01/13/20 1252  INR 1.0   Cardiac Enzymes: No results for input(s): CKTOTAL, CKMB, CKMBINDEX, TROPONINI in the last 168  hours. BNP (last 3 results) No results for input(s): PROBNP in the last 8760 hours. HbA1C: No results for input(s): HGBA1C in the last 72 hours. CBG: Recent Labs  Lab 01/15/20 2006 01/15/20 2322 01/16/20 0417 01/16/20 0731 01/16/20 1145  GLUCAP 88 77 113* 99 67*   Lipid Profile: No results for input(s): CHOL, HDL, LDLCALC, TRIG, CHOLHDL, LDLDIRECT in the last 72 hours.  Thyroid Function Tests: No results for input(s): TSH, T4TOTAL, FREET4, T3FREE, THYROIDAB in the last 72 hours. Anemia Panel: Recent Labs    01/14/20 1115  VITAMINB12 288  FERRITIN 25  TIBC 274  IRON 96      Radiology Studies: I have reviewed all of the imaging during this hospital visit personally     Scheduled Meds: . acetaminophen  500 mg Per Tube Q6H  . artificial tears   Both Eyes Q8H  . free water  400 mL Per Tube Q6H  . terazosin  2 mg Per Tube QHS  . traZODone  50 mg Per Tube QHS  . TwoCal HN  1,185 mL Per Tube Q24H  . Zinc Oxide   Topical TID   Continuous Infusions:   LOS: 2 days        Cinsere Mizrahi Gerome Apley, MD

## 2020-01-16 NOTE — TOC Initial Note (Signed)
Transition of Care [facility]) - Initial/Assessment Note    Patient Details  Name: Thomas [name] MRN: [id number] Date of Birth: Nov 07, 1971  [facility]) CM/SW Contact:    Marilu Favre, RN Phone Number: 01/16/2020, 12:19 PM  Clinical Narrative:                 Confirmed face sheet information. Patient from home with girl friend.   Active with [facility] for RN and PT.   Checking to see where patient gets tub feeds  Expected Discharge Plan: [place] Barriers to Discharge: Continued Medical Work up   Patient Goals and CMS Choice Patient states their goals for this hospitalization and ongoing recovery are:: to return to home CMS Medicare.gov Compare Post Acute Care list provided to:: Patient Choice offered to / list presented to : Patient  Expected Discharge Plan and Services Expected Discharge Plan: [place] In-house Referral: Nutrition Discharge Planning Services: CM Consult Post Acute Care Choice: [place] arrangements for the past 2 months: Single Family Home                 DME Arranged: N/A         HH Arranged: RN, PT [place] Agency: [place] ([facility]) Date HH Agency Contacted: 01/16/20 Time [place]: 1219 Representative spoke with at [place]: [place] Arrangements/Services Living arrangements for the past 2 months: [place] with:: Significant Other(girl friend) Patient language and need for interpreter reviewed:: Yes Do you feel safe going back to the place where you live?: Yes      Need for Family Participation in Patient Care: Yes (Comment) Care giver support system in place?: Yes (comment) Current home services: Home PT, Home RN Criminal Activity/Legal Involvement Pertinent to Current Situation/Hospitalization: No - Comment as needed  Activities of Daily Living      Permission Sought/Granted   Permission granted to share information  with : No              Emotional Assessment   Attitude/Demeanor/Rapport: Engaged Affect (typically observed): Accepting Orientation: : Oriented to Self, Oriented to Place, Oriented to  Time, Oriented to Situation Alcohol / Substance Use: Not Applicable    Admission diagnosis:  Hypernatremia [E87.0] Patient Active Problem List   Diagnosis Date Noted  . Low grade fever 01/13/2020  . 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 Gastrostomy tube in place ([place]) 01/13/2020  . Protein-calorie malnutrition, severe 12/20/2019  . Hypernatremia 12/18/2019  . Right-sided chest pain 01/31/2019  . Anastomotic ulcer 01/31/2019   PCP:  Sinda Du, MD Pharmacy:   [place], [place] [address] [place] [address] Phone: [phone] Fax: [phone]     Social Determinants of Health (SDOH) Interventions    Readmission Risk Interventions No flowsheet data found.

## 2020-01-16 NOTE — Progress Notes (Addendum)
Initial Nutrition Assessment  RD working remotely.  DOCUMENTATION CODES:   Underweight  INTERVENTION:   -D/c TwoCal HN  -Initiate Jevity 1.2 @ 65 ml/hr via g-tube   Free water flushes per MD: 400 ml free water flushes every 6 hours  Tube feeding regimen provides 1872 kcal (100% of needs), 87 grams of protein, and 1259 ml of H2O. Total free water: 2859 ml   -Continue clear liquid diet for comfort  NUTRITION DIAGNOSIS:   Inadequate oral intake related to inability to eat as evidenced by NPO status.  GOAL:   Patient will meet greater than or equal to 90% of their needs  MONITOR:   Labs, Weight trends, TF tolerance, Skin, I & O's  REASON FOR ASSESSMENT:   Consult Enteral/tube feeding initiation and management  ASSESSMENT:   Thomas [name] is a 49 y.o. male with medical history significant of esophageal atresia s/p retrosternal colonic interposition with history of multiple surgeries with recent partial esophageal resection and creation of spit fistula in 06/2019 and high output GJ tube.  Patient presents with complaints of out of it.  Records note patient was admitted at [facility] from 1/11-1/15 and then at at [facility] from 1/23-1/29 for hypernatremia.  After discharge home patient was supposed to have been arranged home health, but he states they have not  come out because he keeps coming back into the hospital.  Patient notes that he had not been giving himself free water as they had previously instructed him.  He complains of feeling anxious and notes that his ostomy needs to be changes skin leaking.  Pt admitted with hypernatremia secondary to dehydration.   Reviewed I/O's: -6 L x 24 hours and -17.7 L since admission  UOP: 1.2 L x 24 hours  Per CWOCN notes, area around esophageal cutaneous fistula crusted with stoma powder; eakin ring and 2 piece urinary pouch placed and connected to drainage bag.   Reviewed wt hx; noted pt has experienced a 29.5% wt loss over  the past year, which is significant for time frame. Highly suspect pt with malnutrition, however, RD unable to identify at this time.   Reviewed RD notes from previous hospitalizations. Noted pt has admitted to [place] last month secondary to severe hypernatremia. PTA pt has not been administering free water flushes as prescribed. Per previous RD notes, pt with historically poor tolerance of concentrated formulas (TwoCal HN and Osmolite 1.5). Per previous RD notes, recommending home formula of Jevity 1.2 @ 65 ml/hr.   Pt currently receiving nocturnal feedings of TwoCal Hn @ 100 ml/hr every 12 hours. Regimen providing 2400 kcals, 100 grams protein, and 840 ml free water, which meets >100% of estimated kcal needs and 100% of estimated protein needs.   Per MD note today, increasing free water flushes to 400 ml TID and suspect increased fluid losses through stoma. Pt consumes liquids for taste throughout the day, however, these are drained through his stoma. Pt also with some instances of hypoglycemia; will transition to continuous feedings to minimize risk of hypoglycemia.   Therapies recommending home health services.    Labs reviewed: Na: 152, CBGS: 67-113 (inpatient orders for glycemic control are none).   Diet Order:   Diet Order            Diet clear liquid Room service appropriate? Yes; Fluid consistency: Thin  Diet effective now              EDUCATION NEEDS:   No education  needs have been identified at this time  Skin:  Skin Assessment: Skin Integrity Issues: Skin Integrity Issues:: Other (Comment) Other: stoma rt side of throat  Last BM:  Unknown  Height:   Ht Readings from Last 1 Encounters:  12/18/19 5\' 6"  (1.676 m)    Weight:   Wt Readings from Last 1 Encounters:  01/15/20 45.1 kg    Ideal Body Weight:  64.5 kg  BMI:  Body mass index is 16.05 kg/m.  Estimated Nutritional Needs:   Kcal:  1800-2000  Protein:  90-105 grams  Fluid:  > 1.8  L    [name], RD, LDN, [place] Registered Dietitian II Certified Diabetes Care and Education Specialist Please refer to [facility] for RD and/or RD on-call/weekend/after hours pager

## 2020-01-16 NOTE — Consult Note (Signed)
WOC Nurse Consult Note: Area around esophageal cutaneous fistula crusted with stoma powder and no sting prep. Eakin ring used, then 2 piece urinary pouch placed and connected to bedside drainage bag. Val Riles, RN, MSN, CWOCN, CNS-BC, pager [phone]

## 2020-01-16 NOTE — Evaluation (Signed)
Physical Therapy Evaluation Patient Details Name: Thomas [name] MRN: [id number] DOB: Feb 09, 1971 Today's Date: 01/16/2020   History of Present Illness  HPI: Thomas [name] is a 49 y.o. male with medical history significant of esophageal atresia s/p retrosternal colonic interposition with history of multiple surgeries with recent partial esophageal resection and creation of spit fistula in 06/2019 and high output GJ tube.  Patient presents with complaints of out of it.  Records note patient was admitted at [facility] from 1/11-1/15 and then at at [facility] from 1/23-1/29 for hypernatremia.     Clinical Impression  Pt admitted with above. Pt reports he needs help with ADLs and doesn't move around well. Unsure how well patient has been caring for himself at home as he doesn't have a car to get groceries or necessities. Pt has been in/out of hospital several time since 12/2019. Pt functioning in room with min guard but states he can't dress himself. Acute PT to con't to follow to progress mobility.     Follow Up Recommendations Home health PT;Supervision - Intermittent(pt reports unable to do tasks in house)    Equipment Recommendations  None recommended by PT    Recommendations for Other Services       Precautions / Restrictions Precautions Precautions: Fall Precaution Comments: leaking stoma, g-tube Restrictions Weight Bearing Restrictions: No      Mobility  Bed Mobility Overal bed mobility: Needs Assistance Bed Mobility: Supine to Sit     Supine to sit: Min assist     General bed mobility comments: min A for line management  Transfers Overall transfer level: Needs assistance Equipment used: None Transfers: Sit to/from Stand Sit to Stand: Min guard         General transfer comment: pt deferred use of RW or HHA  Ambulation/Gait Ambulation/Gait assistance: Min guard Gait Distance (Feet): 5 Feet(to chair) Assistive device: None Gait Pattern/deviations: Step-through  pattern;Trunk flexed     General Gait Details: pt refused to ambulate any further because of stoma leaking, no episode of LOB  Stairs            Wheelchair Mobility    Modified Rankin (Stroke Patients Only)       Balance Overall balance assessment: Needs assistance Sitting-balance support: Feet supported;No upper extremity supported Sitting balance-Leahy Scale: Good     Standing balance support: No upper extremity supported;During functional activity Standing balance-Leahy Scale: Fair                               Pertinent Vitals/Pain Pain Assessment: No/denies pain    Home Living Family/patient expects to be discharged to:: Private residence Living Arrangements: Spouse/significant other Available Help at Discharge: Family;Available PRN/intermittently Type of Home: House Home Access: Ramped entrance     Home Layout: Two level;Able to live on main level with bedroom/bathroom(only uses upstairs for storage) Home Equipment: Walker - 2 wheels;Shower seat      Prior Function Level of Independence: Needs assistance   Gait / Transfers Assistance Needed: reports using cane or RW depends on whatever is closer  ADL's / Homemaking Assistance Needed: reports needing help with bathing but doesn't have anyone to help him so he does it himself, mother does grocery shopping  Comments: reports his girlfriend isn't well and can't help herself let alone him, she goes to HD on MWF.     Hand Dominance   Dominant Hand: Right    Extremity/Trunk Assessment  Upper Extremity Assessment Upper Extremity Assessment: Overall WFL for tasks assessed    Lower Extremity Assessment Lower Extremity Assessment: Generalized weakness    Cervical / Trunk Assessment Cervical / Trunk Assessment: Other exceptions Cervical / Trunk Exceptions: R upper chest stoma, g-tube, multiple scars t/o abdomen and R shoulder  Communication   Communication: No difficulties  Cognition  Arousal/Alertness: Awake/alert Behavior During Therapy: Flat affect Overall Cognitive Status: Within Functional Limits for tasks assessed                                 General Comments: pt easiliy irritated and perseverating on getting his stoma dressing changed as its leaking      General Comments General comments (skin integrity, edema, etc.): pt with stoma in upper R chest leaking, pt with saturated dressing around g-tube. when OT asked pt to put socks on pt able to bring bilat Knees to chest but report he can't put his socks on    Exercises     Assessment/Plan    PT Assessment Patient needs continued PT services  PT Problem List Decreased strength;Decreased range of motion;Decreased activity tolerance;Decreased balance;Decreased mobility;Decreased knowledge of use of DME       PT Treatment Interventions DME instruction;Gait training;Stair training;Functional mobility training;Therapeutic activities;Therapeutic exercise;Balance training;Neuromuscular re-education    PT Goals (Current goals can be found in the Care Plan section)  Acute Rehab PT Goals Patient Stated Goal: get better PT Goal Formulation: With patient Time For Goal Achievement: 01/30/20 Potential to Achieve Goals: Good    Frequency Min 3X/week   Barriers to discharge Decreased caregiver support no help at home    Co-evaluation PT/OT/SLP Co-Evaluation/Treatment: Yes Reason for Co-Treatment: Complexity of the patient's impairments (multi-system involvement) PT goals addressed during session: Mobility/safety with mobility         AM-PAC PT "6 Clicks" Mobility  Outcome Measure Help needed turning from your back to your side while in a flat bed without using bedrails?: None Help needed moving from lying on your back to sitting on the side of a flat bed without using bedrails?: None Help needed moving to and from a bed to a chair (including a wheelchair)?: A Little Help needed standing up  from a chair using your arms (e.g., wheelchair or bedside chair)?: A Little Help needed to walk in hospital room?: A Little Help needed climbing 3-5 steps with a railing? : A Little 6 Click Score: 20    End of Session   Activity Tolerance: Other (comment)(limited by leaking stoma) Patient left: in chair;with call bell/phone within reach;with chair alarm set Nurse Communication: Mobility status PT Visit Diagnosis: Unsteadiness on feet (R26.81);Muscle weakness (generalized) (M62.81);Difficulty in walking, not elsewhere classified (R26.2)    Time: [id number] PT Time Calculation (min) (ACUTE ONLY): 29 min   Charges:   PT Evaluation $PT Eval Moderate Complexity: 1 Mod          Kittie Plater, PT, DPT Acute Rehabilitation Services Pager #: [phone] Office #: [phone]   Berline Lopes 01/16/2020, 11:41 AM
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
## 2020-01-16 NOTE — Evaluation (Signed)
Occupational Therapy Evaluation Patient Details Name: Thomas [name] MRN: [id number] DOB: 1971/01/22 Today's Date: 01/16/2020    History of Present Illness HPI: Thomas [name] is a 49 y.o. male with medical history significant of esophageal atresia s/p retrosternal colonic interposition with history of multiple surgeries with recent partial esophageal resection and creation of spit fistula in 06/2019 and high output GJ tube.  Patient presents with complaints of out of it.  Records note patient was admitted at [facility] from 1/11-1/15 and then at at [facility] from 1/23-1/29 for hypernatremia.    Clinical Impression   Patient presenting with decreased I in self care, balance, functional mobility/transfers, endurance, and strength. Patient reports being mod I PTA but this therapist questions how well he was taking care of himself at home. Pt reports significant other is on dialysis and cannot do for herself and has an aide that assist her with self care and does IADL tasks for them at home.  Patient currently functioning at min guard - min A overall with functional transfers and mobility without use of AD. Pt verbalizing he could not dressing himself. He cleans himself with washcloth with set up A from bed level secondary to soma/dressing leaking. Pt brings knees to chest and then reports unable to don socks himself.  Patient will benefit from acute OT to increase overall independence in the areas of ADLs, functional mobility,and safety awareness in order to safely discharge home.    Follow Up Recommendations  Home health OT;Supervision/Assistance - 24 hour    Equipment Recommendations  None recommended by OT       Precautions / Restrictions Precautions Precautions: Fall Precaution Comments: leaking stoma, g-tube      Mobility Bed Mobility Overal bed mobility: Needs Assistance Bed Mobility: Supine to Sit     Supine to sit: Min assist     General bed mobility comments: min A for line  management  Transfers Overall transfer level: Needs assistance Equipment used: None Transfers: Sit to/from Stand Sit to Stand: Min guard         General transfer comment: pt deferred use of RW or HHA    Balance Overall balance assessment: Needs assistance   Sitting balance-Leahy Scale: Good     Standing balance support: No upper extremity supported;During functional activity Standing balance-Leahy Scale: Fair          ADL either performed or assessed with clinical judgement   ADL Overall ADL's : Needs assistance/impaired       General ADL Comments: Pt appears to be self limiting. Pt verbalizing he is unable to dress himself but pts himself into positions to complete task and then hands to therapist and verbalizes he can't.     Vision Baseline Vision/History: No visual deficits Patient Visual Report: No change from baseline              Pertinent Vitals/Pain Pain Assessment: No/denies pain     Hand Dominance Right   Extremity/Trunk Assessment Upper Extremity Assessment Upper Extremity Assessment: Overall WFL for tasks assessed   Lower Extremity Assessment Lower Extremity Assessment: Generalized weakness   Cervical / Trunk Assessment Cervical / Trunk Assessment: Other exceptions Cervical / Trunk Exceptions: R upper chest stoma, g-tube, multiple scars t/o abdomen and R shoulder   Communication Communication Communication: No difficulties   Cognition Arousal/Alertness: Awake/alert Behavior During Therapy: Flat affect Overall Cognitive Status: Within Functional Limits for tasks assessed         General Comments: pt easiliy irritated and perseverating  on getting his stoma dressing changed as its leaking              Home Living Family/patient expects to be discharged to:: Private residence Living Arrangements: Spouse/significant other Available Help at Discharge: Family;Available PRN/intermittently Type of Home: House Home Access: Ramped  entrance     Home Layout: Two level;Able to live on main level with bedroom/bathroom Alternate Level Stairs-Number of Steps: Patient does not go upstairs   Bathroom Shower/Tub: Teacher, early years/pre: Handicapped height Bathroom Accessibility: Yes   Home Equipment: Environmental consultant - 2 wheels;Shower seat          Prior Functioning/Environment Level of Independence: Needs assistance  Gait / Transfers Assistance Needed: reports using cane or RW depends on whatever is closer ADL's / Homemaking Assistance Needed: reports needing help with bathing but doesn't have anyone to help him so he does it himself, mother does grocery shopping   Comments: reports his girlfriend isn't well and can't help herself let alone him, she goes to HD on MWF.        OT Problem List: Decreased strength;Decreased activity tolerance;Decreased safety awareness;Impaired balance (sitting and/or standing)      OT Treatment/Interventions: Self-care/ADL training;Therapeutic exercise;Therapeutic activities;Neuromuscular education;Energy conservation;DME and/or AE instruction;Patient/family education;Balance training    OT Goals(Current goals can be found in the care plan section) Acute Rehab OT Goals Patient Stated Goal: get better OT Goal Formulation: With patient Time For Goal Achievement: 01/30/20 Potential to Achieve Goals: Good ADL Goals Pt Will Perform Grooming: with modified independence Pt Will Perform Upper Body Bathing: with modified independence Pt Will Perform Lower Body Bathing: with modified independence Pt Will Perform Upper Body Dressing: with modified independence Pt Will Perform Lower Body Dressing: with modified independence Pt Will Transfer to Toilet: with modified independence Pt Will Perform Toileting - Clothing Manipulation and hygiene: with modified independence Pt Will Perform Tub/Shower Transfer: with supervision  OT Frequency: Min 2X/week   Barriers to D/C: Decreased caregiver  support          Co-evaluation PT/OT/SLP Co-Evaluation/Treatment: Yes Reason for Co-Treatment: Complexity of the patient's impairments (multi-system involvement)   OT goals addressed during session: ADL's and self-care      AM-PAC OT "6 Clicks" Daily Activity     Outcome Measure Help from another person eating meals?: None Help from another person taking care of personal grooming?: A Little Help from another person toileting, which includes using toliet, bedpan, or urinal?: A Little Help from another person bathing (including washing, rinsing, drying)?: A Little Help from another person to put on and taking off regular upper body clothing?: None Help from another person to put on and taking off regular lower body clothing?: A Little 6 Click Score: 20   End of Session Nurse Communication: Mobility status;Precautions  Activity Tolerance: Patient tolerated treatment well Patient left: in chair;with call bell/phone within reach;with chair alarm set  OT Visit Diagnosis: Muscle weakness (generalized) (M62.81)                Time: [id number] OT Time Calculation (min): 24 min Charges:  OT General Charges $OT Visit: 1 Visit OT Evaluation $OT Eval Low Complexity: 1 Low  Aksh Swart P MS, OTR/L 01/16/2020, 3:36 PM

## 2020-01-17 ENCOUNTER — Encounter (HOSPITAL_COMMUNITY): Payer: Self-pay | Admitting: Internal Medicine

## 2020-01-17 LAB — BASIC METABOLIC PANEL
Anion gap: 11 (ref 5–15)
BUN: 27 mg/dL — ABNORMAL HIGH (ref 6–20)
CO2: 33 mmol/L — ABNORMAL HIGH (ref 22–32)
Calcium: 10.1 mg/dL (ref 8.9–10.3)
Chloride: 108 mmol/L (ref 98–111)
Creatinine, Ser: 0.82 mg/dL (ref 0.61–1.24)
GFR calc Af Amer: >60 mL/min (ref >60)
GFR calc non Af Amer: >60 mL/min (ref >60)
Glucose, Bld: 106 mg/dL — ABNORMAL HIGH (ref 70–99)
Potassium: 3.8 mmol/L (ref 3.5–5.1)
Sodium: 152 mmol/L — ABNORMAL HIGH (ref 135–145)

## 2020-01-17 LAB — GLUCOSE, CAPILLARY
Glucose-Capillary: 101 mg/dL — ABNORMAL HIGH (ref 70–99)
Glucose-Capillary: 103 mg/dL — ABNORMAL HIGH (ref 70–99)
Glucose-Capillary: 111 mg/dL — ABNORMAL HIGH (ref 70–99)
Glucose-Capillary: 123 mg/dL — ABNORMAL HIGH (ref 70–99)
Glucose-Capillary: 60 mg/dL — ABNORMAL LOW (ref 70–99)
Glucose-Capillary: 71 mg/dL (ref 70–99)
Glucose-Capillary: 85 mg/dL (ref 70–99)

## 2020-01-17 MED ORDER — BOOST / RESOURCE BREEZE PO LIQD CUSTOM: 1.0000 | Freq: Three times a day (TID) | ORAL | Status: DC

## 2020-01-17 MED ORDER — ENSURE ENLIVE PO LIQD: 237.0000 mL | Freq: Two times a day (BID) | ORAL | Status: DC

## 2020-01-17 MED ORDER — DEXTROSE 50 % IV SOLN
12.5000 g | Freq: Once | INTRAVENOUS | Status: AC
  Administered 2020-01-17: 12.5 g via INTRAVENOUS
  Filled 2020-01-17: qty 50

## 2020-01-17 MED ORDER — GABAPENTIN 100 MG PO CAPS
100.0000 mg | ORAL_CAPSULE | Freq: Two times a day (BID) | ORAL | Status: DC
  Administered 2020-01-17 – 2020-01-18 (×3): 100 mg via ORAL
  Filled 2020-01-17 (×3): qty 1

## 2020-01-17 NOTE — Progress Notes (Signed)
PROGRESS NOTE   Thomas [name]  [id number]    DOB: 01-Jun-1971    DOA: 01/13/2020  PCP: Sinda Du, MD   I have briefly reviewed patients previous medical records in [facility].  Chief Complaint:   Chief Complaint  Patient presents with  . Anxiety    Brief Narrative:  49 year old male with PMH of esophageal atresia s/p retrosternal colonic interposition with history of multiple surgeries with recent partial esophageal resection and creation of split fistula in 06/2019 and high output GJ tube, recent hospitalization at [facility] 1/11-1/15 and then at [facility] from 1/23-1/29 for hyponatremia, since discharge reportedly has not had assistance from home health services, presented to ED reporting "feel out of it".  Admitted for hyponatremia secondary to GI losses and poor intake.  Slowly improving.   Assessment & Plan:  Principal Problem:   Hypernatremia Active Problems:   Low grade fever   Esophageal atresia   Gastrostomy tube in place [facility])   Hypovolemic hypernatremia: Likely secondary to GI losses and poor intake.  Recurrent issue.  Sodium 164 on admission.  This has gradually improved to 152 today.  Encouraged increase free water intake through the PEG tube.  Follow BMP in a.m.  No current mental status changes.  Esophageal atresia: S/p colonic interposition and split fistula.  Increased fluid losses around the fistula with skin irritation.  Wound care input appreciated.  Depression: Telemetry psych evaluated and recommended adding gabapentin or Trileptal.  Gabapentin initiated.  Will need outpatient evaluation.  Acute on chronic pain syndrome: Continue current pain regimen.  Patient reports that he has had appointments with [facility] management but unable to keep it because he keeps bouncing back to the hospital each time.  Judicious use of opioids.  Anemia of chronic disease: Stable.  Thrombocytopenia: Stable.  Follow CBC in a.m.  BPH:  Continue Terazosin.  Protein calorie malnutrition with hypoglycemia: Continue tube feeds.  No further hypoglycemic episodes today.  Body mass index is 16.05 kg/m.  Nutritional Status Nutrition Problem: Inadequate oral intake Etiology: inability to eat Signs/Symptoms: NPO status Interventions: Tube feeding  DVT prophylaxis: Enoxaparin Code Status: Full Family Communication: None at bedside Disposition:  . Patient came from: Home           . Anticipated d/c place: Home . Barriers to d/c: Pending clinical improvement including normalization of his hypernatremia, pain control and tolerating PEG feeds   Consultants:   None  Procedures:   None  Antimicrobials:   None   Subjective:  Reports ongoing pain in multiple places including his lower extremities, abdomen and site of chest fistula.  Asking if he can drink some carbonated drinks.  No fever, dyspnea or cough.  Objective:   Vitals:   01/16/20 1517 01/16/20 2017 01/17/20 0610 01/17/20 1403  BP: 122/65 112/66 127/67 109/67  Pulse: 70 72 64 75  Resp: 19 16 14 18   Temp: 97.7 F (36.5 C) 97.6 F (36.4 C) (!) 97.3 F (36.3 C) 99 F (37.2 C)  TempSrc: Oral Oral Oral Oral  SpO2: 100% 100% 100% 100%  Weight:        General exam: Young male, moderately built, frail and chronically ill looking lying comfortably propped up in bed. Respiratory system: Clear to auscultation. Respiratory effort normal. Cardiovascular system: S1 & S2 heard, RRR. No JVD, murmurs, rubs, gallops or clicks. No pedal edema. Gastrointestinal system: Abdomen is nondistended, soft and nontender. No organomegaly or masses felt. Normal bowel sounds heard.  PEG tube  without acute findings. Central nervous system: Alert and oriented. No focal neurological deficits. Extremities: Symmetric 5 x 5 power. Skin: No rashes, lesions or ulcers.  Has split fistula on his anterior chest with ostomy bag sealed appropriately.  Extensive chronic excoriated skin of  anterior chest/sternal Psychiatry: Judgement and insight appear normal. Mood & affect somewhat anxious.     Data Reviewed:   I have personally reviewed following labs and imaging studies   CBC: Recent Labs  Lab 01/13/20 1252 01/13/20 1255 01/14/20 0616 01/14/20 1835 01/15/20 0621  WBC 10.0   < > 9.5 7.8 7.0  NEUTROABS 7.6  --   --   --   --   HGB 14.6   < > 10.7* 11.0* 11.0*  HCT 51.7   < > 35.9* 37.1* 38.0*  MCV 95.9   < > 92.5 92.3 93.4  PLT 153   < > 124* 113* 114*   < > = values in this interval not displayed.    Basic Metabolic Panel: Recent Labs  Lab 01/14/20 0616 01/14/20 1115 01/15/20 0621 01/15/20 0621 01/15/20 1041 01/16/20 0720 01/17/20 0549  NA 146*   < > 151*   < > 153* 152* 152*  K 3.1*   < > 3.9  --   --  4.3 3.8  CL 103   < > 110  --   --  110 108  CO2 34*   < > 34*  --   --  32 33*  GLUCOSE 119*   < > 95  --   --  91 106*  BUN 42*   < > 26*  --   --  27* 27*  CREATININE 0.95   < > 0.75  --   --  0.74 0.82  CALCIUM 9.1   < > 9.7  --   --  9.6 10.1  MG 2.3  --   --   --   --   --   --    < > = values in this interval not displayed.    Liver Function Tests: Recent Labs  Lab 01/13/20 1252  AST 20  ALT 27  ALKPHOS 71  BILITOT 0.3  PROT 7.1  ALBUMIN 4.0    CBG: Recent Labs  Lab 01/17/20 0843 01/17/20 1237 01/17/20 1619  GLUCAP 123* 103* 85    Microbiology Studies:   Recent Results (from the past 240 hour(s))  Respiratory Panel by RT PCR (Flu A&B, Covid) - Nasopharyngeal Swab     Status: None   Collection Time: 01/13/20 12:38 PM   Specimen: Nasopharyngeal Swab  Result Value Ref Range Status   SARS Coronavirus 2 by RT PCR NEGATIVE NEGATIVE Final    Comment: (NOTE) SARS-CoV-2 target nucleic acids are NOT DETECTED. The SARS-CoV-2 RNA is generally detectable in upper respiratoy specimens during the acute phase of infection. The lowest concentration of SARS-CoV-2 viral copies this assay can detect is 131 copies/mL. A negative  result does not preclude SARS-Cov-2 infection and should not be used as the sole basis for treatment or other patient management decisions. A negative result may occur with  improper specimen collection/handling, submission of specimen other than nasopharyngeal swab, presence of viral mutation(s) within the areas targeted by this assay, and inadequate number of viral copies (<131 copies/mL). A negative result must be combined with clinical observations, patient history, and epidemiological information. The expected result is Negative. Fact Sheet for Patients:  PinkCheek.be Fact Sheet for Healthcare Providers:  GravelBags.it This test is not  yet ap proved or cleared by the [place] and  has been authorized for detection and/or diagnosis of SARS-CoV-2 by FDA under an Emergency Use Authorization (EUA). This EUA will remain  in effect (meaning this test can be used) for the duration of the COVID-19 declaration under Section 564(b)(1) of the Act, 21 U.S.C. section 360bbb-3(b)(1), unless the authorization is terminated or revoked sooner.    Influenza A by PCR NEGATIVE NEGATIVE Final   Influenza B by PCR NEGATIVE NEGATIVE Final    Comment: (NOTE) The Xpert Xpress SARS-CoV-2/FLU/RSV assay is intended as an aid in  the diagnosis of influenza from Nasopharyngeal swab specimens and  should not be used as a sole basis for treatment. Nasal washings and  aspirates are unacceptable for Xpert Xpress SARS-CoV-2/FLU/RSV  testing. Fact Sheet for Patients: PinkCheek.be Fact Sheet for Healthcare Providers: GravelBags.it This test is not yet approved or cleared by the [place] FDA and  has been authorized for detection and/or diagnosis of SARS-CoV-2 by  FDA under an Emergency Use Authorization (EUA). This EUA will remain  in effect (meaning this test can be used) for the  duration of the  Covid-19 declaration under Section 564(b)(1) of the Act, 21  U.S.C. section 360bbb-3(b)(1), unless the authorization is  terminated or revoked. Performed at [facility], [place] [address] [place], [place] [address]   Culture, blood (routine x 2)     Status: None (Preliminary result)   Collection Time: 01/13/20 12:50 PM   Specimen: BLOOD  Result Value Ref Range Status   Specimen Description BLOOD LEFT ANTECUBITAL  Final   Special Requests   Final    BOTTLES DRAWN AEROBIC AND ANAEROBIC Blood Culture adequate volume   Culture   Final    NO GROWTH 4 DAYS Performed at [facility], [place] [address], [place], [place] [address]    Report Status PENDING  Incomplete  Culture, blood (routine x 2)     Status: None (Preliminary result)   Collection Time: 01/13/20 12:55 PM   Specimen: BLOOD  Result Value Ref Range Status   Specimen Description BLOOD RIGHT ANTECUBITAL  Final   Special Requests   Final    BOTTLES DRAWN AEROBIC AND ANAEROBIC Blood Culture results may not be optimal due to an inadequate volume of blood received in culture bottles   Culture   Final    NO GROWTH 4 DAYS Performed at [facility], [place] [address] [place], [place] [address]    Report Status PENDING  Incomplete     Radiology Studies:  No results found.   Scheduled Meds:   . acetaminophen  500 mg Per Tube Q6H  . artificial tears   Both Eyes Q8H  . free water  400 mL Per Tube Q6H  . gabapentin  100 mg Oral BID  . terazosin  2 mg Per Tube QHS  . traZODone  50 mg Per Tube QHS  . Zinc Oxide   Topical TID    Continuous Infusions:   . feeding supplement (JEVITY 1.2 CAL) 1,000 mL (01/16/20 1751)     LOS: 3 days     Vernell Leep, MD, [place], [facility]. [facility]    To contact the attending provider between 7A-7P or the covering provider during after hours 7P-7A, please log into the web site [email or web address] and access using universal [place] password for that web  site. If you do not have the password, please call the hospital operator.  01/17/2020, 6:28 PM

## 2020-01-17 NOTE — TOC Progression Note (Signed)
Transition of Care [facility]) - Progression Note    Patient Details  Name: Thomas [name] MRN: [id number] Date of Birth: 10-09-1971  Transition of Care [facility]) CM/SW Contact  Jacalyn Lefevre Edson Snowball, RN Phone Number: 01/17/2020, 8:30 AM  Clinical Narrative:     Patient from home with girl friend. Arranged [facility] and PT with [place].  Patient gets tube feeding through [place]. Zack with [place] aware patient has been admitted.  Provided patient with  additional outpatient psych  Resources. Expected Discharge Plan: [place] Barriers to Discharge: Continued Medical Work up  Expected Discharge Plan and Services Expected Discharge Plan: [place] In-house Referral: Nutrition Discharge Planning Services: CM Consult Post Acute Care Choice: [place] arrangements for the past 2 months: Single Family Home                 DME Arranged: N/A         HH Arranged: RN, PT [place] Agency: [place] ([facility]) Date HH Agency Contacted: 01/16/20 Time [place]: 1219 Representative spoke with at [place]: [place] ([place]) Interventions    Readmission Risk Interventions No flowsheet data found.

## 2020-01-17 NOTE — Consult Note (Signed)
WOC Nurse Consult Note: Patient receiving care in [place]. A one piece, flexible urostomy pouch was placed after crusting with stoma powder and skin prep pads, then a barrier ring, and connected to a beside drainage bag. Val Riles, RN, MSN, CWOCN, CNS-BC, pager [phone]

## 2020-01-17 NOTE — Progress Notes (Signed)
Nutrition Follow-up  DOCUMENTATION CODES:   Underweight  INTERVENTION:   -Continue Jevity 1.2 @ 65 ml/hr via g-tube   Free water flushes per MD: 400 ml free water flushes every 6 hours  Tube feeding regimen provides 1872 kcal (100% of needs), 87 grams of protein, and 1259 ml of H2O. Total free water: 2859 ml   -Continue clear liquid diet for comfort  NUTRITION DIAGNOSIS:   Inadequate oral intake related to inability to eat as evidenced by NPO status.  Ongoing  GOAL:   Patient will meet greater than or equal to 90% of their needs  Met with TF  MONITOR:   Labs, Weight trends, TF tolerance, Skin, I & O's  REASON FOR ASSESSMENT:   Consult Enteral/tube feeding initiation and management  ASSESSMENT:   Thomas [name] is a 49 y.o. male with medical history significant of esophageal atresia s/p retrosternal colonic interposition with history of multiple surgeries with recent partial esophageal resection and creation of spit fistula in 06/2019 and high output GJ tube.  Patient presents with complaints of out of it.  Records note patient was admitted at [facility] from 1/11-1/15 and then at at [facility] from 1/23-1/29 for hypernatremia.  After discharge home patient was supposed to have been arranged home health, but he states they have not  come out because he keeps coming back into the hospital.  Patient notes that he had not been giving himself free water as they had previously instructed him.  He complains of feeling anxious and notes that his ostomy needs to be changes skin leaking.  Reviewed I/O's: +781 ml x 24 hours and -16.9 L since admission  UOP: 670 ml x 24 hours  Drain output: 1.4 L x 24 hours  Case discussed with RN, who reports pt remains with hypernatremia. Free water flush regimen of 400 ml free water every 6 hours just started today (order put in by MD yesterday, but not started until today per RN report). RN shares pt is tolerating TF regimen and water  flushes well. Reviewed pt hx with RN, which consisted of prior hospitalizations last month due to hypernatremia and pt was not administering water flushes PTA. Due to new free water flush orders just in effect, RD will continue current free water regimen at this time.  Pt remains on a clear liquid diet. RN confirms that pt consumes liquids for comfort, but liquids dispense into stoma drainage bag. Ensure and Boost Breeze supplements ordered by admissions nurse and these were discontinued.   Labs reviewed: Na: 152, CBGS: 101-123.   Diet Order:   Diet Order            Diet clear liquid Room service appropriate? Yes; Fluid consistency: Thin  Diet effective now              EDUCATION NEEDS:   No education needs have been identified at this time  Skin:  Skin Assessment: Skin Integrity Issues: Skin Integrity Issues:: Other (Comment) Other: stoma rt side of throat  Last BM:  Unknown  Height:   Ht Readings from Last 1 Encounters:  12/18/19 '5\' 6"'$  (1.676 m)    Weight:   Wt Readings from Last 1 Encounters:  01/15/20 45.1 kg    Ideal Body Weight:  64.5 kg  BMI:  Body mass index is 16.05 kg/m.  Estimated Nutritional Needs:   Kcal:  1800-2000  Protein:  90-105 grams  Fluid:  > 1.8 L    [name], RD, LDN, CDCES  Registered Dietitian II Certified Diabetes Care and Education Specialist Please refer to [facility] for RD and/or RD on-call/weekend/after hours pager

## 2020-01-18 LAB — CBC
HCT: 42.7 % (ref 39.0–52.0)
Hemoglobin: 12.7 g/dL — ABNORMAL LOW (ref 13.0–17.0)
MCH: 27.4 pg (ref 26.0–34.0)
MCHC: 29.7 g/dL — ABNORMAL LOW (ref 30.0–36.0)
MCV: 92 fL (ref 80.0–100.0)
Platelets: 131 10*3/uL — ABNORMAL LOW (ref 150–400)
RBC: 4.64 MIL/uL (ref 4.22–5.81)
RDW: 14.6 % (ref 11.5–15.5)
WBC: 6.9 10*3/uL (ref 4.0–10.5)
nRBC: 0 % (ref 0.0–0.2)

## 2020-01-18 LAB — BASIC METABOLIC PANEL
Anion gap: 12 (ref 5–15)
BUN: 21 mg/dL — ABNORMAL HIGH (ref 6–20)
CO2: 33 mmol/L — ABNORMAL HIGH (ref 22–32)
Calcium: 10.1 mg/dL (ref 8.9–10.3)
Chloride: 104 mmol/L (ref 98–111)
Creatinine, Ser: 0.85 mg/dL (ref 0.61–1.24)
GFR calc Af Amer: >60 mL/min (ref >60)
GFR calc non Af Amer: >60 mL/min (ref >60)
Glucose, Bld: 129 mg/dL — ABNORMAL HIGH (ref 70–99)
Potassium: 3.7 mmol/L (ref 3.5–5.1)
Sodium: 149 mmol/L — ABNORMAL HIGH (ref 135–145)

## 2020-01-18 LAB — GLUCOSE, CAPILLARY
Glucose-Capillary: 107 mg/dL — ABNORMAL HIGH (ref 70–99)
Glucose-Capillary: 104 mg/dL — ABNORMAL HIGH (ref 70–99)
Glucose-Capillary: 97 mg/dL (ref 70–99)
Glucose-Capillary: 86 mg/dL (ref 70–99)

## 2020-01-18 LAB — CULTURE, BLOOD (ROUTINE X 2)
Culture: NO GROWTH
Culture: NO GROWTH
Special Requests: ADEQUATE

## 2020-01-18 MED ORDER — JEVITY 1.2 CAL PO LIQD: 1000.0000 mL | ORAL | 12 refills | Status: DC

## 2020-01-18 MED ORDER — FREE WATER: 300.0000 mL | 12 refills | Status: DC

## 2020-01-18 MED ORDER — OXYCODONE HCL 5 MG PO TABS: 5.0000 mg | ORAL_TABLET | Freq: Three times a day (TID) | ORAL | 0 refills | Status: DC | PRN

## 2020-01-18 MED ORDER — GABAPENTIN 100 MG PO CAPS: 100.0000 mg | ORAL_CAPSULE | Freq: Two times a day (BID) | ORAL | 0 refills | Status: DC

## 2020-01-18 MED ORDER — TRAZODONE HCL 50 MG PO TABS: 50.0000 mg | ORAL_TABLET | Freq: Every day | ORAL | Status: AC

## 2020-01-18 MED ORDER — ZINC OXIDE 12.8 % EX OINT: TOPICAL_OINTMENT | Freq: Three times a day (TID) | CUTANEOUS | 0 refills | Status: DC

## 2020-01-18 MED ORDER — ACETAMINOPHEN 325 MG PO TABS: 650.0000 mg | ORAL_TABLET | Freq: Four times a day (QID) | ORAL | Status: DC | PRN

## 2020-01-18 MED ORDER — TERAZOSIN HCL 2 MG PO CAPS: 2.0000 mg | ORAL_CAPSULE | Freq: Every day | ORAL | Status: DC

## 2020-01-18 NOTE — Discharge Instructions (Signed)

## 2020-01-18 NOTE — TOC Progression Note (Addendum)
Transition of Care [facility]) - Progression Note    Patient Details  Name: Thomas [name] MRN: [id number] Date of Birth: 10-12-1971  Transition of Care [facility]) CM/SW Contact  Jacalyn Lefevre Edson Snowball, RN Phone Number: 01/18/2020, 4:21 PM  Clinical Narrative:    Spoke to patient and his mother at bedside earlier today. Patient lives with his girl friend who has PCS through [place] , explained PCP would have to complete paperwork for PCS.   Patient active with [place] RN,PT,aide. Start of care is tomorrow. Nurse will give patient extra supplies.  Patient being discharged today , entered tube feed orders Zack with [facility] aware. Patient does not have transportation home . PTAR paperwork completed. Bedside nurse can call when ready to discharge.   Confirmed address with patient.   Patient was on tube feeds prior to admission. [facility] given updated orders.  PTAR called for 5 : 30 pm    Expected Discharge Plan: [place] Barriers to Discharge: Continued Medical Work up  Expected Discharge Plan and Services Expected Discharge Plan: [place] In-house Referral: Nutrition Discharge Planning Services: CM Consult Post Acute Care Choice: Home Health, Durable Medical Equipment Living arrangements for the past 2 months: Single Family Home Expected Discharge Date: 01/18/20               DME Arranged: Francesco Runner feeding pump DME Agency: [facility] Date DME Agency Contacted: 01/18/20 Time DME Agency Contacted: 1620 Representative spoke with at DME Agency: [place] Arranged: RN, PT, Nurse's Aide [place] Agency: [place] ([place]) Date [place]: 01/18/20 Time [place]: 1620 Representative spoke with at [place]: [place] ([place]) Interventions    Readmission Risk Interventions No flowsheet data found.

## 2020-01-18 NOTE — Progress Notes (Signed)
Physical Therapy Treatment Patient Details Name: Thomas [name] MRN: [id number] DOB: 07/12/1971 Today's Date: 01/18/2020    History of Present Illness HPI: Thomas [name] is a 49 y.o. male with medical history significant of esophageal atresia s/p retrosternal colonic interposition with history of multiple surgeries with recent partial esophageal resection and creation of spit fistula in 06/2019 and high output GJ tube.  Patient presents with complaints of out of it.  Records note patient was admitted at [facility] from 1/11-1/15 and then at at [facility] from 1/23-1/29 for hypernatremia.     PT Comments    Pt is progressing towards goals. After encouragement he was agreeable to ambulate in hallway. During ambulation pt reported SOB however no increased effort in breathing was observed, as pt was able to continue speaking w/o sounding OOB and SpO2 was at 96%. Pt declined sitting upright in the chair stating it was too hard to breathe when upright. Wet linins were changed and pt returned to supine in bed. RN and mother present at end of session. Patient would benefit from continued skilled PT to maximize functional independence and activity tolerance. Will continue to follow acutely.      Follow Up Recommendations  Home health PT;Supervision - Intermittent(pt reports unable to do tasks in house)     Equipment Recommendations  None recommended by PT    Recommendations for Other Services       Precautions / Restrictions Precautions Precautions: Fall Precaution Comments: leaking stoma, g-tube Restrictions Weight Bearing Restrictions: No    Mobility  Bed Mobility Overal bed mobility: Needs Assistance Bed Mobility: Supine to Sit     Supine to sit: Supervision     General bed mobility comments: pt moving easily. HOB elevated. Supervision for safety with lines  Transfers Overall transfer level: Needs assistance Equipment used: Rolling walker (2 wheeled) Transfers: Sit to/from  Stand Sit to Stand: Min assist;Min guard         General transfer comment: first attempt pt was able to rise from EOB w/o assist. Second attemp from a chair w/o arm rests required min A to power up. cues for hand placement  Ambulation/Gait Ambulation/Gait assistance: Min guard Gait Distance (Feet): 250 Feet Assistive device: Rolling walker (2 wheeled) Gait Pattern/deviations: Step-through pattern;Trunk flexed   Gait velocity interpretation: 1.31 - 2.62 ft/sec, indicative of limited community ambulator General Gait Details: Cues for RW proximity, pt decline, stating he felt more stable with RW farther forward. Pt was able to ambulate 250 ft with min guard for safety. Overall steady with RW. At end of gait training pt reported SOB, however did not show any signs of increased effort to breathe and SpO2 was at 96%   Stairs             Wheelchair Mobility    Modified Rankin (Stroke Patients Only)       Balance Overall balance assessment: Needs assistance Sitting-balance support: Feet supported;No upper extremity supported Sitting balance-Leahy Scale: Good     Standing balance support: No upper extremity supported;During functional activity Standing balance-Leahy Scale: Fair                              Cognition Arousal/Alertness: Awake/alert Behavior During Therapy: Flat affect Overall Cognitive Status: Within Functional Limits for tasks assessed  General Comments: Pt very particular about his set up and the way he does things. Does better with a stand off approch.      Exercises      General Comments General comments (skin integrity, edema, etc.): stoma leaking and pt having to regurally apply towels to absorb moisture.      Pertinent Vitals/Pain Pain Assessment: No/denies pain    Home Living                      Prior Function            PT Goals (current goals can now be found in the  care plan section) Acute Rehab PT Goals Patient Stated Goal: get better PT Goal Formulation: With patient Time For Goal Achievement: 01/30/20 Potential to Achieve Goals: Good Progress towards PT goals: Progressing toward goals    Frequency    Min 3X/week      PT Plan Current plan remains appropriate    Co-evaluation              AM-PAC PT "6 Clicks" Mobility   Outcome Measure  Help needed turning from your back to your side while in a flat bed without using bedrails?: None Help needed moving from lying on your back to sitting on the side of a flat bed without using bedrails?: None Help needed moving to and from a bed to a chair (including a wheelchair)?: A Little Help needed standing up from a chair using your arms (e.g., wheelchair or bedside chair)?: A Little Help needed to walk in hospital room?: A Little Help needed climbing 3-5 steps with a railing? : A Little 6 Click Score: 20    End of Session   Activity Tolerance: Patient tolerated treatment well Patient left: in bed;with call bell/phone within reach;with nursing/sitter in room;with family/visitor present Nurse Communication: Mobility status PT Visit Diagnosis: Unsteadiness on feet (R26.81);Muscle weakness (generalized) (M62.81);Difficulty in walking, not elsewhere classified (R26.2)     Time: [id number] PT Time Calculation (min) (ACUTE ONLY): 28 min  Charges:  $Gait Training: 23-37 mins                     Thomas [name], [place] Pager [phone] Acute Rehab   Thomas [name] 01/18/2020, 12:23 PM

## 2020-01-18 NOTE — Discharge Summary (Signed)
Physician Discharge Summary  Thomas [name] [id number] DOB: 25-Oct-1971  PCP: [name], NP  Admitted from: Home Discharged to: Home  Admit date: 01/13/2020 Discharge date: 01/18/2020  Recommendations for Outpatient Follow-up:   Follow-up Information    [name], NP. Schedule an appointment as soon as possible for a visit in 5 day(s).   Specialty: Nurse Practitioner Why: To be seen with repeat labs (CBC & CMP). Contact information: [place] [address] [place], Glandorf, DO. Schedule an appointment as soon as possible for a visit.   Specialty: Cardiothoracic Surgery Why: Follow-up regarding further surgical needs. Contact information: [place] [address] [phone]            Home Health: PT, RN and home health aide Equipment/Devices: None  Discharge Condition: Improved and stable. CODE STATUS: Full. Diet recommendation: Clear liquid diet for comfort.  Continue prior PEG tube feeding.  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  Admitted again for hypernatremia.  Assessment & Plan:  Principal Problem:   Hypernatremia Active Problems:   Low grade fever   Esophageal  atresia   Gastrostomy tube in place [facility])   Hypovolemic hypernatremia:  This seems to be a recurrent issue.  Although he may be drinking a lot of free water, this leaks out off the esophageal split fistula and not absorbed.  He is likely not taking enough free water through the G-tube. Sodium 164 on admission.  He was hydrated with IV fluids and free water via G-tube was increased.  His serum sodium has gradually improved to 149 on day of discharge.  He remains asymptomatic without dizziness, lightheadedness or mental status changes.  He consumes clear liquids by mouth for pleasure but most of it leaks out through the split fistula.   He has been extensively counseled regarding importance of adequate hydration via the G-tube and he verbalizes understanding.  Case management has coordinated with home care agency regarding his tube feeds.  Recommend close follow-up with his PCP with repeat labs in a few days.  Esophageal atresia: S/p colonic interposition and split fistula (detailed history as noted above).  Increased fluid losses around the fistula with skin irritation.  Wound care input appreciated and placed a 1 piece, flexible urostomy pouch, barrier ring and connected to bedside drainage bag with good seal.  Do not see any leak of fluids outside.  As per chart review in care everywhere, Dr. Glo Herring, thoracic surgeon at [facility] was planning to refer him back to [place] for surgical needs.  Depression: Tele psych evaluated and recommended adding gabapentin or Trileptal.  Gabapentin initiated.  Recommend outpatient psychiatry evaluation and follow-up.  Acute on chronic pain syndrome: Patient reports that he has had appointments with [facility] management but unable to keep it because he keeps bouncing back  to the hospital each time.  Reviewed [place] PDMP and patient last filled 10 tablets of oxycodone 5 mg on 12/22/2019 which he reports he has run out.  Advised him that we will be  unable to write for long-term opioids and he is given a very short supply of oxycodone and advised to follow-up with his PCP.  Also on gabapentin as per psychiatry input.  Anemia of chronic disease:  Hemoglobin up to 12.7 today.  Thrombocytopenia:  Platelet counts have improved from 110s to 131 on day of discharge.  Follow CBC as outpatient.  BPH: Continue Terazosin.  Protein calorie malnutrition with hypoglycemia:  Occasional, mild and asymptomatic.  Continue tube feeds.  Body mass index is 16.05 kg/m.  Nutritional Status Nutrition Problem: Inadequate oral intake.  RD consulted. Etiology: inability to eat Signs/Symptoms: NPO status Interventions: Tube feeding    Consultants:   None  Procedures:   None  Discharge Instructions  Discharge Instructions    Call MD for:  difficulty breathing, headache or visual disturbances   Complete by: As directed    Call MD for:  extreme fatigue   Complete by: As directed    Call MD for:  persistant dizziness or light-headedness   Complete by: As directed    Call MD for:  persistant nausea and vomiting   Complete by: As directed    Call MD for:  redness, tenderness, or signs of infection (pain, swelling, redness, odor or green/yellow discharge around incision site)   Complete by: As directed    Call MD for:  severe uncontrolled pain   Complete by: As directed    Call MD for:  temperature >100.4   Complete by: As directed    Diet clear liquid   Complete by: As directed    Continue tube feeds.   Discharge wound care:   Complete by: As directed    As advised by wound care nurse from the hospital.   Increase activity slowly   Complete by: As directed        Medication List    STOP taking these medications   tamsulosin 0.4 MG Caps capsule Commonly known as: FLOMAX     TAKE these medications   acetaminophen 325 MG tablet Commonly known as: TYLENOL Place 2 tablets (650 mg total) into feeding tube every 6 (six) hours  as needed for mild pain or moderate pain.   Carboxymethylcellulose Sodium 0.25 % Soln Place 1 drop into both eyes 2 (two) times daily as needed for dry eyes.   feeding supplement (JEVITY 1.2 CAL) Liqd Place 1,000 mLs into feeding tube continuous. 1,000 mL, Per Tube, at 65 mL/hr, Continuous What changed:   how much to take  how to take this  when to take this  additional instructions   free water Soln Place 300 mLs into feeding tube every 4 (four) hours.   gabapentin 100 MG capsule Commonly known as: NEURONTIN Take 1 capsule (100 mg total) by mouth 2 (two) times daily. Open capsule, mix contents with water and take via G tube.   oxyCODONE 5 MG immediate release tablet Commonly known as: Oxy IR/ROXICODONE Place 1 tablet (5 mg total) into feeding tube every 8 (eight) hours as needed for severe pain.   Salonpas 3.12-12-08 % Ptch Generic drug: Camphor-Menthol-Methyl Sal Apply 1 patch topically daily.   terazosin 2 MG capsule Commonly known as: HYTRIN Place 1 capsule (2 mg total) into feeding tube at bedtime. What changed: how to take this   traZODone 50  MG tablet Commonly known as: DESYREL Place 1 tablet (50 mg total) into feeding tube at bedtime. What changed: how to take this   Zinc Oxide 12.8 % ointment Commonly known as: TRIPLE PASTE Apply topically 3 (three) times daily. Apply to abdominal, chest and back rash      Allergies  Allergen Reactions  . Aspirin Other (See Comments)    Burns stomach   . Penicillins     Did it involve swelling of the face/tongue/throat, SOB, or low BP? Unknown Did it involve sudden or severe rash/hives, skin peeling, or any reaction on the inside of your mouth or nose? Unknown Did you need to seek medical attention at a hospital or doctor's office? Unknown When did it last happen?patient refuses to take due to parents being allergic If all above answers are "NO", may proceed with cephalosporin use. Parents allergic        Procedures/Studies: DG Chest Port 1 View  Addendum Date: 01/13/2020   ADDENDUM REPORT: 01/13/2020 13:39 ADDENDUM: After viewing the abdominal x-ray obtained concurrently, the apparent calcifications beneath the RIGHT hemidiaphragm in fact represents extravasated contrast material/barium which is present throughout the upper abdomen. Electronically Signed   By: Evangeline Dakin M.D.   On: 01/13/2020 13:39   Result Date: 01/13/2020 CLINICAL DATA:  49 year old presenting with extreme anxiety and possible panic attack. EXAM: PORTABLE CHEST 1 VIEW COMPARISON:  08/17/2019 and earlier including CT chest 08/07/2019. FINDINGS: Cardiomediastinal silhouette unremarkable and unchanged. Hyperinflation and emphysematous changes throughout both lungs as noted previously. Chronic pleuroparenchymal scarring at the RIGHT base at the site of a prior pneumonia. Lungs otherwise clear. Pulmonary vascularity normal. No visible pleural effusions currently. Calcifications beneath the RIGHT hemidiaphragm shown on prior CT to likely involve the liver capsule. IMPRESSION: No acute cardiopulmonary disease. COPD/emphysema. Chronic pleuroparenchymal scarring at the RIGHT base. Electronically Signed: By: Evangeline Dakin M.D. On: 01/13/2020 13:32   DG Abd Portable 1 View  Result Date: 01/13/2020 CLINICAL DATA:  49 year old with an indwelling gastrostomy tube. EXAM: PORTABLE ABDOMEN - 1 VIEW COMPARISON:  CT abdomen and pelvis 07/07/2019 and earlier. FINDINGS: The gastrostomy tube projects over the expected location of the mid body of the stomach. There is no jejunal catheter currently as was seen on the prior CT 07/07/2019. Visualized upper abdominal bowel gas pattern unremarkable. Extravasated contrast material/barium throughout the visualized upper abdomen as noted on that CT. IVC filter as noted previously. IMPRESSION: Gastrostomy tube projects over the expected location of the mid body of the stomach. Electronically Signed   By:  Evangeline Dakin M.D.   On: 01/13/2020 13:37      Subjective: Denied any specific complaints.  Chest wall pouch has pink color liquid in it and he states that it is because of eating Jell-O and he plans to quit eating Jell-O because he feels that the output has increased.  Did not report pain but has been taking oxycodone here.  Per nursing, no acute issues reported and ambulated with PT.  No dizziness or lightheadedness.  Discharge Exam:  Vitals:   01/17/20 1403 01/17/20 2039 01/18/20 0420 01/18/20 0500  BP: 109/67 134/68 101/68   Pulse: 75 62 70   Resp: 18 14 16    Temp: 99 F (37.2 C) 97.8 F (36.6 C) (!) 97.5 F (36.4 C)   TempSrc: Oral Oral Oral   SpO2: 100% 100% 100%   Weight:    45.3 kg    General exam: Young male, moderately built, frail and chronically ill looking lying comfortably  propped up in bed.  Oral mucosa moist. Respiratory system: Clear to auscultation. Respiratory effort normal. Cardiovascular system: S1 & S2 heard, RRR. No JVD, murmurs, rubs, gallops or clicks. No pedal edema. Gastrointestinal system: Abdomen is nondistended, soft and nontender. No organomegaly or masses felt. Normal bowel sounds heard.  PEG tube without acute findings. Central nervous system: Alert and oriented. No focal neurological deficits. Extremities: Symmetric 5 x 5 power. Skin: Has split fistula on his anterior chest with ostomy bag sealed appropriately.  Extensive chronic excoriated skin of anterior chest/sternal area without acute findings concerning for infection. Psychiatry: Judgement and insight appear normal. Mood & affect appropriate.    The results of significant diagnostics from this hospitalization (including imaging, microbiology, ancillary and laboratory) are listed below for reference.     Microbiology: Recent Results (from the past 240 hour(s))  Respiratory Panel by RT PCR (Flu A&B, Covid) - Nasopharyngeal Swab     Status: None   Collection Time: 01/13/20 12:38 PM    Specimen: Nasopharyngeal Swab  Result Value Ref Range Status   SARS Coronavirus 2 by RT PCR NEGATIVE NEGATIVE Final    Comment: (NOTE) SARS-CoV-2 target nucleic acids are NOT DETECTED. The SARS-CoV-2 RNA is generally detectable in upper respiratoy specimens during the acute phase of infection. The lowest concentration of SARS-CoV-2 viral copies this assay can detect is 131 copies/mL. A negative result does not preclude SARS-Cov-2 infection and should not be used as the sole basis for treatment or other patient management decisions. A negative result may occur with  improper specimen collection/handling, submission of specimen other than nasopharyngeal swab, presence of viral mutation(s) within the areas targeted by this assay, and inadequate number of viral copies (<131 copies/mL). A negative result must be combined with clinical observations, patient history, and epidemiological information. The expected result is Negative. Fact Sheet for Patients:  PinkCheek.be Fact Sheet for Healthcare Providers:  GravelBags.it This test is not yet ap proved or cleared by the [place] FDA and  has been authorized for detection and/or diagnosis of SARS-CoV-2 by FDA under an Emergency Use Authorization (EUA). This EUA will remain  in effect (meaning this test can be used) for the duration of the COVID-19 declaration under Section 564(b)(1) of the Act, 21 U.S.C. section 360bbb-3(b)(1), unless the authorization is terminated or revoked sooner.    Influenza A by PCR NEGATIVE NEGATIVE Final   Influenza B by PCR NEGATIVE NEGATIVE Final    Comment: (NOTE) The Xpert Xpress SARS-CoV-2/FLU/RSV assay is intended as an aid in  the diagnosis of influenza from Nasopharyngeal swab specimens and  should not be used as a sole basis for treatment. Nasal washings and  aspirates are unacceptable for Xpert Xpress SARS-CoV-2/FLU/RSV  testing. Fact Sheet  for Patients: PinkCheek.be Fact Sheet for Healthcare Providers: GravelBags.it This test is not yet approved or cleared by the [place] FDA and  has been authorized for detection and/or diagnosis of SARS-CoV-2 by  FDA under an Emergency Use Authorization (EUA). This EUA will remain  in effect (meaning this test can be used) for the duration of the  Covid-19 declaration under Section 564(b)(1) of the Act, 21  U.S.C. section 360bbb-3(b)(1), unless the authorization is  terminated or revoked. Performed at [facility], [place] [address], [place], [place] [address]   Culture, blood (routine x 2)     Status: None   Collection Time: 01/13/20 12:50 PM   Specimen: BLOOD  Result Value Ref Range Status   Specimen Description BLOOD LEFT  ANTECUBITAL  Final   Special Requests   Final    BOTTLES DRAWN AEROBIC AND ANAEROBIC Blood Culture adequate volume   Culture   Final    NO GROWTH 5 DAYS Performed at [facility], [address] [place], [place] [address]    Report Status 01/18/2020 FINAL  Final  Culture, blood (routine x 2)     Status: None   Collection Time: 01/13/20 12:55 PM   Specimen: BLOOD  Result Value Ref Range Status   Specimen Description BLOOD RIGHT ANTECUBITAL  Final   Special Requests   Final    BOTTLES DRAWN AEROBIC AND ANAEROBIC Blood Culture results may not be optimal due to an inadequate volume of blood received in culture bottles   Culture   Final    NO GROWTH 5 DAYS Performed at [facility], [place] [address] [place], [place] [address]    Report Status 01/18/2020 FINAL  Final     Labs: CBC: Recent Labs  Lab 01/13/20 1252 01/13/20 1252 01/13/20 1255 01/14/20 0616 01/14/20 1835 01/15/20 0621 01/18/20 0619  WBC 10.0  --   --  9.5 7.8 7.0 6.9  NEUTROABS 7.6  --   --   --   --   --   --   HGB 14.6   < > 15.6 10.7* 11.0* 11.0* 12.7*  HCT 51.7   < > 46.0 35.9* 37.1* 38.0* 42.7  MCV 95.9   --   --  92.5 92.3 93.4 92.0  PLT 153  --   --  124* 113* 114* 131*   < > = values in this interval not displayed.    Basic Metabolic Panel: Recent Labs  Lab 01/14/20 0616 01/14/20 1115 01/15/20 0621 01/15/20 1041 01/16/20 0720 01/17/20 0549 01/18/20 0619  NA 146*   < > 151* 153* 152* 152* 149*  K 3.1*  --  3.9  --  4.3 3.8 3.7  CL 103  --  110  --  110 108 104  CO2 34*  --  34*  --  32 33* 33*  GLUCOSE 119*  --  95  --  91 106* 129*  BUN 42*  --  26*  --  27* 27* 21*  CREATININE 0.95  --  0.75  --  0.74 0.82 0.85  CALCIUM 9.1  --  9.7  --  9.6 10.1 10.1  MG 2.3  --   --   --   --   --   --    < > = values in this interval not displayed.    Liver Function Tests: Recent Labs  Lab 01/13/20 1252  AST 20  ALT 27  ALKPHOS 71  BILITOT 0.3  PROT 7.1  ALBUMIN 4.0    CBG: Recent Labs  Lab 01/17/20 2333 01/18/20 0423 01/18/20 0807 01/18/20 1336 01/18/20 1558  GLUCAP 71 107* 104* 97 86     Urinalysis    Component Value Date/Time   COLORURINE YELLOW 01/13/2020 [place] 01/13/2020 1313   LABSPEC 1.027 01/13/2020 1313   PHURINE 8.0 01/13/2020 1313   GLUCOSEU NEGATIVE 01/13/2020 1313   HGBUR NEGATIVE 01/13/2020 1313   BILIRUBINUR NEGATIVE 01/13/2020 1313   KETONESUR NEGATIVE 01/13/2020 1313   PROTEINUR 30 (A) 01/13/2020 1313   UROBILINOGEN 0.2 07/26/2014 1325   NITRITE NEGATIVE 01/13/2020 1313   LEUKOCYTESUR TRACE (A) 01/13/2020 1313      Time coordinating discharge: 45 minutes  SIGNED:  Vernell Leep, MD, FACP, [facility]  [facility]  To contact the attending provider between 7A-7P or the covering provider during after hours 7P-7A, please log into the web site [email or web address] and access using universal [place] password for that web site. If you do not have the password, please call the hospital operator.

## 2020-01-18 NOTE — Progress Notes (Signed)
Nutrition Follow-up  DOCUMENTATION CODES:   Severe malnutrition in context of chronic illness, Underweight  INTERVENTION:   -ContinueJevity 1.2@ 31ml/hr via g-tube   Free water flushes per MD: 400 ml free water flushes every 6 hours  Tube feeding regimen provides1872kcal (100% of needs),87grams of protein, and 1249ml of H2O. Total free water: 2859 ml   -Continue clear liquid diet for comfort  NUTRITION DIAGNOSIS:   Severe Malnutrition related to chronic illness(esophageal atresia) as evidenced by percent weight loss, severe fat depletion, severe muscle depletion.  Ongoing  GOAL:   Patient will meet greater than or equal to 90% of their needs  Progressing   MONITOR:   Labs, Weight trends, TF tolerance, Skin, I & O's  REASON FOR ASSESSMENT:   Consult Enteral/tube feeding initiation and management  ASSESSMENT:   Thomas [name] is a 49 y.o. male with medical history significant of esophageal atresia s/p retrosternal colonic interposition with history of multiple surgeries with recent partial esophageal resection and creation of spit fistula in 06/2019 and high output GJ tube.  Patient presents with complaints of out of it.  Records note patient was admitted at [facility] from 1/11-1/15 and then at at [facility] from 1/23-1/29 for hypernatremia.  After discharge home patient was supposed to have been arranged home health, but he states they have not  come out because he keeps coming back into the hospital.  Patient notes that he had not been giving himself free water as they had previously instructed him.  He complains of feeling anxious and notes that his ostomy needs to be changes skin leaking.  Reviewed I/O's: -9.1 L x 24 hours and -26 L since admission  UOP: 1 L x 24 hours  Drain output: 9.4 L x 24 hours   Pt sitting up in bed, talking on phone and drinking liquids at time of visit. Pt with visible generalized severe fat and muscle depletions.   Jevity 1.2  infusing @ 65 ml/hr via g-tube. Pt also receiving 400 ml free water flushes every 6 hours per MD. Complete TF regimen provides 1872 kcals, 87 grams protein, and 2859 ml free water daily, meeting 100% of estimated kcal and protein needs.   Labs reviewed: Na: 149 (improved- 152 on 01/16/20), CBGS: 60-104.   Diet Order:   Diet Order            Diet clear liquid Room service appropriate? Yes; Fluid consistency: Thin  Diet effective now              EDUCATION NEEDS:   No education needs have been identified at this time  Skin:  Skin Assessment: Skin Integrity Issues: Skin Integrity Issues:: Other (Comment) Other: stoma rt side of throat  Last BM:  01/18/20 (700 ml via esophagostomy)  Height:   Ht Readings from Last 1 Encounters:  12/18/19 5\' 6"  (1.676 m)    Weight:   Wt Readings from Last 1 Encounters:  01/18/20 45.3 kg    Ideal Body Weight:  64.5 kg  BMI:  Body mass index is 16.12 kg/m.  Estimated Nutritional Needs:   Kcal:  1800-2000  Protein:  90-105 grams  Fluid:  > 1.8 L    [name], RD, LDN, [place] Registered Dietitian II Certified Diabetes Care and Education Specialist Please refer to [facility] for RD and/or RD on-call/weekend/after hours pager

## 2020-01-18 NOTE — Plan of Care (Signed)
  Problem: Safety: Goal: Ability to remain free from injury will improve Outcome: Progressing   

## 2020-01-19 LAB — FOLATE RBC
Folate, Hemolysate: 494 ng/mL
Folate, RBC: 1395 ng/mL (ref >498)
Hematocrit: 35.4 %

## 2020-01-19 NOTE — Progress Notes (Signed)
Pt discharged with PTAR 

## 2020-01-27 ENCOUNTER — Inpatient Hospital Stay (HOSPITAL_COMMUNITY)
Admission: EM | Admit: 2020-01-27 | Discharge: 2020-02-01 | DRG: 640 | Disposition: A | Discharge status: Home Health | Payer: Medicaid Other | Attending: Internal Medicine | Admitting: Internal Medicine

## 2020-01-27 ENCOUNTER — Inpatient Hospital Stay (HOSPITAL_COMMUNITY): Payer: Medicaid Other

## 2020-01-27 ENCOUNTER — Other Ambulatory Visit: Payer: Self-pay

## 2020-01-27 ENCOUNTER — Encounter (HOSPITAL_COMMUNITY): Payer: Self-pay | Admitting: Emergency Medicine

## 2020-01-27 DIAGNOSIS — K219 Gastro-esophageal reflux disease without esophagitis: Secondary | ICD-10-CM | POA: Diagnosis present

## 2020-01-27 DIAGNOSIS — Z87738 Personal history of other specified (corrected) congenital malformations of digestive system: Secondary | ICD-10-CM | POA: Diagnosis not present

## 2020-01-27 DIAGNOSIS — Z880 Allergy status to penicillin: Secondary | ICD-10-CM | POA: Diagnosis not present

## 2020-01-27 DIAGNOSIS — R05 Cough: Secondary | ICD-10-CM

## 2020-01-27 DIAGNOSIS — Z20822 Contact with and (suspected) exposure to covid-19: Secondary | ICD-10-CM | POA: Diagnosis present

## 2020-01-27 DIAGNOSIS — Z931 Gastrostomy status: Secondary | ICD-10-CM | POA: Diagnosis not present

## 2020-01-27 DIAGNOSIS — Z886 Allergy status to analgesic agent status: Secondary | ICD-10-CM

## 2020-01-27 DIAGNOSIS — Z811 Family history of alcohol abuse and dependence: Secondary | ICD-10-CM

## 2020-01-27 DIAGNOSIS — K223 Perforation of esophagus: Secondary | ICD-10-CM

## 2020-01-27 DIAGNOSIS — Q390 Atresia of esophagus without fistula: Secondary | ICD-10-CM | POA: Diagnosis not present

## 2020-01-27 DIAGNOSIS — Z8249 Family history of ischemic heart disease and other diseases of the circulatory system: Secondary | ICD-10-CM

## 2020-01-27 DIAGNOSIS — Z87891 Personal history of nicotine dependence: Secondary | ICD-10-CM | POA: Diagnosis not present

## 2020-01-27 DIAGNOSIS — Z681 Body mass index (BMI) 19 or less, adult: Secondary | ICD-10-CM | POA: Diagnosis not present

## 2020-01-27 DIAGNOSIS — R059 Cough, unspecified: Secondary | ICD-10-CM

## 2020-01-27 DIAGNOSIS — E43 Unspecified severe protein-calorie malnutrition: Secondary | ICD-10-CM | POA: Diagnosis present

## 2020-01-27 DIAGNOSIS — E870 Hyperosmolality and hypernatremia: Principal | ICD-10-CM | POA: Diagnosis present

## 2020-01-27 DIAGNOSIS — R0902 Hypoxemia: Secondary | ICD-10-CM | POA: Diagnosis present

## 2020-01-27 DIAGNOSIS — N179 Acute kidney failure, unspecified: Secondary | ICD-10-CM | POA: Diagnosis present

## 2020-01-27 LAB — URINALYSIS, ROUTINE W REFLEX MICROSCOPIC
Bacteria, UA: NONE SEEN
Bilirubin Urine: NEGATIVE
Glucose, UA: NEGATIVE mg/dL
Hgb urine dipstick: NEGATIVE
Ketones, ur: NEGATIVE mg/dL
Leukocytes,Ua: NEGATIVE
Nitrite: NEGATIVE
Protein, ur: 30 mg/dL — AB
Specific Gravity, Urine: 1.026 (ref 1.005–1.030)
pH: 5 (ref 5.0–8.0)

## 2020-01-27 LAB — RESPIRATORY PANEL BY RT PCR (FLU A&B, COVID)
Influenza A by PCR: NEGATIVE
Influenza B by PCR: NEGATIVE
SARS Coronavirus 2 by RT PCR: NEGATIVE

## 2020-01-27 LAB — COMPREHENSIVE METABOLIC PANEL
ALT: 33 U/L (ref 0–44)
AST: 28 U/L (ref 15–41)
Albumin: 5.1 g/dL — ABNORMAL HIGH (ref 3.5–5.0)
Alkaline Phosphatase: 124 U/L (ref 38–126)
Anion gap: 12 (ref 5–15)
BUN: 72 mg/dL — ABNORMAL HIGH (ref 6–20)
CO2: 38 mmol/L — ABNORMAL HIGH (ref 22–32)
Calcium: 11.4 mg/dL — ABNORMAL HIGH (ref 8.9–10.3)
Chloride: 112 mmol/L — ABNORMAL HIGH (ref 98–111)
Creatinine, Ser: 1.52 mg/dL — ABNORMAL HIGH (ref 0.61–1.24)
GFR calc Af Amer: >60 mL/min (ref >60)
GFR calc non Af Amer: 53 mL/min — ABNORMAL LOW (ref >60)
Glucose, Bld: 108 mg/dL — ABNORMAL HIGH (ref 70–99)
Potassium: 4.7 mmol/L (ref 3.5–5.1)
Sodium: 162 mmol/L (ref 135–145)
Total Bilirubin: 0.5 mg/dL (ref 0.3–1.2)
Total Protein: 8.5 g/dL — ABNORMAL HIGH (ref 6.5–8.1)

## 2020-01-27 LAB — CBC WITH DIFFERENTIAL/PLATELET
Abs Immature Granulocytes: 0.04 10*3/uL (ref 0.00–0.07)
Basophils Absolute: 0.1 10*3/uL (ref 0.0–0.1)
Basophils Relative: 1 %
Eosinophils Absolute: 0 10*3/uL (ref 0.0–0.5)
Eosinophils Relative: 0 %
HCT: 58 % — ABNORMAL HIGH (ref 39.0–52.0)
Hemoglobin: 16.8 g/dL (ref 13.0–17.0)
Immature Granulocytes: 0 %
Lymphocytes Relative: 16 %
Lymphs Abs: 1.7 10*3/uL (ref 0.7–4.0)
MCH: 27.5 pg (ref 26.0–34.0)
MCHC: 29 g/dL — ABNORMAL LOW (ref 30.0–36.0)
MCV: 94.9 fL (ref 80.0–100.0)
Monocytes Absolute: 0.7 10*3/uL (ref 0.1–1.0)
Monocytes Relative: 6 %
Neutro Abs: 8.3 10*3/uL — ABNORMAL HIGH (ref 1.7–7.7)
Neutrophils Relative %: 77 %
Platelets: 303 10*3/uL (ref 150–400)
RBC: 6.11 MIL/uL — ABNORMAL HIGH (ref 4.22–5.81)
RDW: 16.6 % — ABNORMAL HIGH (ref 11.5–15.5)
WBC: 10.9 10*3/uL — ABNORMAL HIGH (ref 4.0–10.5)
nRBC: 0 % (ref 0.0–0.2)

## 2020-01-27 LAB — LIPASE, BLOOD: Lipase: 33 U/L (ref 11–51)

## 2020-01-27 MED ORDER — ONDANSETRON HCL 4 MG/2ML IJ SOLN: 4.0000 mg | Freq: Four times a day (QID) | INTRAMUSCULAR | Status: DC | PRN

## 2020-01-27 MED ORDER — ACETAMINOPHEN 325 MG PO TABS
650.0000 mg | ORAL_TABLET | Freq: Four times a day (QID) | ORAL | Status: DC | PRN
  Administered 2020-01-30 – 2020-02-01 (×5): 650 mg via ORAL
  Filled 2020-01-27 (×5): qty 2

## 2020-01-27 MED ORDER — SODIUM CHLORIDE 0.9 % IV BOLUS
1000.0000 mL | Freq: Once | INTRAVENOUS | Status: AC
  Administered 2020-01-27: 1000 mL via INTRAVENOUS

## 2020-01-27 MED ORDER — ACETAMINOPHEN 650 MG RE SUPP: 650.0000 mg | Freq: Four times a day (QID) | RECTAL | Status: DC | PRN

## 2020-01-27 MED ORDER — TRAZODONE HCL 50 MG PO TABS
50.0000 mg | ORAL_TABLET | Freq: Every day | ORAL | Status: DC
  Administered 2020-01-27 – 2020-01-31 (×5): 50 mg
  Filled 2020-01-27 (×5): qty 1

## 2020-01-27 MED ORDER — OXYCODONE HCL 5 MG PO TABS
5.0000 mg | ORAL_TABLET | Freq: Three times a day (TID) | ORAL | Status: DC | PRN
  Administered 2020-01-28 – 2020-01-30 (×4): 5 mg
  Filled 2020-01-27 (×5): qty 1

## 2020-01-27 MED ORDER — ZINC OXIDE 12.8 % EX OINT: TOPICAL_OINTMENT | CUTANEOUS | Status: DC | PRN

## 2020-01-27 MED ORDER — SODIUM CHLORIDE 0.45 % IV SOLN: INTRAVENOUS | Status: DC

## 2020-01-27 MED ORDER — ONDANSETRON HCL 4 MG PO TABS: 4.0000 mg | ORAL_TABLET | Freq: Four times a day (QID) | ORAL | Status: DC | PRN

## 2020-01-27 MED ORDER — GABAPENTIN 100 MG PO CAPS
100.0000 mg | ORAL_CAPSULE | Freq: Two times a day (BID) | ORAL | Status: DC
  Administered 2020-01-28 – 2020-02-01 (×9): 100 mg via ORAL
  Filled 2020-01-27 (×10): qty 1

## 2020-01-27 MED ORDER — CHLORHEXIDINE GLUCONATE 0.12 % MT SOLN
15.0000 mL | Freq: Two times a day (BID) | OROMUCOSAL | Status: DC
  Administered 2020-01-28 (×3): 15 mL via OROMUCOSAL
  Filled 2020-01-27 (×6): qty 15

## 2020-01-27 MED ORDER — JEVITY 1.2 CAL PO LIQD
1000.0000 mL | ORAL | Status: DC
  Administered 2020-01-28 – 2020-01-30 (×3): 1000 mL

## 2020-01-27 MED ORDER — MORPHINE SULFATE (PF) 4 MG/ML IV SOLN
4.0000 mg | Freq: Once | INTRAVENOUS | Status: AC
  Administered 2020-01-27: 4 mg via INTRAVENOUS
  Filled 2020-01-27: qty 1

## 2020-01-27 MED ORDER — ENOXAPARIN SODIUM 30 MG/0.3ML ~~LOC~~ SOLN
30.0000 mg | Freq: Every day | SUBCUTANEOUS | Status: DC
  Administered 2020-01-27: 30 mg via SUBCUTANEOUS
  Filled 2020-01-27: qty 0.3

## 2020-01-27 MED ORDER — FREE WATER
300.0000 mL | Status: DC
  Administered 2020-01-28 – 2020-02-01 (×26): 300 mL

## 2020-01-27 MED ORDER — ORAL CARE MOUTH RINSE
15.0000 mL | Freq: Two times a day (BID) | OROMUCOSAL | Status: DC
  Administered 2020-01-28 – 2020-01-31 (×5): 15 mL via OROMUCOSAL

## 2020-01-27 MED ORDER — POLYETHYLENE GLYCOL 3350 17 G PO PACK: 17.0000 g | PACK | Freq: Every day | ORAL | Status: DC | PRN
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
## 2020-01-27 NOTE — ED Notes (Signed)
Date and time results received: 01/27/20 1954  Test: Sodium Critical Value: 162  Name of Provider Notified: MD Lacinda Axon  Orders Received? Or Actions Taken?: acknowledged

## 2020-01-27 NOTE — ED Triage Notes (Signed)
Patient brought in via EMS from home. Alert and oriented. Patient c/o generalized body aches with dry cough. Patient unsure of any fevers. Patient has peg tube that has leak that patient is concerned my cause infection. Patient also reports pressure ulcer to sacrum with pain.

## 2020-01-28 ENCOUNTER — Encounter (HOSPITAL_COMMUNITY): Payer: Self-pay

## 2020-01-28 ENCOUNTER — Inpatient Hospital Stay (HOSPITAL_COMMUNITY): Payer: Medicaid Other

## 2020-01-28 DIAGNOSIS — Z931 Gastrostomy status: Secondary | ICD-10-CM

## 2020-01-28 DIAGNOSIS — Q390 Atresia of esophagus without fistula: Secondary | ICD-10-CM

## 2020-01-28 LAB — BASIC METABOLIC PANEL
Anion gap: 13 (ref 5–15)
Anion gap: 9 (ref 5–15)
Anion gap: 8 (ref 5–15)
BUN: 67 mg/dL — ABNORMAL HIGH (ref 6–20)
BUN: 61 mg/dL — ABNORMAL HIGH (ref 6–20)
BUN: 56 mg/dL — ABNORMAL HIGH (ref 6–20)
CO2: 32 mmol/L (ref 22–32)
CO2: 37 mmol/L — ABNORMAL HIGH (ref 22–32)
CO2: 35 mmol/L — ABNORMAL HIGH (ref 22–32)
Calcium: 10.1 mg/dL (ref 8.9–10.3)
Calcium: 10.3 mg/dL (ref 8.9–10.3)
Calcium: 10.4 mg/dL — ABNORMAL HIGH (ref 8.9–10.3)
Chloride: 117 mmol/L — ABNORMAL HIGH (ref 98–111)
Chloride: 112 mmol/L — ABNORMAL HIGH (ref 98–111)
Chloride: 111 mmol/L (ref 98–111)
Creatinine, Ser: 1.37 mg/dL — ABNORMAL HIGH (ref 0.61–1.24)
Creatinine, Ser: 1.2 mg/dL (ref 0.61–1.24)
Creatinine, Ser: 1.17 mg/dL (ref 0.61–1.24)
GFR calc Af Amer: >60 mL/min (ref >60)
GFR calc Af Amer: >60 mL/min (ref >60)
GFR calc Af Amer: >60 mL/min (ref >60)
GFR calc non Af Amer: >60 mL/min (ref >60)
GFR calc non Af Amer: >60 mL/min (ref >60)
GFR calc non Af Amer: >60 mL/min (ref >60)
Glucose, Bld: 99 mg/dL (ref 70–99)
Glucose, Bld: 105 mg/dL — ABNORMAL HIGH (ref 70–99)
Glucose, Bld: 142 mg/dL — ABNORMAL HIGH (ref 70–99)
Potassium: 3.6 mmol/L (ref 3.5–5.1)
Potassium: 3.5 mmol/L (ref 3.5–5.1)
Potassium: 3.9 mmol/L (ref 3.5–5.1)
Sodium: 162 mmol/L (ref 135–145)
Sodium: 158 mmol/L — ABNORMAL HIGH (ref 135–145)
Sodium: 154 mmol/L — ABNORMAL HIGH (ref 135–145)

## 2020-01-28 LAB — CBC
HCT: 50.8 % (ref 39.0–52.0)
Hemoglobin: 14.3 g/dL (ref 13.0–17.0)
MCH: 27.4 pg (ref 26.0–34.0)
MCHC: 28.1 g/dL — ABNORMAL LOW (ref 30.0–36.0)
MCV: 97.5 fL (ref 80.0–100.0)
Platelets: 235 10*3/uL (ref 150–400)
RBC: 5.21 MIL/uL (ref 4.22–5.81)
RDW: 15.2 % (ref 11.5–15.5)
WBC: 10 10*3/uL (ref 4.0–10.5)
nRBC: 0 % (ref 0.0–0.2)

## 2020-01-28 MED ORDER — ENOXAPARIN SODIUM 40 MG/0.4ML ~~LOC~~ SOLN
40.0000 mg | Freq: Every day | SUBCUTANEOUS | Status: DC
  Filled 2020-01-28 (×4): qty 0.4

## 2020-01-28 MED ORDER — NAPHAZOLINE-PHENIRAMINE 0.025-0.3 % OP SOLN
1.0000 [drp] | Freq: Four times a day (QID) | OPHTHALMIC | Status: DC | PRN
  Administered 2020-01-28 – 2020-01-29 (×2): 1 [drp] via OPHTHALMIC
  Filled 2020-01-28: qty 5

## 2020-01-28 MED ORDER — DEXTROSE 5 % IV SOLN: INTRAVENOUS | Status: DC

## 2020-01-28 MED ORDER — MORPHINE SULFATE (PF) 2 MG/ML IV SOLN
2.0000 mg | INTRAVENOUS | Status: AC | PRN
  Administered 2020-01-28 (×3): 2 mg via INTRAVENOUS
  Filled 2020-01-28 (×3): qty 1

## 2020-01-28 NOTE — Progress Notes (Signed)
BP 92/58, taken manually. MD notified. No new orders.

## 2020-01-28 NOTE — Progress Notes (Signed)
Patient states that he cannot receive medication through PEG tube due to tube cracked. PEG tube flushed and started leaking at stopcock. APP notified. Order to not use tube and let hospitalist reassess in the AM. Medications returned to pyxis at the time. Patient requested the medication to be given even though it is cracked. Explained to patient that the PEG tube will not be used

## 2020-01-28 NOTE — Progress Notes (Signed)
PROGRESS NOTE [place] [facility]   Thomas [name]  [id number]  DOB: 29-Jan-1971  DOA: 01/27/2020 PCP: [name], NP   Brief Admission Hx: 49 y.o. male with medical history significant for complicated surgical history of esophageal atresia, multiple surgeries s/p retrosternal colonic interposition, recent partial esophageal resection and creation of split fistula and high output GJ tube.  Also multiple recent admissions 1/11- 1/15 and then 1/23-1/29 at [facility], 2/6 - 01/18/20 for hypernatremia.  He was admitted with recurrent hypernatremia.    MDM/Assessment & Plan:   1. Hypernatremia - recurrent issue for this patient and concerns about compliance with free water.  I have ordered D5W at 150 cc/o and will follow sodium until it is improved.   2. AKI - prerenal - treating with IV fluids.  3. Esophageal atresia - he is s/p multiple surgeries s/p retrosternal colonic interposition, recent partial esophageal resection and creation of split fistula and high output GJ tube.  He reports that he follows up with surgery at [facility].   4. 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 Will ask case management to assist with finding a new outer piece for his PEG.   DVT prophylaxis: lovenox Code Status: full  Family Communication:  Disposition Plan: from home, needs IV fluids to correct electrolyte abnormalities   Consultants:    Procedures:    Antimicrobials:     Subjective: Pt reports that he normally is allowed comfort oral feeds and requests ice cup.   Objective: Vitals:   01/27/20 2100 01/27/20 2219 01/27/20 2250 01/28/20 1335  BP: 112/78 110/80 104/75 113/71  Pulse:  91 90 79  Resp:  16 19 18   Temp:  97.6 F (36.4 C) 97.8 F (36.6 C) (!) 97.5 F (36.4 C)  TempSrc:   Oral   SpO2:  97% 100% 97%  Weight:   87.8 kg   Height:   5\' 6"  (1.676 m)     Intake/Output Summary (Last 24 hours) at 01/28/2020 1555 Last data filed at  01/28/2020 1504 Gross per 24 hour  Intake 1657.28 ml  Output 6000 ml  Net -4342.72 ml   Filed Weights   01/27/20 1819 01/27/20 2250  Weight: 43.1 kg 87.8 kg     REVIEW OF SYSTEMS  As per history otherwise all reviewed and reported negative  Exam:  General exam: emaciated chronically ill appearing male, NAD.  Dry mucus membranes.  Respiratory system:  No increased work of breathing. Split ostomy right upper chest wall.  Cardiovascular system: S1 & S2 heard. No JVD, murmurs, gallops, clicks or pedal edema. Gastrointestinal system: GJ tube in place. Abdomen is nondistended, soft and nontender. Normal bowel sounds heard. Central nervous system: Alert and oriented. No focal neurological deficits. Extremities: no CCE.  Data Reviewed: Basic Metabolic Panel: Recent Labs  Lab 01/27/20 1913 01/28/20 0230 01/28/20 0840 01/28/20 1222  NA 162* 162* 158* 154*  K 4.7 3.6 3.5 3.9  CL 112* 117* 112* 111  CO2 38* 32 37* 35*  GLUCOSE 108* 99 105* 142*  BUN 72* 67* 61* 56*  CREATININE 1.52* 1.37* 1.20 1.17  CALCIUM 11.4* 10.1 10.3 10.4*   Liver Function Tests: Recent Labs  Lab 01/27/20 1913  AST 28  ALT 33  ALKPHOS 124  BILITOT 0.5  PROT 8.5*  ALBUMIN 5.1*   Recent Labs  Lab 01/27/20 1913  LIPASE 33   No results for input(s): AMMONIA in the last 168 hours. CBC: Recent Labs  Lab 01/27/20 1913 01/28/20 0840  WBC 10.9* 10.0  NEUTROABS 8.3*  --   HGB 16.8 14.3  HCT 58.0* 50.8  MCV 94.9 97.5  PLT 303 235   Cardiac Enzymes: No results for input(s): CKTOTAL, CKMB, CKMBINDEX, TROPONINI in the last 168 hours. CBG (last 3)  No results for input(s): GLUCAP in the last 72 hours. Recent Results (from the past 240 hour(s))  Respiratory Panel by RT PCR (Flu A&B, Covid) - Nasopharyngeal Swab     Status: None   Collection Time: 01/27/20  8:55 PM   Specimen: Nasopharyngeal Swab  Result Value Ref Range Status   SARS Coronavirus 2 by RT PCR NEGATIVE NEGATIVE Final    Comment:  (NOTE) SARS-CoV-2 target nucleic acids are NOT DETECTED. The SARS-CoV-2 RNA is generally detectable in upper respiratoy specimens during the acute phase of infection. The lowest concentration of SARS-CoV-2 viral copies this assay can detect is 131 copies/mL. A negative result does not preclude SARS-Cov-2 infection and should not be used as the sole basis for treatment or other patient management decisions. A negative result may occur with  improper specimen collection/handling, submission of specimen other than nasopharyngeal swab, presence of viral mutation(s) within the areas targeted by this assay, and inadequate number of viral copies (<131 copies/mL). A negative result must be combined with clinical observations, patient history, and epidemiological information. The expected result is Negative. Fact Sheet for Patients:  PinkCheek.be Fact Sheet for Healthcare Providers:  GravelBags.it This test is not yet ap proved or cleared by the [place] FDA and  has been authorized for detection and/or diagnosis of SARS-CoV-2 by FDA under an Emergency Use Authorization (EUA). This EUA will remain  in effect (meaning this test can be used) for the duration of the COVID-19 declaration under Section 564(b)(1) of the Act, 21 U.S.C. section 360bbb-3(b)(1), unless the authorization is terminated or revoked sooner.    Influenza A by PCR NEGATIVE NEGATIVE Final   Influenza B by PCR NEGATIVE NEGATIVE Final    Comment: (NOTE) The Xpert Xpress SARS-CoV-2/FLU/RSV assay is intended as an aid in  the diagnosis of influenza from Nasopharyngeal swab specimens and  should not be used as a sole basis for treatment. Nasal washings and  aspirates are unacceptable for Xpert Xpress SARS-CoV-2/FLU/RSV  testing. Fact Sheet for Patients: PinkCheek.be Fact Sheet for Healthcare  Providers: GravelBags.it This test is not yet approved or cleared by the [place] FDA and  has been authorized for detection and/or diagnosis of SARS-CoV-2 by  FDA under an Emergency Use Authorization (EUA). This EUA will remain  in effect (meaning this test can be used) for the duration of the  Covid-19 declaration under Section 564(b)(1) of the Act, 21  U.S.C. section 360bbb-3(b)(1), unless the authorization is  terminated or revoked. Performed at [facility], [address] [place], [place] [address]      Studies: No results found.   Scheduled Meds: . chlorhexidine  15 mL Mouth Rinse BID  . enoxaparin (LOVENOX) injection  40 mg Subcutaneous QHS  . free water  300 mL Per Tube Q4H  . gabapentin  100 mg Oral BID  . mouth rinse  15 mL Mouth Rinse q12n4p  . traZODone  50 mg Per Tube QHS   Continuous Infusions: . dextrose 150 mL/hr at 01/28/20 0832  . feeding supplement (JEVITY 1.2 CAL) 1,000 mL (01/28/20 1001)    Principal Problem:   Hypernatremia Active Problems:   Esophageal atresia   Gastrostomy tube in place [facility])  Time spent:   Irwin Brakeman, MD [facility] 01/28/2020, 3:55 PM    LOS: 1 day  How to contact the [facility] Attending or Consulting provider [place] or covering provider during after hours [place], for this patient?  1. Check the care team in [facility] and look for a) attending/consulting TRH provider listed and b) the [facility] team listed 2. Log into [email or web address] and use [place]'s universal password to access. If you do not have the password, please contact the hospital operator. 3. Locate the [facility] provider you are looking for under [facility] and page to a number that you can be directly reached. 4. If you still have difficulty reaching the provider, please page the [facility] (Director on Call) for the Hospitalists listed on amion for assistance.

## 2020-01-28 NOTE — Progress Notes (Signed)
Tech attempted to image patient at 11p on 2/20 but patient refused stating that "he could not tolerate the hardness of the imagng plate."  Tech notified patient's nurse and Dr. Jenetta Downer of patient's refusal.

## 2020-01-29 LAB — BASIC METABOLIC PANEL
Anion gap: 8 (ref 5–15)
BUN: 32 mg/dL — ABNORMAL HIGH (ref 6–20)
CO2: 35 mmol/L — ABNORMAL HIGH (ref 22–32)
Calcium: 9.4 mg/dL (ref 8.9–10.3)
Chloride: 100 mmol/L (ref 98–111)
Creatinine, Ser: 0.93 mg/dL (ref 0.61–1.24)
GFR calc Af Amer: >60 mL/min (ref >60)
GFR calc non Af Amer: >60 mL/min (ref >60)
Glucose, Bld: 100 mg/dL — ABNORMAL HIGH (ref 70–99)
Potassium: 3.7 mmol/L (ref 3.5–5.1)
Sodium: 143 mmol/L (ref 135–145)

## 2020-01-29 LAB — MAGNESIUM: Magnesium: 2.3 mg/dL (ref 1.7–2.4)

## 2020-01-29 LAB — ALBUMIN: Albumin: 3.4 g/dL — ABNORMAL LOW (ref 3.5–5.0)

## 2020-01-29 NOTE — Care Management (Signed)
CM placed call to [employer] who supplies patient's enteral supplies about missing piece of Peg tube to see if they can provide to patient. Juliann Pulse of [employer] to verify.

## 2020-01-29 NOTE — Progress Notes (Signed)
Patient requesting home med, Terazosin. BP 94/60 taken manually. MD notified. No new orders.

## 2020-01-29 NOTE — Progress Notes (Signed)
Initial Nutrition Assessment  DOCUMENTATION CODES:   Underweight  INTERVENTION:  -Jevity 1.2 @ 65 ml/hr via J-tube   -Free water flushes 300 ml q 4 hrs- per MD  -Recommend he resume home regimen once cleared for discharge. Noted below. Talked with him about the importance of compliance with tube feeding including free water daily.    NUTRITION DIAGNOSIS:  Inadequate oral intake related to inability to eat as evidenced by (esphogeal atresia). GJ- TUBE dependent for 100% of nutrition.  GOAL:  Patient will meet greater than or equal to 90% of their needs   MONITOR:   Weight trends, TF tolerance, Skin, I & O's, Labs REASON FOR ASSESSMENT:   Malnutrition Screening Tool    ASSESSMENT: Patient is a 49 yo male with hx of esophageal atresia s/p retrosternal colon interposition, multiple surgeries including esophageal resection and creation of split fistula 7//2020 and high output GJ tube. Hypernatremia on admission sodium- 162 (H).   Patient reports home tube feeding- 5 cans of TWOCAL per night. Provides 2370 kcal, 99.5 gr protein which is meets 100% est needs. He has a girlfriend who he says is legally blind but has been helping him.  Patient gives himself 2 syringes of free water before and after tube feeding. Suspect he is not getting sufficient free water through [place] tube based on his report. His G-tube has a missing piece per nursing but he is able to receive his tube feeding. Tolerating at goal range.   Presently patient is receiving Jevity 1.2 @ 23ml/hr -provides 1872 kcl, 87 gr protein and 1259 ml water. Meets 100% of estimated needs.   Clear liquid diet for comfort - he is constantly drinking during RD visit. Pre chart review his Drain-OP-11,000 ml since admission.   His mother is at bedside but not directly involved in his home care.   Medications reviewed.   Labs reviewed: Sodium has improved to normal range -143. BUN 35, Cr. WNL.  NUTRITION - FOCUSED PHYSICAL  EXAM: Unable to perform today. On 2/11 pt had severe depletion of fat mass upper arms, orbital buccal regions and severe temporal clavicle muscle loss.   Diet Order:   Diet Order            Diet clear liquid Room service appropriate? Yes; Fluid consistency: Thin  Diet effective now              EDUCATION NEEDS:  Education needs have been addressed Skin:  Skin Assessment: Reviewed RN Assessment Other: Stoma to right side of throat and PEJ tube.  Last BM:   unknown  Height:   Ht Readings from Last 1 Encounters:  01/27/20 5\' 6"  (1.676 m)   Weight:   Wt Readings from Last 1 Encounters:  01/27/20 87.8 kg  Patient reports his weight has been in 85-89 lbs recently. He is underweight. Weight-45 kg recent admission 01/18/20.   Ideal Body Weight:   65 kg  BMI:  Body mass index is 31.24 kg/m.  Estimated Nutritional Needs:   Kcal:  1800-2000  Protein:  81-90 gr  Fluid:  1.8 liters daily   Colman Cater Clinical Nutrition Contact: [phone]

## 2020-01-29 NOTE — Progress Notes (Signed)
PROGRESS NOTE [place] [facility]   Thomas [name]  [id number]  DOB: Oct 18, 1971  DOA: 01/27/2020 PCP: [name], NP   Brief Admission Hx: 49 y.o. male with medical history significant for complicated surgical history of esophageal atresia, multiple surgeries s/p retrosternal colonic interposition, recent partial esophageal resection and creation of split fistula and high output GJ tube.  Also multiple recent admissions 1/11- 1/15 and then 1/23-1/29 at [facility], 2/6 - 01/18/20 for hypernatremia.  He was admitted with recurrent hypernatremia.    MDM/Assessment & Plan:   1. Hypernatremia - recurrent issue for this patient and concerns about compliance with free water.  I have ordered D5W at 150 cc/o and will follow sodium until it is improved. His sodium is now trending down. 2. AKI - prerenal - treating with IV fluids.  3. Esophageal atresia - he is s/p multiple surgeries s/p retrosternal colonic interposition, recent partial esophageal resection and creation of split fistula and high output GJ tube.  He reports that he follows up with surgery at [facility].   4. 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 Will ask case management to assist with finding a new outer piece for his PEG.  I asked the social worker to contact [place] who provides the enteral supplies for this patient.  DVT prophylaxis: lovenox Code Status: full  Family Communication: I spoke with his mother who came to visit him at bedside today.  She expressed concerns about the care patient receives at home with girlfriend who is also chronically ill and unable to provide proper care for him Disposition Plan: from home, needs IV fluids to correct electrolyte abnormalities, mother willing to take patient to her home and provide care for him if patient is willing to go  Consultants:    Procedures:    Antimicrobials:     Subjective: Pt without complaints  today.   Objective: Vitals:   01/28/20 2226 01/28/20 2232 01/29/20 0521 01/29/20 0525  BP: (!) 86/61 (!) 92/58 (!) 87/62 94/60  Pulse: 65  (!) 55   Resp: 20  18   Temp: 97.8 F (36.6 C)  98.1 F (36.7 C)   TempSrc: Oral  Oral   SpO2: 100%  99%   Weight:      Height:        Intake/Output Summary (Last 24 hours) at 01/29/2020 1636 Last data filed at 01/29/2020 [id number] Gross per 24 hour  Intake 3273.52 ml  Output 6300 ml  Net -3026.48 ml   Filed Weights   01/27/20 1819 01/27/20 2250  Weight: 43.1 kg 87.8 kg   REVIEW OF SYSTEMS  As per history otherwise all reviewed and reported negative  Exam:  General exam: emaciated chronically ill appearing male, NAD.  Slightly less dry mucus membranes.  Respiratory system:  No increased work of breathing. Split ostomy right upper chest wall.  Cardiovascular system: S1 & S2 heard. No JVD, murmurs, gallops, clicks or pedal edema. Gastrointestinal system: GJ tube in place. Abdomen is nondistended, soft and nontender. Normal bowel sounds heard. Central nervous system: Alert and oriented. No focal neurological deficits. Extremities: no CCE.  Data Reviewed: Basic Metabolic Panel: Recent Labs  Lab 01/27/20 1913 01/28/20 0230 01/28/20 0840 01/28/20 1222 01/29/20 0932  NA 162* 162* 158* 154* 143  K 4.7 3.6 3.5 3.9 3.7  CL 112* 117* 112* 111 100  CO2 38* 32 37* 35* 35*  GLUCOSE 108* 99 105* 142* 100*  BUN  72* 67* 61* 56* 32*  CREATININE 1.52* 1.37* 1.20 1.17 0.93  CALCIUM 11.4* 10.1 10.3 10.4* 9.4  MG  --   --   --   --  2.3   Liver Function Tests: Recent Labs  Lab 01/27/20 1913 01/29/20 0932  AST 28  --   ALT 33  --   ALKPHOS 124  --   BILITOT 0.5  --   PROT 8.5*  --   ALBUMIN 5.1* 3.4*   Recent Labs  Lab 01/27/20 1913  LIPASE 33   No results for input(s): AMMONIA in the last 168 hours. CBC: Recent Labs  Lab 01/27/20 1913 01/28/20 0840  WBC 10.9* 10.0  NEUTROABS 8.3*  --   HGB 16.8 14.3  HCT 58.0* 50.8  MCV  94.9 97.5  PLT 303 235   Cardiac Enzymes: No results for input(s): CKTOTAL, CKMB, CKMBINDEX, TROPONINI in the last 168 hours. CBG (last 3)  No results for input(s): GLUCAP in the last 72 hours. Recent Results (from the past 240 hour(s))  Respiratory Panel by RT PCR (Flu A&B, Covid) - Nasopharyngeal Swab     Status: None   Collection Time: 01/27/20  8:55 PM   Specimen: Nasopharyngeal Swab  Result Value Ref Range Status   SARS Coronavirus 2 by RT PCR NEGATIVE NEGATIVE Final    Comment: (NOTE) SARS-CoV-2 target nucleic acids are NOT DETECTED. The SARS-CoV-2 RNA is generally detectable in upper respiratoy specimens during the acute phase of infection. The lowest concentration of SARS-CoV-2 viral copies this assay can detect is 131 copies/mL. A negative result does not preclude SARS-Cov-2 infection and should not be used as the sole basis for treatment or other patient management decisions. A negative result may occur with  improper specimen collection/handling, submission of specimen other than nasopharyngeal swab, presence of viral mutation(s) within the areas targeted by this assay, and inadequate number of viral copies (<131 copies/mL). A negative result must be combined with clinical observations, patient history, and epidemiological information. The expected result is Negative. Fact Sheet for Patients:  PinkCheek.be Fact Sheet for Healthcare Providers:  GravelBags.it This test is not yet ap proved or cleared by the [place] FDA and  has been authorized for detection and/or diagnosis of SARS-CoV-2 by FDA under an Emergency Use Authorization (EUA). This EUA will remain  in effect (meaning this test can be used) for the duration of the COVID-19 declaration under Section 564(b)(1) of the Act, 21 U.S.C. section 360bbb-3(b)(1), unless the authorization is terminated or revoked sooner.    Influenza A by PCR NEGATIVE  NEGATIVE Final   Influenza B by PCR NEGATIVE NEGATIVE Final    Comment: (NOTE) The Xpert Xpress SARS-CoV-2/FLU/RSV assay is intended as an aid in  the diagnosis of influenza from Nasopharyngeal swab specimens and  should not be used as a sole basis for treatment. Nasal washings and  aspirates are unacceptable for Xpert Xpress SARS-CoV-2/FLU/RSV  testing. Fact Sheet for Patients: PinkCheek.be Fact Sheet for Healthcare Providers: GravelBags.it This test is not yet approved or cleared by the [place] FDA and  has been authorized for detection and/or diagnosis of SARS-CoV-2 by  FDA under an Emergency Use Authorization (EUA). This EUA will remain  in effect (meaning this test can be used) for the duration of the  Covid-19 declaration under Section 564(b)(1) of the Act, 21  U.S.C. section 360bbb-3(b)(1), unless the authorization is  terminated or revoked. Performed at [facility], [address], [place], [place] [address]  Studies: No results found.  Scheduled Meds: . chlorhexidine  15 mL Mouth Rinse BID  . enoxaparin (LOVENOX) injection  40 mg Subcutaneous QHS  . free water  300 mL Per Tube Q4H  . gabapentin  100 mg Oral BID  . mouth rinse  15 mL Mouth Rinse q12n4p  . traZODone  50 mg Per Tube QHS   Continuous Infusions: . dextrose 150 mL/hr at 01/28/20 2343  . feeding supplement (JEVITY 1.2 CAL) 1,000 mL (01/28/20 2345)    Principal Problem:   Hypernatremia Active Problems:   Esophageal atresia   Gastrostomy tube in place [facility])  Time spent:   Irwin Brakeman, MD [facility] 01/29/2020, 4:36 PM    LOS: 2 days  How to contact the [facility] Attending or Consulting provider [place] or covering provider during after hours [place], for this patient?  1. Check the care team in [facility] and look for a) attending/consulting TRH provider listed and b) the [facility] team listed 2. Log into [email or web address] and use [facility]'s universal password to access. If you do not have the password, please contact the hospital operator. 3. Locate the [facility] provider you are looking for under [facility] and page to a number that you can be directly reached. 4. If you still have difficulty reaching the provider, please page the [facility] (Director on Call) for the Hospitalists listed on amion for assistance.

## 2020-01-30 LAB — BASIC METABOLIC PANEL
Anion gap: 9 (ref 5–15)
BUN: 17 mg/dL (ref 6–20)
CO2: 35 mmol/L — ABNORMAL HIGH (ref 22–32)
Calcium: 9.5 mg/dL (ref 8.9–10.3)
Chloride: 102 mmol/L (ref 98–111)
Creatinine, Ser: 0.73 mg/dL (ref 0.61–1.24)
GFR calc Af Amer: >60 mL/min (ref >60)
GFR calc non Af Amer: >60 mL/min (ref >60)
Glucose, Bld: 116 mg/dL — ABNORMAL HIGH (ref 70–99)
Potassium: 2.9 mmol/L — ABNORMAL LOW (ref 3.5–5.1)
Sodium: 146 mmol/L — ABNORMAL HIGH (ref 135–145)

## 2020-01-30 MED ORDER — POTASSIUM CHLORIDE 10 MEQ/100ML IV SOLN
10.0000 meq | INTRAVENOUS | Status: AC
  Administered 2020-01-30 (×5): 10 meq via INTRAVENOUS
  Filled 2020-01-30: qty 100

## 2020-01-30 MED ORDER — OXYCODONE HCL 5 MG PO TABS
5.0000 mg | ORAL_TABLET | Freq: Four times a day (QID) | ORAL | Status: DC | PRN
  Administered 2020-01-30 – 2020-02-01 (×9): 5 mg
  Filled 2020-01-30 (×9): qty 1

## 2020-01-30 MED ORDER — POTASSIUM CHLORIDE 10 MEQ/100ML IV SOLN: 10.0000 meq | INTRAVENOUS | Status: DC

## 2020-01-30 NOTE — TOC Progression Note (Signed)
TOC following. Spoke with Juliann Pulse at [employer] regarding the missing piece of pt's PEG tube. Per Juliann Pulse, she has still not gotten an answer from her supervisors if this is something they carry and can provide. Discussed with pt's RN who states that it appears pt's PEG tube is leaking from under the button. She thinks replacing the valve that attaches to the button is needed but that is not going to solve the other leaking issue.   Discussed with Dr. Wynetta Emery who states that pt really needs to follow up with [facility] Surgeon who placed the Gtube to address these issues. If we are able to assist in getting the valve prior to dc, we will do it, but if not, Dr. Wynetta Emery states pt will need to follow up with [facility] for this as well. Dr. Wynetta Emery states that he informed pt and his mother of this today.  Anticipating pt may be ready for dc tomorrow. Pt's mother has voiced that if pt will agree, she would like him to come to her house at dc. Pt is active with [facility] for RN and PT so he will need new orders at dc and if pt goes home with his mother, will need to update Vaughan Basta from [facility] on the address pt will be at.  Assigned TOC will continue to follow.

## 2020-01-30 NOTE — Progress Notes (Addendum)
MEWS scored changed from green to yellow after measuring low BP 80/55.  Patient is asymptomatic.  Provider Wynetta Emery and charge nurse, Caryl Pina were notified.  Provider suggested taking manual BP. Will take BP q2hrsx2  Manual BP was taken and BP was 92/58.  Will take manual BP again @ 1530 and 1730.  Patient remains asymptomatic.  Systolic BP remains in [id number].  Diastolic BP remians in mid 30s.  Provider notified.    Patient is stable and asymptomatic.  q4hr BPs will not be obtained.

## 2020-01-30 NOTE — Progress Notes (Signed)
MD notified of manual bp 95/52 with no new orders.

## 2020-01-30 NOTE — Plan of Care (Signed)

## 2020-01-30 NOTE — Progress Notes (Signed)
PROGRESS NOTE  CAMPUS   Thomas [name]  [id number]  DOB: 1971/12/01  DOA: 01/27/2020 PCP: [name], NP   Brief Admission Hx: 49 y.o. male with medical history significant for complicated surgical history of esophageal atresia, multiple surgeries s/p retrosternal colonic interposition, recent partial esophageal resection and creation of split fistula and high output GJ tube.  Also multiple recent admissions 1/11- 1/15 and then 1/23-1/29 at [facility], 2/6 - 01/18/20 for hypernatremia.  He was admitted with recurrent hypernatremia.    MDM/Assessment & Plan:   1. Hypernatremia - slowly improving with free water, this is a recurrent issue for this patient and concerns about compliance with free water at home.  Pt's mother reported that she does not believe that he has enough support at home due to his girlfriend being sick with many medical conditions.  Mother wants patient to return to live with her as she can care for him properly.  I strongly advised patient to consider doing this at discharge.  Continue D5W and follow sodium until it is improved. . 2. AKI - prerenal - improving with IV fluids.  3. Esophageal atresia - he is s/p multiple surgeries s/p retrosternal colonic interposition, recent partial esophageal resection and creation of split fistula and high output GJ tube.  He reports that he follows up with surgery at [facility] and I have strongly advised him to do so especially since he is having trouble with his PEG.     4. 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 I conferenced with case management to assist with finding a new outer piece for his PEG.  We are having difficulty finding the specific part for his particular PEG.  I told him and his mother that he needs to follow up with his providers at [facility] who performed his complex surgeries to assist him with his supplies and to change out his PEG if needed. At this time,  the PEG is working but there is a small amount of leakage and the outside piece needs to be replaced.  5. Low BP - I believe that patient's BP is low chronically as he is totally asymptomatic from this, I also advised staff to do manual blood pressure measurement in this patient as the machine BP readings are not consistently accurate and reliable.   DVT prophylaxis: lovenox Code Status: full  Family Communication: I spoke with his mother who came to visit him at bedside.  She expressed concerns about the care patient receives at home with girlfriend who is also chronically ill and unable to provide proper care for him.  Mother would like patient to return to her home so she can care for him, patient is undecided at this time.  Disposition Plan: from home, needs IV fluids to correct electrolyte abnormalities, mother willing to take patient to her home and provide care for him if patient is willing to go  Consultants:    Procedures:    Antimicrobials:     Subjective: Pt reports that he normally takes his pain meds every 6 hours versus every 8 hours and would like it changed.     Objective: Vitals:   01/30/20 1036 01/30/20 1332 01/30/20 1333 01/30/20 1442  BP:  (!) 80/55  (!) 92/58  Pulse: 66 (!) 40 62   Resp:  20    Temp:  (!) 97.5 F (36.4 C)    TempSrc:  Oral    SpO2: 100% Marland Kitchen)  82%    Weight:      Height:        Intake/Output Summary (Last 24 hours) at 01/30/2020 1514 Last data filed at 01/30/2020 1300 Gross per 24 hour  Intake 600 ml  Output 6225 ml  Net -5625 ml   Filed Weights   01/27/20 1819 01/27/20 2250  Weight: 43.1 kg 87.8 kg   REVIEW OF SYSTEMS  As per history otherwise all reviewed and reported negative  Exam:  General exam: emaciated chronically ill appearing male, NAD.  Slightly less dry mucus membranes.  Respiratory system:  No increased work of breathing. Split ostomy right upper chest wall.  Cardiovascular system: S1 & S2 heard. No JVD, murmurs,  gallops, clicks or pedal edema. Gastrointestinal system: GJ tube in place, small amount of leakage from tube.  Abdomen is nondistended, soft and nontender. Normal bowel sounds heard. Central nervous system: Alert and oriented. No focal neurological deficits. Extremities: no CCE.  Data Reviewed: Basic Metabolic Panel: Recent Labs  Lab 01/28/20 0230 01/28/20 0840 01/28/20 1222 01/29/20 0932 01/30/20 0450  NA 162* 158* 154* 143 146*  K 3.6 3.5 3.9 3.7 2.9*  CL 117* 112* 111 100 102  CO2 32 37* 35* 35* 35*  GLUCOSE 99 105* 142* 100* 116*  BUN 67* 61* 56* 32* 17  CREATININE 1.37* 1.20 1.17 0.93 0.73  CALCIUM 10.1 10.3 10.4* 9.4 9.5  MG  --   --   --  2.3  --    Liver Function Tests: Recent Labs  Lab 01/27/20 1913 01/29/20 0932  AST 28  --   ALT 33  --   ALKPHOS 124  --   BILITOT 0.5  --   PROT 8.5*  --   ALBUMIN 5.1* 3.4*   Recent Labs  Lab 01/27/20 1913  LIPASE 33   No results for input(s): AMMONIA in the last 168 hours. CBC: Recent Labs  Lab 01/27/20 1913 01/28/20 0840  WBC 10.9* 10.0  NEUTROABS 8.3*  --   HGB 16.8 14.3  HCT 58.0* 50.8  MCV 94.9 97.5  PLT 303 235   Cardiac Enzymes: No results for input(s): CKTOTAL, CKMB, CKMBINDEX, TROPONINI in the last 168 hours. CBG (last 3)  No results for input(s): GLUCAP in the last 72 hours. Recent Results (from the past 240 hour(s))  Respiratory Panel by RT PCR (Flu A&B, Covid) - Nasopharyngeal Swab     Status: None   Collection Time: 01/27/20  8:55 PM   Specimen: Nasopharyngeal Swab  Result Value Ref Range Status   SARS Coronavirus 2 by RT PCR NEGATIVE NEGATIVE Final    Comment: (NOTE) SARS-CoV-2 target nucleic acids are NOT DETECTED. The SARS-CoV-2 RNA is generally detectable in upper respiratoy specimens during the acute phase of infection. The lowest concentration of SARS-CoV-2 viral copies this assay can detect is 131 copies/mL. A negative result does not preclude SARS-Cov-2 infection and should not be  used as the sole basis for treatment or other patient management decisions. A negative result may occur with  improper specimen collection/handling, submission of specimen other than nasopharyngeal swab, presence of viral mutation(s) within the areas targeted by this assay, and inadequate number of viral copies (<131 copies/mL). A negative result must be combined with clinical observations, patient history, and epidemiological information. The expected result is Negative. Fact Sheet for Patients:  PinkCheek.be Fact Sheet for Healthcare Providers:  GravelBags.it This test is not yet ap proved or cleared by the [place] and  has been authorized for  detection and/or diagnosis of SARS-CoV-2 by FDA under an Emergency Use Authorization (EUA). This EUA will remain  in effect (meaning this test can be used) for the duration of the COVID-19 declaration under Section 564(b)(1) of the Act, 21 U.S.C. section 360bbb-3(b)(1), unless the authorization is terminated or revoked sooner.    Influenza A by PCR NEGATIVE NEGATIVE Final   Influenza B by PCR NEGATIVE NEGATIVE Final    Comment: (NOTE) The Xpert Xpress SARS-CoV-2/FLU/RSV assay is intended as an aid in  the diagnosis of influenza from Nasopharyngeal swab specimens and  should not be used as a sole basis for treatment. Nasal washings and  aspirates are unacceptable for Xpert Xpress SARS-CoV-2/FLU/RSV  testing. Fact Sheet for Patients: PinkCheek.be Fact Sheet for Healthcare Providers: GravelBags.it This test is not yet approved or cleared by the [place] FDA and  has been authorized for detection and/or diagnosis of SARS-CoV-2 by  FDA under an Emergency Use Authorization (EUA). This EUA will remain  in effect (meaning this test can be used) for the duration of the  Covid-19 declaration under Section 564(b)(1) of the  Act, 21  U.S.C. section 360bbb-3(b)(1), unless the authorization is  terminated or revoked. Performed at [facility], [address] [place], [place] [address]     Studies: No results found.  Scheduled Meds:  chlorhexidine  15 mL Mouth Rinse BID   enoxaparin (LOVENOX) injection  40 mg Subcutaneous QHS   free water  300 mL Per Tube Q4H   gabapentin  100 mg Oral BID   mouth rinse  15 mL Mouth Rinse q12n4p   traZODone  50 mg Per Tube QHS   Continuous Infusions:  dextrose 160 mL/hr at 01/30/20 [id number]   feeding supplement (JEVITY 1.2 CAL) 1,000 mL (01/30/20 0441)    Principal Problem:   Hypernatremia Active Problems:   Esophageal atresia   Gastrostomy tube in place [facility])  Time spent:   Irwin Brakeman, MD [facility] 01/30/2020, 3:14 PM    LOS: 3 days  How to contact the [facility] Attending or Consulting provider [place] or covering provider during after hours [place], for this patient?  1. Check the care team in [facility] and look for a) attending/consulting TR[facility] provider listed and b) the [facility] team listed 2. Log into [email or web address] and use [place]'s universal password to access. If you do not have the password, please contact the hospital operator. 3. Locate the [facility] provider you are looking for under [facility] and page to a number that you can be directly reached. 4. If you still have difficulty reaching the provider, please page the [facility] (Director on Call) for the Hospitalists listed on amion for assistance.

## 2020-01-31 LAB — BASIC METABOLIC PANEL
Anion gap: 7 (ref 5–15)
BUN: 18 mg/dL (ref 6–20)
CO2: 32 mmol/L (ref 22–32)
Calcium: 9.7 mg/dL (ref 8.9–10.3)
Chloride: 107 mmol/L (ref 98–111)
Creatinine, Ser: 0.67 mg/dL (ref 0.61–1.24)
GFR calc Af Amer: >60 mL/min (ref >60)
GFR calc non Af Amer: >60 mL/min (ref >60)
Glucose, Bld: 115 mg/dL — ABNORMAL HIGH (ref 70–99)
Potassium: 3.9 mmol/L (ref 3.5–5.1)
Sodium: 146 mmol/L — ABNORMAL HIGH (ref 135–145)

## 2020-01-31 LAB — MAGNESIUM: Magnesium: 2.5 mg/dL — ABNORMAL HIGH (ref 1.7–2.4)

## 2020-01-31 LAB — GLUCOSE, CAPILLARY
Glucose-Capillary: 112 mg/dL — ABNORMAL HIGH (ref 70–99)
Glucose-Capillary: 122 mg/dL — ABNORMAL HIGH (ref 70–99)

## 2020-01-31 NOTE — Progress Notes (Signed)
PROGRESS NOTE    Thomas [name]  [id number] DOB: [birth year] DOA: 01/27/2020 PCP: [name], NP   Brief Narrative:  49 y.o.malewith medical history significant forcomplicated surgical history of esophageal atresia, multiple surgeries s/pretrosternal colonic interposition,recent partial esophageal resection and creation of split fistula and high output GJtube. Also multiple recent admissions1/11- 1/15and then 1/23-1/29at [facility], 2/6 - 2/11/21for hypernatremia.  He was admitted with recurrent hypernatremia.   2/24: Sodium levels have improved and patient is stable for discharge, however he continues to have high output and requires high rates of IV fluid to maintain.  He does not have an adequate source of follow-up at [facility] and this needs to be arranged for his PEG tube functioning to be corrected prior to discharge.  Assessment & Plan:   Principal Problem:   Hypernatremia Active Problems:   Esophageal atresia   Gastrostomy tube in place ([place])   1. Hypernatremia - slowly improving with free water, this is a recurrent issue for this patient and concerns about compliance with free water at home.  Pt's mother reported that she does not believe that he has enough support at home due to his girlfriend being sick with many medical conditions.  Mother wants patient to return to live with her as she can care for him properly.  I strongly advised patient to consider doing this at discharge.  Continue D5W and follow sodium until it is improved. . 2. AKI - prerenal - improving with IV fluids.  3. Esophageal atresia - he is s/p multiple surgeries s/p retrosternal colonic interposition, recent partial esophageal resection and creation of split fistula and high output GJ tube.  He reports that he follows up with surgery at [facility] and I have strongly advised him to do so especially since he is having trouble with his PEG.     4. PEG tube dysfunction - he has a button type PEG  but needs a replacement connection. I conferenced with case management to assist with finding a new outer piece for his PEG.  We are having difficulty finding the specific part for his particular PEG.  I told him and his mother that he needs to follow up with his providers at [facility] who performed his complex surgeries to assist him with his supplies and to change out his PEG if needed. At this time, the PEG is working but there is a small amount of leakage and the outside piece needs to be replaced.  5. Low BP - I believe that patient's BP is low chronically as he is totally asymptomatic from this, I also advised staff to do manual blood pressure measurement in this patient as the machine BP readings are not consistently accurate and reliable.    DVT prophylaxis: Lovenox Code Status: Full Family Communication: None at bedside Disposition Plan: Need to see if patient is willing to go stay with his mother on discharge as well as organize follow-up for his PEG tube prior to discharge.   Consultants:   None  Procedures:   See below  Antimicrobials:  Anti-infectives (From admission, onward)   None       Subjective: Patient seen and evaluated today with no new acute complaints or concerns. No acute concerns or events noted overnight.  Objective: Vitals:   01/30/20 1527 01/30/20 1725 01/30/20 2124 01/31/20 0448  BP: (!) 96/54 (!) 94/54 (!) 90/52 (!) 82/53  Pulse:   66   Resp:   18 16  Temp:   (!) 97.5 F (  36.4 C) (!) 97.4 F (36.3 C)  TempSrc:   Oral Oral  SpO2:   90% 97%  Weight:      Height:        Intake/Output Summary (Last 24 hours) at 01/31/2020 1511 Last data filed at 01/31/2020 1300 Gross per 24 hour  Intake 3471.54 ml  Output 11950 ml  Net -8478.46 ml   Filed Weights   01/27/20 1819 01/27/20 2250  Weight: 43.1 kg 87.8 kg    Examination:  General exam: Appears calm and comfortable, emaciated Respiratory system: Clear to auscultation. Respiratory effort  normal.  Currently with 3 L nasal cannula.  Splint ostomy right upper chest wall Cardiovascular system: S1 & S2 heard, RRR. No JVD, murmurs, rubs, gallops or clicks. No pedal edema. Gastrointestinal system: Abdomen is nondistended, soft and nontender. No organomegaly or masses felt. Normal bowel sounds heard.  GJ tube present with small amount of leakage Central nervous system: Alert and oriented. No focal neurological deficits. Extremities: No significant edema Skin: No rashes, lesions or ulcers Psychiatry: Judgement and insight appear normal. Mood & affect appropriate.     Data Reviewed: I have personally reviewed following labs and imaging studies  CBC: Recent Labs  Lab 01/27/20 1913 01/28/20 0840  WBC 10.9* 10.0  NEUTROABS 8.3*  --   HGB 16.8 14.3  HCT 58.0* 50.8  MCV 94.9 97.5  PLT 303 [id number]   Basic Metabolic Panel: Recent Labs  Lab 01/28/20 0840 01/28/20 1222 01/29/20 0932 01/30/20 0450 01/31/20 0457  NA 158* 154* 143 146* 146*  K 3.5 3.9 3.7 2.9* 3.9  CL 112* 111 100 102 107  CO2 37* 35* 35* 35* 32  GLUCOSE 105* 142* 100* 116* 115*  BUN 61* 56* 32* 17 18  CREATININE 1.20 1.17 0.93 0.73 0.67  CALCIUM 10.3 10.4* 9.4 9.5 9.7  MG  --   --  2.3  --  2.5*   GFR: Estimated Creatinine Clearance: 117.2 mL/min (by C-G formula based on SCr of 0.67 mg/dL). Liver Function Tests: Recent Labs  Lab 01/27/20 1913 01/29/20 0932  AST 28  --   ALT 33  --   ALKPHOS 124  --   BILITOT 0.5  --   PROT 8.5*  --   ALBUMIN 5.1* 3.4*   Recent Labs  Lab 01/27/20 1913  LIPASE 33   No results for input(s): AMMONIA in the last 168 hours. Coagulation Profile: No results for input(s): INR, PROTIME in the last 168 hours. Cardiac Enzymes: No results for input(s): CKTOTAL, CKMB, CKMBINDEX, TROPONINI in the last 168 hours. BNP (last 3 results) No results for input(s): PROBNP in the last 8760 hours. HbA1C: No results for input(s): HGBA1C in the last 72 hours. CBG: Recent Labs    Lab 01/31/20 0308 01/31/20 0731  GLUCAP 112* 122*   Lipid Profile: No results for input(s): CHOL, HDL, LDLCALC, TRIG, CHOLHDL, LDLDIRECT in the last 72 hours. Thyroid Function Tests: No results for input(s): TSH, T4TOTAL, FREET4, T3FREE, THYROIDAB in the last 72 hours. Anemia Panel: No results for input(s): VITAMINB12, FOLATE, FERRITIN, TIBC, IRON, RETICCTPCT in the last 72 hours. Sepsis Labs: No results for input(s): PROCALCITON, LATICACIDVEN in the last 168 hours.  Recent Results (from the past 240 hour(s))  Respiratory Panel by RT PCR (Flu A&B, Covid) - Nasopharyngeal Swab     Status: None   Collection Time: 01/27/20  8:55 PM   Specimen: Nasopharyngeal Swab  Result Value Ref Range Status   SARS Coronavirus 2 by RT PCR NEGATIVE  NEGATIVE Final    Comment: (NOTE) SARS-CoV-2 target nucleic acids are NOT DETECTED. The SARS-CoV-2 RNA is generally detectable in upper respiratoy specimens during the acute phase of infection. The lowest concentration of SARS-CoV-2 viral copies this assay can detect is 131 copies/mL. A negative result does not preclude SARS-Cov-2 infection and should not be used as the sole basis for treatment or other patient management decisions. A negative result may occur with  improper specimen collection/handling, submission of specimen other than nasopharyngeal swab, presence of viral mutation(s) within the areas targeted by this assay, and inadequate number of viral copies (<131 copies/mL). A negative result must be combined with clinical observations, patient history, and epidemiological information. The expected result is Negative. Fact Sheet for Patients:  PinkCheek.be Fact Sheet for Healthcare Providers:  GravelBags.it This test is not yet ap proved or cleared by the [place] FDA and  has been authorized for detection and/or diagnosis of SARS-CoV-2 by FDA under an Emergency Use Authorization  (EUA). This EUA will remain  in effect (meaning this test can be used) for the duration of the COVID-19 declaration under Section 564(b)(1) of the Act, 21 U.S.C. section 360bbb-3(b)(1), unless the authorization is terminated or revoked sooner.    Influenza A by PCR NEGATIVE NEGATIVE Final   Influenza B by PCR NEGATIVE NEGATIVE Final    Comment: (NOTE) The Xpert Xpress SARS-CoV-2/FLU/RSV assay is intended as an aid in  the diagnosis of influenza from Nasopharyngeal swab specimens and  should not be used as a sole basis for treatment. Nasal washings and  aspirates are unacceptable for Xpert Xpress SARS-CoV-2/FLU/RSV  testing. Fact Sheet for Patients: PinkCheek.be Fact Sheet for Healthcare Providers: GravelBags.it This test is not yet approved or cleared by the [place] FDA and  has been authorized for detection and/or diagnosis of SARS-CoV-2 by  FDA under an Emergency Use Authorization (EUA). This EUA will remain  in effect (meaning this test can be used) for the duration of the  Covid-19 declaration under Section 564(b)(1) of the Act, 21  U.S.C. section 360bbb-3(b)(1), unless the authorization is  terminated or revoked. Performed at [facility], [address], [place], [place] [address]          Radiology Studies: No results found.      Scheduled Meds: . chlorhexidine  15 mL Mouth Rinse BID  . enoxaparin (LOVENOX) injection  40 mg Subcutaneous QHS  . free water  300 mL Per Tube Q4H  . gabapentin  100 mg Oral BID  . mouth rinse  15 mL Mouth Rinse q12n4p  . traZODone  50 mg Per Tube QHS   Continuous Infusions: . dextrose 160 mL/hr (01/31/20 0344)  . feeding supplement (JEVITY 1.2 CAL) 65 mL/hr at 01/30/20 1700     LOS: 4 days    Time spent: 30 minutes    Jaelle Campanile Darleen Crocker, DO [facility] Pager [phone]  If 7PM-7AM, please contact night-coverage [email or web address] Password [place] 01/31/2020,  3:11 PM

## 2020-02-01 LAB — MAGNESIUM: Magnesium: 2.3 mg/dL (ref 1.7–2.4)

## 2020-02-01 LAB — BASIC METABOLIC PANEL
Anion gap: 6 (ref 5–15)
BUN: 13 mg/dL (ref 6–20)
CO2: 29 mmol/L (ref 22–32)
Calcium: 9.2 mg/dL (ref 8.9–10.3)
Chloride: 107 mmol/L (ref 98–111)
Creatinine, Ser: 0.61 mg/dL (ref 0.61–1.24)
GFR calc Af Amer: >60 mL/min (ref >60)
GFR calc non Af Amer: >60 mL/min (ref >60)
Glucose, Bld: 105 mg/dL — ABNORMAL HIGH (ref 70–99)
Potassium: 3.5 mmol/L (ref 3.5–5.1)
Sodium: 142 mmol/L (ref 135–145)

## 2020-02-01 NOTE — TOC Transition Note (Signed)
Transition of Care [facility]) - CM/SW Discharge Note   Patient Details  Name: Thomas [name] MRN: [id number] Date of Birth: 10/15/1971  Transition of Care [facility]) CM/SW Contact:  Allsion Nogales, Chauncey Reading, RN Phone Number: 02/01/2020, 1:28 PM   Clinical Narrative:  Patient discharging home. Home health RN and PT will be resumed. Vaughan Basta of [facility] notified. RN to follow up with patient and assess PEG for leakage.      Final next level of care: [place] Barriers to Discharge: Barriers Resolved        Social Determinants of Health (SDOH) Interventions     Readmission Risk Interventions No flowsheet data found.

## 2020-02-01 NOTE — Progress Notes (Addendum)
IV removed, tube feeding stopped and PEG clamped. D/C instructions reviewed with patient, verbalized understanding. When nurse techs were assisting patient to wheelchair, he tripped on the cane he was attempting to use and fell, landing on his right hip. C/O pain 10/10 and stated "now I need more pain meds." Patient informed that oxycodone was not able to be given, he asked for Tylenol to "knock off the edge." Dr. Manuella Ghazi made aware, no further orders, OK to discharge. Patient transferred to wheelchair and transported to private vehicle.
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
## 2020-02-17 DIAGNOSIS — R911 Solitary pulmonary nodule: Secondary | ICD-10-CM | POA: Insufficient documentation

## 2020-02-17 DIAGNOSIS — L988 Other specified disorders of the skin and subcutaneous tissue: Secondary | ICD-10-CM | POA: Insufficient documentation

## 2020-02-17 DIAGNOSIS — T888XXA Other specified complications of surgical and medical care, not elsewhere classified, initial encounter: Secondary | ICD-10-CM | POA: Insufficient documentation

## 2020-02-17 DIAGNOSIS — W19XXXA Unspecified fall, initial encounter: Secondary | ICD-10-CM | POA: Insufficient documentation

## 2020-02-17 DIAGNOSIS — Z931 Gastrostomy status: Secondary | ICD-10-CM | POA: Insufficient documentation

## 2020-02-17 DIAGNOSIS — Z87731 Personal history of (corrected) tracheoesophageal fistula or atresia: Secondary | ICD-10-CM | POA: Insufficient documentation

## 2020-02-17 DIAGNOSIS — E8889 Other specified metabolic disorders: Secondary | ICD-10-CM | POA: Insufficient documentation

## 2020-03-03 ENCOUNTER — Ambulatory Visit: Payer: Medicaid Other | Attending: Internal Medicine

## 2020-03-03 DIAGNOSIS — Z23 Encounter for immunization: Secondary | ICD-10-CM

## 2020-03-03 NOTE — Progress Notes (Signed)
   Covid-19 Vaccination Clinic  Name:  Thomas [name]    MRN: [id number] DOB: December 12, 1970  03/03/2020  [name] was observed post Covid-19 immunization for 15 minutes without incident. He was provided with Vaccine Information Sheet and instruction to access the V-Safe system.   Mr. Strike was instructed to call 911 with any severe reactions post vaccine: Marland Kitchen Difficulty breathing  . Swelling of face and throat  . A fast heartbeat  . A bad rash all over body  . Dizziness and weakness   Immunizations Administered    Name Date Dose VIS Date Route   Pfizer COVID-19 Vaccine 03/03/2020  9:31 AM 0.3 mL 11/17/2019 Intramuscular   Manufacturer: [place]   Lot: [id number]   [place]: [id number]

## 2020-03-12 ENCOUNTER — Encounter (HOSPITAL_COMMUNITY): Payer: Self-pay | Admitting: Internal Medicine

## 2020-03-12 ENCOUNTER — Other Ambulatory Visit: Payer: Self-pay

## 2020-03-12 ENCOUNTER — Emergency Department (HOSPITAL_COMMUNITY): Payer: Medicaid Other

## 2020-03-12 ENCOUNTER — Inpatient Hospital Stay (HOSPITAL_COMMUNITY)
Admission: EM | Admit: 2020-03-12 | Discharge: 2020-03-16 | DRG: 640 | Disposition: A | Discharge status: Home / Self Care | Payer: Medicaid Other | Source: Ambulatory Visit | Attending: Internal Medicine | Admitting: Internal Medicine

## 2020-03-12 DIAGNOSIS — Z931 Gastrostomy status: Secondary | ICD-10-CM

## 2020-03-12 DIAGNOSIS — Z20822 Contact with and (suspected) exposure to covid-19: Secondary | ICD-10-CM | POA: Diagnosis present

## 2020-03-12 DIAGNOSIS — E871 Hypo-osmolality and hyponatremia: Secondary | ICD-10-CM | POA: Diagnosis not present

## 2020-03-12 DIAGNOSIS — Z681 Body mass index (BMI) 19 or less, adult: Secondary | ICD-10-CM

## 2020-03-12 DIAGNOSIS — E860 Dehydration: Secondary | ICD-10-CM | POA: Diagnosis present

## 2020-03-12 DIAGNOSIS — Z79891 Long term (current) use of opiate analgesic: Secondary | ICD-10-CM

## 2020-03-12 DIAGNOSIS — E870 Hyperosmolality and hypernatremia: Principal | ICD-10-CM | POA: Diagnosis present

## 2020-03-12 DIAGNOSIS — R64 Cachexia: Secondary | ICD-10-CM | POA: Diagnosis present

## 2020-03-12 DIAGNOSIS — Z886 Allergy status to analgesic agent status: Secondary | ICD-10-CM

## 2020-03-12 DIAGNOSIS — M549 Dorsalgia, unspecified: Secondary | ICD-10-CM | POA: Diagnosis present

## 2020-03-12 DIAGNOSIS — Z91018 Allergy to other foods: Secondary | ICD-10-CM

## 2020-03-12 DIAGNOSIS — K2210 Ulcer of esophagus without bleeding: Secondary | ICD-10-CM

## 2020-03-12 DIAGNOSIS — Z8249 Family history of ischemic heart disease and other diseases of the circulatory system: Secondary | ICD-10-CM

## 2020-03-12 DIAGNOSIS — G894 Chronic pain syndrome: Secondary | ICD-10-CM | POA: Diagnosis present

## 2020-03-12 DIAGNOSIS — K219 Gastro-esophageal reflux disease without esophagitis: Secondary | ICD-10-CM | POA: Diagnosis present

## 2020-03-12 DIAGNOSIS — R571 Hypovolemic shock: Secondary | ICD-10-CM | POA: Diagnosis present

## 2020-03-12 DIAGNOSIS — Q390 Atresia of esophagus without fistula: Secondary | ICD-10-CM

## 2020-03-12 DIAGNOSIS — Z811 Family history of alcohol abuse and dependence: Secondary | ICD-10-CM

## 2020-03-12 DIAGNOSIS — Z87891 Personal history of nicotine dependence: Secondary | ICD-10-CM

## 2020-03-12 DIAGNOSIS — I959 Hypotension, unspecified: Secondary | ICD-10-CM | POA: Diagnosis present

## 2020-03-12 DIAGNOSIS — E43 Unspecified severe protein-calorie malnutrition: Secondary | ICD-10-CM | POA: Diagnosis present

## 2020-03-12 DIAGNOSIS — R54 Age-related physical debility: Secondary | ICD-10-CM | POA: Diagnosis present

## 2020-03-12 DIAGNOSIS — N400 Enlarged prostate without lower urinary tract symptoms: Secondary | ICD-10-CM | POA: Diagnosis present

## 2020-03-12 DIAGNOSIS — Z79899 Other long term (current) drug therapy: Secondary | ICD-10-CM

## 2020-03-12 DIAGNOSIS — Z880 Allergy status to penicillin: Secondary | ICD-10-CM

## 2020-03-12 DIAGNOSIS — Z934 Other artificial openings of gastrointestinal tract status: Secondary | ICD-10-CM

## 2020-03-12 LAB — CBC WITH DIFFERENTIAL/PLATELET
Abs Immature Granulocytes: 0.05 10*3/uL (ref 0.00–0.07)
Basophils Absolute: 0.1 10*3/uL (ref 0.0–0.1)
Basophils Relative: 1 %
Eosinophils Absolute: 0.1 10*3/uL (ref 0.0–0.5)
Eosinophils Relative: 1 %
HCT: 54.8 % — ABNORMAL HIGH (ref 39.0–52.0)
Hemoglobin: 15.9 g/dL (ref 13.0–17.0)
Immature Granulocytes: 1 %
Lymphocytes Relative: 22 %
Lymphs Abs: 2 10*3/uL (ref 0.7–4.0)
MCH: 28 pg (ref 26.0–34.0)
MCHC: 29 g/dL — ABNORMAL LOW (ref 30.0–36.0)
MCV: 96.5 fL (ref 80.0–100.0)
Monocytes Absolute: 0.7 10*3/uL (ref 0.1–1.0)
Monocytes Relative: 7 %
Neutro Abs: 6.2 10*3/uL (ref 1.7–7.7)
Neutrophils Relative %: 68 %
Platelets: 177 10*3/uL (ref 150–400)
RBC: 5.68 MIL/uL (ref 4.22–5.81)
RDW: 17.8 % — ABNORMAL HIGH (ref 11.5–15.5)
WBC: 9 10*3/uL (ref 4.0–10.5)
nRBC: 0 % (ref 0.0–0.2)

## 2020-03-12 LAB — COMPREHENSIVE METABOLIC PANEL
ALT: 21 U/L (ref 0–44)
AST: 22 U/L (ref 15–41)
Albumin: 4.4 g/dL (ref 3.5–5.0)
Alkaline Phosphatase: 69 U/L (ref 38–126)
Anion gap: 11 (ref 5–15)
BUN: 69 mg/dL — ABNORMAL HIGH (ref 6–20)
CO2: 38 mmol/L — ABNORMAL HIGH (ref 22–32)
Calcium: 10.9 mg/dL — ABNORMAL HIGH (ref 8.9–10.3)
Chloride: 115 mmol/L — ABNORMAL HIGH (ref 98–111)
Creatinine, Ser: 1.13 mg/dL (ref 0.61–1.24)
GFR calc Af Amer: >60 mL/min (ref >60)
GFR calc non Af Amer: >60 mL/min (ref >60)
Glucose, Bld: 115 mg/dL — ABNORMAL HIGH (ref 70–99)
Potassium: 4.1 mmol/L (ref 3.5–5.1)
Sodium: 164 mmol/L (ref 135–145)
Total Bilirubin: 0.7 mg/dL (ref 0.3–1.2)
Total Protein: 7.7 g/dL (ref 6.5–8.1)

## 2020-03-12 LAB — URINALYSIS, ROUTINE W REFLEX MICROSCOPIC
Bilirubin Urine: NEGATIVE
Glucose, UA: NEGATIVE mg/dL
Ketones, ur: NEGATIVE mg/dL
Nitrite: NEGATIVE
Protein, ur: 30 mg/dL — AB
Specific Gravity, Urine: 1.025 (ref 1.005–1.030)
pH: 5 (ref 5.0–8.0)

## 2020-03-12 LAB — BASIC METABOLIC PANEL
Anion gap: 9 (ref 5–15)
BUN: 47 mg/dL — ABNORMAL HIGH (ref 6–20)
CO2: 34 mmol/L — ABNORMAL HIGH (ref 22–32)
Calcium: 9.6 mg/dL (ref 8.9–10.3)
Chloride: 120 mmol/L — ABNORMAL HIGH (ref 98–111)
Creatinine, Ser: 1 mg/dL (ref 0.61–1.24)
GFR calc Af Amer: >60 mL/min (ref >60)
GFR calc non Af Amer: >60 mL/min (ref >60)
Glucose, Bld: 124 mg/dL — ABNORMAL HIGH (ref 70–99)
Potassium: 3.1 mmol/L — ABNORMAL LOW (ref 3.5–5.1)
Sodium: 163 mmol/L (ref 135–145)

## 2020-03-12 LAB — TROPONIN I (HIGH SENSITIVITY)
Troponin I (High Sensitivity): 4 ng/L (ref <18)
Troponin I (High Sensitivity): 4 ng/L (ref <18)

## 2020-03-12 LAB — SARS CORONAVIRUS 2 (TAT 6-24 HRS): SARS Coronavirus 2: NEGATIVE

## 2020-03-12 LAB — LIPASE, BLOOD: Lipase: 42 U/L (ref 11–51)

## 2020-03-12 MED ORDER — TERAZOSIN HCL 1 MG PO CAPS
2.0000 mg | ORAL_CAPSULE | Freq: Every day | ORAL | Status: DC
  Administered 2020-03-13 – 2020-03-15 (×2): 2 mg
  Filled 2020-03-12: qty 1
  Filled 2020-03-12 (×2): qty 2
  Filled 2020-03-12: qty 1
  Filled 2020-03-12: qty 2
  Filled 2020-03-12: qty 1

## 2020-03-12 MED ORDER — THIAMINE HCL 100 MG PO TABS
100.0000 mg | ORAL_TABLET | Freq: Every day | ORAL | Status: DC
  Administered 2020-03-12 – 2020-03-16 (×5): 100 mg
  Filled 2020-03-12 (×5): qty 1

## 2020-03-12 MED ORDER — ACETAMINOPHEN 650 MG RE SUPP: 650.0000 mg | Freq: Four times a day (QID) | RECTAL | Status: DC | PRN

## 2020-03-12 MED ORDER — ENOXAPARIN SODIUM 40 MG/0.4ML ~~LOC~~ SOLN
40.0000 mg | SUBCUTANEOUS | Status: DC
  Filled 2020-03-12: qty 0.4

## 2020-03-12 MED ORDER — SODIUM CHLORIDE 0.9 % IV SOLN
1.0000 g | INTRAVENOUS | Status: DC
  Administered 2020-03-13: 1 g via INTRAVENOUS
  Filled 2020-03-12: qty 10

## 2020-03-12 MED ORDER — JEVITY 1.2 CAL PO LIQD
1000.0000 mL | ORAL | Status: DC
  Administered 2020-03-12: 1000 mL
  Filled 2020-03-12 (×2): qty 1000

## 2020-03-12 MED ORDER — DEXTROSE 5 % IV SOLN: INTRAVENOUS | Status: DC

## 2020-03-12 MED ORDER — TERAZOSIN HCL 1 MG PO CAPS
2.0000 mg | ORAL_CAPSULE | Freq: Every day | ORAL | Status: DC
  Filled 2020-03-12: qty 2
  Filled 2020-03-12: qty 1

## 2020-03-12 MED ORDER — SODIUM CHLORIDE 0.9 % IV SOLN
1.0000 g | Freq: Once | INTRAVENOUS | Status: AC
  Administered 2020-03-12: 1 g via INTRAVENOUS
  Filled 2020-03-12: qty 10

## 2020-03-12 MED ORDER — ONDANSETRON HCL 4 MG/2ML IJ SOLN: 4.0000 mg | Freq: Four times a day (QID) | INTRAMUSCULAR | Status: DC | PRN

## 2020-03-12 MED ORDER — FREE WATER
300.0000 mL | Status: DC
  Administered 2020-03-12 – 2020-03-13 (×4): 300 mL

## 2020-03-12 MED ORDER — ACETAMINOPHEN 325 MG PO TABS
650.0000 mg | ORAL_TABLET | Freq: Four times a day (QID) | ORAL | Status: DC | PRN
  Administered 2020-03-15: 650 mg via ORAL
  Filled 2020-03-12 (×2): qty 2

## 2020-03-12 MED ORDER — HYDROCODONE-ACETAMINOPHEN 7.5-325 MG/15ML PO SOLN
10.0000 mL | Freq: Four times a day (QID) | ORAL | Status: DC | PRN
  Administered 2020-03-12 – 2020-03-16 (×13): 10 mL via ORAL
  Filled 2020-03-12 (×13): qty 15

## 2020-03-12 MED ORDER — FINASTERIDE 5 MG PO TABS
5.0000 mg | ORAL_TABLET | Freq: Every day | ORAL | Status: DC
  Administered 2020-03-13 – 2020-03-16 (×4): 5 mg via ORAL
  Filled 2020-03-12 (×4): qty 1

## 2020-03-12 MED ORDER — POLYVINYL ALCOHOL 1.4 % OP SOLN
1.0000 [drp] | Freq: Two times a day (BID) | OPHTHALMIC | Status: DC | PRN
  Administered 2020-03-12 – 2020-03-15 (×2): 1 [drp] via OPHTHALMIC
  Filled 2020-03-12 (×2): qty 15

## 2020-03-12 MED ORDER — LIDOCAINE 5 % EX PTCH
1.0000 | MEDICATED_PATCH | Freq: Every day | CUTANEOUS | Status: DC
  Administered 2020-03-12 – 2020-03-15 (×4): 1 via TRANSDERMAL
  Filled 2020-03-12 (×4): qty 1

## 2020-03-12 MED ORDER — TRAZODONE HCL 50 MG PO TABS
50.0000 mg | ORAL_TABLET | Freq: Every day | ORAL | Status: DC
  Administered 2020-03-12 – 2020-03-15 (×4): 50 mg
  Filled 2020-03-12 (×4): qty 1

## 2020-03-12 MED ORDER — SODIUM CHLORIDE 0.9 % IV BOLUS
1000.0000 mL | Freq: Once | INTRAVENOUS | Status: AC
  Administered 2020-03-12: 1000 mL via INTRAVENOUS

## 2020-03-12 MED ORDER — ENOXAPARIN SODIUM 30 MG/0.3ML ~~LOC~~ SOLN: 30.0000 mg | SUBCUTANEOUS | Status: DC

## 2020-03-12 MED ORDER — ONDANSETRON HCL 4 MG PO TABS: 4.0000 mg | ORAL_TABLET | Freq: Four times a day (QID) | ORAL | Status: DC | PRN

## 2020-03-12 NOTE — ED Notes (Signed)
Ostomy bag leaking contents at stoma  (esophagostomy). Bag removed, skin cleansed around stoma. Skin prep applied and new ostomy bag placed.
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
## 2020-03-12 NOTE — ED Notes (Signed)
Pt transported to CT via stretcher at this time.  

## 2020-03-12 NOTE — ED Triage Notes (Signed)
Pt comes from [facility] [place] from his PCP's office, they called [facility] for the patient being hypotensive. Pt reports weakness and right flank pain. Pt denies and nausea or vomiting

## 2020-03-12 NOTE — ED Notes (Signed)
Pt returned from CT at this time. Placed pt back on monitor, pt placed in position of comfort and advised of wait status.   

## 2020-03-13 DIAGNOSIS — I959 Hypotension, unspecified: Secondary | ICD-10-CM | POA: Diagnosis present

## 2020-03-13 DIAGNOSIS — Z886 Allergy status to analgesic agent status: Secondary | ICD-10-CM | POA: Diagnosis not present

## 2020-03-13 DIAGNOSIS — Q390 Atresia of esophagus without fistula: Secondary | ICD-10-CM | POA: Diagnosis not present

## 2020-03-13 DIAGNOSIS — G894 Chronic pain syndrome: Secondary | ICD-10-CM | POA: Diagnosis present

## 2020-03-13 DIAGNOSIS — R64 Cachexia: Secondary | ICD-10-CM | POA: Diagnosis present

## 2020-03-13 DIAGNOSIS — R571 Hypovolemic shock: Secondary | ICD-10-CM | POA: Diagnosis present

## 2020-03-13 DIAGNOSIS — M549 Dorsalgia, unspecified: Secondary | ICD-10-CM | POA: Diagnosis present

## 2020-03-13 DIAGNOSIS — Z79891 Long term (current) use of opiate analgesic: Secondary | ICD-10-CM | POA: Diagnosis not present

## 2020-03-13 DIAGNOSIS — N400 Enlarged prostate without lower urinary tract symptoms: Secondary | ICD-10-CM | POA: Diagnosis present

## 2020-03-13 DIAGNOSIS — Z931 Gastrostomy status: Secondary | ICD-10-CM

## 2020-03-13 DIAGNOSIS — Z681 Body mass index (BMI) 19 or less, adult: Secondary | ICD-10-CM | POA: Diagnosis not present

## 2020-03-13 DIAGNOSIS — K219 Gastro-esophageal reflux disease without esophagitis: Secondary | ICD-10-CM | POA: Diagnosis present

## 2020-03-13 DIAGNOSIS — E43 Unspecified severe protein-calorie malnutrition: Secondary | ICD-10-CM | POA: Diagnosis present

## 2020-03-13 DIAGNOSIS — Z880 Allergy status to penicillin: Secondary | ICD-10-CM | POA: Diagnosis not present

## 2020-03-13 DIAGNOSIS — Z87891 Personal history of nicotine dependence: Secondary | ICD-10-CM | POA: Diagnosis not present

## 2020-03-13 DIAGNOSIS — E860 Dehydration: Secondary | ICD-10-CM | POA: Diagnosis present

## 2020-03-13 DIAGNOSIS — Z79899 Other long term (current) drug therapy: Secondary | ICD-10-CM | POA: Diagnosis not present

## 2020-03-13 DIAGNOSIS — Z91018 Allergy to other foods: Secondary | ICD-10-CM | POA: Diagnosis not present

## 2020-03-13 DIAGNOSIS — Z8249 Family history of ischemic heart disease and other diseases of the circulatory system: Secondary | ICD-10-CM | POA: Diagnosis not present

## 2020-03-13 DIAGNOSIS — Z20822 Contact with and (suspected) exposure to covid-19: Secondary | ICD-10-CM | POA: Diagnosis present

## 2020-03-13 DIAGNOSIS — E870 Hyperosmolality and hypernatremia: Secondary | ICD-10-CM | POA: Diagnosis not present

## 2020-03-13 DIAGNOSIS — Z811 Family history of alcohol abuse and dependence: Secondary | ICD-10-CM | POA: Diagnosis not present

## 2020-03-13 DIAGNOSIS — Z934 Other artificial openings of gastrointestinal tract status: Secondary | ICD-10-CM | POA: Diagnosis not present

## 2020-03-13 DIAGNOSIS — R54 Age-related physical debility: Secondary | ICD-10-CM | POA: Diagnosis present

## 2020-03-13 LAB — BASIC METABOLIC PANEL
Anion gap: 11 (ref 5–15)
Anion gap: 10 (ref 5–15)
Anion gap: 8 (ref 5–15)
BUN: 44 mg/dL — ABNORMAL HIGH (ref 6–20)
BUN: 15 mg/dL (ref 6–20)
BUN: 38 mg/dL — ABNORMAL HIGH (ref 6–20)
CO2: 33 mmol/L — ABNORMAL HIGH (ref 22–32)
CO2: 26 mmol/L (ref 22–32)
CO2: 32 mmol/L (ref 22–32)
Calcium: 9.9 mg/dL (ref 8.9–10.3)
Calcium: 8.8 mg/dL — ABNORMAL LOW (ref 8.9–10.3)
Calcium: 9.1 mg/dL (ref 8.9–10.3)
Chloride: 115 mmol/L — ABNORMAL HIGH (ref 98–111)
Chloride: 105 mmol/L (ref 98–111)
Chloride: 111 mmol/L (ref 98–111)
Creatinine, Ser: 1.07 mg/dL (ref 0.61–1.24)
Creatinine, Ser: 0.95 mg/dL (ref 0.61–1.24)
Creatinine, Ser: 0.96 mg/dL (ref 0.61–1.24)
GFR calc Af Amer: >60 mL/min (ref >60)
GFR calc Af Amer: >60 mL/min (ref >60)
GFR calc Af Amer: >60 mL/min (ref >60)
GFR calc non Af Amer: >60 mL/min (ref >60)
GFR calc non Af Amer: >60 mL/min (ref >60)
GFR calc non Af Amer: >60 mL/min (ref >60)
Glucose, Bld: 113 mg/dL — ABNORMAL HIGH (ref 70–99)
Glucose, Bld: 130 mg/dL — ABNORMAL HIGH (ref 70–99)
Glucose, Bld: 190 mg/dL — ABNORMAL HIGH (ref 70–99)
Potassium: 3.3 mmol/L — ABNORMAL LOW (ref 3.5–5.1)
Potassium: 3.3 mmol/L — ABNORMAL LOW (ref 3.5–5.1)
Potassium: 3.5 mmol/L (ref 3.5–5.1)
Sodium: 159 mmol/L — ABNORMAL HIGH (ref 135–145)
Sodium: 139 mmol/L (ref 135–145)
Sodium: 153 mmol/L — ABNORMAL HIGH (ref 135–145)

## 2020-03-13 LAB — GLUCOSE, CAPILLARY
Glucose-Capillary: 116 mg/dL — ABNORMAL HIGH (ref 70–99)
Glucose-Capillary: 96 mg/dL (ref 70–99)

## 2020-03-13 LAB — CBC
HCT: 50.2 % (ref 39.0–52.0)
Hemoglobin: 14.1 g/dL (ref 13.0–17.0)
MCH: 27.5 pg (ref 26.0–34.0)
MCHC: 28.1 g/dL — ABNORMAL LOW (ref 30.0–36.0)
MCV: 98 fL (ref 80.0–100.0)
Platelets: 141 10*3/uL — ABNORMAL LOW (ref 150–400)
RBC: 5.12 MIL/uL (ref 4.22–5.81)
RDW: 17.3 % — ABNORMAL HIGH (ref 11.5–15.5)
WBC: 7 10*3/uL (ref 4.0–10.5)
nRBC: 0 % (ref 0.0–0.2)

## 2020-03-13 MED ORDER — JEVITY 1.2 CAL PO LIQD
1000.0000 mL | ORAL | Status: DC
  Administered 2020-03-14 – 2020-03-15 (×3): 1000 mL
  Filled 2020-03-13 (×5): qty 1000

## 2020-03-13 MED ORDER — OSMOLITE 1.2 CAL PO LIQD: 1000.0000 mL | ORAL | Status: DC

## 2020-03-13 MED ORDER — FREE WATER
400.0000 mL | Status: DC
  Administered 2020-03-13 – 2020-03-16 (×20): 400 mL

## 2020-03-13 NOTE — Progress Notes (Signed)
Critical lab value of osmolality reported at 0919, Dr. Aline August notified via text page. Waiting on orders

## 2020-03-13 NOTE — Progress Notes (Signed)
Patient ID: Thomas [name], male   DOB: 1971-03-04, 49 y.o.   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  In the ED, he was found to have sodium of 164.  He was started on IV fluids.  Assessment & Plan:  Dehydration with hypernatremia History of recurrent hypernatremia History of esophageal atresia with multiple surgeries including retrosternal colonic interposition, partial esophageal obstruction, creation of split fistula with high output GJ tube -Patient has had history of recurrent dehydration and hypernatremia and multiple recent admissions. -Presented with sodium of 164.  Improving.  Sodium 159 this morning.  Will repeat sodium level.  Continue D5 at 75 cc an hour.  Continue monitoring.  Continue free water flushes. -Patient is to follow-up with GI at [facility] for management of [place] tube and its output  Malnutrition -Patient appears very cachectic and frail.  Continue tube feeds.  Follow nutrition recommendations  BPH -Continue Proscar and Hytrin.  Chronic pain -Continue current pain regimen  Generalized conditioning -PT eval  DVT prophylaxis: Lovenox Code Status: Full Family Communication: Spoke to patient at bedside Disposition Plan: Home in 1 to 3 days once sodium levels improve  Consultants: None  Procedures: None  Antimicrobials: Rocephin from 03/12/2020 onwards  Subjective: Patient seen and examined at bedside.  He is a poor historian.  Denies any overnight fever, vomiting.  Wants to  try some clear liquid.  Objective: Vitals:   03/12/20 2215 03/12/20 2345 03/13/20 0043 03/13/20 0540  BP: (!) 88/60 90/64 117/72 101/64  Pulse: (!) 59 (!) 54 (!) 59 60  Resp: 16 12 16 18   Temp:   (!) 97.5 F (36.4 C) (!) 97.2 F (36.2 C)  TempSrc:   Oral Axillary  SpO2: 98% 99% 100% 97%  Weight:   37.9 kg   Height:   5\' 6"  (1.676 m)     Intake/Output Summary (Last 24 hours) at 03/13/2020 1349 Last data filed at 03/13/2020 0700 Gross per 24 hour  Intake 307.98 ml  Output 1000 ml  Net -692.02 ml   Filed Weights   03/12/20 0947 03/13/20 0043  Weight: 38.6 kg 37.9 kg    Examination:  General exam: Appears calm and comfortable.  Looks chronically ill and very thinly built/cachectic. Respiratory system: Bilateral decreased breath sounds at bases with some scattered crackles.  Splint ostomy to right upper chest wall with drainage Cardiovascular system: S1 & S2 heard, intermittently bradycardic Gastrointestinal system: Abdomen is nondistended, soft and nontender. Normal bowel sounds heard.  G-tube in place. Extremities: No cyanosis, clubbing, edema  Central nervous system: Alert and oriented. No focal neurological deficits. Moving extremities Skin: No rashes, lesions or ulcers Psychiatry: Flat affect.    Data Reviewed: I have personally reviewed following labs and imaging studies  CBC: Recent Labs  Lab 03/12/20 0951 03/13/20 0057  WBC 9.0 7.0  NEUTROABS 6.2  --   HGB 15.9 14.1  HCT 54.8* 50.2  MCV 96.5 98.0  PLT 177 [id number]*   Basic Metabolic Panel: Recent Labs  Lab 03/12/20 0951 03/12/20 2101 03/13/20 0057 03/13/20 0751 03/13/20 0804  NA 164* 163* 159* 153* 139  K 4.1 3.1* 3.3* 3.3*  3.5  CL 115* 120* 115* 111 105  CO2 38* 34* 33* 32 26  GLUCOSE 115* 124* 113* 130* 190*  BUN 69* 47* 44* 38* 15  CREATININE 1.13 1.00 1.07 0.96 0.95  CALCIUM 10.9* 9.6 9.9 9.1 8.8*   GFR: Estimated Creatinine Clearance: 51 mL/min (by C-G formula based on SCr of 0.95  mg/dL). Liver Function Tests: Recent Labs  Lab 03/12/20 0951  AST 22  ALT 21  ALKPHOS 69  BILITOT 0.7  PROT 7.7  ALBUMIN 4.4   Recent Labs  Lab 03/12/20 0951  LIPASE 42   No results for input(s): AMMONIA in the last 168 hours. Coagulation Profile: No results for input(s): INR, PROTIME in the last 168 hours. Cardiac Enzymes: No results for input(s): CKTOTAL, CKMB, CKMBINDEX, TROPONINI in the last 168 hours. BNP (last 3 results) No results for input(s): PROBNP in the last 8760 hours. HbA1C: No results for input(s): HGBA1C in the last 72 hours. CBG: No results for input(s): GLUCAP in the last 168 hours. Lipid Profile: No results for input(s): CHOL, HDL, LDLCALC, TRIG, CHOLHDL, LDLDIRECT in the last 72 hours. Thyroid Function Tests: No results for input(s): TSH, T4TOTAL, FREET4, T3FREE, THYROIDAB in the last 72 hours. Anemia Panel: No results for input(s): VITAMINB12, FOLATE, FERRITIN, TIBC, IRON, RETICCTPCT in the last 72 hours. Sepsis Labs: No results for input(s): PROCALCITON, LATICACIDVEN in the last 168 hours.  Recent Results (from the past 240 hour(s))  Urine culture     Status: Abnormal (Preliminary result)   Collection Time: 03/12/20  9:51 AM   Specimen: Urine, Random  Result Value Ref Range Status   Specimen Description URINE, RANDOM  Final   Special Requests NONE  Final   Culture (A)  Final    40,000 COLONIES/mL KLEBSIELLA PNEUMONIAE 20,000 COLONIES/mL STAPHYLOCOCCUS AUREUS SUSCEPTIBILITIES TO FOLLOW Performed at [facility], [address], [place], [place] [address]    Report Status PENDING  Incomplete  SARS CORONAVIRUS 2 (TAT 6-24 HRS) Nasopharyngeal Nasopharyngeal Swab     Status: None   Collection Time: 03/12/20  2:25 PM   Specimen: Nasopharyngeal Swab  Result Value Ref Range Status   SARS Coronavirus 2 NEGATIVE NEGATIVE Final    Comment: (NOTE) SARS-CoV-2 target nucleic acids are NOT DETECTED. The SARS-CoV-2 RNA is generally detectable in  upper and lower respiratory specimens during the acute phase of infection. Negative results do not preclude SARS-CoV-2 infection, do not rule out co-infections with other pathogens, and should not be used as the sole basis for treatment or other patient management decisions. Negative results must be combined with clinical observations, patient history, and epidemiological information. The expected result is Negative. Fact Sheet for Patients: SugarRoll.be Fact Sheet for Healthcare Providers: https://www.woods-mathews.com/ This test is not yet approved or cleared by the [place] FDA and  has been authorized for detection and/or diagnosis of SARS-CoV-2 by FDA under an Emergency Use Authorization (EUA). This EUA will remain  in effect (meaning this test can be used) for the duration of the COVID-19 declaration under Section 56 4(b)(1) of the Act, 21 U.S.C. section 360bbb-3(b)(1), unless the authorization is terminated or revoked sooner. Performed at [facility], [place] [address] [place], [place] [address]          Radiology Studies: DG Chest Portable 1 View  Result Date: 03/12/2020 CLINICAL DATA:  Chest pain. EXAM: PORTABLE CHEST 1 VIEW COMPARISON:  CT 06/11/2019.  Chest x-ray 06/11/2019. FINDINGS: Mediastinum and hilar structures are stable. Prior colonic interposition. Stable  atelectatic changes and or pleuroparenchymal scarring noted on the right. Stable metallic linear densities noted over the right chest. No pleural effusion or pneumothorax. No acute bony abnormality identified. IMPRESSION: 1. Mediastinum and hilar structures stable. Prior colonic interposition. 2. Stable atelectatic changes and or pleuroparenchymal scarring noted on the right. 3. Stable metallic linear densities noted over the right chest. No pneumothorax. Electronically Signed   By: Marcello Moores  Register   On: 03/12/2020 10:18   CT Renal Stone Study  Result Date:  03/12/2020 CLINICAL DATA:  Flank pain, kidney stones suspected EXAM: CT ABDOMEN AND PELVIS WITHOUT CONTRAST TECHNIQUE: Multidetector CT imaging of the abdomen and pelvis was performed following the standard protocol without IV contrast. COMPARISON:  01/16/2019 FINDINGS: Lower chest: No acute abnormality. Hepatobiliary: No solid liver abnormality is seen. No gallstones, gallbladder wall thickening, or biliary dilatation. Pancreas: Unremarkable. No pancreatic ductal dilatation or surrounding inflammatory changes. Spleen: Normal in size without significant abnormality. Adrenals/Urinary Tract: Adrenal glands are unremarkable. Evaluation for urinary tract calculus is somewhat limited by extreme cachexia. Within this limitation, there is a new 3 mm calculus inferior to the right kidney in the expected vicinity of the proximal ureter (series 3, image 33). No other evidence of urinary tract calculus. No high-grade hydronephrosis. Thickening of the decompressed urinary bladder. Stomach/Bowel: Percutaneous gastrostomy tube. Large burden of stool dense stool balls throughout the colon and rectum. Postoperative findings of colonic interposition, partially imaged. Vascular/Lymphatic: Infrarenal IVC filter. No enlarged abdominal or pelvic lymph nodes. Reproductive: No mass or other significant abnormality. Other: No abdominal wall hernia or abnormality. No abdominopelvic ascites. Multiple dense calcifications throughout the abdomen and pelvis. Musculoskeletal: Extreme cachexia. IMPRESSION: 1. Evaluation for urinary tract calculus is somewhat limited by extreme cachexia, within this limitation, there is a new 3 mm calculus inferior to the right kidney in the expected vicinity of the proximal ureter. No other evidence of urinary tract calculus and no high-grade hydronephrosis. 2. Nonspecific thickening of the decompressed urinary bladder. Correlate with urinalysis. 3. Postoperative findings of the bowel, including of colonic  interposition. Electronically Signed   By: Eddie Candle M.D.   On: 03/12/2020 13:18        Scheduled Meds:  enoxaparin (LOVENOX) injection  30 mg Subcutaneous Q24H   finasteride  5 mg Oral Daily   free water  400 mL Per Tube Q4H   lidocaine  1 patch Transdermal Daily   terazosin  2 mg Per Tube QHS   thiamine  100 mg Per Tube Daily   traZODone  50 mg Per Tube QHS   Continuous Infusions:  cefTRIAXone (ROCEPHIN)  IV 1 g (03/13/20 1222)   dextrose 75 mL/hr at 03/12/20 1950   feeding supplement (JEVITY 1.2 CAL) 1,000 mL (03/12/20 1922)          Aline August, MD [facility] 03/13/2020, 1:49 PM

## 2020-03-13 NOTE — Progress Notes (Signed)
Initial Nutrition Assessment  DOCUMENTATION CODES:   Underweight, Severe malnutrition in context of chronic illness  INTERVENTION:   -ContinueJevity 1.2@ 28ml/hr via g-tube   Free water flushes per MD: 400 ml free water flushes every 6 hours  Tube feeding regimen provides1872kcal (100% of needs),87grams of protein, and 1264ml of H2O. 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  GOAL:   Patient will meet greater than or equal to 90% of their needs  MONITOR:   Diet advancement, Labs, Weight trends, Skin, I & O's  REASON FOR ASSESSMENT:   Consult Assessment of nutrition requirement/status, Enteral/tube feeding initiation and management  ASSESSMENT:   Thomas [name] is a 49 y.o. 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   He reports that he is hurting and in pain in his back and legs and side.  Symptoms started about 3 days ago.  He doesn't think he has had more output as usual.  He does not know why he is dehydrated.  He has chronic pain for which he takes chronic narcotics but his pain is worse than usual.  Pt admitted with hypotension.   Reviewed I/O's: +308 ml x 24 hours  Drain output (esophagostomy): 1 L x 24 hours  Attempted to speak with pt x 2, however, either receiving nursing care or on phone and did not disengage in conversation. Pt with significant areas of fat and muscle loss upon observation.   Case discussed with RNCM; pt familiar to [facility] and RD due to multiple prior admissions. There is concern of pt's  compliance to TF regimen at home (particularly free water flushes) given ongoing weight loss and multiple admissions for dehydration related issues. At baseline, pt consumes liquids, however, these are drained from esophagostomy.   Pt follows with outpatient RD at [facility]; reviewed RD note from last mont, which confirmed home TF regimen of 4 cans Twocal HN and 1 bottle of Boost High Protein via PEG in addition to 1 L H20 throughout the day (240 ml 4 times daily). Home TF regimen provides approximately 2140 kcals, 98 grams protein, and 1664 ml free water daily, meeting 100% of estimated kcal and protein needs.  Reviewed wt hx; pt has experienced a 28% wt loss over the past 6 months, which is significant for time frame.   Labs reviewed.   NUTRITION - FOCUSED PHYSICAL EXAM:    Most Recent Value  Orbital Region  Severe depletion  Upper Arm Region  Severe depletion  Thoracic and Lumbar Region  Severe depletion  Buccal Region  Severe depletion  Temple Region  Severe depletion  Clavicle Bone Region  Severe depletion  Clavicle and Acromion Bone Region  Unable to assess  Scapular Bone Region  Unable to assess  Dorsal Hand  Severe depletion  Patellar Region  Unable to assess  Anterior Thigh Region  Unable to assess  Posterior Calf Region  Unable to assess  Edema (RD Assessment)  None  Hair  Unable to assess  Eyes  Unable to assess  Mouth  Unable to assess  Skin  Unable to assess  Nails  Unable to assess       Diet Order:  Diet Order            Diet NPO time specified Except for: Sips with Meds  Diet effective now              EDUCATION NEEDS:   No education needs have been identified at this time  Skin:  Skin Assessment: Reviewed RN Assessment  Last BM:  Unknown  Height:   Ht Readings from Last 1 Encounters:  03/13/20 5\' 6"  (1.676 m)    Weight:   Wt Readings from Last 1 Encounters:  03/13/20 37.9 kg    Ideal Body Weight:  64.5 kg  BMI:  Body mass index is  13.49 kg/m.  Estimated Nutritional Needs:   Kcal:  1700-1900  Protein:  90-105 grams  Fluid:  > 2 L    [name], RD, LDN, [place] Registered Dietitian II Certified Diabetes Care and Education Specialist Please refer to [facility] for RD and/or RD on-call/weekend/after hours pager

## 2020-03-13 NOTE — Progress Notes (Signed)
Dr. Cyndi Bender, text paged in regards to BP of 87/57.  Patient reports no symptoms and verbalizes BP of 90s is normal for him, however at times BP does run in the high 80s. D5 infusing @75ml /hr and 400 ml of free water given as schedule. Will continue to assess BP.

## 2020-03-14 LAB — CBC WITH DIFFERENTIAL/PLATELET
Abs Immature Granulocytes: 0.03 10*3/uL (ref 0.00–0.07)
Basophils Absolute: 0 10*3/uL (ref 0.0–0.1)
Basophils Relative: 1 %
Eosinophils Absolute: 0.1 10*3/uL (ref 0.0–0.5)
Eosinophils Relative: 2 %
HCT: 38.2 % — ABNORMAL LOW (ref 39.0–52.0)
Hemoglobin: 11.4 g/dL — ABNORMAL LOW (ref 13.0–17.0)
Immature Granulocytes: 1 %
Lymphocytes Relative: 33 %
Lymphs Abs: 1.6 10*3/uL (ref 0.7–4.0)
MCH: 28.2 pg (ref 26.0–34.0)
MCHC: 29.8 g/dL — ABNORMAL LOW (ref 30.0–36.0)
MCV: 94.6 fL (ref 80.0–100.0)
Monocytes Absolute: 0.3 10*3/uL (ref 0.1–1.0)
Monocytes Relative: 7 %
Neutro Abs: 2.8 10*3/uL (ref 1.7–7.7)
Neutrophils Relative %: 56 %
Platelets: 88 10*3/uL — ABNORMAL LOW (ref 150–400)
RBC: 4.04 MIL/uL — ABNORMAL LOW (ref 4.22–5.81)
RDW: 16.3 % — ABNORMAL HIGH (ref 11.5–15.5)
WBC: 4.9 10*3/uL (ref 4.0–10.5)
nRBC: 0 % (ref 0.0–0.2)

## 2020-03-14 LAB — BASIC METABOLIC PANEL
Anion gap: 10 (ref 5–15)
BUN: 18 mg/dL (ref 6–20)
CO2: 33 mmol/L — ABNORMAL HIGH (ref 22–32)
Calcium: 9.1 mg/dL (ref 8.9–10.3)
Chloride: 105 mmol/L (ref 98–111)
Creatinine, Ser: 0.8 mg/dL (ref 0.61–1.24)
GFR calc Af Amer: >60 mL/min (ref >60)
GFR calc non Af Amer: >60 mL/min (ref >60)
Glucose, Bld: 102 mg/dL — ABNORMAL HIGH (ref 70–99)
Potassium: 3.5 mmol/L (ref 3.5–5.1)
Sodium: 148 mmol/L — ABNORMAL HIGH (ref 135–145)

## 2020-03-14 LAB — MAGNESIUM: Magnesium: 2.3 mg/dL (ref 1.7–2.4)

## 2020-03-14 LAB — GLUCOSE, CAPILLARY
Glucose-Capillary: 132 mg/dL — ABNORMAL HIGH (ref 70–99)
Glucose-Capillary: 89 mg/dL (ref 70–99)
Glucose-Capillary: 109 mg/dL — ABNORMAL HIGH (ref 70–99)
Glucose-Capillary: 89 mg/dL (ref 70–99)

## 2020-03-14 LAB — OSMOLALITY: Osmolality: 335 mOsm/kg (ref 275–295)

## 2020-03-14 LAB — URINE CULTURE: Culture: 40000 — AB

## 2020-03-14 NOTE — Progress Notes (Addendum)
Patient ID: Thomas [name], male   DOB: 03-05-1971, 49 y.o.   MRN: [id number]  PROGRESS NOTE    BRANDIE BILOTTA  [id number] DOB: 03/21/1971 DOA: 03/12/2020 PCP: [name], NP   Brief Narrative:  49 year old male with history of Esophageal Atresia s/p multiple surgeries:  Patient had correction of esophageal atresia as an infant with colonic interposition. He recently received a partial esophageal resection with spit fistula revision 06/2019. 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 Pt's initial surgeries were through Dr. Maude Leriche at [facility] however he currently follows with [facility] thoracic surgery.  -He has a high output GJ tube, recurrent admissions for hypernatremia (12/18/2019-12/22/2019; 12/30/2019-01/05/2020; 01/13/2020-01/18/2020; 01/27/2020-02/01/2020) presented with hypotension, systolic blood pressure around 70 on 03/12/2020.  In the ED, he was found to have sodium of 164.  He was started on IV fluids.  Assessment & Plan:  Recurrent hypernatremia Dehydration Hypovolemic shock -Has a high output state, loss of excessive amounts of fluids through his spit ostomy of esophagus -Also his intake of free water through his GJ tube is poor and unreliable, based on hospitalization at [facility] he was recommended to use 350 to 375 mL every 4 hours through his GJ tube, due to his esophageal surgery and spit fistula continues to have considerable amounts of PO water intake that does not get absorbed, patient admits to poor compliance with free water flushes -Sodium and blood pressure are improving -Continue D5 water today, monitor I/os closely -Dietitian consult, patient education regarding appropriate amount of free water intake to G-tube and not just p.o. -BMP in a.m  Esophageal atresia status post multiple surgeries -Patient has a history of tracheoesophageal fistula, esophageal atresia as an infant treated with colonic interposition,, subsequently had partial esophageal resection with spit  fistula revision on 06/2019. -He has a GJ tube for feeds and nutrition -Initial surgeries were done by Dr. Maude Leriche at [facility], currently follows with [facility] thoracic surgery  Severe patient calorie malnutrition -Patient appears very cachectic and frail.  Continue tube feeds.  Follow nutrition recommendations  BPH -Continue Proscar and Hytrin  Chronic pain -Continue current pain regimen  Generalized conditioning -PT eval  DVT prophylaxis: Lovenox Code Status: Full Family Communication: Patient and mother at bedside Disposition Plan: Home pending stabilization of blood pressure, hyponatremia, of IV fluids  Consultants: None  Procedures: None  Antimicrobials: Rocephin from 03/12/2020 onwards  Subjective: -Feels better, demanding oral liquids  Objective: Vitals:   03/14/20 0600 03/14/20 0825 03/14/20 0952 03/14/20 1425  BP: (!) 94/51 (!) 76/54 (!) 99/54 (!) 104/57  Pulse: 81 61 (!) 56 63  Resp: 12 16 16 16   Temp: 97.9 F (36.6 C) 98.2 F (36.8 C) 97.9 F (36.6 C)   TempSrc: Oral Oral Oral   SpO2: 96% 99% 100% (!) 74%  Weight:      Height:        Intake/Output Summary (Last 24 hours) at 03/14/2020 1503 Last data filed at 03/14/2020 [id number] Gross per 24 hour  Intake 974 ml  Output 3350 ml  Net -2376 ml   Filed Weights   03/12/20 0947 03/13/20 0043  Weight: 38.6 kg 37.9 kg    Examination:  Gen: Chronically ill extremely cachectic male, sitting up in bed HEENT: No JVD, spit ostomy to right upper chest wall with clear drainage Lungs: Clear CVS: S1-S2, tachycardic Abd: soft, Non tender, non distended, BS present, G-tube noted Extremities: No edema Skin: no new rashes Psychiatry: Flat affect.    Data Reviewed: I  have personally reviewed following labs and imaging studies  CBC: Recent Labs  Lab 03/12/20 0951 03/13/20 0057 03/14/20 0811  WBC 9.0 7.0 4.9  NEUTROABS 6.2  --  2.8  HGB 15.9 14.1 11.4*  HCT 54.8* 50.2 38.2*  MCV 96.5 98.0 94.6  PLT 177 141* 88*    Basic Metabolic Panel: Recent Labs  Lab 03/12/20 2101 03/13/20 0057 03/13/20 0751 03/13/20 0804 03/14/20 0811  NA 163* 159* 153* 139 148*  K 3.1* 3.3* 3.3* 3.5 3.5  CL 120* 115* 111 105 105  CO2 34* 33* 32 26 33*  GLUCOSE 124* 113* 130* 190* 102*  BUN 47* 44* 38* 15 18  CREATININE 1.00 1.07 0.96 0.95 0.80  CALCIUM 9.6 9.9 9.1 8.8* 9.1  MG  --   --   --   --  2.3   GFR: Estimated Creatinine Clearance: 60.5 mL/min (by C-G formula based on SCr of 0.8 mg/dL). Liver Function Tests: Recent Labs  Lab 03/12/20 0951  AST 22  ALT 21  ALKPHOS 69  BILITOT 0.7  PROT 7.7  ALBUMIN 4.4   Recent Labs  Lab 03/12/20 0951  LIPASE 42   No results for input(s): AMMONIA in the last 168 hours. Coagulation Profile: No results for input(s): INR, PROTIME in the last 168 hours. Cardiac Enzymes: No results for input(s): CKTOTAL, CKMB, CKMBINDEX, TROPONINI in the last 168 hours. BNP (last 3 results) No results for input(s): PROBNP in the last 8760 hours. HbA1C: No results for input(s): HGBA1C in the last 72 hours. CBG: Recent Labs  Lab 03/13/20 2113 03/13/20 2353 03/14/20 0428 03/14/20 0818 03/14/20 1244  GLUCAP 116* 96 132* 89 109*   Lipid Profile: No results for input(s): CHOL, HDL, LDLCALC, TRIG, CHOLHDL, LDLDIRECT in the last 72 hours. Thyroid Function Tests: No results for input(s): TSH, T4TOTAL, FREET4, T3FREE, THYROIDAB in the last 72 hours. Anemia Panel: No results for input(s): VITAMINB12, FOLATE, FERRITIN, TIBC, IRON, RETICCTPCT in the last 72 hours. Sepsis Labs: No results for input(s): PROCALCITON, LATICACIDVEN in the last 168 hours.  Recent Results (from the past 240 hour(s))  Urine culture     Status: Abnormal   Collection Time: 03/12/20  9:51 AM   Specimen: Urine, Random  Result Value Ref Range Status   Specimen Description URINE, RANDOM  Final   Special Requests   Final    NONE Performed at [facility], [address], [place], [place] [address]      Culture (A)  Final    40,000 COLONIES/mL KLEBSIELLA PNEUMONIAE 20,000 COLONIES/mL METHICILLIN RESISTANT STAPHYLOCOCCUS AUREUS    Report Status 03/14/2020 FINAL  Final   Organism ID, Bacteria KLEBSIELLA PNEUMONIAE (A)  Final   Organism ID, Bacteria METHICILLIN RESISTANT STAPHYLOCOCCUS AUREUS (A)  Final      Susceptibility   Klebsiella pneumoniae - MIC*    AMPICILLIN >=32 RESISTANT Resistant     CEFAZOLIN <=4 SENSITIVE Sensitive     CEFTRIAXONE <=0.25 SENSITIVE Sensitive     CIPROFLOXACIN <=0.25 SENSITIVE Sensitive     GENTAMICIN <=1 SENSITIVE Sensitive     IMIPENEM <=0.25 SENSITIVE Sensitive     NITROFURANTOIN 64 INTERMEDIATE Intermediate     TRIMETH/SULFA <=20 SENSITIVE Sensitive     AMPICILLIN/SULBACTAM 8 SENSITIVE Sensitive     PIP/TAZO <=4 SENSITIVE Sensitive     * 40,000 COLONIES/mL KLEBSIELLA PNEUMONIAE   Methicillin resistant staphylococcus aureus - MIC*    CIPROFLOXACIN >=8 RESISTANT Resistant     GENTAMICIN <=0.5 SENSITIVE Sensitive     NITROFURANTOIN <=  16 SENSITIVE Sensitive     OXACILLIN >=4 RESISTANT Resistant     TETRACYCLINE <=1 SENSITIVE Sensitive     VANCOMYCIN 1 SENSITIVE Sensitive     TRIMETH/SULFA <=10 SENSITIVE Sensitive     CLINDAMYCIN <=0.25 SENSITIVE Sensitive     RIFAMPIN <=0.5 SENSITIVE Sensitive     Inducible Clindamycin NEGATIVE Sensitive     * 20,000 COLONIES/mL METHICILLIN RESISTANT STAPHYLOCOCCUS AUREUS  SARS CORONAVIRUS 2 (TAT 6-24 HRS) Nasopharyngeal Nasopharyngeal Swab     Status: None   Collection Time: 03/12/20  2:25 PM   Specimen: Nasopharyngeal Swab  Result Value Ref Range Status   SARS Coronavirus 2 NEGATIVE NEGATIVE Final    Comment: (NOTE) SARS-CoV-2 target nucleic acids are NOT DETECTED. The SARS-CoV-2 RNA is generally detectable in upper and lower respiratory specimens during the acute phase of infection. Negative results do not preclude SARS-CoV-2 infection, do not rule out co-infections with other pathogens, and should not be  used as the sole basis for treatment or other patient management decisions. Negative results must be combined with clinical observations, patient history, and epidemiological information. The expected result is Negative. Fact Sheet for Patients: SugarRoll.be Fact Sheet for Healthcare Providers: https://www.woods-mathews.com/ This test is not yet approved or cleared by the [place] FDA and  has been authorized for detection and/or diagnosis of SARS-CoV-2 by FDA under an Emergency Use Authorization (EUA). This EUA will remain  in effect (meaning this test can be used) for the duration of the COVID-19 declaration under Section 56 4(b)(1) of the Act, 21 U.S.C. section 360bbb-3(b)(1), unless the authorization is terminated or revoked sooner. Performed at [facility], [place] [address], [place], [place] [address]          Radiology Studies: No results found.      Scheduled Meds: . enoxaparin (LOVENOX) injection  30 mg Subcutaneous Q24H  . finasteride  5 mg Oral Daily  . free water  400 mL Per Tube Q4H  . lidocaine  1 patch Transdermal Daily  . terazosin  2 mg Per Tube QHS  . thiamine  100 mg Per Tube Daily  . traZODone  50 mg Per Tube QHS   Continuous Infusions: . dextrose 100 mL/hr at 03/14/20 0000  . feeding supplement (JEVITY 1.2 CAL) 1,000 mL (03/14/20 0445)          Domenic Polite, MD [facility] 03/14/2020, 3:03 PM

## 2020-03-14 NOTE — TOC Initial Note (Addendum)
Transition of Care [facility]) - Initial/Assessment Note    Patient Details  Name: Thomas [name] MRN: [id number] Date of Birth: August 28, 1971  [facility] CM/SW Contact:    Marilu Favre, RN Phone Number: 03/14/2020, 12:56 PM  Clinical Narrative:                 Damaris Schooner to patient and his mother Doneta Public at bedside.   Address in Epic is patient's home address, however, he is currently staying with his mother at [address], [place], [place] [id number].    Mother's contact information is [phone].   Patient known to NCM from prior admission. Patient was receiving tube feedings from [facility], however he has been buying them lately and is "running low".  Patient states he has information at address in Epic that he needs so he can call [employer] and order more tube feedings, however his girl friend lives there and has vision problems and cannot find information needed. NCM explained NCM will call Zac with [facility] to check on what is needed. Patient in agreement and requested [employer] call his mother. Spoke with Zac with [employer] explained above and provided Brenda's information.   Patient also stated his home health ended because he moved to [place]. NCM spoke with Butch Penny with [facility]. Per their records patient was admitted to [facility] and [facility] never received resumption of care orders. Currently they have no staff to pick patient back up.   Patient stated that [facility] were helping him get supplies. Butch Penny will check on same and let NCM know, if they were ordering supplies the name of company.   Will continue to follow.   Butch Penny with [facility] returned call. They use [employer] 1 [phone] to order supplies. Called same they have no record of patient. They do take Medicaid and can order supplies, they would need a list of a months worth of supplies.   Spoke to donna again who confirmed company for ostomy  supplies is [employer].   Spoke to patient , he believes ostomy company is [employer], government.   Called Zac with [facility] they are providing tube feeds and now have updated information on how to contact patient and where to ship tube feedings to.   Asked MD if WOC would be appropriate to get recommendations on a list of supplies to order. Awaiting response   Spoke to ZAc with [facility]. [employer] reached out to Doneta Public today discussed supplies and Hassan Rowan reordered supplies for a month. MD aware   Patient's PCP is Dr Coralie Carpen   Expected Discharge Plan: Home/Self Care Barriers to Discharge: Continued Medical Work up   Patient Goals and CMS Choice Patient states their goals for this hospitalization and ongoing recovery are:: to return to mother's address CMS Medicare.gov Compare Post Acute Care list provided to:: Patient    Expected Discharge Plan and Services Expected Discharge Plan: Home/Self Care       Living arrangements for the past 2 months: Single Family Home                   DME Agency: [employer]       HH Arranged: NA          Prior Living Arrangements/Services Living arrangements for the past 2 months: Single Family Home Lives with:: Parents Patient language and need for interpreter reviewed:: Yes Do you feel safe  going back to the place where you live?: Yes      Need for Family Participation in Patient Care: Yes (Comment) Care giver support system in place?: Yes (comment)   Criminal Activity/Legal Involvement Pertinent to Current Situation/Hospitalization: No - Comment as needed  Activities of Daily Living Home Assistive Devices/Equipment: Gilford Rile (specify type) ADL Screening (condition at time of admission) Patient's cognitive ability adequate to safely complete daily activities?: Yes Is the patient deaf or have difficulty hearing?: No Does the patient have difficulty seeing, even when wearing glasses/contacts?: No Does the patient have  difficulty concentrating, remembering, or making decisions?: No Patient able to express need for assistance with ADLs?: Yes Does the patient have difficulty dressing or bathing?: No Independently performs ADLs?: Yes (appropriate for developmental age) Does the patient have difficulty walking or climbing stairs?: Yes Weakness of Legs: Both Weakness of Arms/Hands: Both  Permission Sought/Granted   Permission granted to share information with : Yes, Verbal Permission Granted  Share Information with NAME: Doneta Public mother           Emotional Assessment Appearance:: Appears older than stated age Attitude/Demeanor/Rapport: Engaged Affect (typically observed): Accepting Orientation: : Oriented to Self, Oriented to Place, Oriented to  Time, Oriented to Situation Alcohol / Substance Use: Not Applicable Psych Involvement: No (comment)  Admission diagnosis:  Hypernatremia [E87.0] Dehydration with hypernatremia [E87.0] Patient Active Problem List   Diagnosis Date Noted  . Dehydration with hypernatremia 03/12/2020  . Chronic pain syndrome 03/12/2020  . Low grade fever 01/13/2020  . 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 Protein-calorie malnutrition, severe 12/20/2019  . Hypernatremia 12/18/2019  . Right-sided chest pain 01/31/2019  . Anastomotic ulcer 01/31/2019   PCP:  [name], NP Pharmacy:   [facility], [place] [address] [place] [address] Phone: [phone] Fax: [phone]     Social Determinants of Health (SDOH) Interventions    Readmission Risk Interventions No flowsheet data found.

## 2020-03-14 NOTE — Progress Notes (Signed)
Nutrition Follow-up  DOCUMENTATION CODES:   Underweight, Severe malnutrition in context of chronic illness  INTERVENTION:   -ContinueJevity 1.2@ 58m/hr via g-tube   Free water flushes per MD: 400 ml free water flushes every 6 hours  Tube feeding regimen provides1872kcal (100% of needs),87grams of protein, and 12563mof H2O. 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  Ongoing  GOAL:   Patient will meet greater than or equal to 90% of their needs  Met with TF  MONITOR:   Diet advancement, Labs, Weight trends, Skin, I & O's  REASON FOR ASSESSMENT:   Consult Enteral/tube feeding initiation and management  ASSESSMENT:   Thomas MICHAUXs a [age].o. 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   He reports that he is hurting and in pain in his back and legs and side.  Symptoms started about 3 days ago.  He doesn't think he has had more output as usual.  He does not know why he is dehydrated.  He has chronic pain for which he takes chronic narcotics but his pain is worse than usual.  Reviewed I/O's: -1.9 L x 24 hours and -1.6 L since admission  UOP: 1.8 L x 24 hours   Drain output (esophagostomy): 1.2 L x 24 hours  [facility], [facility] confirmed home TF regimen of 4 cans Twocal HN and 1 bottle of Boost High Protein via PEG in addition to 1 L H20  throughout the day (240 ml 4 times daily). Home TF regimen provides approximately 2140 kcals, 98 grams protein, and 1664 ml free water daily, meeting 100% of estimated kcal and protein needs. If pt continues with home TF regimen, will need additional 1336 ml total of free water flushes via tube (ex 225 ml free water flush every 4 hours, which will provides total of 1350 ml fluid and total of 2014 ml free water daily).   Pt currently receiving TF of Jevity 1.2 due to formulary substitution- infusing at 65 ml/hr with 400 ml free water flushes every 6 hours. With current TF regimen, pt will require at least 741 ml total extra free water (ex 200 ml free water flush every 6 hours) to provide 2059 ml daily.   Medications reviewed and include dextrose 5% solution @ 50 ml/hr.   Labs reviewed: Na: 148, CBGS: 89-132.   Diet Order:   Diet Order            Diet clear liquid Room service appropriate? Yes; Fluid consistency: Thin  Diet effective now              EDUCATION NEEDS:   No education needs have been identified at this time  Skin:  Skin Assessment: Reviewed RN Assessment  Last BM:  Unknown  Height:   Ht Readings from Last 1 Encounters:  03/13/20 5' 6"  (1.676 m)    Weight:   Wt Readings from Last 1 Encounters:  03/13/20 37.9 kg    Ideal Body Weight:  64.5 kg  BMI:  Body mass index is 13.49 kg/m.  Estimated Nutritional Needs:   Kcal:  1700-1900  Protein:  90-105 grams  Fluid:  > 2 L    [name], RD, LDN, [place] Registered Dietitian II Certified Diabetes Care and Education Specialist Please refer to [facility] for RD and/or RD on-call/weekend/after hours pager

## 2020-03-15 LAB — BASIC METABOLIC PANEL
Anion gap: 7 (ref 5–15)
BUN: 14 mg/dL (ref 6–20)
CO2: 33 mmol/L — ABNORMAL HIGH (ref 22–32)
Calcium: 9 mg/dL (ref 8.9–10.3)
Chloride: 105 mmol/L (ref 98–111)
Creatinine, Ser: 0.75 mg/dL (ref 0.61–1.24)
GFR calc Af Amer: >60 mL/min (ref >60)
GFR calc non Af Amer: >60 mL/min (ref >60)
Glucose, Bld: 99 mg/dL (ref 70–99)
Potassium: 3.6 mmol/L (ref 3.5–5.1)
Sodium: 145 mmol/L (ref 135–145)

## 2020-03-15 LAB — CBC
HCT: 35.6 % — ABNORMAL LOW (ref 39.0–52.0)
Hemoglobin: 10.9 g/dL — ABNORMAL LOW (ref 13.0–17.0)
MCH: 28.9 pg (ref 26.0–34.0)
MCHC: 30.6 g/dL (ref 30.0–36.0)
MCV: 94.4 fL (ref 80.0–100.0)
Platelets: 84 10*3/uL — ABNORMAL LOW (ref 150–400)
RBC: 3.77 MIL/uL — ABNORMAL LOW (ref 4.22–5.81)
RDW: 16.1 % — ABNORMAL HIGH (ref 11.5–15.5)
WBC: 5.5 10*3/uL (ref 4.0–10.5)
nRBC: 0 % (ref 0.0–0.2)

## 2020-03-15 LAB — GLUCOSE, CAPILLARY
Glucose-Capillary: 112 mg/dL — ABNORMAL HIGH (ref 70–99)
Glucose-Capillary: 100 mg/dL — ABNORMAL HIGH (ref 70–99)
Glucose-Capillary: 99 mg/dL (ref 70–99)
Glucose-Capillary: 69 mg/dL — ABNORMAL LOW (ref 70–99)
Glucose-Capillary: 67 mg/dL — ABNORMAL LOW (ref 70–99)
Glucose-Capillary: 80 mg/dL (ref 70–99)
Glucose-Capillary: 121 mg/dL — ABNORMAL HIGH (ref 70–99)

## 2020-03-15 MED ORDER — DEXTROSE 50 % IV SOLN
12.5000 g | INTRAVENOUS | Status: DC
  Filled 2020-03-15: qty 50

## 2020-03-15 NOTE — Progress Notes (Signed)
Patient ID: Thomas [name], male   DOB: 1971-01-21, 49 y.o.   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 He recently received a partial esophageal resection with spit fistula revision 06/2019. 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 Pt's initial surgeries were through Dr. Maude Leriche at [facility] however he currently follows with [facility] thoracic surgery.  -He has a high output GJ tube, recurrent admissions for hypernatremia (12/18/2019-12/22/2019; 12/30/2019-01/05/2020; 01/13/2020-01/18/2020; 01/27/2020-02/01/2020) presented with hypotension, systolic blood pressure around 70 on 03/12/2020.  In the ED, he was found to have sodium of 164.  He was started on IV fluids.  Assessment & Plan:  Recurrent hypernatremia Dehydration Hypovolemic shock -Has a high output state, loss of excessive amounts of fluids through his spit ostomy of esophagus from excessive p.o. intake -Also his intake of free water through his GJ tube is poor and unreliable, based on hospitalization at [facility] he was recommended to use 350 to 375 mL every 4 hours through his GJ tube, due to his esophageal surgery and spit fistula continues to have considerable amounts of PO water intake that does not get absorbed, patient admits to poor compliance with free water flushes -Sodium and blood pressure are improving -Dietitian consult, continued patient education regarding limited oral intake of liquids, started 1200 fluid restriction since all of this comes out to the spit fistula and eventually causes more fluid loss -Instructed on compliance with free water every 4 hours through his G-tube -Sodium improving, stop D5W infusion today, recheck BMP in a.m.  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 -He has a GJ tube for feeds and nutrition -Initial surgeries were done by Dr. Maude Leriche at [facility], currently follows with [facility] thoracic surgery  Severe patient calorie malnutrition -Patient appears very cachectic and frail.  Continue tube feeds.  Follow nutrition recommendations  BPH -Continue Proscar and Hytrin  Chronic pain -Continue current pain regimen  Generalized conditioning -PT eval  DVT prophylaxis: Lovenox Code Status: Full Family Communication: Discussed with patient's mother 4/8 Disposition Plan: Home tomorrow if sodium, blood pressure stable off IV fluids  Consultants: None  Procedures: None  Antimicrobials: Rocephin from 03/12/2020 onwards  Subjective: -Complains of pain everywhere, drinking excessive amounts of liquids, soda p.o., educated again regarding fluid restriction and importance of free water intake through his G-tube instead  Objective: Vitals:   03/14/20 2234 03/15/20 0510 03/15/20 0755 03/15/20 1514  BP: (!) 101/56 (!) 97/59  110/66  Pulse: (!) 55 (!) 55  65  Resp: 14 14 16 15   Temp: 97.9 F (36.6 C) 97.8 F (36.6 C)  98.9 F (37.2 C)  TempSrc: Oral Oral  Oral  SpO2: 100% 100%  98%  Weight:      Height:        Intake/Output Summary (Last 24 hours) at 03/15/2020 1628 Last data filed at 03/15/2020 1514 Gross per 24 hour  Intake 2840 ml  Output 14050 ml  Net -11210 ml   Filed Weights   03/12/20 0947 03/13/20 0043  Weight: 38.6 kg 37.9 kg    Examination: Gen: chronically ill extremely cachectic male,  sitting up in bed, AAOx3 HEENT: No JVD, spit fistula to his right upper chest wall with clear drainage Lungs: Decreased breath sounds the bases CVS: S1-S2, regular rate rhythm Abd: soft, Non tender, non distended, BS  present, G-tube noted Extremities: No edema Skin: no new rashes on exposed Psychiatry: Flat affect.    Data Reviewed: I have personally reviewed following labs and imaging studies  CBC: Recent Labs  Lab 03/12/20 0951 03/13/20 0057 03/14/20 0811 03/15/20 0153  WBC 9.0 7.0 4.9 5.5  NEUTROABS 6.2  --  2.8  --   HGB 15.9 14.1 11.4* 10.9*  HCT 54.8* 50.2 38.2* 35.6*  MCV 96.5 98.0 94.6 94.4  PLT 177 141* 88* 84*   Basic Metabolic Panel: Recent Labs  Lab 03/13/20 0057 03/13/20 0751 03/13/20 0804 03/14/20 0811 03/15/20 0153  NA 159* 153* 139 148* 145  K 3.3* 3.3* 3.5 3.5 3.6  CL 115* 111 105 105 105  CO2 33* 32 26 33* 33*  GLUCOSE 113* 130* 190* 102* 99  BUN 44* 38* 15 18 14   CREATININE 1.07 0.96 0.95 0.80 0.75  CALCIUM 9.9 9.1 8.8* 9.1 9.0  MG  --   --   --  2.3  --    GFR: Estimated Creatinine Clearance: 60.5 mL/min (by C-G formula based on SCr of 0.75 mg/dL). Liver Function Tests: Recent Labs  Lab 03/12/20 0951  AST 22  ALT 21  ALKPHOS 69  BILITOT 0.7  PROT 7.7  ALBUMIN 4.4   Recent Labs  Lab 03/12/20 0951  LIPASE 42   No results for input(s): AMMONIA in the last 168 hours. Coagulation Profile: No results for input(s): INR, PROTIME in the last 168 hours. Cardiac Enzymes: No results for input(s): CKTOTAL, CKMB, CKMBINDEX, TROPONINI in the last 168 hours. BNP (last 3 results) No results for input(s): PROBNP in the last 8760 hours. HbA1C: No results for input(s): HGBA1C in the last 72 hours. CBG: Recent Labs  Lab 03/14/20 1244 03/14/20 1749 03/15/20 0427 03/15/20 0825 03/15/20 1231  GLUCAP 109* 89 112* 100* 99   Lipid Profile: No results for input(s): CHOL, HDL, LDLCALC, TRIG, CHOLHDL, LDLDIRECT in the last 72 hours. Thyroid Function Tests: No results for input(s): TSH, T4TOTAL, FREET4, T3FREE, THYROIDAB in the last 72 hours. Anemia Panel: No results for input(s): VITAMINB12, FOLATE, FERRITIN, TIBC, IRON, RETICCTPCT in the last 72  hours. Sepsis Labs: No results for input(s): PROCALCITON, LATICACIDVEN in the last 168 hours.  Recent Results (from the past 240 hour(s))  Urine culture     Status: Abnormal   Collection Time: 03/12/20  9:51 AM   Specimen: Urine, Random  Result Value Ref Range Status   Specimen Description URINE, RANDOM  Final   Special Requests   Final    NONE Performed at [facility], [address], [place], [place] [address]    Culture (A)  Final    40,000 COLONIES/mL KLEBSIELLA PNEUMONIAE 20,000 COLONIES/mL METHICILLIN RESISTANT STAPHYLOCOCCUS AUREUS    Report Status 03/14/2020 FINAL  Final   Organism ID, Bacteria KLEBSIELLA PNEUMONIAE (A)  Final   Organism ID, Bacteria METHICILLIN RESISTANT STAPHYLOCOCCUS AUREUS (A)  Final      Susceptibility   Klebsiella pneumoniae - MIC*    AMPICILLIN >=32 RESISTANT Resistant     CEFAZOLIN <=4 SENSITIVE Sensitive     CEFTRIAXONE <=0.25 SENSITIVE Sensitive     CIPROFLOXACIN <=0.25 SENSITIVE Sensitive     GENTAMICIN <=1 SENSITIVE Sensitive     IMIPENEM <=0.25 SENSITIVE Sensitive  NITROFURANTOIN 64 INTERMEDIATE Intermediate     TRIMETH/SULFA <=20 SENSITIVE Sensitive     AMPICILLIN/SULBACTAM 8 SENSITIVE Sensitive     PIP/TAZO <=4 SENSITIVE Sensitive     * 40,000 COLONIES/mL KLEBSIELLA PNEUMONIAE   Methicillin resistant staphylococcus aureus - MIC*    CIPROFLOXACIN >=8 RESISTANT Resistant     GENTAMICIN <=0.5 SENSITIVE Sensitive     NITROFURANTOIN <=16 SENSITIVE Sensitive     OXACILLIN >=4 RESISTANT Resistant     TETRACYCLINE <=1 SENSITIVE Sensitive     VANCOMYCIN 1 SENSITIVE Sensitive     TRIMETH/SULFA <=10 SENSITIVE Sensitive     CLINDAMYCIN <=0.25 SENSITIVE Sensitive     RIFAMPIN <=0.5 SENSITIVE Sensitive     Inducible Clindamycin NEGATIVE Sensitive     * 20,000 COLONIES/mL METHICILLIN RESISTANT STAPHYLOCOCCUS AUREUS  SARS CORONAVIRUS 2 (TAT 6-24 HRS) Nasopharyngeal Nasopharyngeal Swab     Status: None   Collection Time: 03/12/20  2:25  PM   Specimen: Nasopharyngeal Swab  Result Value Ref Range Status   SARS Coronavirus 2 NEGATIVE NEGATIVE Final    Comment: (NOTE) SARS-CoV-2 target nucleic acids are NOT DETECTED. The SARS-CoV-2 RNA is generally detectable in upper and lower respiratory specimens during the acute phase of infection. Negative results do not preclude SARS-CoV-2 infection, do not rule out co-infections with other pathogens, and should not be used as the sole basis for treatment or other patient management decisions. Negative results must be combined with clinical observations, patient history, and epidemiological information. The expected result is Negative. Fact Sheet for Patients: SugarRoll.be Fact Sheet for Healthcare Providers: https://www.woods-mathews.com/ This test is not yet approved or cleared by the [place] FDA and  has been authorized for detection and/or diagnosis of SARS-CoV-2 by FDA under an Emergency Use Authorization (EUA). This EUA will remain  in effect (meaning this test can be used) for the duration of the COVID-19 declaration under Section 56 4(b)(1) of the Act, 21 U.S.C. section 360bbb-3(b)(1), unless the authorization is terminated or revoked sooner. Performed at [facility], [place] [address] [place], [place] [address]          Radiology Studies: No results found.      Scheduled Meds: . enoxaparin (LOVENOX) injection  30 mg Subcutaneous Q24H  . finasteride  5 mg Oral Daily  . free water  400 mL Per Tube Q4H  . lidocaine  1 patch Transdermal Daily  . terazosin  2 mg Per Tube QHS  . thiamine  100 mg Per Tube Daily  . traZODone  50 mg Per Tube QHS   Continuous Infusions: . feeding supplement (JEVITY 1.2 CAL) 1,000 mL (03/14/20 2047)    Domenic Polite, MD [facility] 03/15/2020, 4:28 PM

## 2020-03-15 NOTE — Progress Notes (Addendum)
RN rechecked BG after 69 result. Repeat BG 67. Patient asymptomatic. MD paged through secure chat and through [place] around [phone]. Awaiting response.

## 2020-03-15 NOTE — Plan of Care (Signed)
  Problem: Education: Goal: Knowledge of General Education information will improve Description: Including pain rating scale, medication(s)/side effects and non-pharmacologic comfort measures Outcome: Progressing   Problem: Health Behavior/Discharge Planning: Goal: Ability to manage health-related needs will improve Outcome: Progressing   Problem: Clinical Measurements: Goal: Ability to maintain clinical measurements within normal limits will improve Outcome: Progressing Goal: Will remain free from infection Outcome: Progressing Goal: Diagnostic test results will improve Outcome: Progressing Goal: Respiratory complications will improve Outcome: Progressing Goal: Cardiovascular complication will be avoided Outcome: Progressing   Problem: Activity: Goal: Risk for activity intolerance will decrease Outcome: Progressing   Problem: Nutrition: Goal: Adequate nutrition will be maintained Outcome: Progressing   Problem: Coping: Goal: Level of anxiety will decrease Outcome: Progressing   Problem: Pain Managment: Goal: General experience of comfort will improve Outcome: Progressing   

## 2020-03-15 NOTE — Plan of Care (Signed)

## 2020-03-16 LAB — GLUCOSE, CAPILLARY
Glucose-Capillary: 101 mg/dL — ABNORMAL HIGH (ref 70–99)
Glucose-Capillary: 120 mg/dL — ABNORMAL HIGH (ref 70–99)
Glucose-Capillary: 124 mg/dL — ABNORMAL HIGH (ref 70–99)
Glucose-Capillary: 98 mg/dL (ref 70–99)

## 2020-03-16 NOTE — TOC Progression Note (Signed)
Transition of Care [facility]) - Progression Note    Patient Details  Name: Thomas [name] MRN: [id number] Date of Birth: [birth year]  Transition of Care [facility]) CM/SW [place], [place] Phone Number: 03/16/2020, 12:16 PM  Clinical Narrative:    CSW spoke with pt's mother by phone.  Pt's mother stated " she is not able to care for pt without HH.  CSW consulted with Case Manager Caryl Pina concerning request.  Please see Case Manager note below.   Expected Discharge Plan: Home/Self Care Barriers to Discharge: Continued Medical Work up  Expected Discharge Plan and Services Expected Discharge Plan: Home/Self Care       Living arrangements for the past 2 months: Single Family Home Expected Discharge Date: 03/16/20                 DME Agency: [employer]       HH Arranged: NA           Social Determinants of Health (SDOH) Interventions    Readmission Risk Interventions No flowsheet data found.

## 2020-03-16 NOTE — Discharge Summary (Signed)
Thomas [name] [id number] DOB: 01/19/1971 DOA: 03/12/2020  PCP: [name], NP  Admit date: 03/12/2020 Discharge date: 03/16/2020  Admitted From: home Disposition:  home  Recommendations for Outpatient Follow-up:  1. Follow up with PCP in 1 week 2. Please obtain BMP/CBC in one week 3. F/u with primary RD in one week     Discharge Condition:Stable CODE STATUS:full Diet recommendation: continues with home TF regimen, will need additional 1336 ml total of free water flushes via tube (ex 225 ml free water flush every 4 hours, which will provides total of 1350 ml fluid and total of 2014 ml free water daily).    Brief/Interim Summary: Thomas [name] is a 49 y.o. 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   He reported that he is hurting and in pain in his back and legs and side.  He does not know why he is dehydrated.  He has chronic pain for which he takes chronic narcotics .  On arrival he was found with recurrent hyponatremia, Na++ 164.  This patient was started on IV fluids admitted to the hospital service.  Dietitian was consulted.  His tube feeding was continued here labs will monitor closely. He has been of IV fluids.  His sodium level today was 145.  He is mentating well.  He is stable to be discharged home.  He was hypotensive on admission but now has improved.  Recurrent hypernatremia Dehydration Hypovolemic shock -Has a high output state, loss of excessive amounts of fluids through his spit ostomy of esophagus from excessive p.o. intake -Also his intake of free water through his GJ tube is poor and unreliable, based on hospitalization at [facility] he was recommended to use 350 to 375 mL  every 4 hours through his GJ tube, due to his esophageal surgery and spit fistula continues to have considerable amounts of PO water intake that does not get absorbed, patient admits to poor compliance with free water flushes -Sodium and blood pressure are improving -Dietitian consulted, recommended above. F/u with primary RD within one week , discussed with mother and she verbalized an understanding.  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 -Has a GJ tube for feeds and nutrition -Initial surgeries were done by Dr. Maude Leriche at [facility], currently follows with [facility] thoracic surgery  Severe patient calorie malnutrition -Patient appears very cachectic and frail.  Continue tube feeds.  Follow nutrition recommendations  BPH -Continue Proscar and Hytrin  Chronic pain -Continue current pain regimen      Discharge Diagnoses:  Principal Problem:   Dehydration with hypernatremia Active Problems:   Hypernatremia   Protein-calorie malnutrition, severe   Esophageal atresia   Gastrostomy tube in place [facility])   Chronic pain syndrome    Discharge Instructions  Discharge Instructions    Diet - low sodium heart healthy   Complete by: As directed    Discharge instructions   Complete by: As directed    Need to take additional 1336 ml total of free water flushes via tube (ex 225 ml free water flush every 4 hours, which will provides total of 1350 ml fluid and total of 2014 ml free  water daily).  Follow up with your primary doctor in one week F/u with your dietition   Increase activity slowly   Complete by: As directed      Allergies as of 03/16/2020      Reactions   Aspirin Other (See Comments)   Burns stomach   Penicillins    Did it involve swelling of the  face/tongue/throat, SOB, or low BP? Unknown Did it involve sudden or severe rash/hives, skin peeling, or any reaction on the inside of your mouth or nose? Unknown Did you need to seek medical attention at a hospital or doctor's office? Unknown When did it last happen?patient refuses to take due to parents being allergic If all above answers are "NO", may proceed with cephalosporin use. Parents allergic    Vinegar [acetic Acid] Other (See Comments)   seizure      Medication List    TAKE these medications   Carboxymethylcellulose Sodium 0.25 % Soln Place 1 drop into both eyes 2 (two) times daily as needed (for dry eyes).   feeding supplement (JEVITY 1.2 CAL) Liqd Place 1,000 mLs into feeding tube continuous. 1,000 mL, Per Tube, at 65 mL/hr, Continuous   finasteride 5 MG tablet Commonly known as: PROSCAR 5 mg daily. Per tube   free water Soln Place 300 mLs into feeding tube every 4 (four) hours.   HYDROcodone-acetaminophen 7.5-325 mg/15 ml solution Commonly known as: HYCET Take 10 mLs by mouth every 6 (six) hours as needed (for chronic pain).   Salonpas 3.12-12-08 % Ptch Generic drug: Camphor-Menthol-Methyl Sal Apply 1 patch topically daily.   terazosin 2 MG capsule Commonly known as: HYTRIN Take 2 mg by mouth at bedtime.   thiamine 100 MG tablet Place 100 mg into feeding tube daily.   traZODone 50 MG tablet Commonly known as: DESYREL Place 1 tablet (50 mg total) into feeding tube at bedtime.   Zinc Oxide 12.8 % ointment Commonly known as: TRIPLE PASTE Apply topically 3 (three) times daily. Apply to abdominal, chest and back rash            Durable Medical Equipment  (From admission, onward)         Start     Ordered   03/16/20 1059  For home use only DME Tube feeding  Once    Comments: If pt continues with home TF regimen, will need additional 1336 ml total of free water flushes via tube (ex 225 ml free water flush every 4 hours, which will provides total of  1350 ml fluid and total of 2014 ml free water daily).   03/16/20 1059         Follow-up Information    [name], NP Follow up in 4 day(s).   Specialty: Nurse Practitioner Contact information: [place] [address] [phone]          Allergies  Allergen Reactions  . Aspirin Other (See Comments)    Burns stomach   . Penicillins     Did it involve swelling of the face/tongue/throat, SOB, or low BP? Unknown Did it involve sudden or severe rash/hives, skin peeling, or any reaction on the inside of your mouth or nose? Unknown Did you need to seek medical attention at a hospital or doctor's office? Unknown When did it last happen?patient refuses to take due to parents being allergic If all above answers are "NO", may proceed with cephalosporin use. Parents allergic   . Vinegar [Acetic Acid] Other (See Comments)    seizure  Consultations:  RD   Procedures/Studies: DG Chest Portable 1 View  Result Date: 03/12/2020 CLINICAL DATA:  Chest pain. EXAM: PORTABLE CHEST 1 VIEW COMPARISON:  CT 06/11/2019.  Chest x-ray 06/11/2019. FINDINGS: Mediastinum and hilar structures are stable. Prior colonic interposition. Stable atelectatic changes and or pleuroparenchymal scarring noted on the right. Stable metallic linear densities noted over the right chest. No pleural effusion or pneumothorax. No acute bony abnormality identified. IMPRESSION: 1. Mediastinum and hilar structures stable. Prior colonic interposition. 2. Stable atelectatic changes and or pleuroparenchymal scarring noted on the right. 3. Stable metallic linear densities noted over the right chest. No pneumothorax. Electronically Signed   By: Marcello Moores  Register   On: 03/12/2020 10:18   CT Renal Stone Study  Result Date: 03/12/2020 CLINICAL DATA:  Flank pain, kidney stones suspected EXAM: CT ABDOMEN AND PELVIS WITHOUT CONTRAST TECHNIQUE: Multidetector CT imaging of the abdomen and pelvis was performed  following the standard protocol without IV contrast. COMPARISON:  01/16/2019 FINDINGS: Lower chest: No acute abnormality. Hepatobiliary: No solid liver abnormality is seen. No gallstones, gallbladder wall thickening, or biliary dilatation. Pancreas: Unremarkable. No pancreatic ductal dilatation or surrounding inflammatory changes. Spleen: Normal in size without significant abnormality. Adrenals/Urinary Tract: Adrenal glands are unremarkable. Evaluation for urinary tract calculus is somewhat limited by extreme cachexia. Within this limitation, there is a new 3 mm calculus inferior to the right kidney in the expected vicinity of the proximal ureter (series 3, image 33). No other evidence of urinary tract calculus. No high-grade hydronephrosis. Thickening of the decompressed urinary bladder. Stomach/Bowel: Percutaneous gastrostomy tube. Large burden of stool dense stool balls throughout the colon and rectum. Postoperative findings of colonic interposition, partially imaged. Vascular/Lymphatic: Infrarenal IVC filter. No enlarged abdominal or pelvic lymph nodes. Reproductive: No mass or other significant abnormality. Other: No abdominal wall hernia or abnormality. No abdominopelvic ascites. Multiple dense calcifications throughout the abdomen and pelvis. Musculoskeletal: Extreme cachexia. IMPRESSION: 1. Evaluation for urinary tract calculus is somewhat limited by extreme cachexia, within this limitation, there is a new 3 mm calculus inferior to the right kidney in the expected vicinity of the proximal ureter. No other evidence of urinary tract calculus and no high-grade hydronephrosis. 2. Nonspecific thickening of the decompressed urinary bladder. Correlate with urinalysis. 3. Postoperative findings of the bowel, including of colonic interposition. Electronically Signed   By: Eddie Candle M.D.   On: 03/12/2020 13:18       Subjective: Has no complaints. Asking for more foods.  Discharge Exam: Vitals:   03/16/20  0435 03/16/20 1100  BP: (!) 105/59   Pulse: 64   Resp: 18   Temp:    SpO2: 100% 100%   Vitals:   03/15/20 1514 03/15/20 2033 03/16/20 0435 03/16/20 1100  BP: 110/66 98/62 (!) 105/59   Pulse: 65 70 64   Resp: 15 17 18    Temp: 98.9 F (37.2 C) 98.7 F (37.1 C)    TempSrc: Oral Oral    SpO2: 98% 99% 100% 100%  Weight:      Height:        General: Pt is alert, awake, not in acute distress Cardiovascular: RRR, S1/S2 +, no rubs, no gallops Respiratory: CTA bilaterally, no wheezing, no rhonchi GT in place Abdominal: Soft, NT, ND, bowel sounds + Extremities: no edema, no cyanosis    The results of significant diagnostics from this hospitalization (including imaging, microbiology, ancillary and laboratory) are listed below for reference.     Microbiology: Recent Results (from the past 240 hour(s))  Urine culture     Status: Abnormal   Collection Time: 03/12/20  9:51 AM   Specimen: Urine, Random  Result Value Ref Range Status   Specimen Description URINE, RANDOM  Final   Special Requests   Final    NONE Performed at [facility], [place] [address], [place], [place] [address]    Culture (A)  Final    40,000 COLONIES/mL KLEBSIELLA PNEUMONIAE 20,000 COLONIES/mL METHICILLIN RESISTANT STAPHYLOCOCCUS AUREUS    Report Status 03/14/2020 FINAL  Final   Organism ID, Bacteria KLEBSIELLA PNEUMONIAE (A)  Final   Organism ID, Bacteria METHICILLIN RESISTANT STAPHYLOCOCCUS AUREUS (A)  Final      Susceptibility   Klebsiella pneumoniae - MIC*    AMPICILLIN >=32 RESISTANT Resistant     CEFAZOLIN <=4 SENSITIVE Sensitive     CEFTRIAXONE <=0.25 SENSITIVE Sensitive     CIPROFLOXACIN <=0.25 SENSITIVE Sensitive     GENTAMICIN <=1 SENSITIVE Sensitive     IMIPENEM <=0.25 SENSITIVE Sensitive     NITROFURANTOIN 64 INTERMEDIATE Intermediate     TRIMETH/SULFA <=20 SENSITIVE Sensitive     AMPICILLIN/SULBACTAM 8 SENSITIVE Sensitive     PIP/TAZO <=4 SENSITIVE Sensitive     * 40,000 COLONIES/mL  KLEBSIELLA PNEUMONIAE   Methicillin resistant staphylococcus aureus - MIC*    CIPROFLOXACIN >=8 RESISTANT Resistant     GENTAMICIN <=0.5 SENSITIVE Sensitive     NITROFURANTOIN <=16 SENSITIVE Sensitive     OXACILLIN >=4 RESISTANT Resistant     TETRACYCLINE <=1 SENSITIVE Sensitive     VANCOMYCIN 1 SENSITIVE Sensitive     TRIMETH/SULFA <=10 SENSITIVE Sensitive     CLINDAMYCIN <=0.25 SENSITIVE Sensitive     RIFAMPIN <=0.5 SENSITIVE Sensitive     Inducible Clindamycin NEGATIVE Sensitive     * 20,000 COLONIES/mL METHICILLIN RESISTANT STAPHYLOCOCCUS AUREUS  SARS CORONAVIRUS 2 (TAT 6-24 HRS) Nasopharyngeal Nasopharyngeal Swab     Status: None   Collection Time: 03/12/20  2:25 PM   Specimen: Nasopharyngeal Swab  Result Value Ref Range Status   SARS Coronavirus 2 NEGATIVE NEGATIVE Final    Comment: (NOTE) SARS-CoV-2 target nucleic acids are NOT DETECTED. The SARS-CoV-2 RNA is generally detectable in upper and lower respiratory specimens during the acute phase of infection. Negative results do not preclude SARS-CoV-2 infection, do not rule out co-infections with other pathogens, and should not be used as the sole basis for treatment or other patient management decisions. Negative results must be combined with clinical observations, patient history, and epidemiological information. The expected result is Negative. Fact Sheet for Patients: SugarRoll.be Fact Sheet for Healthcare Providers: https://www.woods-mathews.com/ This test is not yet approved or cleared by the [place] FDA and  has been authorized for detection and/or diagnosis of SARS-CoV-2 by FDA under an Emergency Use Authorization (EUA). This EUA will remain  in effect (meaning this test can be used) for the duration of the COVID-19 declaration under Section 56 4(b)(1) of the Act, 21 U.S.C. section 360bbb-3(b)(1), unless the authorization is terminated or revoked sooner. Performed at  [facility], [place] [address], [place], [place] [address]      Labs: BNP (last 3 results) No results for input(s): BNP in the last 8760 hours. Basic Metabolic Panel: Recent Labs  Lab 03/13/20 0057 03/13/20 0751 03/13/20 0804 03/14/20 0811 03/15/20 0153  NA 159* 153* 139 148* 145  K 3.3* 3.3* 3.5 3.5 3.6  CL 115* 111 105 105 105  CO2 33* 32 26 33* 33*  GLUCOSE 113* 130* 190* 102* 99  BUN 44* 38* 15 18 14   CREATININE 1.07 0.96 0.95 0.80 0.75  CALCIUM 9.9 9.1 8.8* 9.1 9.0  MG  --   --   --  2.3  --    Liver Function Tests: Recent Labs  Lab 03/12/20 0951  AST 22  ALT 21  ALKPHOS 69  BILITOT 0.7  PROT 7.7  ALBUMIN 4.4   Recent Labs  Lab 03/12/20 0951  LIPASE 42   No results for input(s): AMMONIA in the last 168 hours. CBC: Recent Labs  Lab 03/12/20 0951 03/13/20 0057 03/14/20 0811 03/15/20 0153  WBC 9.0 7.0 4.9 5.5  NEUTROABS 6.2  --  2.8  --   HGB 15.9 14.1 11.4* 10.9*  HCT 54.8* 50.2 38.2* 35.6*  MCV 96.5 98.0 94.6 94.4  PLT 177 141* 88* 84*   Cardiac Enzymes: No results for input(s): CKTOTAL, CKMB, CKMBINDEX, TROPONINI in the last 168 hours. BNP: Invalid input(s): POCBNP CBG: Recent Labs  Lab 03/15/20 2033 03/16/20 0017 03/16/20 0433 03/16/20 0744 03/16/20 1140  GLUCAP 121* 98 124* 120* 101*   D-Dimer No results for input(s): DDIMER in the last 72 hours. Hgb A1c No results for input(s): HGBA1C in the last 72 hours. Lipid Profile No results for input(s): CHOL, HDL, LDLCALC, TRIG, CHOLHDL, LDLDIRECT in the last 72 hours. Thyroid function studies No results for input(s): TSH, T4TOTAL, T3FREE, THYROIDAB in the last 72 hours.  Invalid input(s): FREET3 Anemia work up No results for input(s): VITAMINB12, FOLATE, FERRITIN, TIBC, IRON, RETICCTPCT in the last 72 hours. Urinalysis    Component Value Date/Time   COLORURINE YELLOW 03/12/2020 1055   APPEARANCEUR CLEAR 03/12/2020 1055   LABSPEC 1.025 03/12/2020 1055   PHURINE 5.0 03/12/2020  1055   GLUCOSEU NEGATIVE 03/12/2020 1055   HGBUR SMALL (A) 03/12/2020 1055   BILIRUBINUR NEGATIVE 03/12/2020 1055   KETONESUR NEGATIVE 03/12/2020 1055   PROTEINUR 30 (A) 03/12/2020 1055   UROBILINOGEN 0.2 07/26/2014 1325   NITRITE NEGATIVE 03/12/2020 1055   LEUKOCYTESUR SMALL (A) 03/12/2020 1055   Sepsis Labs Invalid input(s): PROCALCITONIN,  WBC,  LACTICIDVEN Microbiology Recent Results (from the past 240 hour(s))  Urine culture     Status: Abnormal   Collection Time: 03/12/20  9:51 AM   Specimen: Urine, Random  Result Value Ref Range Status   Specimen Description URINE, RANDOM  Final   Special Requests   Final    NONE Performed at [facility], [place] [address], [place], [place] [address]    Culture (A)  Final    40,000 COLONIES/mL KLEBSIELLA PNEUMONIAE 20,000 COLONIES/mL METHICILLIN RESISTANT STAPHYLOCOCCUS AUREUS    Report Status 03/14/2020 FINAL  Final   Organism ID, Bacteria KLEBSIELLA PNEUMONIAE (A)  Final   Organism ID, Bacteria METHICILLIN RESISTANT STAPHYLOCOCCUS AUREUS (A)  Final      Susceptibility   Klebsiella pneumoniae - MIC*    AMPICILLIN >=32 RESISTANT Resistant     CEFAZOLIN <=4 SENSITIVE Sensitive     CEFTRIAXONE <=0.25 SENSITIVE Sensitive     CIPROFLOXACIN <=0.25 SENSITIVE Sensitive     GENTAMICIN <=1 SENSITIVE Sensitive     IMIPENEM <=0.25 SENSITIVE Sensitive     NITROFURANTOIN 64 INTERMEDIATE Intermediate     TRIMETH/SULFA <=20 SENSITIVE Sensitive     AMPICILLIN/SULBACTAM 8 SENSITIVE Sensitive     PIP/TAZO <=4 SENSITIVE Sensitive     * 40,000 COLONIES/mL KLEBSIELLA PNEUMONIAE   Methicillin resistant staphylococcus aureus - MIC*    CIPROFLOXACIN >=8 RESISTANT Resistant     GENTAMICIN <=0.5 SENSITIVE Sensitive  NITROFURANTOIN <=16 SENSITIVE Sensitive     OXACILLIN >=4 RESISTANT Resistant     TETRACYCLINE <=1 SENSITIVE Sensitive     VANCOMYCIN 1 SENSITIVE Sensitive     TRIMETH/SULFA <=10 SENSITIVE Sensitive     CLINDAMYCIN <=0.25 SENSITIVE  Sensitive     RIFAMPIN <=0.5 SENSITIVE Sensitive     Inducible Clindamycin NEGATIVE Sensitive     * 20,000 COLONIES/mL METHICILLIN RESISTANT STAPHYLOCOCCUS AUREUS  SARS CORONAVIRUS 2 (TAT 6-24 HRS) Nasopharyngeal Nasopharyngeal Swab     Status: None   Collection Time: 03/12/20  2:25 PM   Specimen: Nasopharyngeal Swab  Result Value Ref Range Status   SARS Coronavirus 2 NEGATIVE NEGATIVE Final    Comment: (NOTE) SARS-CoV-2 target nucleic acids are NOT DETECTED. The SARS-CoV-2 RNA is generally detectable in upper and lower respiratory specimens during the acute phase of infection. Negative results do not preclude SARS-CoV-2 infection, do not rule out co-infections with other pathogens, and should not be used as the sole basis for treatment or other patient management decisions. Negative results must be combined with clinical observations, patient history, and epidemiological information. The expected result is Negative. Fact Sheet for Patients: SugarRoll.be Fact Sheet for Healthcare Providers: https://www.woods-mathews.com/ This test is not yet approved or cleared by the [place] FDA and  has been authorized for detection and/or diagnosis of SARS-CoV-2 by FDA under an Emergency Use Authorization (EUA). This EUA will remain  in effect (meaning this test can be used) for the duration of the COVID-19 declaration under Section 56 4(b)(1) of the Act, 21 U.S.C. section 360bbb-3(b)(1), unless the authorization is terminated or revoked sooner. Performed at [facility], [place] [address] [place], [place] [address]      Time coordinating discharge: Over 30 minutes  SIGNED:   Nolberto Hanlon, MD  [facility] 03/16/2020, 4:10 PM Pager   If 7PM-7AM, please contact night-coverage [email or web address] Password [email or web address]

## 2020-03-16 NOTE — Plan of Care (Signed)

## 2020-03-16 NOTE — TOC Transition Note (Signed)
Transition of Care [facility]) - CM/SW Discharge Note   Patient Details  Name: Thomas [name] MRN: [id number] Date of Birth: 1971-08-01  Transition of Care [facility]) CM/SW Contact:  Claudie Leach, RN 03/16/2020, 11:31 AM   Clinical Narrative:    Patient to d/c home to mother's house today.  Patient confirms he has [employer], Boost, and other necessary supplies at home but will need another delivery from [place] soon.  Patient has not had [facility] services in some time, since going to stay with mother.  No HH services are available to him. He sees RD as an outpatient.    [place] with [employer] will arrange delivery of TF supplies.    Final next level of care: Home/Self Care Barriers to Discharge: Continued Medical Work up   Patient Goals and CMS Choice Patient states their goals for this hospitalization and ongoing recovery are:: to return to mother's address CMS Medicare.gov Compare Post Acute Care list provided to:: Patient     Discharge Plan and Services                  DME Agency: [employer]       [facility] Arranged: NA

## 2020-03-27 ENCOUNTER — Ambulatory Visit: Payer: Medicaid Other

## 2020-03-30 ENCOUNTER — Other Ambulatory Visit: Payer: Self-pay

## 2020-03-30 ENCOUNTER — Inpatient Hospital Stay
Admission: EM | Admit: 2020-03-30 | Discharge: 2020-04-02 | DRG: 393 | Disposition: A | Discharge status: Home Health | Payer: Medicaid Other | Attending: Internal Medicine | Admitting: Internal Medicine

## 2020-03-30 ENCOUNTER — Encounter: Payer: Self-pay | Admitting: Emergency Medicine

## 2020-03-30 ENCOUNTER — Emergency Department: Payer: Medicaid Other

## 2020-03-30 DIAGNOSIS — E860 Dehydration: Secondary | ICD-10-CM

## 2020-03-30 DIAGNOSIS — E43 Unspecified severe protein-calorie malnutrition: Secondary | ICD-10-CM | POA: Diagnosis present

## 2020-03-30 DIAGNOSIS — Z8249 Family history of ischemic heart disease and other diseases of the circulatory system: Secondary | ICD-10-CM

## 2020-03-30 DIAGNOSIS — Z811 Family history of alcohol abuse and dependence: Secondary | ICD-10-CM

## 2020-03-30 DIAGNOSIS — Q390 Atresia of esophagus without fistula: Secondary | ICD-10-CM

## 2020-03-30 DIAGNOSIS — F1010 Alcohol abuse, uncomplicated: Secondary | ICD-10-CM | POA: Diagnosis present

## 2020-03-30 DIAGNOSIS — N179 Acute kidney failure, unspecified: Secondary | ICD-10-CM | POA: Diagnosis present

## 2020-03-30 DIAGNOSIS — K9423 Gastrostomy malfunction: Principal | ICD-10-CM

## 2020-03-30 DIAGNOSIS — K219 Gastro-esophageal reflux disease without esophagitis: Secondary | ICD-10-CM | POA: Diagnosis present

## 2020-03-30 DIAGNOSIS — Z681 Body mass index (BMI) 19 or less, adult: Secondary | ICD-10-CM

## 2020-03-30 DIAGNOSIS — E870 Hyperosmolality and hypernatremia: Secondary | ICD-10-CM

## 2020-03-30 DIAGNOSIS — Z87891 Personal history of nicotine dependence: Secondary | ICD-10-CM

## 2020-03-30 DIAGNOSIS — K2210 Ulcer of esophagus without bleeding: Secondary | ICD-10-CM

## 2020-03-30 DIAGNOSIS — Z881 Allergy status to other antibiotic agents status: Secondary | ICD-10-CM

## 2020-03-30 DIAGNOSIS — R64 Cachexia: Secondary | ICD-10-CM | POA: Diagnosis present

## 2020-03-30 DIAGNOSIS — Z9119 Patient's noncompliance with other medical treatment and regimen: Secondary | ICD-10-CM

## 2020-03-30 DIAGNOSIS — Z20822 Contact with and (suspected) exposure to covid-19: Secondary | ICD-10-CM | POA: Diagnosis present

## 2020-03-30 DIAGNOSIS — E875 Hyperkalemia: Secondary | ICD-10-CM | POA: Diagnosis present

## 2020-03-30 DIAGNOSIS — Z886 Allergy status to analgesic agent status: Secondary | ICD-10-CM

## 2020-03-30 DIAGNOSIS — F419 Anxiety disorder, unspecified: Secondary | ICD-10-CM | POA: Diagnosis present

## 2020-03-30 DIAGNOSIS — Z888 Allergy status to other drugs, medicaments and biological substances status: Secondary | ICD-10-CM

## 2020-03-30 DIAGNOSIS — G894 Chronic pain syndrome: Secondary | ICD-10-CM | POA: Diagnosis present

## 2020-03-30 LAB — COMPREHENSIVE METABOLIC PANEL
ALT: 31 U/L (ref 0–44)
AST: 23 U/L (ref 15–41)
Albumin: 5 g/dL (ref 3.5–5.0)
Alkaline Phosphatase: 69 U/L (ref 38–126)
Anion gap: 17 — ABNORMAL HIGH (ref 5–15)
BUN: 71 mg/dL — ABNORMAL HIGH (ref 6–20)
CO2: 32 mmol/L (ref 22–32)
Calcium: 11.1 mg/dL — ABNORMAL HIGH (ref 8.9–10.3)
Chloride: 115 mmol/L — ABNORMAL HIGH (ref 98–111)
Creatinine, Ser: 1.42 mg/dL — ABNORMAL HIGH (ref 0.61–1.24)
GFR calc Af Amer: >60 mL/min (ref >60)
GFR calc non Af Amer: 58 mL/min — ABNORMAL LOW (ref >60)
Glucose, Bld: 119 mg/dL — ABNORMAL HIGH (ref 70–99)
Potassium: 4.6 mmol/L (ref 3.5–5.1)
Sodium: 164 mmol/L (ref 135–145)
Total Bilirubin: 1 mg/dL (ref 0.3–1.2)
Total Protein: 8.5 g/dL — ABNORMAL HIGH (ref 6.5–8.1)

## 2020-03-30 LAB — CBC WITH DIFFERENTIAL/PLATELET
Abs Immature Granulocytes: 0.03 10*3/uL (ref 0.00–0.07)
Basophils Absolute: 0.1 10*3/uL (ref 0.0–0.1)
Basophils Relative: 1 %
Eosinophils Absolute: 0 10*3/uL (ref 0.0–0.5)
Eosinophils Relative: 0 %
HCT: 54.5 % — ABNORMAL HIGH (ref 39.0–52.0)
Hemoglobin: 16.3 g/dL (ref 13.0–17.0)
Immature Granulocytes: 0 %
Lymphocytes Relative: 24 %
Lymphs Abs: 2.4 10*3/uL (ref 0.7–4.0)
MCH: 28.4 pg (ref 26.0–34.0)
MCHC: 29.9 g/dL — ABNORMAL LOW (ref 30.0–36.0)
MCV: 94.9 fL (ref 80.0–100.0)
Monocytes Absolute: 0.6 10*3/uL (ref 0.1–1.0)
Monocytes Relative: 6 %
Neutro Abs: 6.6 10*3/uL (ref 1.7–7.7)
Neutrophils Relative %: 69 %
Platelets: 253 10*3/uL (ref 150–400)
RBC: 5.74 MIL/uL (ref 4.22–5.81)
RDW: 18.5 % — ABNORMAL HIGH (ref 11.5–15.5)
WBC: 9.7 10*3/uL (ref 4.0–10.5)
nRBC: 0 % (ref 0.0–0.2)

## 2020-03-30 LAB — RESPIRATORY PANEL BY RT PCR (FLU A&B, COVID)
Influenza A by PCR: NEGATIVE
Influenza B by PCR: NEGATIVE
SARS Coronavirus 2 by RT PCR: NEGATIVE

## 2020-03-30 LAB — TROPONIN I (HIGH SENSITIVITY): Troponin I (High Sensitivity): 3 ng/L (ref <18)

## 2020-03-30 MED ORDER — DIPHENHYDRAMINE HCL 50 MG/ML IJ SOLN
INTRAMUSCULAR | Status: AC
  Administered 2020-03-30: 25 mg via INTRAVENOUS
  Filled 2020-03-30: qty 1

## 2020-03-30 MED ORDER — DEXTROSE 5 % IV SOLN: Freq: Once | INTRAVENOUS | Status: AC

## 2020-03-30 MED ORDER — MORPHINE SULFATE (PF) 4 MG/ML IV SOLN
4.0000 mg | Freq: Once | INTRAVENOUS | Status: AC
  Administered 2020-03-30: 23:00:00 4 mg via INTRAVENOUS
  Filled 2020-03-30: qty 1

## 2020-03-30 MED ORDER — DIPHENHYDRAMINE HCL 50 MG/ML IJ SOLN: 25.0000 mg | Freq: Once | INTRAMUSCULAR | Status: AC

## 2020-03-30 NOTE — ED Notes (Signed)
Pt given ice chips, tv remote, lemon mouth swabs, mint moisturizer  Chest "ostomy bag" lowered to below bed but not before pt squeezed bag and caused leak at right upper chest, this I cleaned and attempted to reattach
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
## 2020-03-30 NOTE — ED Notes (Signed)
1 lpm [place] added for pt comfort

## 2020-03-30 NOTE — ED Triage Notes (Signed)
Patient presents to Emergency Department via [facility] from home with complaints of trouble breathing since 1800  Pt has chest drain since July 2020 for unknown reason

## 2020-03-30 NOTE — ED Notes (Signed)
Pt c/o burning at right [facility] att, EDP at bedside, pt denies previous reaction, pt has redness approx 6 in dia from IV site, patency confirmed   Orders received

## 2020-03-30 NOTE — ED Notes (Signed)
CRITICAL LAB: sodium is 164, [facility], Dr. Jimmye Norman notified, orders received

## 2020-03-31 ENCOUNTER — Encounter: Payer: Self-pay | Admitting: Internal Medicine

## 2020-03-31 DIAGNOSIS — Q390 Atresia of esophagus without fistula: Secondary | ICD-10-CM | POA: Diagnosis not present

## 2020-03-31 DIAGNOSIS — K219 Gastro-esophageal reflux disease without esophagitis: Secondary | ICD-10-CM | POA: Diagnosis present

## 2020-03-31 DIAGNOSIS — Z9119 Patient's noncompliance with other medical treatment and regimen: Secondary | ICD-10-CM | POA: Diagnosis not present

## 2020-03-31 DIAGNOSIS — Z886 Allergy status to analgesic agent status: Secondary | ICD-10-CM | POA: Diagnosis not present

## 2020-03-31 DIAGNOSIS — R0602 Shortness of breath: Secondary | ICD-10-CM | POA: Diagnosis present

## 2020-03-31 DIAGNOSIS — Z881 Allergy status to other antibiotic agents status: Secondary | ICD-10-CM | POA: Diagnosis not present

## 2020-03-31 DIAGNOSIS — E43 Unspecified severe protein-calorie malnutrition: Secondary | ICD-10-CM

## 2020-03-31 DIAGNOSIS — Z20822 Contact with and (suspected) exposure to covid-19: Secondary | ICD-10-CM | POA: Diagnosis present

## 2020-03-31 DIAGNOSIS — F419 Anxiety disorder, unspecified: Secondary | ICD-10-CM | POA: Diagnosis present

## 2020-03-31 DIAGNOSIS — Z811 Family history of alcohol abuse and dependence: Secondary | ICD-10-CM | POA: Diagnosis not present

## 2020-03-31 DIAGNOSIS — F1010 Alcohol abuse, uncomplicated: Secondary | ICD-10-CM | POA: Diagnosis present

## 2020-03-31 DIAGNOSIS — Z681 Body mass index (BMI) 19 or less, adult: Secondary | ICD-10-CM | POA: Diagnosis not present

## 2020-03-31 DIAGNOSIS — K9423 Gastrostomy malfunction: Secondary | ICD-10-CM | POA: Diagnosis present

## 2020-03-31 DIAGNOSIS — E860 Dehydration: Secondary | ICD-10-CM | POA: Diagnosis present

## 2020-03-31 DIAGNOSIS — E870 Hyperosmolality and hypernatremia: Secondary | ICD-10-CM | POA: Diagnosis present

## 2020-03-31 DIAGNOSIS — Z87891 Personal history of nicotine dependence: Secondary | ICD-10-CM | POA: Diagnosis not present

## 2020-03-31 DIAGNOSIS — G894 Chronic pain syndrome: Secondary | ICD-10-CM | POA: Diagnosis present

## 2020-03-31 DIAGNOSIS — Z8249 Family history of ischemic heart disease and other diseases of the circulatory system: Secondary | ICD-10-CM | POA: Diagnosis not present

## 2020-03-31 DIAGNOSIS — Z888 Allergy status to other drugs, medicaments and biological substances status: Secondary | ICD-10-CM | POA: Diagnosis not present

## 2020-03-31 DIAGNOSIS — R64 Cachexia: Secondary | ICD-10-CM | POA: Diagnosis present

## 2020-03-31 DIAGNOSIS — E875 Hyperkalemia: Secondary | ICD-10-CM | POA: Diagnosis present

## 2020-03-31 DIAGNOSIS — N179 Acute kidney failure, unspecified: Secondary | ICD-10-CM | POA: Diagnosis present

## 2020-03-31 LAB — BASIC METABOLIC PANEL
Anion gap: 12 (ref 5–15)
BUN: 70 mg/dL — ABNORMAL HIGH (ref 6–20)
CO2: 33 mmol/L — ABNORMAL HIGH (ref 22–32)
Calcium: 10.6 mg/dL — ABNORMAL HIGH (ref 8.9–10.3)
Chloride: 114 mmol/L — ABNORMAL HIGH (ref 98–111)
Creatinine, Ser: 1.41 mg/dL — ABNORMAL HIGH (ref 0.61–1.24)
GFR calc Af Amer: >60 mL/min (ref >60)
GFR calc non Af Amer: 58 mL/min — ABNORMAL LOW (ref >60)
Glucose, Bld: 142 mg/dL — ABNORMAL HIGH (ref 70–99)
Potassium: 5.2 mmol/L — ABNORMAL HIGH (ref 3.5–5.1)
Sodium: 159 mmol/L — ABNORMAL HIGH (ref 135–145)

## 2020-03-31 LAB — URINALYSIS, COMPLETE (UACMP) WITH MICROSCOPIC
Glucose, UA: NEGATIVE mg/dL
Ketones, ur: NEGATIVE mg/dL
Leukocytes,Ua: NEGATIVE
Nitrite: NEGATIVE
Protein, ur: >300 mg/dL — AB
RBC / HPF: >50 RBC/hpf (ref 0–5)
Specific Gravity, Urine: >1.03 — ABNORMAL HIGH (ref 1.005–1.030)
Squamous Epithelial / HPF: NONE SEEN (ref 0–5)
pH: 5.5 (ref 5.0–8.0)

## 2020-03-31 LAB — CBC
HCT: 50.4 % (ref 39.0–52.0)
Hemoglobin: 14.8 g/dL (ref 13.0–17.0)
MCH: 28.7 pg (ref 26.0–34.0)
MCHC: 29.4 g/dL — ABNORMAL LOW (ref 30.0–36.0)
MCV: 97.7 fL (ref 80.0–100.0)
Platelets: 190 10*3/uL (ref 150–400)
RBC: 5.16 MIL/uL (ref 4.22–5.81)
RDW: 17.6 % — ABNORMAL HIGH (ref 11.5–15.5)
WBC: 9.7 10*3/uL (ref 4.0–10.5)
nRBC: 0 % (ref 0.0–0.2)

## 2020-03-31 LAB — PHOSPHORUS: Phosphorus: 5.1 mg/dL — ABNORMAL HIGH (ref 2.5–4.6)

## 2020-03-31 LAB — MAGNESIUM: Magnesium: 3.2 mg/dL — ABNORMAL HIGH (ref 1.7–2.4)

## 2020-03-31 MED ORDER — FREE WATER
300.0000 mL | Status: DC
  Administered 2020-03-31 – 2020-04-01 (×8): 300 mL
  Filled 2020-03-31 (×3): qty 300

## 2020-03-31 MED ORDER — LIDOCAINE 5 % EX PTCH
1.0000 | MEDICATED_PATCH | Freq: Every day | CUTANEOUS | Status: DC
  Administered 2020-03-31 – 2020-04-02 (×3): 1 via TRANSDERMAL
  Filled 2020-03-31 (×3): qty 1

## 2020-03-31 MED ORDER — GABAPENTIN 100 MG PO CAPS
100.0000 mg | ORAL_CAPSULE | Freq: Two times a day (BID) | ORAL | Status: DC
  Administered 2020-03-31 – 2020-04-02 (×5): 100 mg via ORAL
  Filled 2020-03-31 (×5): qty 1

## 2020-03-31 MED ORDER — ACETAMINOPHEN 500 MG PO TABS: 1000.0000 mg | ORAL_TABLET | Freq: Three times a day (TID) | ORAL | Status: DC | PRN

## 2020-03-31 MED ORDER — CAMPHOR-MENTHOL-METHYL SAL 3.1-6-10 % EX PTCH: 1.0000 | MEDICATED_PATCH | Freq: Every day | CUTANEOUS | Status: DC

## 2020-03-31 MED ORDER — ENOXAPARIN SODIUM 30 MG/0.3ML ~~LOC~~ SOLN
30.0000 mg | SUBCUTANEOUS | Status: DC
  Filled 2020-03-31 (×3): qty 0.3

## 2020-03-31 MED ORDER — FINASTERIDE 5 MG PO TABS
5.0000 mg | ORAL_TABLET | Freq: Every day | ORAL | Status: DC
  Administered 2020-03-31 – 2020-04-02 (×3): 5 mg via ORAL
  Filled 2020-03-31 (×4): qty 1

## 2020-03-31 MED ORDER — ACETAMINOPHEN 160 MG/5ML PO SOLN
1000.0000 mg | Freq: Three times a day (TID) | ORAL | Status: DC | PRN
  Administered 2020-03-31 – 2020-04-02 (×5): 1000 mg
  Filled 2020-03-31 (×7): qty 40.6

## 2020-03-31 MED ORDER — TRAZODONE HCL 50 MG PO TABS
50.0000 mg | ORAL_TABLET | Freq: Every day | ORAL | Status: DC
  Administered 2020-03-31 – 2020-04-01 (×2): 50 mg
  Filled 2020-03-31 (×2): qty 1

## 2020-03-31 MED ORDER — JEVITY 1.2 CAL PO LIQD
1000.0000 mL | ORAL | Status: DC
  Administered 2020-04-01: 1000 mL

## 2020-03-31 MED ORDER — ZINC OXIDE 11.3 % EX CREA: TOPICAL_CREAM | CUTANEOUS | Status: DC | PRN

## 2020-03-31 MED ORDER — HYDROCODONE-ACETAMINOPHEN 7.5-325 MG/15ML PO SOLN: 10.0000 mL | Freq: Four times a day (QID) | ORAL | Status: DC | PRN

## 2020-03-31 MED ORDER — SODIUM POLYSTYRENE SULFONATE 15 GM/60ML PO SUSP
15.0000 g | Freq: Once | ORAL | Status: AC
  Administered 2020-03-31: 09:00:00 15 g
  Filled 2020-03-31: qty 60

## 2020-03-31 MED ORDER — DEXTROSE 5 % IV SOLN: INTRAVENOUS | Status: DC

## 2020-03-31 MED ORDER — HYDROCODONE-ACETAMINOPHEN 7.5-325 MG/15ML PO SOLN
10.0000 mL | Freq: Four times a day (QID) | ORAL | Status: DC | PRN
  Administered 2020-03-31 – 2020-04-02 (×8): 10 mL
  Filled 2020-03-31 (×9): qty 15

## 2020-03-31 MED ORDER — POLYVINYL ALCOHOL 1.4 % OP SOLN
1.0000 [drp] | OPHTHALMIC | Status: DC | PRN
  Administered 2020-03-31 – 2020-04-02 (×5): 1 [drp] via OPHTHALMIC
  Filled 2020-03-31: qty 15

## 2020-03-31 MED ORDER — ENOXAPARIN SODIUM 40 MG/0.4ML ~~LOC~~ SOLN: 40.0000 mg | SUBCUTANEOUS | Status: DC

## 2020-03-31 MED ORDER — ZINC OXIDE 40 % EX OINT
TOPICAL_OINTMENT | Freq: Three times a day (TID) | CUTANEOUS | Status: AC
  Filled 2020-03-31: qty 113

## 2020-03-31 MED ORDER — POLYETHYLENE GLYCOL 3350 17 G PO PACK: 17.0000 g | PACK | Freq: Every day | ORAL | Status: DC | PRN

## 2020-03-31 MED ORDER — TERAZOSIN HCL 2 MG PO CAPS: 2.0000 mg | ORAL_CAPSULE | Freq: Every day | ORAL | Status: DC

## 2020-03-31 MED ORDER — THIAMINE HCL 100 MG PO TABS
100.0000 mg | ORAL_TABLET | Freq: Every day | ORAL | Status: DC
  Administered 2020-03-31 – 2020-04-02 (×3): 100 mg
  Filled 2020-03-31 (×3): qty 1
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
## 2020-03-31 NOTE — Progress Notes (Signed)
MD has been notified that PEG tube continues to leak around insertion site. Dr. Allen Norris has patted the area around the PEG tube to help hold the PEG tube in place and prevent it from leaking around the tube. Tube feeds continue to leak around. New multiple split gauze dressings have been applied around tube as recommended by GI provider. Hospitalist indicates IR will likely need to be consulted as suggested per Dr. Allen Norris if leaking continues around PEG tube. The hospitalist will notify the following providers about possible IR consult tomorrow. RN to notify next shift RN and provider about IR needing to be consulted for tomorrow. Tube feeds to be continued for now.

## 2020-03-31 NOTE — Progress Notes (Signed)
PHARMACIST - PHYSICIAN COMMUNICATION  CONCERNING:  Enoxaparin (Lovenox) for DVT Prophylaxis    RECOMMENDATION: Patient was prescribed enoxaprin 40mg  q24 hours for VTE prophylaxis.   Filed Weights   03/30/20 2053 03/31/20 0326  Weight: 38.6 kg (85 lb) 34.4 kg (75 lb 13.4 oz)    Body mass index is 13.02 kg/m.  Estimated Creatinine Clearance: 31.2 mL/min (A) (by C-G formula based on SCr of 1.41 mg/dL (H)).  Patient is candidate for enoxaparin 30mg  every 24 hours based on Weight less then 57kg for men   DESCRIPTION: Pharmacy has adjusted enoxaparin dose per [facility] policy.  Patient is now receiving enoxaparin 30mg  every 24 hours.  Dallie Piles, PharmD Clinical Pharmacist  03/31/2020 7:36 AM

## 2020-03-31 NOTE — Progress Notes (Signed)
Spoke with [name] in [place] regarding delivery of Jevity.

## 2020-03-31 NOTE — Consult Note (Addendum)
[place] nurse consulted for malfunction of PEG tube and care.  WOC can give guidance on peristomal skin care and tube stabilization however function of the tube would need to be evaluated by IR or GI MD to determine if tube is patent or positioned correctly.  [place] nurse will contact bedside nurse to guide in peristomal skin care if needed.    Re consult if needed, will not follow at this time. Thanks  [place] MSN, RN,CWOCN, [place], [place] [phone])  1310; contacted bedside nurse. Instructions for tube stabilization given with silicone foam.  Topical tx with zinc paste for skin irritation.   [place], [place], [place]

## 2020-03-31 NOTE — Progress Notes (Addendum)
PROGRESS NOTE    Thomas [name]  KYH:[id number] DOB: 07/17/1971 DOA: 03/30/2020 PCP: [name], NP (Confirm with patient/family/NH records and if not entered, this HAS to be entered at [facility] point of entry. 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  He had a recurrent malfunction in his tube feeding pump, but PEG tube was functional at this time.  He has not been receiving any tube feeding for the last 2 days.  He presents to the emergency room complaining of shortness of breath, he has extreme hyponatremia with sodium 164. Upon arrival in the emergency room, patient was given D5 water, tube feeding was restarted.   Assessment & Plan:   Active Problems:   Protein-calorie malnutrition, severe   Esophageal atresia   Dehydration with hypernatremia   Chronic pain syndrome  #1.  Severe hyponatremia. Secondary to dehydration, due to malfunction of tube feeding system.  Patient had multiple admission to the hospital with the same problem, will consult social worker to assess home condition.  I will continue D5 water as well as tube feeding.  Check BMP tomorrow.  #2.  Severe calorie malnutrition. Surprisingly, patient has a normal albumin.  He is cachectic, will continue tube feeding.  We will also obtain nutrition consult.  3.  Acute kidney injury. Secondary to dehydration.  Continue IV fluids.  Recheck a BMP tomorrow.  4.  Esophageal atresia. Follow.  5.  Mild hyperkalemia. Give Kayexalate via feeding tube.   DVT prophylaxis: Lovenox Code Status: Full Family Communication: Plan discussed with the patient, all questions answered. Disposition Plan:  . Patient came from: Home            . Anticipated d/c place: home . Barriers to d/c OR  conditions which need to be met to effect a safe d/c:   Consultants:   Social service  Procedures: None Antimicrobials:None  Subjective: Patient is still on oxygen.  Shortness of breath has resolved.  No cough. Restarted tube feeding, tolerating well. No nausea vomiting or diarrhea. No fever chills.  Objective: Vitals:   03/31/20 0230 03/31/20 0326 03/31/20 0328 03/31/20 0334  BP: 114/81 102/62    Pulse: 84 (!) 42 92 75  Resp: 16 20    Temp:  97.6 F (36.4 C)    TempSrc:      SpO2: 100% 100% (!) 86% 100%  Weight:  34.4 kg    Height:  5' 4"  (1.626 m)      Intake/Output Summary (Last 24 hours) at 03/31/2020 0820 Last data filed at 03/31/2020 1517 Gross per 24 hour  Intake --  Output 2400 ml  Net -2400 ml   Filed Weights   03/30/20 2053 03/31/20 0326  Weight: 38.6 kg 34.4 kg    Examination:  General exam: Cachectic, severely malnurished Respiratory system: Clear to auscultation. Respiratory effort normal. Cardiovascular system: S1 & S2 heard, RRR. No JVD, murmurs, rubs, gallops or clicks. No pedal edema. Gastrointestinal system: Abdomen is nondistended, soft and nontender. No organomegaly or masses felt. Normal bowel sounds heard. Central nervous system: Alert and oriented. No focal neurological deficits. Extremities: Symmetric  Skin: No rashes, lesions or ulcers Psychiatry:  Mood & affect appropriate.     Data Reviewed: I have personally  reviewed following labs and imaging studies  CBC: Recent Labs  Lab 03/30/20 2107 03/31/20 0529  WBC 9.7 9.7  NEUTROABS 6.6  --   HGB 16.3 14.8  HCT 54.5* 50.4  MCV 94.9 97.7  PLT 253 157   Basic Metabolic Panel: Recent Labs  Lab 03/30/20 2107 03/31/20 0529  NA 164* 159*  K 4.6 5.2*  CL 115* 114*  CO2 32 33*  GLUCOSE 119* 142*  BUN 71* 70*  CREATININE 1.42* 1.41*  CALCIUM 11.1* 10.6*  MG  --  3.2*  PHOS  --  5.1*   GFR: Estimated Creatinine Clearance: 31.2 mL/min (A) (by C-G formula based on SCr of 1.41  mg/dL (H)). Liver Function Tests: Recent Labs  Lab 03/30/20 2107  AST 23  ALT 31  ALKPHOS 69  BILITOT 1.0  PROT 8.5*  ALBUMIN 5.0   No results for input(s): LIPASE, AMYLASE in the last 168 hours. No results for input(s): AMMONIA in the last 168 hours. Coagulation Profile: No results for input(s): INR, PROTIME in the last 168 hours. Cardiac Enzymes: No results for input(s): CKTOTAL, CKMB, CKMBINDEX, TROPONINI in the last 168 hours. BNP (last 3 results) No results for input(s): PROBNP in the last 8760 hours. HbA1C: No results for input(s): HGBA1C in the last 72 hours. CBG: No results for input(s): GLUCAP in the last 168 hours. Lipid Profile: No results for input(s): CHOL, HDL, LDLCALC, TRIG, CHOLHDL, LDLDIRECT in the last 72 hours. Thyroid Function Tests: No results for input(s): TSH, T4TOTAL, FREET4, T3FREE, THYROIDAB in the last 72 hours. Anemia Panel: No results for input(s): VITAMINB12, FOLATE, FERRITIN, TIBC, IRON, RETICCTPCT in the last 72 hours. Sepsis Labs: No results for input(s): PROCALCITON, LATICACIDVEN in the last 168 hours.  Recent Results (from the past 240 hour(s))  Respiratory Panel by RT PCR (Flu A&B, Covid) - Nasopharyngeal Swab     Status: None   Collection Time: 03/30/20  9:07 PM   Specimen: Nasopharyngeal Swab  Result Value Ref Range Status   SARS Coronavirus 2 by RT PCR NEGATIVE NEGATIVE Final    Comment: (NOTE) SARS-CoV-2 target nucleic acids are NOT DETECTED. The SARS-CoV-2 RNA is generally detectable in upper respiratoy specimens during the acute phase of infection. The lowest concentration of SARS-CoV-2 viral copies this assay can detect is 131 copies/mL. A negative result does not preclude SARS-Cov-2 infection and should not be used as the sole basis for treatment or other patient management decisions. A negative result may occur with  improper specimen collection/handling, submission of specimen other than nasopharyngeal swab, presence of  viral mutation(s) within the areas targeted by this assay, and inadequate number of viral copies (<131 copies/mL). A negative result must be combined with clinical observations, patient history, and epidemiological information. The expected result is Negative. Fact Sheet for Patients:  PinkCheek.be Fact Sheet for Healthcare Providers:  GravelBags.it This test is not yet ap proved or cleared by the [place] FDA and  has been authorized for detection and/or diagnosis of SARS-CoV-2 by FDA under an Emergency Use Authorization (EUA). This EUA will remain  in effect (meaning this test can be used) for the duration of the COVID-19 declaration under Section 564(b)(1) of the Act, 21 U.S.C. section 360bbb-3(b)(1), unless the authorization is terminated or revoked sooner.    Influenza A by PCR NEGATIVE NEGATIVE Final   Influenza B by PCR NEGATIVE NEGATIVE Final    Comment: (NOTE) The Xpert Xpress SARS-CoV-2/FLU/RSV assay is intended as an aid in  the diagnosis of influenza from  Nasopharyngeal swab specimens and  should not be used as a sole basis for treatment. Nasal washings and  aspirates are unacceptable for Xpert Xpress SARS-CoV-2/FLU/RSV  testing. Fact Sheet for Patients: PinkCheek.be Fact Sheet for Healthcare Providers: GravelBags.it This test is not yet approved or cleared by the [place] FDA and  has been authorized for detection and/or diagnosis of SARS-CoV-2 by  FDA under an Emergency Use Authorization (EUA). This EUA will remain  in effect (meaning this test can be used) for the duration of the  Covid-19 declaration under Section 564(b)(1) of the Act, 21  U.S.C. section 360bbb-3(b)(1), unless the authorization is  terminated or revoked. Performed at [facility], [address], [place], [place] [address]          Radiology Studies: DG  Chest 1 View  Result Date: 03/30/2020 CLINICAL DATA:  Dyspnea, cough EXAM: CHEST  1 VIEW COMPARISON:  03/12/2020 FINDINGS: Single frontal view of the chest was obtained. The patient is rotated to the right, which limits the evaluation. Linear metallic densities are again seen within the right lung. Chronic scarring is seen at the right lung base. Severe background emphysema without airspace disease, effusion, or pneumothorax. No acute bony abnormalities. IMPRESSION: 1. Stable emphysema and scarring.  No acute airspace disease. Electronically Signed   By: Randa Ngo M.D.   On: 03/30/2020 21:17        Scheduled Meds: . enoxaparin (LOVENOX) injection  30 mg Subcutaneous Q24H  . finasteride  5 mg Oral Daily  . free water  300 mL Per Tube Q4H  . gabapentin  100 mg Oral BID  . lidocaine  1 patch Transdermal Daily  . liver oil-zinc oxide   Topical TID  . sodium polystyrene  15 g Per Tube Once  . thiamine  100 mg Per Tube Daily  . traZODone  50 mg Per Tube QHS   Continuous Infusions: . dextrose    . feeding supplement (JEVITY 1.2 CAL)       LOS: 0 days    Time spent: 25 minutes    Sharen Hones, MD [facility]   To contact the attending provider between 7A-7P or the covering provider during after hours 7P-7A, please log into the web site [email or web address] and access using universal [place] password for that web site. If you do not have the password, please call the hospital operator.  03/31/2020, 8:20 AM

## 2020-03-31 NOTE — Progress Notes (Signed)
I was asked to see this patient for a malfunctioning PEG tube.  I was told that the PEG tube was leaking around the PEG site.  On investigation of the placement of PEG it appears that a low-profile balloon PEG was placed by interventional radiology in the first week of March of this year.  At the bedside I noticed that the PEG tube length was much bigger they needed this patient thereby the balloon not occluding the gastric opening of the PEG site allowing fluid to go around the PEG site.  Slight traction was placed on the PEG tube and multiple 4 x 4's were used to put slight tension of the balloon on the intragastric wall thereby hopefully plugging up the leaking around the PEG tube.  If the patient has any further leaking then the patient should have the PEG tube replaced by interventional radiology with a PEG tube with a shorter stem.

## 2020-03-31 NOTE — Progress Notes (Signed)
RN requested clarification on diet orders from Sharion Settler, NP.  Patient is to be NPO. Requested Jevity from pharmacy and feeding pump from equipment to be delivered to floor.

## 2020-04-01 ENCOUNTER — Inpatient Hospital Stay: Payer: Medicaid Other

## 2020-04-01 DIAGNOSIS — Q390 Atresia of esophagus without fistula: Secondary | ICD-10-CM

## 2020-04-01 DIAGNOSIS — E860 Dehydration: Secondary | ICD-10-CM

## 2020-04-01 DIAGNOSIS — E870 Hyperosmolality and hypernatremia: Secondary | ICD-10-CM

## 2020-04-01 LAB — BASIC METABOLIC PANEL
Anion gap: 6 (ref 5–15)
BUN: 49 mg/dL — ABNORMAL HIGH (ref 6–20)
CO2: 34 mmol/L — ABNORMAL HIGH (ref 22–32)
Calcium: 9 mg/dL (ref 8.9–10.3)
Chloride: 105 mmol/L (ref 98–111)
Creatinine, Ser: 0.96 mg/dL (ref 0.61–1.24)
GFR calc Af Amer: >60 mL/min (ref >60)
GFR calc non Af Amer: >60 mL/min (ref >60)
Glucose, Bld: 116 mg/dL — ABNORMAL HIGH (ref 70–99)
Potassium: 3.3 mmol/L — ABNORMAL LOW (ref 3.5–5.1)
Sodium: 145 mmol/L (ref 135–145)

## 2020-04-01 LAB — MRSA PCR SCREENING: MRSA by PCR: POSITIVE — AB

## 2020-04-01 MED ORDER — FREE WATER
300.0000 mL | Freq: Every day | Status: DC
  Administered 2020-04-02: 300 mL

## 2020-04-01 MED ORDER — LIDOCAINE VISCOUS HCL 2 % MT SOLN
15.0000 mL | Freq: Once | OROMUCOSAL | Status: DC
  Filled 2020-04-01: qty 15

## 2020-04-01 MED ORDER — LIDOCAINE VISCOUS HCL 2 % MT SOLN
OROMUCOSAL | Status: AC
  Filled 2020-04-01: qty 15

## 2020-04-01 MED ORDER — CHLORHEXIDINE GLUCONATE CLOTH 2 % EX PADS
6.0000 | MEDICATED_PAD | Freq: Every day | CUTANEOUS | Status: DC
  Administered 2020-04-02: 6 via TOPICAL

## 2020-04-01 MED ORDER — IOHEXOL 300 MG/ML  SOLN
50.0000 mL | Freq: Once | INTRAMUSCULAR | Status: AC
  Administered 2020-04-01: 10 mL

## 2020-04-01 MED ORDER — POTASSIUM CHLORIDE 20 MEQ/15ML (10%) PO SOLN
20.0000 meq | Freq: Once | ORAL | Status: AC
  Administered 2020-04-01: 20 meq
  Filled 2020-04-01 (×2): qty 15

## 2020-04-01 MED ORDER — FREE WATER
120.0000 mL | Freq: Two times a day (BID) | Status: DC
  Administered 2020-04-01 – 2020-04-02 (×2): 120 mL

## 2020-04-01 MED ORDER — OSMOLITE 1.5 CAL PO LIQD
1000.0000 mL | ORAL | Status: DC
  Administered 2020-04-01: 1000 mL

## 2020-04-01 MED ORDER — MUPIROCIN 2 % EX OINT
1.0000 "application " | TOPICAL_OINTMENT | Freq: Two times a day (BID) | CUTANEOUS | Status: DC
  Administered 2020-04-01 – 2020-04-02 (×3): 1 via NASAL
  Filled 2020-04-01: qty 22

## 2020-04-01 NOTE — Procedures (Signed)
Interventional Radiology Procedure Note  Procedure: Exchange of gastrostomy tube  Complications: None  Estimated Blood Loss: None  Findings: 18 Fr MIC-Key short g-tube (placed at [facility] in March) poor fit because stem length too long for body habitus. Tube removed.  New 20 Fr standard balloon retention gastrostomy tube advanced through tract and into stomach. Retention balloon inflated with 8 mL saline. Contrast injection under fluoro confirms tube tip position in gastric lumen. OK to use new tube immediately.  Venetia Night. Kathlene Cote, M.D Pager:  [phone]

## 2020-04-01 NOTE — TOC Initial Note (Signed)
Transition of Care [facility]) - Initial/Assessment Note    Patient Details  Name: Thomas [name] MRN: [id number] Date of Birth: 1971/12/05  Transition of Care [facility]) CM/SW Contact:    Candie Chroman, LCSW Phone Number: 04/01/2020, 10:36 AM  Clinical Narrative: Readmission prevention screen complete. Patient currently on isolation precautions. CSW called patient in the room, introduced role, and explained that discharge planning would be discussed. PCP is [name], NP at [facility] in [place]. Mom drives him to appts. Pharmacy is [facility] in [place]. They mail his medications to his home in [place]. Patient is currently staying with his mom in [place]. Patient was getting home health services through [facility] while in [place] but according to the notes from his admission earlier this month, they did not have enough staff to see him at his mom's house in [place]. CSW asked [facility] representative to see if they would be able to see him now. Patient uses a cane at rollator at home. Patient asking why he is on isolation and said he has never been told he has MRSA before. RN and MD aware. No further concerns. CSW encouraged patient to contact CSW as needed. CSW will continue to follow patient for support and facilitate return home when stable.                Expected Discharge Plan: [place] Barriers to Discharge: Continued Medical Work up   Patient Goals and CMS Choice     Choice offered to / list presented to : NA  Expected Discharge Plan and Services Expected Discharge Plan: [place] Acute Care Choice: Resumption of Svcs/PTA Provider Living arrangements for the past 2 months: Single Family Home Expected Discharge Date: 04/01/20                                    Prior Living Arrangements/Services Living arrangements for the past 2 months: Single Family Home Lives with:: Parents Patient language and need  for interpreter reviewed:: Yes Do you feel safe going back to the place where you live?: Yes      Need for Family Participation in Patient Care: Yes (Comment) Care giver support system in place?: Yes (comment) Current home services: DME Criminal Activity/Legal Involvement Pertinent to Current Situation/Hospitalization: No - Comment as needed  Activities of Daily Living Home Assistive Devices/Equipment: Cane (specify quad or straight) ADL Screening (condition at time of admission) Patient's cognitive ability adequate to safely complete daily activities?: Yes Is the patient deaf or have difficulty hearing?: No Does the patient have difficulty seeing, even when wearing glasses/contacts?: No Does the patient have difficulty concentrating, remembering, or making decisions?: No Patient able to express need for assistance with ADLs?: Yes Does the patient have difficulty dressing or bathing?: No Independently performs ADLs?: Yes (appropriate for developmental age) Does the patient have difficulty walking or climbing stairs?: Yes Weakness of Legs: Both Weakness of Arms/Hands: None  Permission Sought/Granted Permission sought to share information with : Facility Art therapist granted to share information with : Yes, Verbal Permission Granted     Permission granted to share info w AGENCY: [facility]        Emotional Assessment Appearance:: Appears stated age Attitude/Demeanor/Rapport: Engaged Affect (typically observed): Accepting, Appropriate, Calm Orientation: : Oriented to Self, Oriented to Place, Oriented to  Time, Oriented to Situation Alcohol /  Substance Use: Not Applicable Psych Involvement: No (comment)  Admission diagnosis:  Dehydration with hypernatremia [E87.0] Patient Active Problem List   Diagnosis Date Noted  . Dehydration with hypernatremia 03/12/2020  . Chronic pain syndrome 03/12/2020  . Low grade fever 01/13/2020  . Esophageal atresia  01/13/2020  . Gastrostomy tube in place ([place]) 01/13/2020  . Protein-calorie malnutrition, severe 12/20/2019  . Right-sided chest pain 01/31/2019  . Anastomotic ulcer 01/31/2019   PCP:  [name], NP Pharmacy:   [facility], [place] [address] [place] [address] Phone: [phone] Fax: [phone]     Social Determinants of Health (SDOH) Interventions    Readmission Risk Interventions Readmission Risk Prevention Plan 04/01/2020  Transportation Screening Complete  PCP or Specialist Appt within 3-5 Days Complete  HRI or [place] Complete  Social Work Consult for [place] Planning/Counseling Complete  Palliative Care Screening Not Applicable  Medication Review Press photographer) Complete  Some recent data might be hidden
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
## 2020-04-01 NOTE — Progress Notes (Addendum)
Initial Nutrition Assessment  DOCUMENTATION CODES:   Severe malnutrition in context of chronic illness  INTERVENTION:   Nocturnal feeds: Osmolite 1.5 _0 /hr x 12 hours (2000-0800)  Flush with 157m of water before starting and after completion of tube feeds.   Flush with 3013mwater one time during the day.  Regimen provides 1800kcal/day, 75g/day protein and 145413may free water   NUTRITION DIAGNOSIS:   Severe Malnutrition related to chronic illness(chronic esophageal issues) as evidenced by 44 percent weight loss in 9 months, severe muscle depletion, severe fat depletion.  GOAL:   Patient will meet greater than or equal to 90% of their needs  MONITOR:   Labs, Weight trends, TF tolerance, Skin, I & O's  REASON FOR ASSESSMENT:   Consult Enteral/tube feeding initiation and management  ASSESSMENT:   [age]o male with h/o congenital esophageal atresia s/p colonic interposition 1976 [facility], s/p [facility] & [facility]ut[facility] (injuries included grade V liver laceration, duodenal rupture s/p resection of 8cm small bowel, subdural hemorrhage with traumatic brain injury and right pneumothorax), admitted 06/11/19 with perforation of esophageal graft with communication to the right pleural space and subsequent empyema s/p split fistula formation, TPN from 7/9-7/24, G-J tube placement, thoracotomy/decortication 06/13/19, s/p laparotomy, lower hemi-sternotomy, partial esophagectomy 06/28/19, s/p revision of spit fistula via right anterior thoracotomy on 07/05/2019, recurrent admissions for hypernatremia (1/11-15; 1/23-29; 2/6-11; and 2/20-25) who now presents with hypotension, hypernatremia and G-tube malfunction.   Addendum: Pt s/p IR placed 20 Fr standard balloon retention gastrostomy tube today  Per MD note, G-tube was placed by interventional radiology in the first week of March 2021.  Met with patient and pt's mother in room today. Pt prefers to  be called Thomas [name] admitted with leaking around his G-tube site and hypernatremia; pt seen by GI who felt like tube length was too long which was keeping the balloon from obstructing the stoma. GI recommends IR consult for possible tube exchange.  Pt reports that his home tube feed regimen is 4 cans of TwoCAL HN + 1 can of Boost high protein that he gives overnight via pump while he sleeps. Pt with multiple admits for hypernatremia. Pt also with a 59lb(44%) weight loss over the past 9 months (since having his tube placed). RD concerned about tube feed compliance as pt has continued to loose weight and have hypernatremia despite changing to a higher calorie formula and increasing free water flushes. Upon further questioning, pt admits that he does not always give his tube feeds. Pt reports difficult/malfunctioning pump; pt reports that when it malfunctions, he just skips his tube feeds. Mom at beside also reports that patient is "worn out" sometimes and just does not do his tube feeds. Pt also reports that his tube feed supplies have been being sent to his home address and that he lives with his mother now. Pt reports that he is non-compliant with his free water flushes during the day; he just does not give them most of the time. Mom reports that patient sleeps all day and night. RD [facility] and [facility] since February; when called, he reported to [employer] that he does not need supplies because of frequent hospital visits. [employer] did not have updated address for patient and reports that pt did not report pump malfunction.   RD had long discussion with patient and pt's mother today about the importance of adequate nutrition and that pt  is severely malnourished and at risk for death if he continues to be non-compliant with tube feeds. Brad from [employer] will plan to visit with patient to arrange for supplies to be sent to the correct address and to address  pump malfunction. RD will change tube feed regimen to a formula that provides more water. Pt requests to continue with nocturnal feeds. RD will try and schedule pt for minimal free water flushes as pt is likely to be more compliant the less involved his regimen is. RD unsure if hypernatremia is r/t increased fistula output vs pt's non-compliance with tube feed regimen. Will plan to switch patient over to his new tube feed regimen and monitor Na labs and hydration status. Some non-compliance with tube feeds may also be r/t leaking around the tube as pt reports that this is aggravating; would recommend IR consult to see if they can exchange tube.   Medications reviewed and include: lovenox, thiamine, 5% dextrose @75ml/hr  Labs reviewed: K 3.3(L), BUN 49(H) P 5.1(H), Mg 3.2(H)- 4/25  NUTRITION - FOCUSED PHYSICAL EXAM:    Most Recent Value  Orbital Region  Severe depletion  Upper Arm Region  Severe depletion  Thoracic and Lumbar Region  Severe depletion  Buccal Region  Severe depletion  Temple Region  Severe depletion  Clavicle Bone Region  Severe depletion  Clavicle and Acromion Bone Region  Severe depletion  Scapular Bone Region  Severe depletion  Dorsal Hand  Severe depletion  Patellar Region  Severe depletion  Anterior Thigh Region  Severe depletion  Posterior Calf Region  Severe depletion  Edema (RD Assessment)  None  Hair  Reviewed  Eyes  Reviewed  Mouth  Reviewed  Skin  Reviewed  Nails  Reviewed     Diet Order:   Diet Order            Diet NPO time specified Except for: Ice Chips  Diet effective now             EDUCATION NEEDS:   Education needs have been addressed  Skin:  Skin Assessment: Reviewed RN Assessment  Last BM:  pta  Height:   Ht Readings from Last 1 Encounters:  03/31/20 5' 4" (1.626 m)    Weight:   Wt Readings from Last 1 Encounters:  03/31/20 34.4 kg    Ideal Body Weight:  59 kg  BMI:  Body mass index is 13.02 kg/m.  Estimated  Nutritional Needs:   Kcal:  1600-1800kcal/day  Protein:  70-80g/day  Fluid:  1-1.2L/day  Casey Campbell MS, RD, LDN Please refer to AMION for RD and/or RD on-call/weekend/after hours pager   

## 2020-04-01 NOTE — Progress Notes (Signed)
Pharmacy Electrolyte Monitoring Consult:  Pharmacy consulted to assist in monitoring and replacing electrolytes in this 49 y.o. male admitted on 03/30/2020 with Shortness of Breath recurrent malfunction in his tube feeding pump.    Labs:  Sodium (mmol/L)  Date Value  04/01/2020 145   Potassium (mmol/L)  Date Value  04/01/2020 3.3 (L)   Magnesium (mg/dL)  Date Value  03/31/2020 3.2 (H)   Phosphorus (mg/dL)  Date Value  03/31/2020 5.1 (H)   Calcium (mg/dL)  Date Value  04/01/2020 9.0   Albumin (g/dL)  Date Value  03/30/2020 5.0    Assessment/Plan: K 3.3  Scr 0.96 Yesterday K was 5.2 and pt received Kayexelate x 1. Patient admitted w/ dehydration, PEG tube dysfxn.Had not received TF x 2 days prior to admit. PEG is leaking per RN note.  Renal fxn improved today. Will order KCL 20 meq per tube x 1. F/u with am labs  Sophira Rumler A 04/01/2020 8:21 AM

## 2020-04-01 NOTE — Progress Notes (Signed)
[facility] [place] NAME: Thomas [name]    MR#:  [id number]  DATE OF BIRTH:  1971/02/03  SUBJECTIVE:  patient came in with complaints of leakage around the peg site. He apparently has not been using his tube feeds as directed due to some issues with pump and supplies. Patient just relocated with his mother.  It has been progressively losing weight. Mother present in the room.  REVIEW OF SYSTEMS:   Review of Systems  Constitutional: Negative for chills, fever and weight loss.  HENT: Negative for ear discharge, ear pain and nosebleeds.   Eyes: Negative for blurred vision, pain and discharge.  Respiratory: Negative for sputum production, shortness of breath, wheezing and stridor.   Cardiovascular: Negative for chest pain, palpitations, orthopnea and PND.  Gastrointestinal: Negative for abdominal pain, diarrhea, nausea and vomiting.  Genitourinary: Negative for frequency and urgency.  Musculoskeletal: Positive for back pain and joint pain.  Neurological: Negative for sensory change, speech change, focal weakness and weakness.  Psychiatric/Behavioral: Negative for depression and hallucinations. The patient is not nervous/anxious.    Tolerating Diet:npo Tolerating PT:   DRUG ALLERGIES:   Allergies  Allergen Reactions  . Aspirin Other (See Comments)    Burns stomach   . Penicillins     Did it involve swelling of the face/tongue/throat, SOB, or low BP? Unknown Did it involve sudden or severe rash/hives, skin peeling, or any reaction on the inside of your mouth or nose? Unknown Did you need to seek medical attention at a hospital or doctor's office? Unknown When did it last happen?patient refuses to take due to parents being allergic If all above answers are "NO", may proceed with cephalosporin use. Parents allergic   . Vinegar [Acetic Acid] Other (See Comments)    seizure    VITALS:  Blood pressure 99/66, pulse (!) 50, temperature  98.1 F (36.7 C), temperature source Oral, resp. rate 14, height 5\' 4"  (1.626 m), weight 34.4 kg, SpO2 99 %.  PHYSICAL EXAMINATION:   Physical Exam  GENERAL:  49 y.o.-year-old patient lying in the bed with no acute distress. thin, cachectic, malnourished, appears chronically ill EYES: Pupils equal, round, reactive to light and accommodation. No scleral icterus.   HEENT: Head atraumatic, normocephalic. Oropharynx and nasopharynx clear.  NECK:  Supple, no jugular venous distention. No thyroid enlargement, no tenderness.  LUNGS: Normal breath sounds bilaterally, no wheezing, rales, rhonchi. No use of accessory muscles of respiration.  CARDIOVASCULAR: S1, S2 normal. No murmurs, rubs, or gallops.  ABDOMEN: Soft, nontender, nondistended. Bowel sounds present. No organomegaly or mass.  EXTREMITIES: No cyanosis, clubbing or edema b/l.    NEUROLOGIC: Cranial nerves II through XII are intact. No focal Motor or sensory deficits b/l.   PSYCHIATRIC:  patient is alert and oriented x 3.  SKIN: No obvious rash, lesion, or ulcer.   LABORATORY PANEL:  CBC Recent Labs  Lab 03/31/20 0529  WBC 9.7  HGB 14.8  HCT 50.4  PLT 190    Chemistries  Recent Labs  Lab 03/30/20 2107 03/30/20 2107 03/31/20 0529 03/31/20 0529 04/01/20 0623  NA 164*   < > 159*   < > 145  K 4.6   < > 5.2*   < > 3.3*  CL 115*   < > 114*   < > 105  CO2 32   < > 33*   < > 34*  GLUCOSE 119*   < > 142*   < > 116*  BUN 71*   < > 70*   < > 49*  CREATININE 1.42*   < > 1.41*   < > 0.96  CALCIUM 11.1*   < > 10.6*   < > 9.0  MG  --   --  3.2*  --   --   AST 23  --   --   --   --   ALT 31  --   --   --   --   ALKPHOS 69  --   --   --   --   BILITOT 1.0  --   --   --   --    < > = values in this interval not displayed.   Cardiac Enzymes No results for input(s): TROPONINI in the last 168 hours. RADIOLOGY:  DG Chest 1 View  Result Date: 03/30/2020 CLINICAL DATA:  Dyspnea, cough EXAM: CHEST  1 VIEW COMPARISON:  03/12/2020  FINDINGS: Single frontal view of the chest was obtained. The patient is rotated to the right, which limits the evaluation. Linear metallic densities are again seen within the right lung. Chronic scarring is seen at the right lung base. Severe background emphysema without airspace disease, effusion, or pneumothorax. No acute bony abnormalities. IMPRESSION: 1. Stable emphysema and scarring.  No acute airspace disease. Electronically Signed   By: Randa Ngo M.D.   On: 03/30/2020 21:17   DG Replc Gastro/Jejuno Tube Percut W/Fluoro  Result Date: 04/01/2020 INDICATION: History of esophageal atresia with chronic indwelling percutaneous gastrostomy tube. Significant leakage around current gastrostomy tube which was exchanged at [facility] on 02/09/2020. EXAM: EXCHANGE OF GASTROSTOMY TUBE UNDER FLUOROSCOPY MEDICATIONS: None ANESTHESIA/SEDATION: None CONTRAST:  10 mL Omnipaque 300-administered into the gastric lumen. FLUOROSCOPY TIME:  Fluoroscopy Time: 12 seconds.  2.3 mGy. COMPLICATIONS: None immediate. PROCEDURE: Informed written consent was obtained from the patient after a thorough discussion of the procedural risks, benefits and alternatives. All questions were addressed. Maximal Sterile Barrier Technique was utilized including caps, mask, sterile gowns, sterile gloves, sterile drape, hand hygiene and skin antiseptic. A timeout was performed prior to the initiation of the procedure. The pre-existing gastrostomy tube was inspected after removal of an overlying dressing. The retention balloon was deflated and the entire tube removed. A new 1 French standard balloon retention gastrostomy tube was advanced through the percutaneous tract and into the stomach. The retention balloon was inflated with 8 mL of saline. The tube was injected with contrast material under fluoroscopy to confirm positioning. FINDINGS: The current 55 French MIC-KEY low-profile gastrostomy tube appears intact. The stoma length of 2.7 cm is  clearly too long for the patient's body habitus with a significant portion of the stoma protruding out of the abdominal skin exit site. This is contributing to leakage problems around the tube as well as some stretching of the gastrostomy tube tract. A new 23 French standard balloon retention gastrostomy tube was easily advanced through the tract into the stomach. The tube tip lies in the body of the stomach. The retention disc was brought under tension at the skin exit site with use of a drain sponge between the retention disc and the skin. IMPRESSION: Removal of 18 French low-profile gastrostomy tube and replacement with a new 20 [place] standard balloon retention gastrostomy tube. This tube tip lies in the body of the stomach which was confirmed under fluoroscopy with contrast injection. This catheter is ready for immediate use. Electronically Signed   By: Aletta Edouard M.D.   On: 04/01/2020  15:30   ASSESSMENT AND PLAN:  49 year old male with esophageal atresia, he had multiple surgeries.  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  He had a recurrent malfunction in his tube feeding pump, but PEG tube was functional at this time.  He has not been receiving any tube feeding for the last 2 days.  He presents to the emergency room complaining of shortness of breath, he has extreme hyponatremia with sodium 164.  #1.  Severe hypernatremia/hypercalcemia -Secondary to dehydration, due to malfunction of tube feeding system. -  Patient has had multiple admission to the hospital with the same problem,  -will consult social worker to assess home condition.  -recieved IV D5 water--now d/ced  -Na 164--159--145 -calcium 11.4.--9.0  #2.  Severe calorie malnutrition. -Surprisingly, patient has a normal albumin.   -He is cachectic, will continue tube feeding.  -  nutrition consult appreciated. Apparently patient has not been compliant with his tube  feedings at home. Tube feeding is good to be done during nighttime for compliance reasons. Patient and mother understands.  3.  Acute kidney injury. Secondary to dehydration.   -improved after IV hydration  4.  Esophageal atresia with chronic G tube placement -appreciate IR dr Sharion Dove input -s/p 18 Fr MIC-Key short g-tube (placed at [facility] in March) poor fit because stem length too long for body habitus. Tube removed. -New 20 Fr standard balloon retention gastrostomy tube advanced through tract and into stomach. Retention balloon inflated with 8 mL saline. Done on 04/01/2020  5.  Mild hyperkalemia. Potassium now 3.3 after kayexalate   DVT prophylaxis: Lovenox Code Status: Full Family Communication: Plan discussed with the patient and mother in the room Disposition Plan:   Patient came from: Home                                                                                                                           Anticipated d/c place: home in 1-2 days.  Patient's tube feeding just got resumed after G-tube was replaced by IR. One of make sure patient tolerates feeding well without any G-tube leakage.   Status is: Inpatient patient currently is medically stable however needs to monitor her lights after tube feeding has been resumed and to ensure peg leakage is not present.   TOTAL TIME TAKING CARE OF THIS PATIENT: *30* minutes.  >50% time spent on counselling and coordination of care  Note: This dictation was prepared with Dragon dictation along with smaller phrase technology. Any transcriptional errors that result from this process are unintentional.  Fritzi Mandes M.D    [facility]   CC: Primary care physician; [name], NPPatient ID: [name], male   DOB: Oct 06, 1971, 49 y.o.   MRN: [id number]

## 2020-04-02 ENCOUNTER — Ambulatory Visit: Payer: Medicaid Other

## 2020-04-02 DIAGNOSIS — K9423 Gastrostomy malfunction: Principal | ICD-10-CM

## 2020-04-02 DIAGNOSIS — G894 Chronic pain syndrome: Secondary | ICD-10-CM

## 2020-04-02 LAB — PHOSPHORUS: Phosphorus: 3.2 mg/dL (ref 2.5–4.6)

## 2020-04-02 LAB — POTASSIUM: Potassium: 4 mmol/L (ref 3.5–5.1)

## 2020-04-02 LAB — MAGNESIUM: Magnesium: 2.5 mg/dL — ABNORMAL HIGH (ref 1.7–2.4)

## 2020-04-02 MED ORDER — OSMOLITE 1.5 CAL PO LIQD: 1000.0000 mL | ORAL | 0 refills | Status: DC
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
## 2020-04-02 NOTE — Progress Notes (Signed)
OT Cancellation Note  Patient Details Name: [name] MRN: [id number] DOB: 1971-07-09   Cancelled Treatment:    Reason Eval/Treat Not Completed: Other (comment). OT consult received and chart reviewed. Per RN, pt is currently on tube feed. Per [facility] pt has had prior issues with tube feeds and is losing weight. Will follow up as available.   Dessie Coma, M.S. OTR/L  04/02/20, 3:09 PM

## 2020-04-02 NOTE — Plan of Care (Signed)
The patient has been discharged. IV removed. The patient was stable upon discharged.  Problem: Education: Goal: Knowledge of General Education information will improve Description: Including pain rating scale, medication(s)/side effects and non-pharmacologic comfort measures Outcome: Completed/Met   Problem: Health Behavior/Discharge Planning: Goal: Ability to manage health-related needs will improve Outcome: Completed/Met   Problem: Clinical Measurements: Goal: Ability to maintain clinical measurements within normal limits will improve Outcome: Completed/Met Goal: Will remain free from infection Outcome: Completed/Met Goal: Diagnostic test results will improve Outcome: Completed/Met Goal: Respiratory complications will improve Outcome: Completed/Met Goal: Cardiovascular complication will be avoided Outcome: Completed/Met   Problem: Activity: Goal: Risk for activity intolerance will decrease Outcome: Completed/Met   Problem: Nutrition: Goal: Adequate nutrition will be maintained Outcome: Completed/Met   Problem: Coping: Goal: Level of anxiety will decrease Outcome: Completed/Met   Problem: Elimination: Goal: Will not experience complications related to bowel motility Outcome: Completed/Met Goal: Will not experience complications related to urinary retention Outcome: Completed/Met   Problem: Pain Managment: Goal: General experience of comfort will improve Outcome: Completed/Met   Problem: Safety: Goal: Ability to remain free from injury will improve Outcome: Completed/Met   Problem: Skin Integrity: Goal: Risk for impaired skin integrity will decrease Outcome: Completed/Met   Problem: Malnutrition  (NI-5.2) Goal: Food and/or nutrient delivery Description: Individualized approach for food/nutrient provision. Outcome: Completed/Met   Problem: Acute Rehab PT Goals(only PT should resolve) Goal: Pt will Roll Supine to Side Outcome: Completed/Met Goal: Patient Will  Transfer Sit To/From Stand Outcome: Completed/Met Goal: Pt Will Transfer Bed To Chair/Chair To Bed Outcome: Completed/Met Goal: Pt Will Ambulate Outcome: Completed/Met
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
## 2020-04-02 NOTE — TOC Progression Note (Signed)
Transition of Care [facility]) - Progression Note    Patient Details  Name: TAURICE PARLETTE MRN: [id number] Date of Birth: August 31, 1971  Transition of Care [facility]) CM/SW Contact  Shelbie Ammons, RN Phone Number: 04/02/2020, 3:50 PM  Clinical Narrative:   Leroy Sea with [employer] is making sure patient has needed supplies/pump in the home. Jason with [facility] can accept patient but only for PT. Patient is awaiting Psych consult.     Expected Discharge Plan: [place] Barriers to Discharge: Continued Medical Work up  Expected Discharge Plan and Services Expected Discharge Plan: [place] Choice: Resumption of Svcs/PTA Provider Living arrangements for the past 2 months: Single Family Home Expected Discharge Date: 04/02/20                                     Social Determinants of Health (SDOH) Interventions    Readmission Risk Interventions Readmission Risk Prevention Plan 04/01/2020  Transportation Screening Complete  PCP or Specialist Appt within 3-5 Days Complete  HRI or [place] Complete  Social Work Consult for [place] Planning/Counseling Complete  Palliative Care Screening Not Applicable  Medication Review Press photographer) Complete  Some recent data might be hidden

## 2020-04-02 NOTE — Discharge Instructions (Signed)
Patient and his mother I advised to use tube feeding as directed by dietitian and follow-up with primary care physician. They are supposed to get in touch with their pump company if it gives any issues.

## 2020-04-02 NOTE — Progress Notes (Signed)
Pharmacy Electrolyte Monitoring Consult:  Pharmacy consulted to assist in monitoring and replacing electrolytes in this 49 y.o. male admitted on 03/30/2020 with Shortness of Breath recurrent malfunction in his tube feeding pump.    Labs:  Sodium (mmol/L)  Date Value  04/01/2020 145   Potassium (mmol/L)  Date Value  04/02/2020 4.0   Magnesium (mg/dL)  Date Value  04/02/2020 2.5 (H)   Phosphorus (mg/dL)  Date Value  04/02/2020 3.2   Calcium (mg/dL)  Date Value  04/01/2020 9.0   Albumin (g/dL)  Date Value  03/30/2020 5.0    Assessment/Plan: K 4.0 Mag 2.5(elevated), Phos 3.2  Scr 0.96(from 4/26) No further supplementation need today F/u with am labs  [facility] A [place] Stage 04/02/2020 9:38 AM

## 2020-04-04 ENCOUNTER — Other Ambulatory Visit: Payer: Self-pay

## 2020-04-04 ENCOUNTER — Inpatient Hospital Stay
Admission: EM | Admit: 2020-04-04 | Discharge: 2020-04-09 | DRG: 689 | Disposition: A | Discharge status: Home Health | Payer: Medicaid Other | Attending: Internal Medicine | Admitting: Internal Medicine

## 2020-04-04 DIAGNOSIS — K9423 Gastrostomy malfunction: Secondary | ICD-10-CM

## 2020-04-04 DIAGNOSIS — Z681 Body mass index (BMI) 19 or less, adult: Secondary | ICD-10-CM

## 2020-04-04 DIAGNOSIS — B961 Klebsiella pneumoniae [K. pneumoniae] as the cause of diseases classified elsewhere: Secondary | ICD-10-CM | POA: Diagnosis present

## 2020-04-04 DIAGNOSIS — R339 Retention of urine, unspecified: Secondary | ICD-10-CM | POA: Diagnosis present

## 2020-04-04 DIAGNOSIS — Z87891 Personal history of nicotine dependence: Secondary | ICD-10-CM

## 2020-04-04 DIAGNOSIS — E860 Dehydration: Secondary | ICD-10-CM | POA: Diagnosis present

## 2020-04-04 DIAGNOSIS — N390 Urinary tract infection, site not specified: Principal | ICD-10-CM | POA: Diagnosis present

## 2020-04-04 DIAGNOSIS — Z9049 Acquired absence of other specified parts of digestive tract: Secondary | ICD-10-CM

## 2020-04-04 DIAGNOSIS — E870 Hyperosmolality and hypernatremia: Secondary | ICD-10-CM | POA: Diagnosis present

## 2020-04-04 DIAGNOSIS — Z87738 Personal history of other specified (corrected) congenital malformations of digestive system: Secondary | ICD-10-CM

## 2020-04-04 DIAGNOSIS — E162 Hypoglycemia, unspecified: Secondary | ICD-10-CM | POA: Diagnosis present

## 2020-04-04 DIAGNOSIS — Z931 Gastrostomy status: Secondary | ICD-10-CM

## 2020-04-04 DIAGNOSIS — E876 Hypokalemia: Secondary | ICD-10-CM | POA: Diagnosis not present

## 2020-04-04 DIAGNOSIS — N3289 Other specified disorders of bladder: Secondary | ICD-10-CM | POA: Diagnosis present

## 2020-04-04 DIAGNOSIS — Z880 Allergy status to penicillin: Secondary | ICD-10-CM

## 2020-04-04 DIAGNOSIS — E43 Unspecified severe protein-calorie malnutrition: Secondary | ICD-10-CM | POA: Diagnosis present

## 2020-04-04 DIAGNOSIS — Z20822 Contact with and (suspected) exposure to covid-19: Secondary | ICD-10-CM | POA: Diagnosis present

## 2020-04-04 DIAGNOSIS — I959 Hypotension, unspecified: Secondary | ICD-10-CM | POA: Diagnosis not present

## 2020-04-04 DIAGNOSIS — Z8249 Family history of ischemic heart disease and other diseases of the circulatory system: Secondary | ICD-10-CM

## 2020-04-04 DIAGNOSIS — R338 Other retention of urine: Secondary | ICD-10-CM | POA: Diagnosis present

## 2020-04-04 DIAGNOSIS — R5381 Other malaise: Secondary | ICD-10-CM | POA: Diagnosis present

## 2020-04-04 LAB — URINALYSIS, COMPLETE (UACMP) WITH MICROSCOPIC
Bilirubin Urine: NEGATIVE
Glucose, UA: NEGATIVE mg/dL
Ketones, ur: NEGATIVE mg/dL
Nitrite: NEGATIVE
Protein, ur: 30 mg/dL — AB
Specific Gravity, Urine: 1.02 (ref 1.005–1.030)
Squamous Epithelial / HPF: NONE SEEN (ref 0–5)
pH: 8.5 — ABNORMAL HIGH (ref 5.0–8.0)

## 2020-04-04 LAB — CBC
HCT: 43.4 % (ref 39.0–52.0)
Hemoglobin: 13.1 g/dL (ref 13.0–17.0)
MCH: 28.8 pg (ref 26.0–34.0)
MCHC: 30.2 g/dL (ref 30.0–36.0)
MCV: 95.4 fL (ref 80.0–100.0)
Platelets: 121 10*3/uL — ABNORMAL LOW (ref 150–400)
RBC: 4.55 MIL/uL (ref 4.22–5.81)
RDW: 16.9 % — ABNORMAL HIGH (ref 11.5–15.5)
WBC: 7.7 10*3/uL (ref 4.0–10.5)
nRBC: 0 % (ref 0.0–0.2)

## 2020-04-04 LAB — BASIC METABOLIC PANEL
Anion gap: 7 (ref 5–15)
BUN: 38 mg/dL — ABNORMAL HIGH (ref 6–20)
CO2: 35 mmol/L — ABNORMAL HIGH (ref 22–32)
Calcium: 9.9 mg/dL (ref 8.9–10.3)
Chloride: 110 mmol/L (ref 98–111)
Creatinine, Ser: 0.62 mg/dL (ref 0.61–1.24)
GFR calc Af Amer: >60 mL/min (ref >60)
GFR calc non Af Amer: >60 mL/min (ref >60)
Glucose, Bld: 106 mg/dL — ABNORMAL HIGH (ref 70–99)
Potassium: 4.1 mmol/L (ref 3.5–5.1)
Sodium: 152 mmol/L — ABNORMAL HIGH (ref 135–145)

## 2020-04-04 LAB — RESPIRATORY PANEL BY RT PCR (FLU A&B, COVID)
Influenza A by PCR: NEGATIVE
Influenza B by PCR: NEGATIVE
SARS Coronavirus 2 by RT PCR: NEGATIVE

## 2020-04-04 MED ORDER — FINASTERIDE 5 MG PO TABS
5.0000 mg | ORAL_TABLET | Freq: Every day | ORAL | Status: DC
  Administered 2020-04-04 – 2020-04-09 (×5): 5 mg via ORAL
  Filled 2020-04-04 (×5): qty 1

## 2020-04-04 MED ORDER — ACETAMINOPHEN 325 MG PO TABS: 650.0000 mg | ORAL_TABLET | Freq: Four times a day (QID) | ORAL | Status: DC | PRN

## 2020-04-04 MED ORDER — ONDANSETRON HCL 4 MG/2ML IJ SOLN: 4.0000 mg | Freq: Four times a day (QID) | INTRAMUSCULAR | Status: DC | PRN

## 2020-04-04 MED ORDER — THIAMINE HCL 100 MG PO TABS
100.0000 mg | ORAL_TABLET | Freq: Every day | ORAL | Status: DC
  Administered 2020-04-04 – 2020-04-09 (×5): 100 mg
  Filled 2020-04-04 (×5): qty 1

## 2020-04-04 MED ORDER — ZINC OXIDE 40 % EX OINT
1.0000 "application " | TOPICAL_OINTMENT | Freq: Three times a day (TID) | CUTANEOUS | Status: DC
  Administered 2020-04-07 – 2020-04-08 (×5): 1 via TOPICAL
  Filled 2020-04-04: qty 113

## 2020-04-04 MED ORDER — DEXTROSE 5 % IV SOLN: INTRAVENOUS | Status: AC

## 2020-04-04 MED ORDER — GABAPENTIN 100 MG PO CAPS
100.0000 mg | ORAL_CAPSULE | Freq: Two times a day (BID) | ORAL | Status: DC
  Administered 2020-04-04 – 2020-04-09 (×9): 100 mg via ORAL
  Filled 2020-04-04 (×9): qty 1

## 2020-04-04 MED ORDER — ACETAMINOPHEN 650 MG RE SUPP: 650.0000 mg | Freq: Four times a day (QID) | RECTAL | Status: DC | PRN

## 2020-04-04 MED ORDER — LACTATED RINGERS IV SOLN: INTRAVENOUS | Status: DC

## 2020-04-04 MED ORDER — LACTATED RINGERS IV BOLUS
1000.0000 mL | Freq: Once | INTRAVENOUS | Status: AC
  Administered 2020-04-04: 18:00:00 1000 mL via INTRAVENOUS

## 2020-04-04 MED ORDER — POLYETHYLENE GLYCOL 3350 17 G PO PACK: 17.0000 g | PACK | Freq: Every day | ORAL | Status: DC | PRN

## 2020-04-04 MED ORDER — HYDROCODONE-ACETAMINOPHEN 7.5-325 MG/15ML PO SOLN
10.0000 mL | Freq: Four times a day (QID) | ORAL | Status: DC | PRN
  Administered 2020-04-04 – 2020-04-09 (×7): 10 mL
  Filled 2020-04-04 (×7): qty 15

## 2020-04-04 MED ORDER — MORPHINE SULFATE (PF) 4 MG/ML IV SOLN
4.0000 mg | Freq: Once | INTRAVENOUS | Status: AC
  Administered 2020-04-04: 18:00:00 4 mg via INTRAVENOUS
  Filled 2020-04-04: qty 1

## 2020-04-04 MED ORDER — ONDANSETRON HCL 4 MG PO TABS: 4.0000 mg | ORAL_TABLET | Freq: Four times a day (QID) | ORAL | Status: DC | PRN

## 2020-04-04 MED ORDER — SODIUM CHLORIDE 0.9 % IV SOLN
1.0000 g | INTRAVENOUS | Status: DC
  Administered 2020-04-05 – 2020-04-07 (×3): 1 g via INTRAVENOUS
  Filled 2020-04-04 (×2): qty 1
  Filled 2020-04-04: qty 10
  Filled 2020-04-04: qty 1

## 2020-04-04 MED ORDER — POLYVINYL ALCOHOL 1.4 % OP SOLN
1.0000 [drp] | Freq: Two times a day (BID) | OPHTHALMIC | Status: DC | PRN
  Administered 2020-04-06 – 2020-04-09 (×4): 1 [drp] via OPHTHALMIC
  Filled 2020-04-04 (×2): qty 15

## 2020-04-04 MED ORDER — ACETAMINOPHEN 500 MG PO TABS: 1000.0000 mg | ORAL_TABLET | Freq: Three times a day (TID) | ORAL | Status: DC | PRN

## 2020-04-04 MED ORDER — TERAZOSIN HCL 2 MG PO CAPS
2.0000 mg | ORAL_CAPSULE | Freq: Every day | ORAL | Status: DC
  Administered 2020-04-04 – 2020-04-07 (×4): 2 mg
  Filled 2020-04-04 (×6): qty 1

## 2020-04-04 MED ORDER — SODIUM CHLORIDE 0.9 % IV SOLN
1.0000 g | Freq: Once | INTRAVENOUS | Status: AC
  Administered 2020-04-04: 17:00:00 1 g via INTRAVENOUS
  Filled 2020-04-04: qty 10

## 2020-04-04 MED ORDER — TRAZODONE HCL 50 MG PO TABS
50.0000 mg | ORAL_TABLET | Freq: Every day | ORAL | Status: DC
  Administered 2020-04-04 – 2020-04-08 (×5): 50 mg
  Filled 2020-04-04 (×5): qty 1
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
## 2020-04-04 NOTE — ED Notes (Signed)
Ready bed @ 1845, patient going to room 229. Spoke with RN Janett Billow.

## 2020-04-04 NOTE — ED Triage Notes (Signed)
PT to ED via POV from home c/o urinary retention. PT has not urinated all morning. Bladder scan reveals 636mL. PT attempted to urinate multiple times, including in triage room. PT was unable. PT also c/o crack in indwelling tube in chest and extra drainage from G tube, which was just replaced this week. Drainage does not appear purulent.

## 2020-04-04 NOTE — ED Notes (Signed)
Transport to floor room 229.AS
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
## 2020-04-04 NOTE — ED Notes (Addendum)
Pt c/o lower back pain, legs and knees.  St urinary retention that started today along with burning when voiding. Denies CP/SHOB/Dizziness.

## 2020-04-05 ENCOUNTER — Encounter: Payer: Self-pay | Admitting: Internal Medicine

## 2020-04-05 DIAGNOSIS — Z9049 Acquired absence of other specified parts of digestive tract: Secondary | ICD-10-CM | POA: Diagnosis not present

## 2020-04-05 DIAGNOSIS — E162 Hypoglycemia, unspecified: Secondary | ICD-10-CM | POA: Diagnosis present

## 2020-04-05 DIAGNOSIS — K9423 Gastrostomy malfunction: Secondary | ICD-10-CM | POA: Diagnosis present

## 2020-04-05 DIAGNOSIS — Z20822 Contact with and (suspected) exposure to covid-19: Secondary | ICD-10-CM | POA: Diagnosis present

## 2020-04-05 DIAGNOSIS — Z8249 Family history of ischemic heart disease and other diseases of the circulatory system: Secondary | ICD-10-CM | POA: Diagnosis not present

## 2020-04-05 DIAGNOSIS — I959 Hypotension, unspecified: Secondary | ICD-10-CM | POA: Diagnosis not present

## 2020-04-05 DIAGNOSIS — N3289 Other specified disorders of bladder: Secondary | ICD-10-CM | POA: Diagnosis present

## 2020-04-05 DIAGNOSIS — R338 Other retention of urine: Secondary | ICD-10-CM | POA: Diagnosis present

## 2020-04-05 DIAGNOSIS — Z681 Body mass index (BMI) 19 or less, adult: Secondary | ICD-10-CM | POA: Diagnosis not present

## 2020-04-05 DIAGNOSIS — R5381 Other malaise: Secondary | ICD-10-CM | POA: Diagnosis present

## 2020-04-05 DIAGNOSIS — E860 Dehydration: Secondary | ICD-10-CM | POA: Diagnosis present

## 2020-04-05 DIAGNOSIS — N390 Urinary tract infection, site not specified: Secondary | ICD-10-CM | POA: Diagnosis present

## 2020-04-05 DIAGNOSIS — E876 Hypokalemia: Secondary | ICD-10-CM | POA: Diagnosis not present

## 2020-04-05 DIAGNOSIS — E870 Hyperosmolality and hypernatremia: Secondary | ICD-10-CM

## 2020-04-05 DIAGNOSIS — R339 Retention of urine, unspecified: Secondary | ICD-10-CM | POA: Diagnosis present

## 2020-04-05 DIAGNOSIS — Z880 Allergy status to penicillin: Secondary | ICD-10-CM | POA: Diagnosis not present

## 2020-04-05 DIAGNOSIS — B961 Klebsiella pneumoniae [K. pneumoniae] as the cause of diseases classified elsewhere: Secondary | ICD-10-CM | POA: Diagnosis present

## 2020-04-05 DIAGNOSIS — E43 Unspecified severe protein-calorie malnutrition: Secondary | ICD-10-CM | POA: Diagnosis present

## 2020-04-05 DIAGNOSIS — Z87738 Personal history of other specified (corrected) congenital malformations of digestive system: Secondary | ICD-10-CM | POA: Diagnosis not present

## 2020-04-05 DIAGNOSIS — Z87891 Personal history of nicotine dependence: Secondary | ICD-10-CM | POA: Diagnosis not present

## 2020-04-05 LAB — BASIC METABOLIC PANEL
Anion gap: 9 (ref 5–15)
BUN: 25 mg/dL — ABNORMAL HIGH (ref 6–20)
CO2: 32 mmol/L (ref 22–32)
Calcium: 9.6 mg/dL (ref 8.9–10.3)
Chloride: 108 mmol/L (ref 98–111)
Creatinine, Ser: 0.61 mg/dL (ref 0.61–1.24)
GFR calc Af Amer: >60 mL/min (ref >60)
GFR calc non Af Amer: >60 mL/min (ref >60)
Glucose, Bld: 101 mg/dL — ABNORMAL HIGH (ref 70–99)
Potassium: 3.6 mmol/L (ref 3.5–5.1)
Sodium: 149 mmol/L — ABNORMAL HIGH (ref 135–145)

## 2020-04-05 LAB — GLUCOSE, CAPILLARY
Glucose-Capillary: 67 mg/dL — ABNORMAL LOW (ref 70–99)
Glucose-Capillary: 149 mg/dL — ABNORMAL HIGH (ref 70–99)

## 2020-04-05 LAB — CBC
HCT: 41.9 % (ref 39.0–52.0)
Hemoglobin: 12.5 g/dL — ABNORMAL LOW (ref 13.0–17.0)
MCH: 28.6 pg (ref 26.0–34.0)
MCHC: 29.8 g/dL — ABNORMAL LOW (ref 30.0–36.0)
MCV: 95.9 fL (ref 80.0–100.0)
Platelets: 107 10*3/uL — ABNORMAL LOW (ref 150–400)
RBC: 4.37 MIL/uL (ref 4.22–5.81)
RDW: 17.2 % — ABNORMAL HIGH (ref 11.5–15.5)
WBC: 6.1 10*3/uL (ref 4.0–10.5)
nRBC: 0 % (ref 0.0–0.2)

## 2020-04-05 MED ORDER — FREE WATER: 300.0000 mL | Freq: Every day | Status: DC

## 2020-04-05 MED ORDER — DEXTROSE 50 % IV SOLN
INTRAVENOUS | Status: AC
  Administered 2020-04-05: 21:00:00 12.5 mL
  Filled 2020-04-05: qty 50

## 2020-04-05 MED ORDER — DEXTROSE 5 % IV SOLN: INTRAVENOUS | Status: AC

## 2020-04-05 MED ORDER — CHLORHEXIDINE GLUCONATE CLOTH 2 % EX PADS
6.0000 | MEDICATED_PAD | Freq: Every day | CUTANEOUS | Status: DC
  Administered 2020-04-05 – 2020-04-09 (×4): 6 via TOPICAL

## 2020-04-05 MED ORDER — OSMOLITE 1.5 CAL PO LIQD
1000.0000 mL | ORAL | Status: DC
  Administered 2020-04-05 – 2020-04-06 (×2): 100 mL

## 2020-04-05 MED ORDER — OSMOLITE 1.2 CAL PO LIQD: 1000.0000 mL | ORAL | Status: DC

## 2020-04-05 MED ORDER — HEPARIN SODIUM (PORCINE) 5000 UNIT/ML IJ SOLN
5000.0000 [IU] | Freq: Three times a day (TID) | INTRAMUSCULAR | Status: DC
  Filled 2020-04-05: qty 1

## 2020-04-05 MED ORDER — MORPHINE SULFATE (PF) 4 MG/ML IV SOLN
4.0000 mg | Freq: Four times a day (QID) | INTRAVENOUS | Status: DC | PRN
  Administered 2020-04-05 – 2020-04-09 (×8): 4 mg via INTRAVENOUS
  Filled 2020-04-05 (×8): qty 1

## 2020-04-05 NOTE — Progress Notes (Signed)
Progress Note    Thomas [name]  [id number] DOB: 02-Feb-1971  DOA: 04/04/2020 PCP: [name], NP      Brief Narrative:    Medical records reviewed and are as summarized below:  Thomas [name] is an 49 y.o. male  with medical history significant for h/o congenital esophageal atresia s/p colonic interposition 1976 [facility], s/p gastrocolic &enterocutaneous fistula repair secondary to [facility] 01/08/10 [facility] (injuries included grade V liver laceration, duodenal rupture s/p resection of 8cm small bowel, subdural hemorrhage with traumatic brain injury and right pneumothorax), admitted 06/11/19 with perforation of esophageal graft with communication to the right pleural space and subsequent empyema s/p split fistula formation, TPN from 7/9-7/24, G-J tube placement, thoracotomy/decortication 06/13/19, s/p laparotomy, lower hemi-sternotomy, partial esophagectomy 06/28/19, s/p revision of spit fistula via right anterior thoracotomy on 07/05/2019 who presented to the ED for evaluation of dysuria and urinary retention. Patient was recently hospitalized from 03/30/2020-04/02/2020 for management of hypernatremia, hypercalcemia, and G-tube dysfunction.  Gastrostomy tube was replaced by interventional radiology on 04/01/2020.  Serum sodium improved from 164-145 and calcium from 11.4-9.0.  He presented to the hospital with dysuria and urinary retention.  He also complained that the hub of the gastrotomy tube, that was recently exchanged, is cracked.   Assessment/Plan:   Principal Problem:   Urinary retention Active Problems:   Dehydration with hypernatremia   UTI (urinary tract infection)   Gastrostomy tube dysfunction (HCC)   Acute UTI  Urinary retention with UTI: Patient reports new dysuria with decreased urine output/urinary retention.  Bladder scan in the ED showed 696 mL in the bladder.  Foley catheter was placed with improved urine output.  Patient had blood clots in his catheter today. -Continue Foley  care -Monitor urine output -Continue IV ceftriaxone.  Analgesics as needed for pain. -Follow urine culture  Esophageal atresia s/p split fistula and chronic G-tube placement and current G-tube dysfunction: Gastrostomy tube was recently replaced by IR on 04/01/2020. IR was consulted for GT tube dysfunction.  It was suggested that gastrotomy tube can be used for enteral nutrition in the interim.  Request has been made for a new gastrostomy tube to be placed but this will not be done until 04/08/2020 because it is on national backorder. Esophageal spit fistula appears to be working well.  Patient also requested to continue with liquid diet.   Hypernatremia: Improving.  Continue IV fluids.  Monitor BMP.  Body mass index is 12.75 kg/m.  (Underweight)/severe protein calorie malnutrition Resume enteral nutrition.  Consulted dietitian to assist with management.   Family Communication/Anticipated D/C date and plan/Code Status   DVT prophylaxis: Heparin Code Status: Full code Family Communication: Plan discussed with his mother Disposition Plan:   Status is: Inpatient  Remains inpatient appropriate because:Unsafe d/c plan, IV treatments appropriate due to intensity of illness or inability to take PO and Inpatient level of care appropriate due to severity of illness   Dispo: The patient is from: Home              Anticipated d/c is to: Home              Anticipated d/c date is: 3 days              Patient currently is not medically stable to d/c.               Subjective:   He complains of dysuria and low back pain.  Tia, RN, said patient had blood clots  in her urine/Foley catheter this morning but this has cleared.  Objective:    Vitals:   04/04/20 1916 04/04/20 2030 04/05/20 0343 04/05/20 1401  BP: 110/75 90/72 101/72 95/63  Pulse: 78 83 67 63  Resp: 18 18 18 14   Temp:  (!) 97.5 F (36.4 C) 97.7 F (36.5 C) (!) 97.4 F (36.3 C)  TempSrc:  Oral    SpO2: 99% 94%  100% 100%  Weight:      Height:       No data found.   Intake/Output Summary (Last 24 hours) at 04/05/2020 1445 Last data filed at 04/05/2020 1200 Gross per 24 hour  Intake 613.79 ml  Output 7420 ml  Net -6806.21 ml   Filed Weights   04/04/20 1523  Weight: 35.8 kg    Exam:  GEN: NAD, cachectic SKIN: No rash EYES: EOMI ENT: MMM CV: RRR PULM: CTA B ABD: soft, ND, NT, +BS, + gastrostomy tube, esophagostomy bag on right upper chest CNS: AAO x 3, non focal EXT: No edema or tenderness GU: Foley catheter with dark urine in the urine bag.   Data Reviewed:   I have personally reviewed following labs and imaging studies:  Labs: Labs show the following:   Basic Metabolic Panel: Recent Labs  Lab 03/30/20 2107 03/30/20 2107 03/31/20 0529 03/31/20 0529 04/01/20 [id number] 04/01/20 [id number] 04/02/20 0813 04/02/20 0813 04/04/20 1547 04/05/20 0946  NA 164*  --  159*  --  145  --   --   --  152* 149*  K 4.6   < > 5.2*   < > 3.3*   < > 4.0   < > 4.1 3.6  CL 115*  --  114*  --  105  --   --   --  110 108  CO2 32  --  33*  --  34*  --   --   --  35* 32  GLUCOSE 119*  --  142*  --  116*  --   --   --  106* 101*  BUN 71*  --  70*  --  49*  --   --   --  38* 25*  CREATININE 1.42*  --  1.41*  --  0.96  --   --   --  0.62 0.61  CALCIUM 11.1*  --  10.6*  --  9.0  --   --   --  9.9 9.6  MG  --   --  3.2*  --   --   --  2.5*  --   --   --   PHOS  --   --  5.1*  --   --   --  3.2  --   --   --    < > = values in this interval not displayed.   GFR Estimated Creatinine Clearance: 56.6 mL/min (by C-G formula based on SCr of 0.61 mg/dL). Liver Function Tests: Recent Labs  Lab 03/30/20 2107  AST 23  ALT 31  ALKPHOS 69  BILITOT 1.0  PROT 8.5*  ALBUMIN 5.0   No results for input(s): LIPASE, AMYLASE in the last 168 hours. No results for input(s): AMMONIA in the last 168 hours. Coagulation profile No results for input(s): INR, PROTIME in the last 168 hours.  CBC: Recent Labs  Lab  03/30/20 2107 03/31/20 0529 04/04/20 1547 04/05/20 0946  WBC 9.7 9.7 7.7 6.1  NEUTROABS 6.6  --   --   --   HGB  16.3 14.8 13.1 12.5*  HCT 54.5* 50.4 43.4 41.9  MCV 94.9 97.7 95.4 95.9  PLT 253 190 121* 107*   Cardiac Enzymes: No results for input(s): CKTOTAL, CKMB, CKMBINDEX, TROPONINI in the last 168 hours. BNP (last 3 results) No results for input(s): PROBNP in the last 8760 hours. CBG: No results for input(s): GLUCAP in the last 168 hours. D-Dimer: No results for input(s): DDIMER in the last 72 hours. Hgb A1c: No results for input(s): HGBA1C in the last 72 hours. Lipid Profile: No results for input(s): CHOL, HDL, LDLCALC, TRIG, CHOLHDL, LDLDIRECT in the last 72 hours. Thyroid function studies: No results for input(s): TSH, T4TOTAL, T3FREE, THYROIDAB in the last 72 hours.  Invalid input(s): FREET3 Anemia work up: No results for input(s): VITAMINB12, FOLATE, FERRITIN, TIBC, IRON, RETICCTPCT in the last 72 hours. Sepsis Labs: Recent Labs  Lab 03/30/20 2107 03/31/20 0529 04/04/20 1547 04/05/20 0946  WBC 9.7 9.7 7.7 6.1    Microbiology Recent Results (from the past 240 hour(s))  Respiratory Panel by RT PCR (Flu A&B, Covid) - Nasopharyngeal Swab     Status: None   Collection Time: 03/30/20  9:07 PM   Specimen: Nasopharyngeal Swab  Result Value Ref Range Status   SARS Coronavirus 2 by RT PCR NEGATIVE NEGATIVE Final    Comment: (NOTE) SARS-CoV-2 target nucleic acids are NOT DETECTED. The SARS-CoV-2 RNA is generally detectable in upper respiratoy specimens during the acute phase of infection. The lowest concentration of SARS-CoV-2 viral copies this assay can detect is 131 copies/mL. A negative result does not preclude SARS-Cov-2 infection and should not be used as the sole basis for treatment or other patient management decisions. A negative result may occur with  improper specimen collection/handling, submission of specimen other than nasopharyngeal swab, presence  of viral mutation(s) within the areas targeted by this assay, and inadequate number of viral copies (<131 copies/mL). A negative result must be combined with clinical observations, patient history, and epidemiological information. The expected result is Negative. Fact Sheet for Patients:  PinkCheek.be Fact Sheet for Healthcare Providers:  GravelBags.it This test is not yet ap proved or cleared by the [place] FDA and  has been authorized for detection and/or diagnosis of SARS-CoV-2 by FDA under an Emergency Use Authorization (EUA). This EUA will remain  in effect (meaning this test can be used) for the duration of the COVID-19 declaration under Section 564(b)(1) of the Act, 21 U.S.C. section 360bbb-3(b)(1), unless the authorization is terminated or revoked sooner.    Influenza A by PCR NEGATIVE NEGATIVE Final   Influenza B by PCR NEGATIVE NEGATIVE Final    Comment: (NOTE) The Xpert Xpress SARS-CoV-2/FLU/RSV assay is intended as an aid in  the diagnosis of influenza from Nasopharyngeal swab specimens and  should not be used as a sole basis for treatment. Nasal washings and  aspirates are unacceptable for Xpert Xpress SARS-CoV-2/FLU/RSV  testing. Fact Sheet for Patients: PinkCheek.be Fact Sheet for Healthcare Providers: GravelBags.it This test is not yet approved or cleared by the [place] FDA and  has been authorized for detection and/or diagnosis of SARS-CoV-2 by  FDA under an Emergency Use Authorization (EUA). This EUA will remain  in effect (meaning this test can be used) for the duration of the  Covid-19 declaration under Section 564(b)(1) of the Act, 21  U.S.C. section 360bbb-3(b)(1), unless the authorization is  terminated or revoked. Performed at [facility], [address], [place], [place] [address]   MRSA PCR Screening  Status:  Abnormal   Collection Time: 04/01/20 11:27 AM   Specimen: Nasopharyngeal  Result Value Ref Range Status   MRSA by PCR POSITIVE (A) NEGATIVE Final    Comment:        The GeneXpert MRSA Assay (FDA approved for NASAL specimens only), is one component of a comprehensive MRSA colonization surveillance program. It is not intended to diagnose MRSA infection nor to guide or monitor treatment for MRSA infections. RESULT CALLED TO, READ BACK BY AND VERIFIED WITH:  Alveria Apley AT [id number] 04/01/20 SDR Performed at [facility], [address] [place], [place] [address]   Respiratory Panel by RT PCR (Flu A&B, Covid) - Nasopharyngeal Swab     Status: None   Collection Time: 04/04/20  6:12 PM   Specimen: Nasopharyngeal Swab  Result Value Ref Range Status   SARS Coronavirus 2 by RT PCR NEGATIVE NEGATIVE Final    Comment: (NOTE) SARS-CoV-2 target nucleic acids are NOT DETECTED. The SARS-CoV-2 RNA is generally detectable in upper respiratoy specimens during the acute phase of infection. The lowest concentration of SARS-CoV-2 viral copies this assay can detect is 131 copies/mL. A negative result does not preclude SARS-Cov-2 infection and should not be used as the sole basis for treatment or other patient management decisions. A negative result may occur with  improper specimen collection/handling, submission of specimen other than nasopharyngeal swab, presence of viral mutation(s) within the areas targeted by this assay, and inadequate number of viral copies (<131 copies/mL). A negative result must be combined with clinical observations, patient history, and epidemiological information. The expected result is Negative. Fact Sheet for Patients:  PinkCheek.be Fact Sheet for Healthcare Providers:  GravelBags.it This test is not yet ap proved or cleared by the [place] FDA and  has been authorized for detection and/or  diagnosis of SARS-CoV-2 by FDA under an Emergency Use Authorization (EUA). This EUA will remain  in effect (meaning this test can be used) for the duration of the COVID-19 declaration under Section 564(b)(1) of the Act, 21 U.S.C. section 360bbb-3(b)(1), unless the authorization is terminated or revoked sooner.    Influenza A by PCR NEGATIVE NEGATIVE Final   Influenza B by PCR NEGATIVE NEGATIVE Final    Comment: (NOTE) The Xpert Xpress SARS-CoV-2/FLU/RSV assay is intended as an aid in  the diagnosis of influenza from Nasopharyngeal swab specimens and  should not be used as a sole basis for treatment. Nasal washings and  aspirates are unacceptable for Xpert Xpress SARS-CoV-2/FLU/RSV  testing. Fact Sheet for Patients: PinkCheek.be Fact Sheet for Healthcare Providers: GravelBags.it This test is not yet approved or cleared by the [place] FDA and  has been authorized for detection and/or diagnosis of SARS-CoV-2 by  FDA under an Emergency Use Authorization (EUA). This EUA will remain  in effect (meaning this test can be used) for the duration of the  Covid-19 declaration under Section 564(b)(1) of the Act, 21  U.S.C. section 360bbb-3(b)(1), unless the authorization is  terminated or revoked. Performed at [facility], [address] [place], [place] [address]     Procedures and diagnostic studies:  No results found.  Medications:   . Chlorhexidine Gluconate Cloth  6 each Topical Daily  . feeding supplement (OSMOLITE 1.5 CAL)  1,000 mL Per Tube Q24H  . finasteride  5 mg Oral Daily  . free water  300 mL Per Tube Q1200  . gabapentin  100 mg Oral BID  . heparin injection (subcutaneous)  5,000 Units Subcutaneous Q8H  .  liver oil-zinc oxide  1 application Topical TID  . terazosin  2 mg Per Tube QHS  . thiamine  100 mg Per Tube Daily  . traZODone  50 mg Per Tube QHS   Continuous Infusions: . cefTRIAXone  (ROCEPHIN)  IV Stopped (04/05/20 0703)     LOS: 0 days   Thomas [name]  [facility]     04/05/2020, 2:45 PM

## 2020-04-05 NOTE — Progress Notes (Signed)
Patient complaining of burning during urination and being unable to urinate. I flushed catheter and got no residual when I pulled back on the syringe. There were clots present in urine and it was amber in color.  Will continue to monitor.  Christene Slates  04/05/2020  5:24 AM

## 2020-04-05 NOTE — Progress Notes (Signed)
Initial Nutrition Assessment  DOCUMENTATION CODES:   Severe malnutrition in context of chronic illness, Underweight  INTERVENTION:  Resume nocturnal feeds of Osmolite 1.5 Cal at 100 mL/hr x 12 hrs (2000-0800). Provides 1800 kcal, 75 grams of protein, 912 mL H2O daily  Flush tube with 120 mL of free water before starting and after completion of nocturnal tube feeds. Flush with an additional 300 mL of free water once time during the day. Provides a total of 1452 mL H2O daily including water in tube feeding.  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  GOAL:   Patient will meet greater than or equal to 90% of their needs  MONITOR:   PO intake, Labs, Weight trends, TF tolerance, I & O's, Skin  REASON FOR ASSESSMENT:   Malnutrition Screening Tool, Consult Enteral/tube feeding initiation and management  ASSESSMENT:   49 year old male with PMHx of congenital esophageal atresia s/p colonic interposition 1976 [facility], s/p gastrocolic & enterocutaneous fistula repair secondary to [facility] 01/08/10 [facility] (injuries included grade V liver laceration, duodenal rupture s/p resection of 8cm small bowel, subdural hemorrhage with traumatic brain injury and right pneumothorax), admitted 06/11/19 with perforation of esophageal graft with communication to the right pleural space and subsequent empyema s/p split fistula formation, TPN from 7/9-7/24, G-J tube placement, thoracotomy/decortication 06/13/19, s/p laparotomy, lower hemi-sternotomy, partial esophagectomy 06/28/19, s/p revision of spit fistula via right anterior thoracotomy on 07/05/2019, recurrent admissions for hypernatremia (1/11-15; 1/23-29; 2/6-11; 2/20-25; 4/24-27) who is now admitted with urinary retention with UTI, G-tube  dysfunction (G-tube was recently replaced by IR on 04/01/2020), and hypernatremia.   -Patient was assessed by NP from Radiology this afternoon. The hub of existing G-tube is cracked but is still functional and can be used. A request has been made for G-tube to be transferred to [facility] for replacement once available.  Met with patient at bedside. He reports that the night of his recent discharge on 4/27 he was able to give his free water flush and feeds per tube. The next evening is when his tube broke and began leaking. He did not receive feeds on the evenings of 4/28 or 4/29. He also did not receive any free water during that time. Patient reports that his new tube feed (Osmolite 1.5 Cal) has not been delivered yet. They did bring a new pump. He reports he gave TwoCal HN per tube on the evening of 4/27.    Of note, RD discussed with Darlin Drop from [employer] over the phone. He reports patient's new tube feed supplies were in fact delivered to the home on the evening of 4/27.   Patient's UBW was 160 lbs a long time ago. According to chart patient was 61.2 kg on 06/11/2019. He is now 35.8 kg (79 lbs). He has lost 25.4 kg (41.5% body weight) over the past 9 months, which is significant for time frame.  Medications reviewed and include: thiamine 100 mg daily, ceftriaxone.  Labs reviewed: Sodium 149, BUN 25.  Discussed with RN.  NUTRITION - FOCUSED PHYSICAL EXAM:    Most Recent Value  Orbital Region  Severe depletion  Upper Arm Region  Severe depletion  Thoracic and Lumbar Region  Severe depletion  Buccal Region  Severe depletion  Temple Region  Severe depletion  Clavicle Bone Region  Severe depletion  Clavicle  and Acromion Bone Region  Severe depletion  Scapular Bone Region  Severe depletion  Dorsal Hand  Severe depletion  Patellar Region  Severe depletion  Anterior Thigh Region  Severe depletion  Posterior Calf Region  Severe depletion  Edema (RD Assessment)  None  Hair  Reviewed  Eyes   Reviewed  Mouth  Reviewed  Skin  Reviewed  Nails  Reviewed     Diet Order:   Diet Order            Diet full liquid Room service appropriate? Yes; Fluid consistency: Thin  Diet effective now             EDUCATION NEEDS:   No education needs have been identified at this time  Skin:  Skin Assessment: Skin Integrity Issues:(wound to buttocks)  Last BM:  04/04/2020  Height:   Ht Readings from Last 1 Encounters:  04/04/20 5' 6"  (1.676 m)   Weight:   Wt Readings from Last 1 Encounters:  04/04/20 35.8 kg   Ideal Body Weight:  59.1 kg  BMI:  Body mass index is 12.75 kg/m.  Estimated Nutritional Needs:   Kcal:  1600-1800  Protein:  70-80 grams  Fluid:  1.2-1.5 L/day  Jacklynn Barnacle, MS, RD, LDN Pager number available on Amion

## 2020-04-05 NOTE — TOC Initial Note (Signed)
Transition of Care [facility]) - Initial/Assessment Note    Patient Details  Name: Thomas [name] MRN: [id number] Date of Birth: [birth year]  Transition of [facility]) CM/SW Contact:    Beverly Sessions, RN Phone Number: 04/05/2020, 4:11 PM  Clinical Narrative:                 Patient readmitted with urinary retention. Patient is from [place], but is staying with his mother in [place]  Previous admission home health liaison was notified notified of discharge. Patient was readmitted within 2 days, so patient was not opened with home health services at that time.   Confirmed with patient he is still agreeable for home health services.  RNCM reached out to [facility] with [place].  He has accepted referral, but Requests that TOC reach back out to him prior to discharge.    PCP Coralie Carpen NP Mother provides transportation [name]  Patient has a rollator and cane in the home the home for ambulation.   RNCM spoke with Leroy Sea from [place] and confirmed that new Joey feeding pump was delivered to the home 4/27 and both patient and mother received education                  Expected Discharge Plan: [place] Barriers to Discharge: Continued Medical Work up   Patient Goals and CMS Choice     Choice offered to / list presented to : Patient  Expected Discharge Plan and Services Expected Discharge Plan: [place]       Living arrangements for the past 2 months: Single Family Home                           HH Arranged: RN, PT [facility] Agency: [place] ([facility]) Date HH Agency Contacted: 04/05/20 Time HH Agency Contacted: 80 Representative spoke with at [place]: Corene Cornea  Prior Living Arrangements/Services Living arrangements for the past 2 months: [place] with:: Parents   Do you feel safe going back to the place where you live?: Yes          Current home services: DME     Activities of Daily Living Home Assistive Devices/Equipment: Cane (specify quad or straight) ADL Screening (condition at time of admission) Patient's cognitive ability adequate to safely complete daily activities?: Yes Is the patient deaf or have difficulty hearing?: No Does the patient have difficulty seeing, even when wearing glasses/contacts?: No Does the patient have difficulty concentrating, remembering, or making decisions?: No Patient able to express need for assistance with ADLs?: Yes Does the patient have difficulty dressing or bathing?: Yes Independently performs ADLs?: Yes (appropriate for developmental age) Does the patient have difficulty walking or climbing stairs?: Yes Weakness of Legs: Both Weakness of Arms/Hands: Both  Permission Sought/Granted                  Emotional Assessment       Orientation: : Oriented to Self, Oriented to Place, Oriented to  Time, Oriented to Situation   Psych Involvement: No (comment)  Admission diagnosis:  Urinary retention [R33.9] Gastrostomy tube dysfunction ([place]) [id number] Urinary tract infection in elderly patient [N39.0] Acute UTI [N39.0] Patient Active Problem List   Diagnosis Date Noted  . Acute UTI 04/05/2020  . Urinary retention 04/04/2020  . UTI (urinary tract infection) 04/04/2020  . Gastrostomy tube dysfunction ([place]) 04/04/2020  . Dehydration   .  Dehydration with hypernatremia 03/12/2020  . Chronic pain syndrome 03/12/2020  . Low grade fever 01/13/2020  . 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 Gastrostomy tube in place ([place]) 01/13/2020  . Protein-calorie malnutrition, severe 12/20/2019  . Right-sided chest pain 01/31/2019  . Anastomotic ulcer 01/31/2019   PCP:  [name], NP Pharmacy:   [facility], [place] [address] [place] [address] Phone: [phone] Fax: [phone]     Social Determinants of Health (SDOH) Interventions    Readmission Risk  Interventions Readmission Risk Prevention Plan 04/05/2020 04/01/2020  Transportation Screening Complete Complete  PCP or Specialist Appt within 3-5 Days - Complete  HRI or [place] - Complete  Social Work Consult for [place] Planning/Counseling - Complete  Palliative Care Screening - Not Applicable  Medication Review Press photographer) Complete Complete  HRI or [place] Not Applicable -  Some recent data might be hidden

## 2020-04-05 NOTE — Progress Notes (Signed)
Patient seen at bedside for gastrostomy tube dysfunction.Patient has a 76 Fr MICK originally placed at OSH on 7.7.20 . Last exchanged and converted from 18 Fr 2.7 cm stoma MIC-KEY to a 20 Fr MIC on 4.26.21. Hub of existing gastrostomy tube is cracked. Due to national backorder the 20 Fr MIC is not currently available at this facility. Request has been made for gastrostomy tube be transferred to this facility. Existing tube is functional and can be used in the interim.  Should patient condition improve and patient be stable enough for discharge procedure can be performed as outpatient.

## 2020-04-06 LAB — URINE CULTURE: Culture: >=100000 — AB

## 2020-04-06 LAB — BASIC METABOLIC PANEL
Anion gap: 6 (ref 5–15)
BUN: 21 mg/dL — ABNORMAL HIGH (ref 6–20)
CO2: 32 mmol/L (ref 22–32)
Calcium: 9.3 mg/dL (ref 8.9–10.3)
Chloride: 107 mmol/L (ref 98–111)
Creatinine, Ser: 0.74 mg/dL (ref 0.61–1.24)
GFR calc Af Amer: >60 mL/min (ref >60)
GFR calc non Af Amer: >60 mL/min (ref >60)
Glucose, Bld: 95 mg/dL (ref 70–99)
Potassium: 3.3 mmol/L — ABNORMAL LOW (ref 3.5–5.1)
Sodium: 145 mmol/L (ref 135–145)

## 2020-04-06 LAB — GLUCOSE, CAPILLARY
Glucose-Capillary: 100 mg/dL — ABNORMAL HIGH (ref 70–99)
Glucose-Capillary: 114 mg/dL — ABNORMAL HIGH (ref 70–99)
Glucose-Capillary: 114 mg/dL — ABNORMAL HIGH (ref 70–99)
Glucose-Capillary: 123 mg/dL — ABNORMAL HIGH (ref 70–99)
Glucose-Capillary: 128 mg/dL — ABNORMAL HIGH (ref 70–99)
Glucose-Capillary: 66 mg/dL — ABNORMAL LOW (ref 70–99)
Glucose-Capillary: 67 mg/dL — ABNORMAL LOW (ref 70–99)
Glucose-Capillary: 94 mg/dL (ref 70–99)
Glucose-Capillary: 95 mg/dL (ref 70–99)

## 2020-04-06 MED ORDER — DEXTROSE 5 % IV SOLN: INTRAVENOUS | Status: DC

## 2020-04-06 MED ORDER — DEXTROSE 50 % IV SOLN
12.5000 g | Freq: Once | INTRAVENOUS | Status: AC
  Administered 2020-04-06: 12.5 g via INTRAVENOUS

## 2020-04-06 MED ORDER — OXYBUTYNIN CHLORIDE 5 MG PO TABS
5.0000 mg | ORAL_TABLET | Freq: Two times a day (BID) | ORAL | Status: DC
  Administered 2020-04-06 – 2020-04-09 (×7): 5 mg
  Filled 2020-04-06 (×7): qty 1

## 2020-04-06 MED ORDER — HEPARIN SODIUM (PORCINE) 5000 UNIT/ML IJ SOLN
5000.0000 [IU] | Freq: Two times a day (BID) | INTRAMUSCULAR | Status: DC
  Administered 2020-04-06 – 2020-04-07 (×3): 5000 [IU] via SUBCUTANEOUS
  Filled 2020-04-06 (×7): qty 1

## 2020-04-06 MED ORDER — DEXTROSE 50 % IV SOLN
12.5000 g | INTRAVENOUS | Status: AC
  Administered 2020-04-06: 12.5 g via INTRAVENOUS
  Filled 2020-04-06: qty 50

## 2020-04-06 MED ORDER — OSMOLITE 1.5 CAL PO LIQD
237.0000 mL | Freq: Every day | ORAL | Status: DC
  Administered 2020-04-06 – 2020-04-09 (×14): 237 mL

## 2020-04-06 MED ORDER — POTASSIUM CHLORIDE 20 MEQ PO PACK
40.0000 meq | PACK | Freq: Once | ORAL | Status: AC
  Administered 2020-04-06: 40 meq via ORAL
  Filled 2020-04-06: qty 2

## 2020-04-06 MED ORDER — FREE WATER
60.0000 mL | Freq: Every day | Status: DC
  Administered 2020-04-06 – 2020-04-09 (×15): 60 mL

## 2020-04-06 MED ORDER — DEXTROSE 50 % IV SOLN
INTRAVENOUS | Status: AC
  Administered 2020-04-06: 25 mL
  Filled 2020-04-06: qty 50
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
## 2020-04-06 NOTE — Progress Notes (Signed)
NUTRITION NOTE  Page received from day shift RN that night shift reported that part of kangaroo pump tubing broke off and remains lodged in G-tube. Due to this, taping of tubing was attempted but was unsuccessful and night shift provided and unknown amount of bolus TF during night shift.   Patient is currently ordered Osmolite 1.5 Cal at 100 mL/hr x 12 hrs (2000-0800). He was assessed at bedside by another RD yesterday afternoon. This RD was able to talk with that RD over the phone earlier this AM about current issue and request from day shift RN to provide bolus TF orders. RD who assessed patient at bedside states that current tube is only a few days old and that patient should tolerate transition to bolus TF without issue and that continuous TF was related to patient preference rather than tolerance issue.   Attempted to call day shift RN back but phone rang continuously without being answered. RN had reported the plan is to fix G-tube on Monday (5/3).   Will place order for 1 carton (237 ml) Osmolite 1.5 x5/day (1185 ml) to provide 1775 kcal, 74 grams protein, and 905 ml free water. Will also order 60 ml free water per TF bolus (30 ml before, 30 ml after each bolus) to provide an additional 300 ml free water/day.       Thomas Matin, MS, RD, LDN, CNSC Inpatient Clinical Dietitian RD pager # available in [place]  After hours/weekend pager # available in [facility]

## 2020-04-06 NOTE — Progress Notes (Signed)
Progress Note    Thomas [name]  [id number] DOB: 1971-02-15  DOA: 04/04/2020 PCP: [name], NP      Brief Narrative:    Medical records reviewed and are as summarized below:  GREORY WALLETT is an 49 y.o. male  with medical history significant for h/o congenital esophageal atresia s/p colonic interposition 1976 [facility], s/p gastrocolic &enterocutaneous fistula repair secondary to [facility] 01/08/10 [facility] (injuries included grade V liver laceration, duodenal rupture s/p resection of 8cm small bowel, subdural hemorrhage with traumatic brain injury and right pneumothorax), admitted 06/11/19 with perforation of esophageal graft with communication to the right pleural space and subsequent empyema s/p split fistula formation, TPN from 7/9-7/24, G-J tube placement, thoracotomy/decortication 06/13/19, s/p laparotomy, lower hemi-sternotomy, partial esophagectomy 06/28/19, s/p revision of spit fistula via right anterior thoracotomy on 07/05/2019 who presented to the ED for evaluation of dysuria and urinary retention. Patient was recently hospitalized from 03/30/2020-04/02/2020 for management of hypernatremia, hypercalcemia, and G-tube dysfunction.  Gastrostomy tube was replaced by interventional radiology on 04/01/2020.  Serum sodium improved from 164-145 and calcium from 11.4-9.0.  He presented to the hospital with dysuria and urinary retention.  He also complained that the hub of the gastrotomy tube, that was recently exchanged, is cracked.   Assessment/Plan:   Principal Problem:   Urinary retention Active Problems:   Dehydration with hypernatremia   UTI (urinary tract infection)   Gastrostomy tube dysfunction (HCC)   Acute UTI  Urinary retention with UTI: Urine culture showed Klebsiella pneumoniae.  Continue IV Rocephin for now.  Patient had blood more clots in his urine today.  He was also leaking urine around his Foley catheter so urologist was consulted to assist with management.  Analgesics as  needed for pain.  Esophageal atresia s/p split fistula and chronic G-tube placement and current G-tube dysfunction: Gastrostomy tube was recently replaced by IR on 04/01/2020. IR was consulted for GT tube dysfunction.  It was suggested that gastrotomy tube can be used for enteral nutrition in the interim.  Request has been made for a new gastrostomy tube to be placed but this will not be done until 04/08/2020 because it is on national backorder.  Hypernatremia: Improved.  Discontinue IV fluids.  Monitor BMP.  Hypokalemia Replete potassium and monitor  Body mass index is 12.75 kg/m.  (Underweight)/severe protein calorie malnutrition Continue enteral nutrition.  Consulted dietitian to assist with management.   Family Communication/Anticipated D/C date and plan/Code Status   DVT prophylaxis: Heparin Code Status: Full code Family Communication: Plan discussed with patient Disposition Plan:   Status is: Inpatient  Remains inpatient appropriate because:Unsafe d/c plan, IV treatments appropriate due to intensity of illness or inability to take PO and Inpatient level of care appropriate due to severity of illness   Dispo: The patient is from: Home              Anticipated d/c is to: Home              Anticipated d/c date is: 3 days              Patient currently is not medically stable to d/c.               Subjective:   Overnight events noted.  Patient had more blood clots in his urine and have been leaking urine around his Foley catheter.  Objective:    Vitals:   04/05/20 2025 04/06/20 0434 04/06/20 0601 04/06/20 1149  BP: 94/69 Marland Kitchen)  85/59 95/60 (!) 98/59  Pulse: (!) 59 (!) 59 (!) 58 70  Resp: 16 14  18   Temp: 97.7 F (36.5 C) (!) 97.5 F (36.4 C)    TempSrc: Oral Oral    SpO2: 100% 100% 100% 96%  Weight:      Height:       No data found.   Intake/Output Summary (Last 24 hours) at 04/06/2020 1610 Last data filed at 04/06/2020 0453 Gross per 24 hour  Intake  --  Output 450 ml  Net -450 ml   Filed Weights   04/04/20 1523  Weight: 35.8 kg    Exam:  GEN: NAD, cachectic SKIN: No rash EYES: EOMI ENT: MMM CV: RRR PULM: CTA B ABD: soft, ND, NT, +BS, + gastrostomy tube, esophagostomy bag on right upper chest CNS: AAO x 3, non focal EXT: No edema or tenderness GU: Foley catheter with dark urine in the urine bag.   Data Reviewed:   I have personally reviewed following labs and imaging studies:  Labs: Labs show the following:   Basic Metabolic Panel: Recent Labs  Lab 03/31/20 0529 03/31/20 0529 04/01/20 [id number] 04/01/20 [id number] 04/02/20 0813 04/02/20 0813 04/04/20 1547 04/04/20 1547 04/05/20 0946 04/06/20 0720  NA 159*  --  145  --   --   --  152*  --  149* 145  K 5.2*   < > 3.3*   < > 4.0   < > 4.1   < > 3.6 3.3*  CL 114*  --  105  --   --   --  110  --  108 107  CO2 33*  --  34*  --   --   --  35*  --  32 32  GLUCOSE 142*  --  116*  --   --   --  106*  --  101* 95  BUN 70*  --  49*  --   --   --  38*  --  25* 21*  CREATININE 1.41*  --  0.96  --   --   --  0.62  --  0.61 0.74  CALCIUM 10.6*  --  9.0  --   --   --  9.9  --  9.6 9.3  MG 3.2*  --   --   --  2.5*  --   --   --   --   --   PHOS 5.1*  --   --   --  3.2  --   --   --   --   --    < > = values in this interval not displayed.   GFR Estimated Creatinine Clearance: 56.6 mL/min (by C-G formula based on SCr of 0.74 mg/dL). Liver Function Tests: Recent Labs  Lab 03/30/20 2107  AST 23  ALT 31  ALKPHOS 69  BILITOT 1.0  PROT 8.5*  ALBUMIN 5.0   No results for input(s): LIPASE, AMYLASE in the last 168 hours. No results for input(s): AMMONIA in the last 168 hours. Coagulation profile No results for input(s): INR, PROTIME in the last 168 hours.  CBC: Recent Labs  Lab 03/30/20 2107 03/31/20 0529 04/04/20 1547 04/05/20 0946  WBC 9.7 9.7 7.7 6.1  NEUTROABS 6.6  --   --   --   HGB 16.3 14.8 13.1 12.5*  HCT 54.5* 50.4 43.4 41.9  MCV 94.9 97.7 95.4 95.9  PLT  253 190 121* 107*   Cardiac Enzymes: No results for input(s):  CKTOTAL, CKMB, CKMBINDEX, TROPONINI in the last 168 hours. BNP (last 3 results) No results for input(s): PROBNP in the last 8760 hours. CBG: Recent Labs  Lab 04/06/20 0020 04/06/20 0432 04/06/20 0552 04/06/20 0748 04/06/20 1143  GLUCAP 114* 66* 114* 128* 100*   D-Dimer: No results for input(s): DDIMER in the last 72 hours. Hgb A1c: No results for input(s): HGBA1C in the last 72 hours. Lipid Profile: No results for input(s): CHOL, HDL, LDLCALC, TRIG, CHOLHDL, LDLDIRECT in the last 72 hours. Thyroid function studies: No results for input(s): TSH, T4TOTAL, T3FREE, THYROIDAB in the last 72 hours.  Invalid input(s): FREET3 Anemia work up: No results for input(s): VITAMINB12, FOLATE, FERRITIN, TIBC, IRON, RETICCTPCT in the last 72 hours. Sepsis Labs: Recent Labs  Lab 03/30/20 2107 03/31/20 0529 04/04/20 1547 04/05/20 0946  WBC 9.7 9.7 7.7 6.1    Microbiology Recent Results (from the past 240 hour(s))  Respiratory Panel by RT PCR (Flu A&B, Covid) - Nasopharyngeal Swab     Status: None   Collection Time: 03/30/20  9:07 PM   Specimen: Nasopharyngeal Swab  Result Value Ref Range Status   SARS Coronavirus 2 by RT PCR NEGATIVE NEGATIVE Final    Comment: (NOTE) SARS-CoV-2 target nucleic acids are NOT DETECTED. The SARS-CoV-2 RNA is generally detectable in upper respiratoy specimens during the acute phase of infection. The lowest concentration of SARS-CoV-2 viral copies this assay can detect is 131 copies/mL. A negative result does not preclude SARS-Cov-2 infection and should not be used as the sole basis for treatment or other patient management decisions. A negative result may occur with  improper specimen collection/handling, submission of specimen other than nasopharyngeal swab, presence of viral mutation(s) within the areas targeted by this assay, and inadequate number of viral copies (<131 copies/mL). A  negative result must be combined with clinical observations, patient history, and epidemiological information. The expected result is Negative. Fact Sheet for Patients:  PinkCheek.be Fact Sheet for Healthcare Providers:  GravelBags.it This test is not yet ap proved or cleared by the [place] FDA and  has been authorized for detection and/or diagnosis of SARS-CoV-2 by FDA under an Emergency Use Authorization (EUA). This EUA will remain  in effect (meaning this test can be used) for the duration of the COVID-19 declaration under Section 564(b)(1) of the Act, 21 U.S.C. section 360bbb-3(b)(1), unless the authorization is terminated or revoked sooner.    Influenza A by PCR NEGATIVE NEGATIVE Final   Influenza B by PCR NEGATIVE NEGATIVE Final    Comment: (NOTE) The Xpert Xpress SARS-CoV-2/FLU/RSV assay is intended as an aid in  the diagnosis of influenza from Nasopharyngeal swab specimens and  should not be used as a sole basis for treatment. Nasal washings and  aspirates are unacceptable for Xpert Xpress SARS-CoV-2/FLU/RSV  testing. Fact Sheet for Patients: PinkCheek.be Fact Sheet for Healthcare Providers: GravelBags.it This test is not yet approved or cleared by the [place] FDA and  has been authorized for detection and/or diagnosis of SARS-CoV-2 by  FDA under an Emergency Use Authorization (EUA). This EUA will remain  in effect (meaning this test can be used) for the duration of the  Covid-19 declaration under Section 564(b)(1) of the Act, 21  U.S.C. section 360bbb-3(b)(1), unless the authorization is  terminated or revoked. Performed at [facility], [address] [place], [place] [address]   MRSA PCR Screening     Status: Abnormal   Collection Time: 04/01/20 11:27 AM   Specimen: Nasopharyngeal  Result Value  Ref Range Status   MRSA by PCR  POSITIVE (A) NEGATIVE Final    Comment:        The GeneXpert MRSA Assay (FDA approved for NASAL specimens only), is one component of a comprehensive MRSA colonization surveillance program. It is not intended to diagnose MRSA infection nor to guide or monitor treatment for MRSA infections. RESULT CALLED TO, READ BACK BY AND VERIFIED WITH:  Alveria Apley AT [id number] 04/01/20 SDR Performed at [facility], [address] [place], [place] [address]   Urine Culture     Status: Abnormal   Collection Time: 04/04/20  3:47 PM   Specimen: Urine, Random  Result Value Ref Range Status   Specimen Description   Final    URINE, RANDOM Performed at [facility], [address] [place], [place] [address]    Special Requests   Final    NONE Performed at [facility], [place], [place] [address]    Culture >=100,000 COLONIES/mL KLEBSIELLA PNEUMONIAE (A)  Final   Report Status 04/06/2020 FINAL  Final   Organism ID, Bacteria KLEBSIELLA PNEUMONIAE (A)  Final      Susceptibility   Klebsiella pneumoniae - MIC*    AMPICILLIN >=32 RESISTANT Resistant     CEFAZOLIN <=4 SENSITIVE Sensitive     CEFTRIAXONE <=1 SENSITIVE Sensitive     CIPROFLOXACIN <=0.25 SENSITIVE Sensitive     GENTAMICIN <=1 SENSITIVE Sensitive     IMIPENEM <=0.25 SENSITIVE Sensitive     NITROFURANTOIN 64 INTERMEDIATE Intermediate     TRIMETH/SULFA <=20 SENSITIVE Sensitive     AMPICILLIN/SULBACTAM 8 SENSITIVE Sensitive     PIP/TAZO <=4 SENSITIVE Sensitive     * >=100,000 COLONIES/mL KLEBSIELLA PNEUMONIAE  Respiratory Panel by RT PCR (Flu A&B, Covid) - Nasopharyngeal Swab     Status: None   Collection Time: 04/04/20  6:12 PM   Specimen: Nasopharyngeal Swab  Result Value Ref Range Status   SARS Coronavirus 2 by RT PCR NEGATIVE NEGATIVE Final    Comment: (NOTE) SARS-CoV-2 target nucleic acids are NOT DETECTED. The SARS-CoV-2 RNA is generally detectable in upper respiratoy specimens during  the acute phase of infection. The lowest concentration of SARS-CoV-2 viral copies this assay can detect is 131 copies/mL. A negative result does not preclude SARS-Cov-2 infection and should not be used as the sole basis for treatment or other patient management decisions. A negative result may occur with  improper specimen collection/handling, submission of specimen other than nasopharyngeal swab, presence of viral mutation(s) within the areas targeted by this assay, and inadequate number of viral copies (<131 copies/mL). A negative result must be combined with clinical observations, patient history, and epidemiological information. The expected result is Negative. Fact Sheet for Patients:  PinkCheek.be Fact Sheet for Healthcare Providers:  GravelBags.it This test is not yet ap proved or cleared by the [place] FDA and  has been authorized for detection and/or diagnosis of SARS-CoV-2 by FDA under an Emergency Use Authorization (EUA). This EUA will remain  in effect (meaning this test can be used) for the duration of the COVID-19 declaration under Section 564(b)(1) of the Act, 21 U.S.C. section 360bbb-3(b)(1), unless the authorization is terminated or revoked sooner.    Influenza A by PCR NEGATIVE NEGATIVE Final   Influenza B by PCR NEGATIVE NEGATIVE Final    Comment: (NOTE) The Xpert Xpress SARS-CoV-2/FLU/RSV assay is intended as an aid in  the diagnosis of influenza from Nasopharyngeal swab specimens and  should not be used as  a sole basis for treatment. Nasal washings and  aspirates are unacceptable for Xpert Xpress SARS-CoV-2/FLU/RSV  testing. Fact Sheet for Patients: PinkCheek.be Fact Sheet for Healthcare Providers: GravelBags.it This test is not yet approved or cleared by the [place] FDA and  has been authorized for detection and/or diagnosis of  SARS-CoV-2 by  FDA under an Emergency Use Authorization (EUA). This EUA will remain  in effect (meaning this test can be used) for the duration of the  Covid-19 declaration under Section 564(b)(1) of the Act, 21  U.S.C. section 360bbb-3(b)(1), unless the authorization is  terminated or revoked. Performed at [facility], [address] [place], [place] [address]     Procedures and diagnostic studies:  No results found.  Medications:   . Chlorhexidine Gluconate Cloth  6 each Topical Daily  . feeding supplement (OSMOLITE 1.5 CAL)  237 mL Per Tube 5 X Daily  . finasteride  5 mg Oral Daily  . free water  60 mL Per Tube 5 X Daily  . gabapentin  100 mg Oral BID  . heparin injection (subcutaneous)  5,000 Units Subcutaneous Q12H  . liver oil-zinc oxide  1 application Topical TID  . oxybutynin  5 mg Per Tube BID  . terazosin  2 mg Per Tube QHS  . thiamine  100 mg Per Tube Daily  . traZODone  50 mg Per Tube QHS   Continuous Infusions: . cefTRIAXone (ROCEPHIN)  IV 1 g (04/06/20 0602)  . dextrose 75 mL/hr at 04/06/20 1159     LOS: 1 day   Maicee Ullman  [facility]     04/06/2020, 4:10 PM

## 2020-04-07 LAB — BASIC METABOLIC PANEL
Anion gap: 4 — ABNORMAL LOW (ref 5–15)
BUN: 20 mg/dL (ref 6–20)
CO2: 32 mmol/L (ref 22–32)
Calcium: 9 mg/dL (ref 8.9–10.3)
Chloride: 110 mmol/L (ref 98–111)
Creatinine, Ser: 0.68 mg/dL (ref 0.61–1.24)
GFR calc Af Amer: >60 mL/min (ref >60)
GFR calc non Af Amer: >60 mL/min (ref >60)
Glucose, Bld: 89 mg/dL (ref 70–99)
Potassium: 4.1 mmol/L (ref 3.5–5.1)
Sodium: 146 mmol/L — ABNORMAL HIGH (ref 135–145)

## 2020-04-07 LAB — GLUCOSE, CAPILLARY
Glucose-Capillary: 87 mg/dL (ref 70–99)
Glucose-Capillary: 79 mg/dL (ref 70–99)
Glucose-Capillary: 80 mg/dL (ref 70–99)
Glucose-Capillary: 85 mg/dL (ref 70–99)
Glucose-Capillary: 100 mg/dL — ABNORMAL HIGH (ref 70–99)

## 2020-04-07 LAB — MAGNESIUM: Magnesium: 2.5 mg/dL — ABNORMAL HIGH (ref 1.7–2.4)

## 2020-04-07 MED ORDER — CEPHALEXIN 500 MG PO CAPS: 500.0000 mg | ORAL_CAPSULE | Freq: Two times a day (BID) | ORAL | Status: DC

## 2020-04-07 MED ORDER — SULFAMETHOXAZOLE-TRIMETHOPRIM 800-160 MG PO TABS
1.0000 | ORAL_TABLET | Freq: Two times a day (BID) | ORAL | Status: DC
  Administered 2020-04-08 – 2020-04-09 (×3): 1 via ORAL
  Filled 2020-04-07 (×4): qty 1

## 2020-04-07 MED ORDER — SODIUM CHLORIDE 0.9 % IV BOLUS: 1000.0000 mL | Freq: Once | INTRAVENOUS | Status: DC

## 2020-04-07 MED ORDER — SODIUM CHLORIDE 0.9 % IV BOLUS
500.0000 mL | Freq: Once | INTRAVENOUS | Status: AC
  Administered 2020-04-07: 500 mL via INTRAVENOUS

## 2020-04-07 NOTE — Progress Notes (Signed)
Hypoglycemic Event  CBG: 67  Treatment: dextrose 50% solution 12.5g  Symptoms: none  Follow-up CBG: Time: 2102 CBG Result: 94  Possible Reasons for Event: Anything patient takes in by mouth comes out through esophageal fistula  Comments/MD notified: Rufina Falco, NP    Thomas [name]

## 2020-04-07 NOTE — Progress Notes (Signed)
Progress Note    Thomas [name]  [id number] DOB: [birth year]  DOA: 04/04/2020 PCP: [name], NP      Brief Narrative:    Medical records reviewed and are as summarized below:  Thomas [name] is an 49 y.o. male  with medical history significant for h/o congenital esophageal atresia s/p colonic interposition 1976 [facility], s/p gastrocolic &enterocutaneous fistula repair secondary to [facility] 01/08/10 [facility] (injuries included grade V liver laceration, duodenal rupture s/p resection of 8cm small bowel, subdural hemorrhage with traumatic brain injury and right pneumothorax), admitted 06/11/19 with perforation of esophageal graft with communication to the right pleural space and subsequent empyema s/p split fistula formation, TPN from 7/9-7/24, G-J tube placement, thoracotomy/decortication 06/13/19, s/p laparotomy, lower hemi-sternotomy, partial esophagectomy 06/28/19, s/p revision of spit fistula via right anterior thoracotomy on 07/05/2019 who presented to the ED for evaluation of dysuria and urinary retention. Patient was recently hospitalized from 03/30/2020-04/02/2020 for management of hypernatremia, hypercalcemia, and G-tube dysfunction.  Gastrostomy tube was replaced by interventional radiology on 04/01/2020.  Serum sodium improved from 164-145 and calcium from 11.4-9.0.  He presented to the hospital with dysuria and urinary retention.  He also complained that the hub of the gastrotomy tube, that was recently exchanged, is cracked.   Assessment/Plan:   Principal Problem:   Urinary retention Active Problems:   Dehydration with hypernatremia   UTI (urinary tract infection)   Gastrostomy tube dysfunction (HCC)   Acute UTI  Urinary retention with UTI: Urine culture showed Klebsiella pneumoniae.  Continue IV Rocephin for now.  Change to oral Bactrim tomorrow.  (Capsules can't be given via PEG tube).  Patient has been evaluated by urologist.  Oxybutynin was prescribed. analgesics as needed for  pain.  Esophageal atresia s/p split fistula and chronic G-tube placement and current G-tube dysfunction: Gastrostomy tube was recently replaced by IR on 04/01/2020. IR was consulted for GT tube dysfunction.  It was suggested that gastrotomy tube can be used for enteral nutrition in the interim.  Request has been made for a new gastrostomy tube to be placed but this will not be done until 04/08/2020 because it is on national backorder.  Hypotension: Ordered normal saline bolus.  Monitor BP closely.   Hypernatremia: Improved.    Hypokalemia Improved  Body mass index is 13.91 kg/m.  (Underweight)/severe protein calorie malnutrition Continue enteral nutrition.  Consulted dietitian to assist with management.   Family Communication/Anticipated D/C date and plan/Code Status   DVT prophylaxis: Heparin Code Status: Full code Family Communication: Plan discussed with patient Disposition Plan:   Status is: Inpatient  Remains inpatient appropriate because:Unsafe d/c plan, IV treatments appropriate due to intensity of illness or inability to take PO and Inpatient level of care appropriate due to severity of illness   Dispo: The patient is from: Home              Anticipated d/c is to: Home              Anticipated d/c date is: 1 day              Patient currently is not medically stable to d/c.               Subjective:   He still has some dysuria.  He said urine is still leaking around Foley catheter.  Objective:    Vitals:   04/06/20 1149 04/06/20 1953 04/07/20 0424 04/07/20 1226  BP: (!) 98/59 110/63 (!) 92/45 (!) 85/53  Pulse:  70 (!) 58 (!) 57 (!) 59  Resp: 18 16 20    Temp:  97.7 F (36.5 C) (!) 97.5 F (36.4 C) 97.7 F (36.5 C)  TempSrc:  Oral Oral   SpO2: 96% (!) 88% 99% 97%  Weight:   39.1 kg   Height:       No data found.   Intake/Output Summary (Last 24 hours) at 04/07/2020 1520 Last data filed at 04/07/2020 0300 Gross per 24 hour  Intake --  Output  775 ml  Net -775 ml   Filed Weights   04/04/20 1523 04/07/20 0424  Weight: 35.8 kg 39.1 kg    Exam:  GEN: NAD, cachectic SKIN: No rash EYES: No pallor or icterus ENT: MMM CV: RRR PULM: CTA B ABD: soft, ND, NT, +BS, + gastrostomy tube, esophagostomy bag on right upper chest CNS: AAO x 3, non focal EXT: No edema or tenderness GU: Foley catheter with dark urine in the urine bag.   Data Reviewed:   I have personally reviewed following labs and imaging studies:  Labs: Labs show the following:   Basic Metabolic Panel: Recent Labs  Lab 04/01/20 0623 04/01/20 0623 04/02/20 0813 04/02/20 0813 04/04/20 1547 04/04/20 1547 04/05/20 0946 04/05/20 0946 04/06/20 0720 04/07/20 0849  NA 145  --   --   --  152*  --  149*  --  145 146*  K 3.3*   < > 4.0   < > 4.1   < > 3.6   < > 3.3* 4.1  CL 105  --   --   --  110  --  108  --  107 110  CO2 34*  --   --   --  35*  --  32  --  32 32  GLUCOSE 116*  --   --   --  106*  --  101*  --  95 89  BUN 49*  --   --   --  38*  --  25*  --  21* 20  CREATININE 0.96  --   --   --  0.62  --  0.61  --  0.74 0.68  CALCIUM 9.0  --   --   --  9.9  --  9.6  --  9.3 9.0  MG  --   --  2.5*  --   --   --   --   --   --  2.5*  PHOS  --   --  3.2  --   --   --   --   --   --   --    < > = values in this interval not displayed.   GFR Estimated Creatinine Clearance: 61.8 mL/min (by C-G formula based on SCr of 0.68 mg/dL). Liver Function Tests: No results for input(s): AST, ALT, ALKPHOS, BILITOT, PROT, ALBUMIN in the last 168 hours. No results for input(s): LIPASE, AMYLASE in the last 168 hours. No results for input(s): AMMONIA in the last 168 hours. Coagulation profile No results for input(s): INR, PROTIME in the last 168 hours.  CBC: Recent Labs  Lab 04/04/20 1547 04/05/20 0946  WBC 7.7 6.1  HGB 13.1 12.5*  HCT 43.4 41.9  MCV 95.4 95.9  PLT 121* 107*   Cardiac Enzymes: No results for input(s): CKTOTAL, CKMB, CKMBINDEX, TROPONINI in the  last 168 hours. BNP (last 3 results) No results for input(s): PROBNP in the last 8760 hours. CBG: Recent Labs  Lab 04/06/20  2102 04/06/20 2354 04/07/20 0420 04/07/20 0757 04/07/20 1224  GLUCAP 94 123* 87 79 80   D-Dimer: No results for input(s): DDIMER in the last 72 hours. Hgb A1c: No results for input(s): HGBA1C in the last 72 hours. Lipid Profile: No results for input(s): CHOL, HDL, LDLCALC, TRIG, CHOLHDL, LDLDIRECT in the last 72 hours. Thyroid function studies: No results for input(s): TSH, T4TOTAL, T3FREE, THYROIDAB in the last 72 hours.  Invalid input(s): FREET3 Anemia work up: No results for input(s): VITAMINB12, FOLATE, FERRITIN, TIBC, IRON, RETICCTPCT in the last 72 hours. Sepsis Labs: Recent Labs  Lab 04/04/20 1547 04/05/20 0946  WBC 7.7 6.1    Microbiology Recent Results (from the past 240 hour(s))  Respiratory Panel by RT PCR (Flu A&B, Covid) - Nasopharyngeal Swab     Status: None   Collection Time: 03/30/20  9:07 PM   Specimen: Nasopharyngeal Swab  Result Value Ref Range Status   SARS Coronavirus 2 by RT PCR NEGATIVE NEGATIVE Final    Comment: (NOTE) SARS-CoV-2 target nucleic acids are NOT DETECTED. The SARS-CoV-2 RNA is generally detectable in upper respiratoy specimens during the acute phase of infection. The lowest concentration of SARS-CoV-2 viral copies this assay can detect is 131 copies/mL. A negative result does not preclude SARS-Cov-2 infection and should not be used as the sole basis for treatment or other patient management decisions. A negative result may occur with  improper specimen collection/handling, submission of specimen other than nasopharyngeal swab, presence of viral mutation(s) within the areas targeted by this assay, and inadequate number of viral copies (<131 copies/mL). A negative result must be combined with clinical observations, patient history, and epidemiological information. The expected result is Negative. Fact Sheet  for Patients:  PinkCheek.be Fact Sheet for Healthcare Providers:  GravelBags.it This test is not yet ap proved or cleared by the [place] FDA and  has been authorized for detection and/or diagnosis of SARS-CoV-2 by FDA under an Emergency Use Authorization (EUA). This EUA will remain  in effect (meaning this test can be used) for the duration of the COVID-19 declaration under Section 564(b)(1) of the Act, 21 U.S.C. section 360bbb-3(b)(1), unless the authorization is terminated or revoked sooner.    Influenza A by PCR NEGATIVE NEGATIVE Final   Influenza B by PCR NEGATIVE NEGATIVE Final    Comment: (NOTE) The Xpert Xpress SARS-CoV-2/FLU/RSV assay is intended as an aid in  the diagnosis of influenza from Nasopharyngeal swab specimens and  should not be used as a sole basis for treatment. Nasal washings and  aspirates are unacceptable for Xpert Xpress SARS-CoV-2/FLU/RSV  testing. Fact Sheet for Patients: PinkCheek.be Fact Sheet for Healthcare Providers: GravelBags.it This test is not yet approved or cleared by the [place] FDA and  has been authorized for detection and/or diagnosis of SARS-CoV-2 by  FDA under an Emergency Use Authorization (EUA). This EUA will remain  in effect (meaning this test can be used) for the duration of the  Covid-19 declaration under Section 564(b)(1) of the Act, 21  U.S.C. section 360bbb-3(b)(1), unless the authorization is  terminated or revoked. Performed at [facility], [address] [place], [place] [address]   MRSA PCR Screening     Status: Abnormal   Collection Time: 04/01/20 11:27 AM   Specimen: Nasopharyngeal  Result Value Ref Range Status   MRSA by PCR POSITIVE (A) NEGATIVE Final    Comment:        The GeneXpert MRSA Assay (FDA approved for NASAL specimens only), is one  component of a comprehensive MRSA  colonization surveillance program. It is not intended to diagnose MRSA infection nor to guide or monitor treatment for MRSA infections. RESULT CALLED TO, READ BACK BY AND VERIFIED WITH:  Alveria Apley AT [id number] 04/01/20 SDR Performed at [facility], [address] [place], [place] [address]   Urine Culture     Status: Abnormal   Collection Time: 04/04/20  3:47 PM   Specimen: Urine, Random  Result Value Ref Range Status   Specimen Description   Final    URINE, RANDOM Performed at [facility], [address] [place], [place] [address]    Special Requests   Final    NONE Performed at [facility], [place], [place] [address]    Culture >=100,000 COLONIES/mL KLEBSIELLA PNEUMONIAE (A)  Final   Report Status 04/06/2020 FINAL  Final   Organism ID, Bacteria KLEBSIELLA PNEUMONIAE (A)  Final      Susceptibility   Klebsiella pneumoniae - MIC*    AMPICILLIN >=32 RESISTANT Resistant     CEFAZOLIN <=4 SENSITIVE Sensitive     CEFTRIAXONE <=1 SENSITIVE Sensitive     CIPROFLOXACIN <=0.25 SENSITIVE Sensitive     GENTAMICIN <=1 SENSITIVE Sensitive     IMIPENEM <=0.25 SENSITIVE Sensitive     NITROFURANTOIN 64 INTERMEDIATE Intermediate     TRIMETH/SULFA <=20 SENSITIVE Sensitive     AMPICILLIN/SULBACTAM 8 SENSITIVE Sensitive     PIP/TAZO <=4 SENSITIVE Sensitive     * >=100,000 COLONIES/mL KLEBSIELLA PNEUMONIAE  Respiratory Panel by RT PCR (Flu A&B, Covid) - Nasopharyngeal Swab     Status: None   Collection Time: 04/04/20  6:12 PM   Specimen: Nasopharyngeal Swab  Result Value Ref Range Status   SARS Coronavirus 2 by RT PCR NEGATIVE NEGATIVE Final    Comment: (NOTE) SARS-CoV-2 target nucleic acids are NOT DETECTED. The SARS-CoV-2 RNA is generally detectable in upper respiratoy specimens during the acute phase of infection. The lowest concentration of SARS-CoV-2 viral copies this assay can detect is 131 copies/mL. A negative result does not  preclude SARS-Cov-2 infection and should not be used as the sole basis for treatment or other patient management decisions. A negative result may occur with  improper specimen collection/handling, submission of specimen other than nasopharyngeal swab, presence of viral mutation(s) within the areas targeted by this assay, and inadequate number of viral copies (<131 copies/mL). A negative result must be combined with clinical observations, patient history, and epidemiological information. The expected result is Negative. Fact Sheet for Patients:  PinkCheek.be Fact Sheet for Healthcare Providers:  GravelBags.it This test is not yet ap proved or cleared by the [place] FDA and  has been authorized for detection and/or diagnosis of SARS-CoV-2 by FDA under an Emergency Use Authorization (EUA). This EUA will remain  in effect (meaning this test can be used) for the duration of the COVID-19 declaration under Section 564(b)(1) of the Act, 21 U.S.C. section 360bbb-3(b)(1), unless the authorization is terminated or revoked sooner.    Influenza A by PCR NEGATIVE NEGATIVE Final   Influenza B by PCR NEGATIVE NEGATIVE Final    Comment: (NOTE) The Xpert Xpress SARS-CoV-2/FLU/RSV assay is intended as an aid in  the diagnosis of influenza from Nasopharyngeal swab specimens and  should not be used as a sole basis for treatment. Nasal washings and  aspirates are unacceptable for Xpert Xpress SARS-CoV-2/FLU/RSV  testing. Fact Sheet for Patients: PinkCheek.be Fact Sheet for Healthcare Providers: GravelBags.it This test is not yet approved  or cleared by the [place] and  has been authorized for detection and/or diagnosis of SARS-CoV-2 by  FDA under an Emergency Use Authorization (EUA). This EUA will remain  in effect (meaning this test can be used) for the duration of the    Covid-19 declaration under Section 564(b)(1) of the Act, 21  U.S.C. section 360bbb-3(b)(1), unless the authorization is  terminated or revoked. Performed at [facility], [address] [place], [place] [address]     Procedures and diagnostic studies:  No results found.  Medications:   . Chlorhexidine Gluconate Cloth  6 each Topical Daily  . feeding supplement (OSMOLITE 1.5 CAL)  237 mL Per Tube 5 X Daily  . finasteride  5 mg Oral Daily  . free water  60 mL Per Tube 5 X Daily  . gabapentin  100 mg Oral BID  . heparin injection (subcutaneous)  5,000 Units Subcutaneous Q12H  . liver oil-zinc oxide  1 application Topical TID  . oxybutynin  5 mg Per Tube BID  . [START ON 04/08/2020] sulfamethoxazole-trimethoprim  1 tablet Oral Q12H  . terazosin  2 mg Per Tube QHS  . thiamine  100 mg Per Tube Daily  . traZODone  50 mg Per Tube QHS   Continuous Infusions: . sodium chloride       LOS: 2 days   Paityn Balsam  [facility]     04/07/2020, 3:20 PM

## 2020-04-08 ENCOUNTER — Inpatient Hospital Stay: Payer: Medicaid Other

## 2020-04-08 LAB — BASIC METABOLIC PANEL
Anion gap: 4 — ABNORMAL LOW (ref 5–15)
BUN: 19 mg/dL (ref 6–20)
CO2: 30 mmol/L (ref 22–32)
Calcium: 9.3 mg/dL (ref 8.9–10.3)
Chloride: 112 mmol/L — ABNORMAL HIGH (ref 98–111)
Creatinine, Ser: 0.7 mg/dL (ref 0.61–1.24)
GFR calc Af Amer: >60 mL/min (ref >60)
GFR calc non Af Amer: >60 mL/min (ref >60)
Glucose, Bld: 81 mg/dL (ref 70–99)
Potassium: 4.3 mmol/L (ref 3.5–5.1)
Sodium: 146 mmol/L — ABNORMAL HIGH (ref 135–145)

## 2020-04-08 LAB — MAGNESIUM: Magnesium: 2.6 mg/dL — ABNORMAL HIGH (ref 1.7–2.4)

## 2020-04-08 LAB — GLUCOSE, CAPILLARY
Glucose-Capillary: 103 mg/dL — ABNORMAL HIGH (ref 70–99)
Glucose-Capillary: 78 mg/dL (ref 70–99)
Glucose-Capillary: 83 mg/dL (ref 70–99)
Glucose-Capillary: 83 mg/dL (ref 70–99)
Glucose-Capillary: 85 mg/dL (ref 70–99)
Glucose-Capillary: 94 mg/dL (ref 70–99)

## 2020-04-08 MED ORDER — COMMODE 3-IN-1 MISC: 0 refills | Status: DC

## 2020-04-08 MED ORDER — KETOROLAC TROMETHAMINE 30 MG/ML IJ SOLN
15.0000 mg | Freq: Once | INTRAMUSCULAR | Status: AC
  Administered 2020-04-08: 15 mg via INTRAVENOUS
  Filled 2020-04-08: qty 1

## 2020-04-08 MED ORDER — IOHEXOL 300 MG/ML  SOLN
50.0000 mL | Freq: Once | INTRAMUSCULAR | Status: AC
  Administered 2020-04-08: 11:00:00 30 mL

## 2020-04-08 MED ORDER — DEXTROSE 5 % IV SOLN: INTRAVENOUS | Status: DC

## 2020-04-08 MED ORDER — SODIUM CHLORIDE 0.9 % IV SOLN: INTRAVENOUS | Status: DC
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
## 2020-04-08 NOTE — Progress Notes (Signed)
Progress Note    Thomas [name]  [id number] DOB: [birth year]  DOA: 04/04/2020 PCP: [name], NP      Brief Narrative:    Medical records reviewed and are as summarized below:  Thomas [name] is an 49 y.o. male  with medical history significant for h/o congenital esophageal atresia s/p colonic interposition 1976 [facility], s/p gastrocolic &enterocutaneous fistula repair secondary to [facility] 01/08/10 [facility] (injuries included grade V liver laceration, duodenal rupture s/p resection of 8cm small bowel, subdural hemorrhage with traumatic brain injury and right pneumothorax), admitted 06/11/19 with perforation of esophageal graft with communication to the right pleural space and subsequent empyema s/p split fistula formation, TPN from 7/9-7/24, G-J tube placement, thoracotomy/decortication 06/13/19, s/p laparotomy, lower hemi-sternotomy, partial esophagectomy 06/28/19, s/p revision of spit fistula via right anterior thoracotomy on 07/05/2019 who presented to the ED for evaluation of dysuria and urinary retention. Patient was recently hospitalized from 03/30/2020-04/02/2020 for management of hypernatremia, hypercalcemia, and G-tube dysfunction.  Gastrostomy tube was replaced by interventional radiology on 04/01/2020.  Serum sodium improved from 164-145 and calcium from 11.4-9.0.  He presented to the hospital with dysuria and urinary retention.  He also complained that the hub of the gastrotomy tube, that was recently exchanged, is cracked.   Assessment/Plan:   Principal Problem:   Urinary retention Active Problems:   Dehydration with hypernatremia   UTI (urinary tract infection)   Gastrostomy tube dysfunction (HCC)   Acute UTI  Urinary retention with UTI: Urine culture showed Klebsiella pneumoniae.  Continue Bactrim. Patient has been evaluated by urologist.  Dr. Bernardo Heater recommended that Foley be kept in place for 7 days.  Oxybutynin was prescribed. Analgesics as needed for pain.  Esophageal  atresia s/p split fistula and chronic G-tube placement and current G-tube dysfunction: Gastrostomy tube was successfully exchanged at the bedside by IR on 04/08/2020.  Hypotension: Improved.  Continue IV fluids.  It appears patient's blood pressure generally runs low because of low body weight.     Hypernatremia, mild: Continue IV fluids.  Patient has had a lot of output from his esophagostomy which is likely contributing to recurrent hypernatremia and low blood pressure  Hypokalemia Improved  Debility PT recommended home health therapy.  Body mass index is 13.91 kg/m.  (Underweight)/severe protein calorie malnutrition Continue enteral nutrition.  Consulted dietitian to assist with management.   Family Communication/Anticipated D/C date and plan/Code Status   DVT prophylaxis: Heparin Code Status: Full code Family Communication: Plan discussed with patient Disposition Plan:   Status is: Inpatient  Remains inpatient appropriate because:Unsafe d/c plan, IV treatments appropriate due to intensity of illness or inability to take PO and Inpatient level of care appropriate due to severity of illness   Dispo: The patient is from: Home              Anticipated d/c is to: Home              Anticipated d/c date is: 1 day              Patient currently is not medically stable to d/c.  Patient said he did not feel ready to go home today because he thinks that the last time he was discharged from the hospital, he was discharged too soon.  He said he came back to the hospital 2 days after he was discharged.  He agrees to go home tomorrow if he is feeling better.  Subjective:   C/o generalized weakness and dysuria. He doesn't want to be discharged too soon because the last time he came back to the hospital 2 days after discharge from the hospital  Objective:    Vitals:   04/07/20 0424 04/07/20 1226 04/07/20 1922 04/08/20 0341  BP: (!) 92/45 (!) 85/53 (!) 87/55 (!)  90/51  Pulse: (!) 57 (!) 59 (!) 57 (!) 54  Resp: 20  14 16   Temp: (!) 97.5 F (36.4 C) 97.7 F (36.5 C) 98.5 F (36.9 C) (!) 97.4 F (36.3 C)  TempSrc: Oral  Axillary Axillary  SpO2: 99% 97% 100% 99%  Weight: 39.1 kg     Height:       No data found.   Intake/Output Summary (Last 24 hours) at 04/08/2020 0927 Last data filed at 04/08/2020 0353 Gross per 24 hour  Intake 237 ml  Output 1400 ml  Net -1163 ml   Filed Weights   04/04/20 1523 04/07/20 0424  Weight: 35.8 kg 39.1 kg    Exam:  GEN: NAD, cachectic SKIN: No rash EYES: No pallor or icterus ENT: MMM CV: RRR PULM: b/l rhonchi, no rales ABD: soft, ND, NT, +BS, + gastrostomy tube, esophagostomy bag on right upper chest CNS: AAO x 3, non focal EXT: No edema or tenderness GU: Foley catheter with dark urine in the urine bag.   Data Reviewed:   I have personally reviewed following labs and imaging studies:  Labs: Labs show the following:   Basic Metabolic Panel: Recent Labs  Lab 04/02/20 0813 04/02/20 0813 04/04/20 1547 04/04/20 1547 04/05/20 0946 04/05/20 0946 04/06/20 0720 04/07/20 0849  NA  --   --  152*  --  149*  --  145 146*  K 4.0   < > 4.1   < > 3.6   < > 3.3* 4.1  CL  --   --  110  --  108  --  107 110  CO2  --   --  35*  --  32  --  32 32  GLUCOSE  --   --  106*  --  101*  --  95 89  BUN  --   --  38*  --  25*  --  21* 20  CREATININE  --   --  0.62  --  0.61  --  0.74 0.68  CALCIUM  --   --  9.9  --  9.6  --  9.3 9.0  MG 2.5*  --   --   --   --   --   --  2.5*  PHOS 3.2  --   --   --   --   --   --   --    < > = values in this interval not displayed.   GFR Estimated Creatinine Clearance: 61.8 mL/min (by C-G formula based on SCr of 0.68 mg/dL). Liver Function Tests: No results for input(s): AST, ALT, ALKPHOS, BILITOT, PROT, ALBUMIN in the last 168 hours. No results for input(s): LIPASE, AMYLASE in the last 168 hours. No results for input(s): AMMONIA in the last 168 hours. Coagulation  profile No results for input(s): INR, PROTIME in the last 168 hours.  CBC: Recent Labs  Lab 04/04/20 1547 04/05/20 0946  WBC 7.7 6.1  HGB 13.1 12.5*  HCT 43.4 41.9  MCV 95.4 95.9  PLT 121* 107*   Cardiac Enzymes: No results for input(s): CKTOTAL, CKMB, CKMBINDEX, TROPONINI in the last 168 hours. BNP (  last 3 results) No results for input(s): PROBNP in the last 8760 hours. CBG: Recent Labs  Lab 04/07/20 1637 04/07/20 1921 04/08/20 0017 04/08/20 0338 04/08/20 0747  GLUCAP 85 100* 83 83 78   D-Dimer: No results for input(s): DDIMER in the last 72 hours. Hgb A1c: No results for input(s): HGBA1C in the last 72 hours. Lipid Profile: No results for input(s): CHOL, HDL, LDLCALC, TRIG, CHOLHDL, LDLDIRECT in the last 72 hours. Thyroid function studies: No results for input(s): TSH, T4TOTAL, T3FREE, THYROIDAB in the last 72 hours.  Invalid input(s): FREET3 Anemia work up: No results for input(s): VITAMINB12, FOLATE, FERRITIN, TIBC, IRON, RETICCTPCT in the last 72 hours. Sepsis Labs: Recent Labs  Lab 04/04/20 1547 04/05/20 0946  WBC 7.7 6.1    Microbiology Recent Results (from the past 240 hour(s))  Respiratory Panel by RT PCR (Flu A&B, Covid) - Nasopharyngeal Swab     Status: None   Collection Time: 03/30/20  9:07 PM   Specimen: Nasopharyngeal Swab  Result Value Ref Range Status   SARS Coronavirus 2 by RT PCR NEGATIVE NEGATIVE Final    Comment: (NOTE) SARS-CoV-2 target nucleic acids are NOT DETECTED. The SARS-CoV-2 RNA is generally detectable in upper respiratoy specimens during the acute phase of infection. The lowest concentration of SARS-CoV-2 viral copies this assay can detect is 131 copies/mL. A negative result does not preclude SARS-Cov-2 infection and should not be used as the sole basis for treatment or other patient management decisions. A negative result may occur with  improper specimen collection/handling, submission of specimen other than  nasopharyngeal swab, presence of viral mutation(s) within the areas targeted by this assay, and inadequate number of viral copies (<131 copies/mL). A negative result must be combined with clinical observations, patient history, and epidemiological information. The expected result is Negative. Fact Sheet for Patients:  PinkCheek.be Fact Sheet for Healthcare Providers:  GravelBags.it This test is not yet ap proved or cleared by the [place] FDA and  has been authorized for detection and/or diagnosis of SARS-CoV-2 by FDA under an Emergency Use Authorization (EUA). This EUA will remain  in effect (meaning this test can be used) for the duration of the COVID-19 declaration under Section 564(b)(1) of the Act, 21 U.S.C. section 360bbb-3(b)(1), unless the authorization is terminated or revoked sooner.    Influenza A by PCR NEGATIVE NEGATIVE Final   Influenza B by PCR NEGATIVE NEGATIVE Final    Comment: (NOTE) The Xpert Xpress SARS-CoV-2/FLU/RSV assay is intended as an aid in  the diagnosis of influenza from Nasopharyngeal swab specimens and  should not be used as a sole basis for treatment. Nasal washings and  aspirates are unacceptable for Xpert Xpress SARS-CoV-2/FLU/RSV  testing. Fact Sheet for Patients: PinkCheek.be Fact Sheet for Healthcare Providers: GravelBags.it This test is not yet approved or cleared by the [place] FDA and  has been authorized for detection and/or diagnosis of SARS-CoV-2 by  FDA under an Emergency Use Authorization (EUA). This EUA will remain  in effect (meaning this test can be used) for the duration of the  Covid-19 declaration under Section 564(b)(1) of the Act, 21  U.S.C. section 360bbb-3(b)(1), unless the authorization is  terminated or revoked. Performed at [facility], [address] [place], [place] [address]   MRSA  PCR Screening     Status: Abnormal   Collection Time: 04/01/20 11:27 AM   Specimen: Nasopharyngeal  Result Value Ref Range Status   MRSA by PCR POSITIVE (A) NEGATIVE Final  Comment:        The GeneXpert MRSA Assay (FDA approved for NASAL specimens only), is one component of a comprehensive MRSA colonization surveillance program. It is not intended to diagnose MRSA infection nor to guide or monitor treatment for MRSA infections. RESULT CALLED TO, READ BACK BY AND VERIFIED WITH:  Alveria Apley AT [id number] 04/01/20 SDR Performed at [facility], [address] [place], [place] [address]   Urine Culture     Status: Abnormal   Collection Time: 04/04/20  3:47 PM   Specimen: Urine, Random  Result Value Ref Range Status   Specimen Description   Final    URINE, RANDOM Performed at [facility], [address] [place], [place] [address]    Special Requests   Final    NONE Performed at [facility], [place], [place] [address]    Culture >=100,000 COLONIES/mL KLEBSIELLA PNEUMONIAE (A)  Final   Report Status 04/06/2020 FINAL  Final   Organism ID, Bacteria KLEBSIELLA PNEUMONIAE (A)  Final      Susceptibility   Klebsiella pneumoniae - MIC*    AMPICILLIN >=32 RESISTANT Resistant     CEFAZOLIN <=4 SENSITIVE Sensitive     CEFTRIAXONE <=1 SENSITIVE Sensitive     CIPROFLOXACIN <=0.25 SENSITIVE Sensitive     GENTAMICIN <=1 SENSITIVE Sensitive     IMIPENEM <=0.25 SENSITIVE Sensitive     NITROFURANTOIN 64 INTERMEDIATE Intermediate     TRIMETH/SULFA <=20 SENSITIVE Sensitive     AMPICILLIN/SULBACTAM 8 SENSITIVE Sensitive     PIP/TAZO <=4 SENSITIVE Sensitive     * >=100,000 COLONIES/mL KLEBSIELLA PNEUMONIAE  Respiratory Panel by RT PCR (Flu A&B, Covid) - Nasopharyngeal Swab     Status: None   Collection Time: 04/04/20  6:12 PM   Specimen: Nasopharyngeal Swab  Result Value Ref Range Status   SARS Coronavirus 2 by RT PCR NEGATIVE NEGATIVE Final     Comment: (NOTE) SARS-CoV-2 target nucleic acids are NOT DETECTED. The SARS-CoV-2 RNA is generally detectable in upper respiratoy specimens during the acute phase of infection. The lowest concentration of SARS-CoV-2 viral copies this assay can detect is 131 copies/mL. A negative result does not preclude SARS-Cov-2 infection and should not be used as the sole basis for treatment or other patient management decisions. A negative result may occur with  improper specimen collection/handling, submission of specimen other than nasopharyngeal swab, presence of viral mutation(s) within the areas targeted by this assay, and inadequate number of viral copies (<131 copies/mL). A negative result must be combined with clinical observations, patient history, and epidemiological information. The expected result is Negative. Fact Sheet for Patients:  PinkCheek.be Fact Sheet for Healthcare Providers:  GravelBags.it This test is not yet ap proved or cleared by the [place] FDA and  has been authorized for detection and/or diagnosis of SARS-CoV-2 by FDA under an Emergency Use Authorization (EUA). This EUA will remain  in effect (meaning this test can be used) for the duration of the COVID-19 declaration under Section 564(b)(1) of the Act, 21 U.S.C. section 360bbb-3(b)(1), unless the authorization is terminated or revoked sooner.    Influenza A by PCR NEGATIVE NEGATIVE Final   Influenza B by PCR NEGATIVE NEGATIVE Final    Comment: (NOTE) The Xpert Xpress SARS-CoV-2/FLU/RSV assay is intended as an aid in  the diagnosis of influenza from Nasopharyngeal swab specimens and  should not be used as a sole basis for treatment. Nasal washings and  aspirates are unacceptable for Xpert Xpress SARS-CoV-2/FLU/RSV  testing. Fact Sheet for Patients: PinkCheek.be Fact Sheet for Healthcare  Providers: GravelBags.it This test is not yet approved or cleared by the [place] FDA and  has been authorized for detection and/or diagnosis of SARS-CoV-2 by  FDA under an Emergency Use Authorization (EUA). This EUA will remain  in effect (meaning this test can be used) for the duration of the  Covid-19 declaration under Section 564(b)(1) of the Act, 21  U.S.C. section 360bbb-3(b)(1), unless the authorization is  terminated or revoked. Performed at [facility], [address] [place], [place] [address]     Procedures and diagnostic studies:  No results found.  Medications:   . Chlorhexidine Gluconate Cloth  6 each Topical Daily  . feeding supplement (OSMOLITE 1.5 CAL)  237 mL Per Tube 5 X Daily  . finasteride  5 mg Oral Daily  . free water  60 mL Per Tube 5 X Daily  . gabapentin  100 mg Oral BID  . heparin injection (subcutaneous)  5,000 Units Subcutaneous Q12H  . liver oil-zinc oxide  1 application Topical TID  . oxybutynin  5 mg Per Tube BID  . sulfamethoxazole-trimethoprim  1 tablet Oral Q12H  . terazosin  2 mg Per Tube QHS  . thiamine  100 mg Per Tube Daily  . traZODone  50 mg Per Tube QHS   Continuous Infusions: . sodium chloride       LOS: 3 days   Micaiah Remillard  [facility]     04/08/2020, 9:27 AM

## 2020-04-08 NOTE — TOC Progression Note (Signed)
Transition of Care [facility]) - Progression Note    Patient Details  Name: ANIRUDH GIVINS MRN: [id number] Date of Birth: 05-Oct-1971  Transition of Care [facility]) CM/SW Contact  Shelbie Ammons, RN Phone Number: 04/08/2020, 2:59 PM  Clinical Narrative:   RNCM called into room to speak with patient regarding recommendation for toilet riser or 3n1, patient is agreeable to either and reports whichever the insurance will pay for. Contacted Kathy with [employer] and she will deliver equipment to patient.    Expected Discharge Plan: [place] Barriers to Discharge: Continued Medical Work up  Expected Discharge Plan and Services Expected Discharge Plan: [place] arrangements for the past 2 months: Single Family Home                           HH Arranged: RN, PT [facility] Agency: [place] ([facility]) Date HH Agency Contacted: 04/05/20 Time [place]: [phone] Representative spoke with at [place]: [place] ([place]) Interventions    Readmission Risk Interventions Readmission Risk Prevention Plan 04/05/2020 04/01/2020  Transportation Screening Complete Complete  PCP or Specialist Appt within 3-5 Days - Complete  HRI or [place] - Complete  Social Work Consult for [place] Planning/Counseling - Complete  Palliative Care Screening - Not Applicable  Medication Review Press photographer) Complete Complete  HRI or [place] Not Applicable -  Some recent data might be hidden

## 2020-04-08 NOTE — Progress Notes (Signed)
Physical Therapy Evaluation Patient Details Name: Thomas [name] MRN: [id number] DOB: May 02, 1971 Today's Date: 04/08/2020   History of Present Illness  Thomas [name] is a 49 y.o. male with medical history significant for h/o congenital esophageal atresia s/p colonic interposition 1976 [facility], s/p gastrocolic &enterocutaneous fistula repair secondary to [facility] 01/08/10 [facility] (injuries included grade V liver laceration, duodenal rupture s/p resection of 8cm small bowel, subdural hemorrhage with traumatic brain injury and right pneumothorax), admitted 06/11/19 with perforation of esophageal graft with communication to the right pleural space and subsequent empyema s/p split fistula formation, TPN from 7/9-7/24, G-J tube placement, thoracotomy/decortication 06/13/19, s/p laparotomy, lower hemi-sternotomy, partial esophagectomy 06/28/19, s/p revision of spit fistula via right anterior thoracotomy on 07/05/2019 who presented to the [facility] ED for evaluation of dysuria and urinary retention. Patient was recently hospitalized from 03/30/2020-04/02/2020 for management of hypernatremia, hypercalcemia, and G-tube dysfunction. Gastrostomy tube was replaced by interventional radiology on 04/01/2020. Patient went home and was initially doing fine however he began having dysuria and decreased urine output. He also noted the attachment into the feeding portion of his G-tube broke off into the attachment site and he has been unable to take a start piece out and therefore has not been able to feed today.  He says he does still have some oral intake with liquids and output to the attached ostomy bag at his chest wall.  He administers his medications via his G-tube. He is now admitted for urinary retention with UTI as well as current G-tube dysfunction, hypotension, hypernatremia, and hypokalemia.    Clinical Impression  Pt admitted with above diagnosis. Pt currently with functional limitations due to the deficits listed below (see PT Problem  List).  Pt is modified independent for bed mobility however requires additional time and effort. However he is able to perform supine<>sit without external assistance. With transfers pt moves slowly but can transfer to standing without external assistance. He is stable in standing with BUE support on rolling walker. Able to perform marching in place with BUE support and no LOB. He is able to perform forward/backward stepping at EOB with rolling walker and CGA. No LOB with BUE support on rolling walker. Unable to ambulate farther at this time because pt reports that his g-tube is starting to pull and he needs it taped down by RN. Pt is safe to discharge home with support from his mother and [facility] PT. He needs supervision for all ambulation due to history of multiple falls. He should use a rolling walker for all ambulation at home. Pt requesting a toilet riser to use at his mother's house and advised him that it is likely insurance would not cover and pt states that he would not be able to pay out of pocket. If insurance doesn't cover toilet riser recommend BSC at discharge. Pt will benefit from PT services to address deficits in strength, balance, and mobility in order to return to full function at home.       Follow Up Recommendations Home health PT;Supervision for mobility/OOB    Equipment Recommendations  3in1 (PT)(Pt requesting toilet riser instead of BSC if covered)    Recommendations for Other Services       Precautions / Restrictions Precautions Precautions: Fall Precaution Comments: multiple tubes/drains Restrictions Weight Bearing Restrictions: No      Mobility  Bed Mobility Overal bed mobility: Modified Independent             General bed mobility comments: additional time. effort, but able to  perform supine<>sit without external assistance  Transfers Overall transfer level: Needs assistance Equipment used: Rolling walker (2 wheeled) Transfers: Sit to/from Stand Sit to  Stand: Min guard         General transfer comment: Pt moves slowly but can transfer to standing without external assistance. He is stable in standing with BUE support on rolling walker. Able to perform marching in place with BUE support and no LOB  Ambulation/Gait Ambulation/Gait assistance: Min guard Gait Distance (Feet): 3 Feet Assistive device: Rolling walker (2 wheeled)       General Gait Details: Pt able to perform forward/backward stepping at EOB with rolling walker and CGA. No LOB with BUE support on rolling walker. Unable to ambulate farther at this time because pt reports that his g-tube is starting to pull and he needs it taped down by Catering manager    Modified Rankin (Stroke Patients Only)       Balance Overall balance assessment: History of Falls(Pt requires BUE support in standing with therapist today)                                           Pertinent Vitals/Pain Pain Assessment: 0-10 Pain Score: 9  Pain Location: "Stomach and back of legs" Pain Descriptors / Indicators: Dull;Aching Pain Intervention(s): Patient requesting pain meds-RN notified    Home Living Family/patient expects to be discharged to:: Private residence Living Arrangements: Parent Available Help at Discharge: Family;Available PRN/intermittently Type of Home: House Home Access: Ramped entrance;Level entry     Home Layout: One level Home Equipment: [place] - 2 wheels;Cane - single point      Prior Function Level of Independence: Needs assistance   Gait / Transfers Assistance Needed: Pt ambulates with spc at home and RW when out in the community. Feeling more unsteady in the last couple weeks  ADL's / Homemaking Assistance Needed: intermittent need for assist with dressing bathing toiletting , line/drainage management  Comments: Reports his girlfriend can't help out, she goes to HD on MWF. Most help is from mom.      Hand  Dominance   Dominant Hand: Right    Extremity/Trunk Assessment   Upper Extremity Assessment Upper Extremity Assessment: Generalized weakness    Lower Extremity Assessment Lower Extremity Assessment: Generalized weakness       Communication   Communication: No difficulties  Cognition Arousal/Alertness: Awake/alert Behavior During Therapy: WFL for tasks assessed/performed Overall Cognitive Status: Within Functional Limits for tasks assessed                                        General Comments      Exercises General Exercises - Lower Extremity Ankle Circles/Pumps: Both;10 reps Quad Sets: Both;10 reps Gluteal Sets: Both;10 reps Short Arc Quad: Both;10 reps Hip ABduction/ADduction: Both;10 reps Straight Leg Raises: Both;10 reps   Assessment/Plan    PT Assessment Patient needs continued PT services  PT Problem List Decreased strength;Decreased activity tolerance;Decreased balance;Decreased mobility       PT Treatment Interventions DME instruction;Gait training;Functional mobility training;Therapeutic activities;Therapeutic exercise;Balance training;Patient/family education;Wheelchair mobility training    PT Goals (Current goals can be found in the Care Plan section)  Acute Rehab PT Goals Patient Stated Goal: Improve strength/activity  tolerance PT Goal Formulation: With patient Time For Goal Achievement: 04/22/20 Potential to Achieve Goals: Poor    Frequency Min 2X/week   Barriers to discharge        Co-evaluation               AM-PAC PT "6 Clicks" Mobility  Outcome Measure Help needed turning from your back to your side while in a flat bed without using bedrails?: None Help needed moving from lying on your back to sitting on the side of a flat bed without using bedrails?: None Help needed moving to and from a bed to a chair (including a wheelchair)?: A Little Help needed standing up from a chair using your arms (e.g., wheelchair or  bedside chair)?: A Little Help needed to walk in hospital room?: A Little Help needed climbing 3-5 steps with a railing? : A Lot 6 Click Score: 19    End of Session   Activity Tolerance: Patient limited by fatigue Patient left: in bed;with bed alarm set;with call bell/phone within reach Nurse Communication: Other (comment)(Pt awaiting his tube feeding) PT Visit Diagnosis: Unsteadiness on feet (R26.81);Other abnormalities of gait and mobility (R26.89);Muscle weakness (generalized) (M62.81);Difficulty in walking, not elsewhere classified (R26.2);Adult, failure to thrive (R62.7)    Time: 1040-1130 PT Time Calculation (min) (ACUTE ONLY): 50 min   Charges:   PT Evaluation $PT Eval High Complexity: 1 High PT Treatments $Therapeutic Exercise: 8-22 mins        Lyndel Safe Reilly Blades PT, DPT, GCS   Gerrit Rafalski 04/08/2020, 12:31 PM

## 2020-04-09 LAB — GLUCOSE, CAPILLARY
Glucose-Capillary: 132 mg/dL — ABNORMAL HIGH (ref 70–99)
Glucose-Capillary: 48 mg/dL — ABNORMAL LOW (ref 70–99)
Glucose-Capillary: 54 mg/dL — ABNORMAL LOW (ref 70–99)
Glucose-Capillary: 55 mg/dL — ABNORMAL LOW (ref 70–99)
Glucose-Capillary: 59 mg/dL — ABNORMAL LOW (ref 70–99)
Glucose-Capillary: 69 mg/dL — ABNORMAL LOW (ref 70–99)
Glucose-Capillary: 75 mg/dL (ref 70–99)
Glucose-Capillary: 78 mg/dL (ref 70–99)
Glucose-Capillary: 93 mg/dL (ref 70–99)

## 2020-04-09 LAB — BASIC METABOLIC PANEL
Anion gap: 2 — ABNORMAL LOW (ref 5–15)
BUN: 17 mg/dL (ref 6–20)
CO2: 32 mmol/L (ref 22–32)
Calcium: 8.8 mg/dL — ABNORMAL LOW (ref 8.9–10.3)
Chloride: 109 mmol/L (ref 98–111)
Creatinine, Ser: 0.71 mg/dL (ref 0.61–1.24)
GFR calc Af Amer: >60 mL/min (ref >60)
GFR calc non Af Amer: >60 mL/min (ref >60)
Glucose, Bld: 200 mg/dL — ABNORMAL HIGH (ref 70–99)
Potassium: 4.1 mmol/L (ref 3.5–5.1)
Sodium: 143 mmol/L (ref 135–145)

## 2020-04-09 LAB — GLUCOSE, RANDOM: Glucose, Bld: 201 mg/dL — ABNORMAL HIGH (ref 70–99)

## 2020-04-09 LAB — MAGNESIUM: Magnesium: 2.5 mg/dL — ABNORMAL HIGH (ref 1.7–2.4)

## 2020-04-09 IMAGING — DX DG ABD PORTABLE 1V
1 series · 1 of 1 positions shown · non-contrast
Comparison: CT abdomen and pelvis 07/07/2019 and earlier.

CLINICAL DATA: 48-year-old with an indwelling gastrostomy tube.

EXAM:
PORTABLE ABDOMEN - 1 VIEW

[abdomen kub]
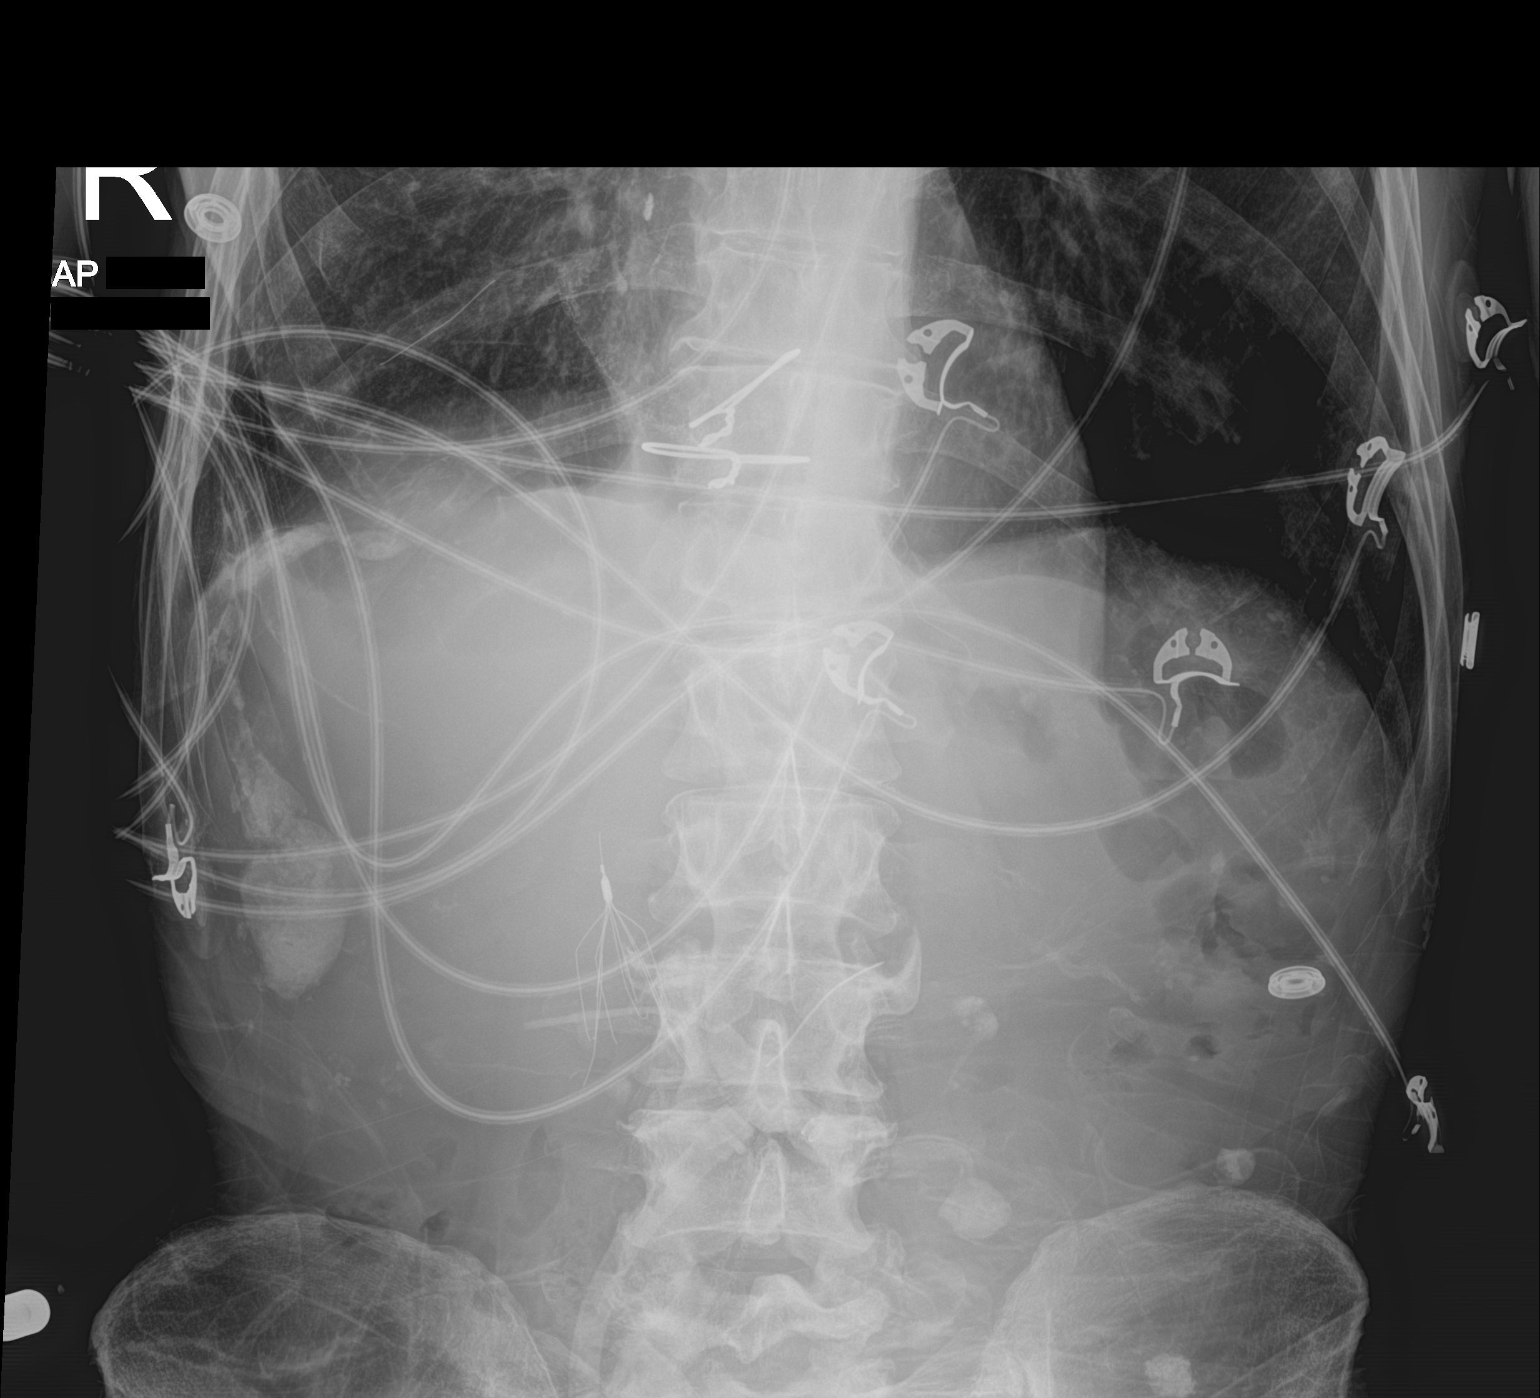

[1 of 1 positions shown; findings below may reference images not displayed]

FINDINGS: The gastrostomy tube projects over the expected location of the mid
body of the stomach. There is no jejunal catheter currently as was
seen on the prior CT 07/07/2019. Visualized upper abdominal bowel
gas pattern unremarkable. Extravasated contrast material/barium
throughout the visualized upper abdomen as noted on that CT. IVC
filter as noted previously.
IMPRESSION: Gastrostomy tube projects over the expected location of the mid body
of the stomach.

## 2020-04-09 IMAGING — DX DG CHEST 1V PORT
1 series · 1 of 1 positions shown · non-contrast
Comparison: 08/17/2019 and earlier including CT chest 08/07/2019.
COMPARISON: 08/17/2019 and earlier including CT chest 08/07/2019.

Addendum:
CLINICAL DATA: 48-year-old presenting with extreme anxiety and
possible panic attack.

EXAM:
PORTABLE CHEST 1 VIEW

[chest ap]
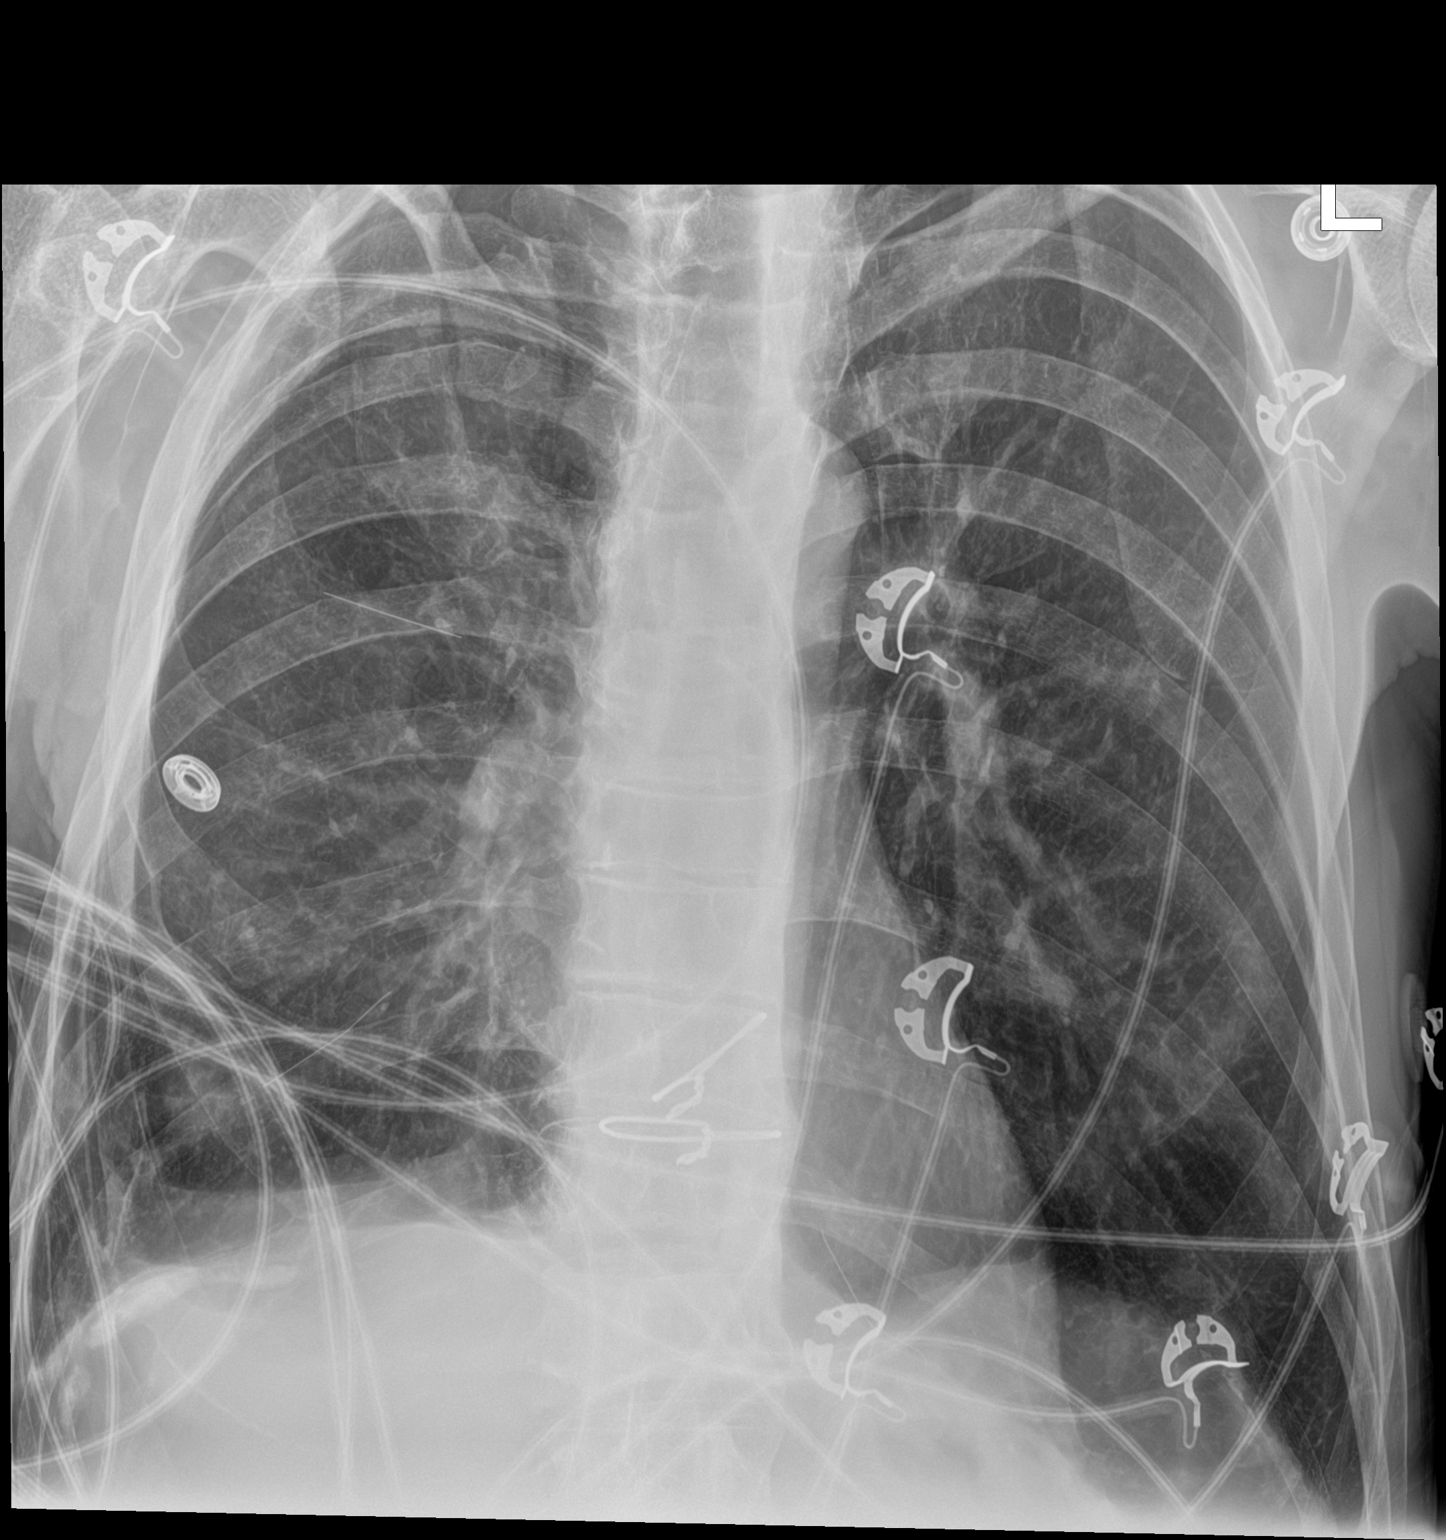

[1 of 1 positions shown; findings below may reference images not displayed]

FINDINGS: Cardiomediastinal silhouette unremarkable and unchanged.
Hyperinflation and emphysematous changes throughout both lungs as
noted previously. Chronic pleuroparenchymal scarring at the RIGHT
base at the site of a prior pneumonia. Lungs otherwise clear.
Pulmonary vascularity normal. No visible pleural effusions
currently. Calcifications beneath the RIGHT hemidiaphragm shown on
prior CT to likely involve the liver capsule.
IMPRESSION: No acute cardiopulmonary disease. COPD/emphysema. Chronic
pleuroparenchymal scarring at the RIGHT base.

ADDENDUM:
After viewing the abdominal x-ray obtained concurrently, the
apparent calcifications beneath the RIGHT hemidiaphragm in fact
represents extravasated contrast material/barium which is present
throughout the upper abdomen.

*** End of Addendum ***
FINDINGS: Cardiomediastinal silhouette unremarkable and unchanged.
Hyperinflation and emphysematous changes throughout both lungs as
noted previously. Chronic pleuroparenchymal scarring at the RIGHT
base at the site of a prior pneumonia. Lungs otherwise clear.
Pulmonary vascularity normal. No visible pleural effusions
currently. Calcifications beneath the RIGHT hemidiaphragm shown on
prior CT to likely involve the liver capsule.
IMPRESSION: No acute cardiopulmonary disease. COPD/emphysema. Chronic
pleuroparenchymal scarring at the RIGHT base.

## 2020-04-09 MED ORDER — DEXTROSE 50 % IV SOLN: 25.0000 g | INTRAVENOUS | Status: AC

## 2020-04-09 MED ORDER — DEXTROSE 50 % IV SOLN
INTRAVENOUS | Status: AC
  Filled 2020-04-09: qty 50

## 2020-04-09 MED ORDER — DEXTROSE 50 % IV SOLN
25.0000 mL | Freq: Once | INTRAVENOUS | Status: AC
  Administered 2020-04-09: 25 mL via INTRAVENOUS

## 2020-04-09 MED ORDER — SULFAMETHOXAZOLE-TRIMETHOPRIM 800-160 MG PO TABS: 1.0000 | ORAL_TABLET | Freq: Two times a day (BID) | ORAL | 0 refills | Status: AC

## 2020-04-09 MED ORDER — SULFAMETHOXAZOLE-TRIMETHOPRIM 800-160 MG PO TABS
1.0000 | ORAL_TABLET | Freq: Two times a day (BID) | ORAL | 0 refills | Status: DC
Start: 2020-04-09 — End: 2020-04-09

## 2020-04-09 MED ORDER — DEXTROSE 50 % IV SOLN
INTRAVENOUS | Status: AC
  Administered 2020-04-09: 25 mL
  Filled 2020-04-09: qty 50

## 2020-04-09 MED ORDER — DEXTROSE 50 % IV SOLN
INTRAVENOUS | Status: AC
  Administered 2020-04-09: 25 g via INTRAVENOUS
  Filled 2020-04-09: qty 50

## 2020-04-09 NOTE — Progress Notes (Signed)
Hypoglycemic Event  CBG: 55  Treatment: 1/2 amp D50  Symptoms: none  Possible Reasons for Event: unsure; IVF D5 at 50 ml/hr  Comments/MD notified: continue to monitor Barbaraann Faster, RN 7:19 AM 04/09/2020

## 2020-04-09 NOTE — Progress Notes (Signed)
Discharge instructions reviewed with patient and mother including followup visits and new medications.  Understanding was verbalized and all questions were answered.  IV removed without complication; patient tolerated well.  Patient discharged home via wheelchair in stable condition escorted by nursing staff.

## 2020-04-09 NOTE — Discharge Summary (Signed)
Physician Discharge Summary  Thomas [name] [id number] DOB: 1971/01/20 DOA: 04/04/2020  PCP: [name], NP  Admit date: 04/04/2020 Discharge date: 04/09/2020  Discharge disposition: Home with home health care   Recommendations for Outpatient Follow-Up:   Outpatient follow-up with PCP in 1 weeks   Discharge Diagnosis:   Principal Problem:   Urinary retention Active Problems:   Dehydration with hypernatremia   UTI (urinary tract infection)   Gastrostomy tube dysfunction ([place])   Acute UTI    Discharge Condition: Stable.  Diet recommendation: Full liquid diet and tube feeding via GT tube  Code status: Full code    Hospital Course:   Thomas [name] is an 49 y.o. male with medical history significant forh/o congenital esophageal atresia s/p colonic interposition 1976 [facility], s/p gastrocolic &enterocutaneous fistula repair secondary to [facility] 01/08/10 [facility] (injuries included grade V liver laceration, duodenal rupture s/p resection of 8cm small bowel, subdural hemorrhage with traumatic brain injury and right pneumothorax), admitted 06/11/19 with perforation of esophageal graft with communication to the right pleural space and subsequent empyema s/p split fistula formation, TPN from 7/9-7/24, G-J tube placement, thoracotomy/decortication 06/13/19, s/p laparotomy, lower hemi-sternotomy, partial esophagectomy 06/28/19, s/p revision of spit fistula via right anterior thoracotomy on 7/29/2020who presented to the ED for evaluation of dysuria and urinary retention. Patient was recently hospitalized from 03/30/2020-04/02/2020 for management of hypernatremia, hypercalcemia, and G-tube dysfunction. Gastrostomy tube was replaced by interventional radiology on 04/01/2020. Serum sodium improved from 164-145 and calcium from 11.4-9.0.  He presented to the hospital with dysuria and urinary retention.  He also complained that the hub of the gastrotomy tube, that was recently exchanged was  cracked.  He was admitted to the hospital for acute urinary retention with urinary tract infection.  Foley catheter was placed and was treated with empiric IV antibiotics.  He was subsequently transitioned to oral Bactrim based on urine culture results (Klebsiella pneumoniae was isolated).  He was evaluated by the urologist who recommended that Foley catheter be kept for 7 days.  He was also treated with IV fluids for hypotension and hypernatremia.  It appears his blood pressure generally runs low.  He was seen in consultation by the interventional radiologist because of GT tube dysfunction.  GT tube was exchanged and he tolerated enteral nutrition.  Fingersticks showed episodes of hypoglycemia.  Fingerstick/glucometer readings were probably inaccurate because he has very cold fingers.  However, glucose levels from BMP did not show hypoglycemia  Patient was at increased risk for readmission because of underlying comorbidities.  The importance of adequate hydration was emphasized because he is susceptible to dehydration from high output from esophagostomy.   Discharge Exam:   Vitals:   04/08/20 1958 04/09/20 0609  BP: (!) 90/47 (!) 96/55  Pulse: (!) 51 (!) 59  Resp: 16 18  Temp:  97.6 F (36.4 C)  SpO2: 99% 100%   Vitals:   04/08/20 1116 04/08/20 1222 04/08/20 1958 04/09/20 0609  BP: (!) 94/58 116/61 (!) 90/47 (!) 96/55  Pulse: (!) 57 65 (!) 51 (!) 59  Resp: 18  16 18   Temp: 97.8 F (36.6 C)   97.6 F (36.4 C)  TempSrc: Oral   Oral  SpO2: 100% 90% 99% 100%  Weight:      Height:         GEN: NAD, cachectic SKIN: No rash EYES: No pallor or icterus ENT: MMM CV: RRR PULM:  Clear to auscultation bilaterally ABD: soft, ND, NT, +BS, + gastrostomy tube, esophagostomy bag  on right upper chest CNS: AAO x 3, non focal EXT: No edema or tenderness GU: Foley catheter with amber urine in the urine bag.    The results of significant diagnostics from this hospitalization (including  imaging, microbiology, ancillary and laboratory) are listed below for reference.     Procedures and Diagnostic Studies:   No results found.   Labs:   Basic Metabolic Panel: Recent Labs  Lab 04/05/20 0946 04/05/20 0946 04/06/20 0720 04/06/20 0720 04/07/20 0849 04/07/20 0849 04/08/20 1121 04/09/20 0523  NA 149*  --  145  --  146*  --  146* 143  K 3.6   < > 3.3*   < > 4.1   < > 4.3 4.1  CL 108  --  107  --  110  --  112* 109  CO2 32  --  32  --  32  --  30 32  GLUCOSE 101*  --  95  --  89  --  81 200*  201*  BUN 25*  --  21*  --  20  --  19 17  CREATININE 0.61  --  0.74  --  0.68  --  0.70 0.71  CALCIUM 9.6  --  9.3  --  9.0  --  9.3 8.8*  MG  --   --   --   --  2.5*  --  2.6* 2.5*   < > = values in this interval not displayed.   GFR Estimated Creatinine Clearance: 61.8 mL/min (by C-G formula based on SCr of 0.71 mg/dL). Liver Function Tests: No results for input(s): AST, ALT, ALKPHOS, BILITOT, PROT, ALBUMIN in the last 168 hours. No results for input(s): LIPASE, AMYLASE in the last 168 hours. No results for input(s): AMMONIA in the last 168 hours. Coagulation profile No results for input(s): INR, PROTIME in the last 168 hours.  CBC: Recent Labs  Lab 04/04/20 1547 04/05/20 0946  WBC 7.7 6.1  HGB 13.1 12.5*  HCT 43.4 41.9  MCV 95.4 95.9  PLT 121* 107*   Cardiac Enzymes: No results for input(s): CKTOTAL, CKMB, CKMBINDEX, TROPONINI in the last 168 hours. BNP: Invalid input(s): POCBNP CBG: Recent Labs  Lab 04/09/20 0703 04/09/20 0742 04/09/20 0926 04/09/20 0933 04/09/20 1007  GLUCAP 59* 75 35* 48* 132*   D-Dimer No results for input(s): DDIMER in the last 72 hours. Hgb A1c No results for input(s): HGBA1C in the last 72 hours. Lipid Profile No results for input(s): CHOL, HDL, LDLCALC, TRIG, CHOLHDL, LDLDIRECT in the last 72 hours. Thyroid function studies No results for input(s): TSH, T4TOTAL, T3FREE, THYROIDAB in the last 72 hours.  Invalid  input(s): FREET3 Anemia work up No results for input(s): VITAMINB12, FOLATE, FERRITIN, TIBC, IRON, RETICCTPCT in the last 72 hours. Microbiology Recent Results (from the past 240 hour(s))  Respiratory Panel by RT PCR (Flu A&B, Covid) - Nasopharyngeal Swab     Status: None   Collection Time: 03/30/20  9:07 PM   Specimen: Nasopharyngeal Swab  Result Value Ref Range Status   SARS Coronavirus 2 by RT PCR NEGATIVE NEGATIVE Final    Comment: (NOTE) SARS-CoV-2 target nucleic acids are NOT DETECTED. The SARS-CoV-2 RNA is generally detectable in upper respiratoy specimens during the acute phase of infection. The lowest concentration of SARS-CoV-2 viral copies this assay can detect is 131 copies/mL. A negative result does not preclude SARS-Cov-2 infection and should not be used as the sole basis for treatment or other patient management decisions. A negative  result may occur with  improper specimen collection/handling, submission of specimen other than nasopharyngeal swab, presence of viral mutation(s) within the areas targeted by this assay, and inadequate number of viral copies (<131 copies/mL). A negative result must be combined with clinical observations, patient history, and epidemiological information. The expected result is Negative. Fact Sheet for Patients:  PinkCheek.be Fact Sheet for Healthcare Providers:  GravelBags.it This test is not yet ap proved or cleared by the [place] FDA and  has been authorized for detection and/or diagnosis of SARS-CoV-2 by FDA under an Emergency Use Authorization (EUA). This EUA will remain  in effect (meaning this test can be used) for the duration of the COVID-19 declaration under Section 564(b)(1) of the Act, 21 U.S.C. section 360bbb-3(b)(1), unless the authorization is terminated or revoked sooner.    Influenza A by PCR NEGATIVE NEGATIVE Final   Influenza B by PCR NEGATIVE NEGATIVE  Final    Comment: (NOTE) The Xpert Xpress SARS-CoV-2/FLU/RSV assay is intended as an aid in  the diagnosis of influenza from Nasopharyngeal swab specimens and  should not be used as a sole basis for treatment. Nasal washings and  aspirates are unacceptable for Xpert Xpress SARS-CoV-2/FLU/RSV  testing. Fact Sheet for Patients: PinkCheek.be Fact Sheet for Healthcare Providers: GravelBags.it This test is not yet approved or cleared by the [place] FDA and  has been authorized for detection and/or diagnosis of SARS-CoV-2 by  FDA under an Emergency Use Authorization (EUA). This EUA will remain  in effect (meaning this test can be used) for the duration of the  Covid-19 declaration under Section 564(b)(1) of the Act, 21  U.S.C. section 360bbb-3(b)(1), unless the authorization is  terminated or revoked. Performed at [facility], [address] [place], [place] [address]   MRSA PCR Screening     Status: Abnormal   Collection Time: 04/01/20 11:27 AM   Specimen: Nasopharyngeal  Result Value Ref Range Status   MRSA by PCR POSITIVE (A) NEGATIVE Final    Comment:        The GeneXpert MRSA Assay (FDA approved for NASAL specimens only), is one component of a comprehensive MRSA colonization surveillance program. It is not intended to diagnose MRSA infection nor to guide or monitor treatment for MRSA infections. RESULT CALLED TO, READ BACK BY AND VERIFIED WITH:  Alveria Apley AT [id number] 04/01/20 SDR Performed at [facility], [address], [place], [place] [address]   Urine Culture     Status: Abnormal   Collection Time: 04/04/20  3:47 PM   Specimen: Urine, Random  Result Value Ref Range Status   Specimen Description   Final    URINE, RANDOM Performed at [facility], [address] [place], [place] [address]    Special Requests   Final    NONE Performed at [facility], [address] [place], [place] [address]    Culture >=100,000 COLONIES/mL KLEBSIELLA PNEUMONIAE (A)  Final   Report Status 04/06/2020 FINAL  Final   Organism ID, Bacteria KLEBSIELLA PNEUMONIAE (A)  Final      Susceptibility   Klebsiella pneumoniae - MIC*    AMPICILLIN >=32 RESISTANT Resistant     CEFAZOLIN <=4 SENSITIVE Sensitive     CEFTRIAXONE <=1 SENSITIVE Sensitive     CIPROFLOXACIN <=0.25 SENSITIVE Sensitive     GENTAMICIN <=1 SENSITIVE Sensitive     IMIPENEM <=0.25 SENSITIVE Sensitive     NITROFURANTOIN 64 INTERMEDIATE Intermediate     TRIMETH/SULFA <=20 SENSITIVE Sensitive  AMPICILLIN/SULBACTAM 8 SENSITIVE Sensitive     PIP/TAZO <=4 SENSITIVE Sensitive     * >=100,000 COLONIES/mL KLEBSIELLA PNEUMONIAE  Respiratory Panel by RT PCR (Flu A&B, Covid) - Nasopharyngeal Swab     Status: None   Collection Time: 04/04/20  6:12 PM   Specimen: Nasopharyngeal Swab  Result Value Ref Range Status   SARS Coronavirus 2 by RT PCR NEGATIVE NEGATIVE Final    Comment: (NOTE) SARS-CoV-2 target nucleic acids are NOT DETECTED. The SARS-CoV-2 RNA is generally detectable in upper respiratoy specimens during the acute phase of infection. The lowest concentration of SARS-CoV-2 viral copies this assay can detect is 131 copies/mL. A negative result does not preclude SARS-Cov-2 infection and should not be used as the sole basis for treatment or other patient management decisions. A negative result may occur with  improper specimen collection/handling, submission of specimen other than nasopharyngeal swab, presence of viral mutation(s) within the areas targeted by this assay, and inadequate number of viral copies (<131 copies/mL). A negative result must be combined with clinical observations, patient history, and epidemiological information. The expected result is Negative. Fact Sheet for Patients:  PinkCheek.be Fact Sheet for Healthcare Providers:   GravelBags.it This test is not yet ap proved or cleared by the [place] FDA and  has been authorized for detection and/or diagnosis of SARS-CoV-2 by FDA under an Emergency Use Authorization (EUA). This EUA will remain  in effect (meaning this test can be used) for the duration of the COVID-19 declaration under Section 564(b)(1) of the Act, 21 U.S.C. section 360bbb-3(b)(1), unless the authorization is terminated or revoked sooner.    Influenza A by PCR NEGATIVE NEGATIVE Final   Influenza B by PCR NEGATIVE NEGATIVE Final    Comment: (NOTE) The Xpert Xpress SARS-CoV-2/FLU/RSV assay is intended as an aid in  the diagnosis of influenza from Nasopharyngeal swab specimens and  should not be used as a sole basis for treatment. Nasal washings and  aspirates are unacceptable for Xpert Xpress SARS-CoV-2/FLU/RSV  testing. Fact Sheet for Patients: PinkCheek.be Fact Sheet for Healthcare Providers: GravelBags.it This test is not yet approved or cleared by the [place] FDA and  has been authorized for detection and/or diagnosis of SARS-CoV-2 by  FDA under an Emergency Use Authorization (EUA). This EUA will remain  in effect (meaning this test can be used) for the duration of the  Covid-19 declaration under Section 564(b)(1) of the Act, 21  U.S.C. section 360bbb-3(b)(1), unless the authorization is  terminated or revoked. Performed at [facility], [address] [place], [place] [address]      Discharge Instructions:   Discharge Instructions    Diet full liquid   Complete by: As directed    Continue bolus tube feedings via PEG tube   Discharge instructions   Complete by: As directed    Follow up wit urologist, Dr. Bernardo Heater, for foley catheter removal on 04/11/2020   Face-to-face encounter (required for Medicare/Medicaid patients)   Complete by: As directed    I Dinari Stgermaine certify  that this patient is under my care and that I, or a nurse practitioner or physician's assistant working with me, had a face-to-face encounter that meets the physician face-to-face encounter requirements with this patient on 04/08/2020. The encounter with the patient was in whole, or in part for the following medical condition(s) which is the primary reason for home health care (List medical condition): Chronic debility   The encounter with the patient was in whole, or in part, for  the following medical condition, which is the primary reason for home health care: Chronic debility   I certify that, based on my findings, the following services are medically necessary home health services: Physical therapy   Reason for Medically Necessary Home Health Services: Therapy- Personnel officer, Public librarian   My clinical findings support the need for the above services: Unable to leave home safely without assistance and/or assistive device   Further, I certify that my clinical findings support that this patient is homebound due to: Unable to leave home safely without assistance   Home Health   Complete by: As directed    To provide the following care/treatments: PT   Increase activity slowly   Complete by: As directed    Schedule appointment   Complete by: As directed    Follow up with PCP in 1 week     Allergies as of 04/09/2020      Reactions   Aspirin Other (See Comments)   Burns stomach   Penicillins    Did it involve swelling of the face/tongue/throat, SOB, or low BP? Unknown Did it involve sudden or severe rash/hives, skin peeling, or any reaction on the inside of your mouth or nose? Unknown Did you need to seek medical attention at a hospital or doctor's office? Unknown When did it last happen?patient refuses to take due to parents being allergic If all above answers are "NO", may proceed with cephalosporin use. Parents allergic    Vinegar [acetic Acid] Other (See Comments)     seizure      Medication List    TAKE these medications   acetaminophen 500 MG tablet Commonly known as: TYLENOL Place 1,000 mg into feeding tube every 8 (eight) hours as needed for mild pain or fever.   Carboxymethylcellulose Sodium 0.25 % Soln Place 1 drop into both eyes 2 (two) times daily as needed (for dry eyes).   Commode 3-In-1 Misc Use as needed   feeding supplement (OSMOLITE 1.5 CAL) Liqd Place 1,000 mLs into feeding tube daily.   finasteride 5 MG tablet Commonly known as: PROSCAR Take 5 mg by mouth daily.   free water Soln Place 300 mLs into feeding tube every 4 (four) hours.   gabapentin 100 MG capsule Commonly known as: NEURONTIN Place 100 mg into feeding tube 2 (two) times daily.   HYDROcodone-acetaminophen 7.5-325 mg/15 ml solution Commonly known as: HYCET Place 10 mLs into feeding tube every 6 (six) hours as needed (for chronic pain).   polyethylene glycol 17 g packet Commonly known as: MIRALAX / GLYCOLAX Place 17 g into feeding tube daily as needed for mild constipation or moderate constipation.   Salonpas 3.12-12-08 % Ptch Generic drug: Camphor-Menthol-Methyl Sal Apply 1 patch topically daily.   sulfamethoxazole-trimethoprim 800-160 MG tablet Commonly known as: BACTRIM DS Place 1 tablet into feeding tube 2 (two) times daily for 2 days.   terazosin 2 MG capsule Commonly known as: HYTRIN Place 2 mg into feeding tube at bedtime.   thiamine 100 MG tablet Place 100 mg into feeding tube daily.   traZODone 50 MG tablet Commonly known as: DESYREL Place 1 tablet (50 mg total) into feeding tube at bedtime.   Zinc Oxide 12.8 % ointment Commonly known as: TRIPLE PASTE Apply topically 3 (three) times daily. Apply to abdominal, chest and back rash      Follow-up Information    Stoioff, Ronda Fairly, MD. Schedule an appointment as soon as possible for a visit in 2 day(s).  Specialty: Urology Why: Follow up for foley catheter removal Contact  information: [address] [place] [address] [phone]            Time coordinating discharge: 32 minutes  Signed:  [place] [facility] 04/09/2020, 12:30 PM

## 2020-04-09 NOTE — Progress Notes (Addendum)
Hypoglycemic Event  CBG: 54  Treatment: repeat cbg  Symptoms: none  Follow-up CBG: Time: 0501 CBG Result:69 0535: 78 on recheck post 1/2 amp D50  Possible Reasons for Event: unknown  Comments/MD notified:On-call provider, Rufina Falco, NP notified and consulted. Will treat with D50 for now and monitor. Barbaraann Faster, RN 5:16 AM. 04/09/2020   Barbaraann Faster

## 2020-04-09 NOTE — TOC Transition Note (Signed)
Transition of Care [facility]) - CM/SW Discharge Note   Patient Details  Name: Thomas [name] MRN: [id number] Date of Birth: 30-Aug-1971  Transition of Care [facility]) CM/SW Contact:  Beverly Sessions, RN Phone Number: 04/09/2020, 3:14 PM   Clinical Narrative:     Patient to discharge home today Mother at bedside for transport Patient will be going back to his mothers house in [place]  Patient has follow up appointment scheduled with urology in 1 week  Patient to have PT services through [facility].  Corene Cornea with [facility] notified of discharge  [facility] has provided education in the home to both patient and mother related to his tube feeding.  Bedside commode delivered to patient prior to discharge by [facility]  Final next level of care: Home w Home Health Services Barriers to Discharge: No Barriers Identified   Patient Goals and CMS Choice     Choice offered to / list presented to : Patient  Discharge Placement                       Discharge Plan and Services                DME Arranged: 3-N-1 DME Agency: [facility] Date DME Agency Contacted: 04/09/20 Time DME Agency Contacted: [phone]   [place] Agency: [place] ([place]) Date [place]: 04/09/20 Time [place]: [id number] Representative spoke with at [place]: [place] ([place]) Interventions     Readmission Risk Interventions Readmission Risk Prevention Plan 04/05/2020 04/01/2020  Transportation Screening Complete Complete  PCP or Specialist Appt within 3-5 Days - Complete  HRI or [place] - Complete  Social Work Consult for [place] Planning/Counseling - Complete  Palliative Care Screening - Not Applicable  Medication Review Press photographer) Complete Complete  HRI or [place] Not Applicable -  Some recent data might be hidden

## 2020-04-09 NOTE — Consult Note (Addendum)
[place] Nurse Consult Note: Reason for Consult: Consult requested for split fistula; pt is familiar to [facility] team from recent admission, refer to consult note from 2/10.  Pt states he is able to apply a pouch to the affected area prior to admission, but requires assistance while in the hospital since he cannot visualize the location. Right upper chest with a full thickness opening and fistula with large amt thick mucous drainage; 1.5X.3cm. Current pouch is leaking and skin is red and macerated in the shape and size of the ostomy wafer.   Applied new pouch and connected to bedside drainage bag. 3 extra sets of supplies left in the room.   Dressing procedure/placement/frequency: Instructions provided for bedside nurses to change the pouch PRN if leaking as follows: Bedside nurse; please change the neck pouch PRN if leaking as follows:  1. Remove pouch, cleanse skin with warm water and pat dry.  2.  If skin is very red and macerated, apply a layer of ostomy powder (left at bedside), then brush off the excess.   3. Apply skin prep wipes over all dry exposed skin.  If ostomy powder was applied, then dab over the powder areas to add a protective layer. 4.  Use pattern (left in room) to cut an opening in the wafer, then apply a barrier ring stretched to fit around the opening. ( Barrier rings, Kellie Simmering # [phone], ostomy wafer Kellie Simmering # [phone], Urostomy pouch [place] # [id number].  Apply the barrier ring/pouch over the opening and press around edges to seal.  6.  Cut a piece of Tegaderm in half and apply 4 pieces around the pouch edges to help hold in place, like a picture frame. 7.  Make sure the spout is turned on (a drop should be showing) and attach to the bedside drainage bag. [place] team will remain available if further assistance is requested.  Julien Girt MSN, RN, [place], [place], [place]

## 2020-04-10 ENCOUNTER — Other Ambulatory Visit: Payer: Self-pay

## 2020-04-10 ENCOUNTER — Ambulatory Visit: Payer: Medicaid Other | Attending: Internal Medicine

## 2020-04-10 DIAGNOSIS — Z23 Encounter for immunization: Secondary | ICD-10-CM

## 2020-04-10 NOTE — Progress Notes (Signed)
   Covid-19 Vaccination Clinic  Name:  Thomas [name]    MRN: [id number] DOB: Mar 01, 1971  04/10/2020  Mr. Donmoyer was observed post Covid-19 immunization for 15 minutes without incident. He was provided with Vaccine Information Sheet and instruction to access the V-Safe system.   Mr. Norby was instructed to call 911 with any severe reactions post vaccine: Marland Kitchen Difficulty breathing  . Swelling of face and throat  . A fast heartbeat  . A bad rash all over body  . Dizziness and weakness   Immunizations Administered    Name Date Dose VIS Date Route   Pfizer COVID-19 Vaccine 04/10/2020 10:31 AM 0.3 mL 01/31/2019 Intramuscular   Manufacturer: [place]   Lot: [id number]   [place]: [id number]

## 2020-04-16 ENCOUNTER — Other Ambulatory Visit: Payer: Self-pay

## 2020-04-16 ENCOUNTER — Ambulatory Visit (INDEPENDENT_AMBULATORY_CARE_PROVIDER_SITE_OTHER): Payer: Medicaid Other | Admitting: Physician Assistant

## 2020-04-16 ENCOUNTER — Encounter: Payer: Self-pay | Admitting: Physician Assistant

## 2020-04-16 ENCOUNTER — Ambulatory Visit: Payer: Self-pay | Admitting: Physician Assistant

## 2020-04-16 VITALS — BP 96/65 | HR 75 | Ht 66.0 in | Wt 81.7 lb

## 2020-04-16 DIAGNOSIS — R310 Gross hematuria: Secondary | ICD-10-CM

## 2020-04-16 DIAGNOSIS — R109 Unspecified abdominal pain: Secondary | ICD-10-CM | POA: Diagnosis not present

## 2020-04-16 DIAGNOSIS — R339 Retention of urine, unspecified: Secondary | ICD-10-CM

## 2020-04-16 LAB — BLADDER SCAN AMB NON-IMAGING

## 2020-04-16 NOTE — Patient Instructions (Signed)
Make sure you are very well hydrated the morning of your kidney ultrasound.

## 2020-04-16 NOTE — Progress Notes (Signed)
Fill and Pull Catheter Removal  Patient is present today for a catheter removal.  Patient was cleaned and prepped in a sterile fashion 298ml of sterile water was instilled into the bladder when the patient felt the urge to urinate. 76ml of water was then drained from the balloon.  A [id number] silicone foley cath was removed from the bladder no complications were noted .  Patient was then given some time to void on their own.  Patient can void  111ml on their own after some time.  Patient tolerated well.  Performed by: Debroah Loop, PA-C   Follow up/ Additional notes: Push fluids and RTC this afternoon for PVR.
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
## 2020-04-19 LAB — GLUCOSE, CAPILLARY: Glucose-Capillary: 35 mg/dL — CL (ref 70–99)

## 2020-04-21 ENCOUNTER — Emergency Department: Payer: Medicaid Other

## 2020-04-21 ENCOUNTER — Other Ambulatory Visit: Payer: Self-pay

## 2020-04-21 ENCOUNTER — Inpatient Hospital Stay
Admission: EM | Admit: 2020-04-21 | Discharge: 2020-05-07 | DRG: 393 | Disposition: A | Discharge status: Home Health | Payer: Medicaid Other | Attending: Internal Medicine | Admitting: Internal Medicine

## 2020-04-21 ENCOUNTER — Inpatient Hospital Stay: Payer: Medicaid Other

## 2020-04-21 DIAGNOSIS — Z87442 Personal history of urinary calculi: Secondary | ICD-10-CM | POA: Diagnosis not present

## 2020-04-21 DIAGNOSIS — N3000 Acute cystitis without hematuria: Secondary | ICD-10-CM | POA: Diagnosis present

## 2020-04-21 DIAGNOSIS — K219 Gastro-esophageal reflux disease without esophagitis: Secondary | ICD-10-CM | POA: Diagnosis present

## 2020-04-21 DIAGNOSIS — R52 Pain, unspecified: Secondary | ICD-10-CM

## 2020-04-21 DIAGNOSIS — N200 Calculus of kidney: Secondary | ICD-10-CM | POA: Diagnosis present

## 2020-04-21 DIAGNOSIS — E871 Hypo-osmolality and hyponatremia: Secondary | ICD-10-CM | POA: Diagnosis present

## 2020-04-21 DIAGNOSIS — G8929 Other chronic pain: Secondary | ICD-10-CM | POA: Diagnosis present

## 2020-04-21 DIAGNOSIS — Z8249 Family history of ischemic heart disease and other diseases of the circulatory system: Secondary | ICD-10-CM

## 2020-04-21 DIAGNOSIS — A419 Sepsis, unspecified organism: Secondary | ICD-10-CM | POA: Diagnosis not present

## 2020-04-21 DIAGNOSIS — Z811 Family history of alcohol abuse and dependence: Secondary | ICD-10-CM

## 2020-04-21 DIAGNOSIS — E43 Unspecified severe protein-calorie malnutrition: Secondary | ICD-10-CM | POA: Diagnosis present

## 2020-04-21 DIAGNOSIS — I959 Hypotension, unspecified: Secondary | ICD-10-CM | POA: Diagnosis not present

## 2020-04-21 DIAGNOSIS — E162 Hypoglycemia, unspecified: Secondary | ICD-10-CM | POA: Diagnosis present

## 2020-04-21 DIAGNOSIS — Z87820 Personal history of traumatic brain injury: Secondary | ICD-10-CM

## 2020-04-21 DIAGNOSIS — Z20822 Contact with and (suspected) exposure to covid-19: Secondary | ICD-10-CM | POA: Diagnosis present

## 2020-04-21 DIAGNOSIS — Q390 Atresia of esophagus without fistula: Secondary | ICD-10-CM

## 2020-04-21 DIAGNOSIS — E876 Hypokalemia: Secondary | ICD-10-CM | POA: Diagnosis present

## 2020-04-21 DIAGNOSIS — E870 Hyperosmolality and hypernatremia: Secondary | ICD-10-CM | POA: Diagnosis present

## 2020-04-21 DIAGNOSIS — Z931 Gastrostomy status: Secondary | ICD-10-CM | POA: Diagnosis not present

## 2020-04-21 DIAGNOSIS — K9423 Gastrostomy malfunction: Secondary | ICD-10-CM | POA: Diagnosis present

## 2020-04-21 DIAGNOSIS — I9589 Other hypotension: Secondary | ICD-10-CM | POA: Diagnosis present

## 2020-04-21 DIAGNOSIS — D696 Thrombocytopenia, unspecified: Secondary | ICD-10-CM | POA: Diagnosis present

## 2020-04-21 DIAGNOSIS — Z87891 Personal history of nicotine dependence: Secondary | ICD-10-CM

## 2020-04-21 DIAGNOSIS — R339 Retention of urine, unspecified: Secondary | ICD-10-CM | POA: Diagnosis present

## 2020-04-21 DIAGNOSIS — J209 Acute bronchitis, unspecified: Secondary | ICD-10-CM | POA: Diagnosis present

## 2020-04-21 DIAGNOSIS — N179 Acute kidney failure, unspecified: Secondary | ICD-10-CM | POA: Diagnosis present

## 2020-04-21 DIAGNOSIS — R531 Weakness: Secondary | ICD-10-CM

## 2020-04-21 DIAGNOSIS — Z79891 Long term (current) use of opiate analgesic: Secondary | ICD-10-CM

## 2020-04-21 DIAGNOSIS — R6520 Severe sepsis without septic shock: Secondary | ICD-10-CM | POA: Diagnosis present

## 2020-04-21 DIAGNOSIS — H5789 Other specified disorders of eye and adnexa: Secondary | ICD-10-CM | POA: Diagnosis present

## 2020-04-21 DIAGNOSIS — Z681 Body mass index (BMI) 19 or less, adult: Secondary | ICD-10-CM

## 2020-04-21 DIAGNOSIS — R109 Unspecified abdominal pain: Secondary | ICD-10-CM | POA: Diagnosis present

## 2020-04-21 DIAGNOSIS — E860 Dehydration: Secondary | ICD-10-CM | POA: Diagnosis present

## 2020-04-21 DIAGNOSIS — Z9889 Other specified postprocedural states: Secondary | ICD-10-CM | POA: Insufficient documentation

## 2020-04-21 DIAGNOSIS — Z934 Other artificial openings of gastrointestinal tract status: Secondary | ICD-10-CM

## 2020-04-21 DIAGNOSIS — R338 Other retention of urine: Secondary | ICD-10-CM | POA: Diagnosis not present

## 2020-04-21 DIAGNOSIS — Z95828 Presence of other vascular implants and grafts: Secondary | ICD-10-CM

## 2020-04-21 LAB — CBC WITH DIFFERENTIAL/PLATELET
Abs Immature Granulocytes: 0.05 10*3/uL (ref 0.00–0.07)
Basophils Absolute: 0.1 10*3/uL (ref 0.0–0.1)
Basophils Relative: 1 %
Eosinophils Absolute: 0 10*3/uL (ref 0.0–0.5)
Eosinophils Relative: 0 %
HCT: 46.1 % (ref 39.0–52.0)
Hemoglobin: 14.1 g/dL (ref 13.0–17.0)
Immature Granulocytes: 1 %
Lymphocytes Relative: 24 %
Lymphs Abs: 2 10*3/uL (ref 0.7–4.0)
MCH: 29 pg (ref 26.0–34.0)
MCHC: 30.6 g/dL (ref 30.0–36.0)
MCV: 94.7 fL (ref 80.0–100.0)
Monocytes Absolute: 0.4 10*3/uL (ref 0.1–1.0)
Monocytes Relative: 5 %
Neutro Abs: 5.9 10*3/uL (ref 1.7–7.7)
Neutrophils Relative %: 69 %
Platelets: 353 10*3/uL (ref 150–400)
RBC: 4.87 MIL/uL (ref 4.22–5.81)
RDW: 17.1 % — ABNORMAL HIGH (ref 11.5–15.5)
WBC: 8.5 10*3/uL (ref 4.0–10.5)
nRBC: 0 % (ref 0.0–0.2)

## 2020-04-21 LAB — URINALYSIS, COMPLETE (UACMP) WITH MICROSCOPIC
Bilirubin Urine: NEGATIVE
Glucose, UA: NEGATIVE mg/dL
Ketones, ur: 5 mg/dL — AB
Leukocytes,Ua: NEGATIVE
Nitrite: NEGATIVE
Protein, ur: 30 mg/dL — AB
RBC / HPF: >50 RBC/hpf — ABNORMAL HIGH (ref 0–5)
Specific Gravity, Urine: 1.023 (ref 1.005–1.030)
Squamous Epithelial / HPF: NONE SEEN (ref 0–5)
pH: 5 (ref 5.0–8.0)

## 2020-04-21 LAB — COMPREHENSIVE METABOLIC PANEL
ALT: 11 U/L (ref 0–44)
AST: 26 U/L (ref 15–41)
Albumin: 4.7 g/dL (ref 3.5–5.0)
Alkaline Phosphatase: 59 U/L (ref 38–126)
Anion gap: 19 — ABNORMAL HIGH (ref 5–15)
BUN: 68 mg/dL — ABNORMAL HIGH (ref 6–20)
CO2: 26 mmol/L (ref 22–32)
Calcium: 10.6 mg/dL — ABNORMAL HIGH (ref 8.9–10.3)
Chloride: 105 mmol/L (ref 98–111)
Creatinine, Ser: 1.69 mg/dL — ABNORMAL HIGH (ref 0.61–1.24)
GFR calc Af Amer: 54 mL/min — ABNORMAL LOW (ref >60)
GFR calc non Af Amer: 47 mL/min — ABNORMAL LOW (ref >60)
Glucose, Bld: 76 mg/dL (ref 70–99)
Potassium: 5 mmol/L (ref 3.5–5.1)
Sodium: 150 mmol/L — ABNORMAL HIGH (ref 135–145)
Total Bilirubin: 1.3 mg/dL — ABNORMAL HIGH (ref 0.3–1.2)
Total Protein: 7.6 g/dL (ref 6.5–8.1)

## 2020-04-21 LAB — SARS CORONAVIRUS 2 BY RT PCR (HOSPITAL ORDER, PERFORMED IN ~~LOC~~ HOSPITAL LAB): SARS Coronavirus 2: NEGATIVE

## 2020-04-21 LAB — LACTIC ACID, PLASMA: Lactic Acid, Venous: 1.9 mmol/L (ref 0.5–1.9)

## 2020-04-21 MED ORDER — HYDROCODONE-ACETAMINOPHEN 5-325 MG PO TABS: 1.0000 | ORAL_TABLET | ORAL | Status: DC | PRN

## 2020-04-21 MED ORDER — SODIUM CHLORIDE 0.9 % IV BOLUS
2000.0000 mL | Freq: Once | INTRAVENOUS | Status: AC
  Administered 2020-04-21: 2000 mL via INTRAVENOUS

## 2020-04-21 MED ORDER — ENOXAPARIN SODIUM 40 MG/0.4ML ~~LOC~~ SOLN: 40.0000 mg | SUBCUTANEOUS | Status: DC

## 2020-04-21 MED ORDER — ONDANSETRON HCL 4 MG PO TABS
4.0000 mg | ORAL_TABLET | Freq: Four times a day (QID) | ORAL | Status: DC | PRN
  Administered 2020-05-05: 4 mg via ORAL
  Filled 2020-04-21: qty 1

## 2020-04-21 MED ORDER — MORPHINE SULFATE (PF) 2 MG/ML IV SOLN
2.0000 mg | INTRAVENOUS | Status: DC | PRN
  Administered 2020-04-21 – 2020-04-23 (×8): 2 mg via INTRAVENOUS
  Filled 2020-04-21 (×8): qty 1

## 2020-04-21 MED ORDER — SODIUM CHLORIDE 0.9 % IV SOLN: INTRAVENOUS | Status: DC

## 2020-04-21 MED ORDER — ONDANSETRON HCL 4 MG/2ML IJ SOLN: 4.0000 mg | Freq: Four times a day (QID) | INTRAMUSCULAR | Status: DC | PRN

## 2020-04-21 MED ORDER — POLYETHYLENE GLYCOL 3350 17 G PO PACK: 17.0000 g | PACK | Freq: Every day | ORAL | Status: DC | PRN

## 2020-04-21 MED ORDER — ACETAMINOPHEN 325 MG PO TABS
650.0000 mg | ORAL_TABLET | Freq: Four times a day (QID) | ORAL | Status: DC | PRN
  Administered 2020-04-25: 650 mg via ORAL
  Filled 2020-04-21 (×2): qty 2

## 2020-04-21 MED ORDER — SODIUM CHLORIDE 0.9 % IV BOLUS (SEPSIS)
500.0000 mL | Freq: Once | INTRAVENOUS | Status: AC
  Administered 2020-04-21: 500 mL via INTRAVENOUS

## 2020-04-21 MED ORDER — OSMOLITE 1.2 CAL PO LIQD
1000.0000 mL | ORAL | Status: DC
  Administered 2020-04-22: 1000 mL

## 2020-04-21 MED ORDER — TRAZODONE HCL 50 MG PO TABS
50.0000 mg | ORAL_TABLET | Freq: Every day | ORAL | Status: DC
  Administered 2020-04-22 – 2020-05-06 (×10): 50 mg
  Filled 2020-04-21 (×12): qty 1

## 2020-04-21 MED ORDER — MORPHINE SULFATE (PF) 4 MG/ML IV SOLN: 4.0000 mg | Freq: Once | INTRAVENOUS | Status: DC

## 2020-04-21 MED ORDER — SODIUM CHLORIDE 0.9 % IV SOLN
2.0000 g | INTRAVENOUS | Status: DC
  Administered 2020-04-22 – 2020-04-23 (×2): 2 g via INTRAVENOUS
  Filled 2020-04-21 (×2): qty 2

## 2020-04-21 MED ORDER — FINASTERIDE 5 MG PO TABS
5.0000 mg | ORAL_TABLET | Freq: Every day | ORAL | Status: DC
  Administered 2020-04-22 – 2020-05-07 (×9): 5 mg via ORAL
  Filled 2020-04-21 (×13): qty 1

## 2020-04-21 MED ORDER — ACETAMINOPHEN 650 MG RE SUPP: 650.0000 mg | Freq: Four times a day (QID) | RECTAL | Status: DC | PRN

## 2020-04-21 MED ORDER — SODIUM CHLORIDE 0.9 % IV SOLN
1.0000 g | Freq: Once | INTRAVENOUS | Status: AC
  Administered 2020-04-21: 1 g via INTRAVENOUS
  Filled 2020-04-21: qty 1

## 2020-04-21 MED ORDER — THIAMINE HCL 100 MG PO TABS
100.0000 mg | ORAL_TABLET | Freq: Every day | ORAL | Status: DC
  Administered 2020-04-22 – 2020-05-07 (×9): 100 mg
  Filled 2020-04-21 (×11): qty 1

## 2020-04-21 MED ORDER — ENOXAPARIN SODIUM 40 MG/0.4ML ~~LOC~~ SOLN
30.0000 mg | SUBCUTANEOUS | Status: DC
  Filled 2020-04-21 (×3): qty 0.4

## 2020-04-21 MED ORDER — FREE WATER
300.0000 mL | Status: DC
  Administered 2020-04-22 (×3): 300 mL
  Filled 2020-04-21 (×5): qty 300

## 2020-04-21 MED ORDER — SODIUM CHLORIDE 0.9 % IV BOLUS: 1000.0000 mL | Freq: Once | INTRAVENOUS | Status: DC

## 2020-04-21 MED ORDER — FENTANYL CITRATE (PF) 100 MCG/2ML IJ SOLN
25.0000 ug | Freq: Once | INTRAMUSCULAR | Status: AC
  Administered 2020-04-21: 25 ug via INTRAVENOUS
  Filled 2020-04-21: qty 2

## 2020-04-21 NOTE — ED Notes (Signed)
X RAY at bedside 

## 2020-04-21 NOTE — ED Notes (Signed)
First Nurse Note: Pt to ED via [facility] from home for right rib pain. VSS per EMS.  Upon arriving to lobby pt asking about wait time. Pt informed that we do not give out wait times.

## 2020-04-21 NOTE — ED Notes (Signed)
Pt stuck twice and unable to obtain IV access or blood.  Working on getting pt room at this time and will let RN know asap.  Pt taken to Xray now

## 2020-04-21 NOTE — ED Notes (Signed)
Awaiting arrival of tubing for tube feeds. Osmolite and pump are here. PT removed bag from ostomy site on chest. Supply chain is obtaining correct bag for replacement

## 2020-04-21 NOTE — ED Notes (Signed)
RN informed of pt to room EDT Thomas [name] at bedside to perform EKg and place pt on monitor.

## 2020-04-21 NOTE — Consult Note (Signed)
Pharmacy Antibiotic Note  Thomas [name] is a 49 y.o. male admitted on 04/21/2020 with UTI. Patient currently in AKI(SCR 1.69) with baseline SCR~0.70.  Pharmacy has been consulted for Cefepime dosing. Patient with noted PCN allergy, but tolerated initial Cefepime dose in ED, so therefore will continue with cephalosporin use.  Plan: Cefepime 2g IV Q24 hours  Will adjust dosing as renal function improves.  Height: 5\' 6"  (167.6 cm) Weight: 37.1 kg (81 lb 11.2 oz) IBW/kg (Calculated) : 63.8  Temp (24hrs), Avg:97.6 F (36.4 C), Min:97.6 F (36.4 C), Max:97.6 F (36.4 C)  Recent Labs  Lab 04/21/20 1616 04/21/20 1701  WBC 8.5  --   CREATININE 1.69*  --   LATICACIDVEN  --  1.9    Estimated Creatinine Clearance: 27.7 mL/min (A) (by C-G formula based on SCr of 1.69 mg/dL (H)).    Allergies  Allergen Reactions  . Aspirin Other (See Comments)    Burns stomach   . Penicillins     Did it involve swelling of the face/tongue/throat, SOB, or low BP? Unknown Did it involve sudden or severe rash/hives, skin peeling, or any reaction on the inside of your mouth or nose? Unknown Did you need to seek medical attention at a hospital or doctor's office? Unknown When did it last happen?patient refuses to take due to parents being allergic If all above answers are "NO", may proceed with cephalosporin use. Parents allergic   . Vinegar [Acetic Acid] Other (See Comments)    seizure    Antimicrobials this admission: Cefepime 5/16 >>   Microbiology results: 5/16 BCx: pending 5/16 UCx: pending   Thank you for allowing pharmacy to be a part of this patient's care.  Walid A Nazari 04/21/2020 9:00 PM

## 2020-04-21 NOTE — ED Notes (Signed)
Patient repeatedly hits call bell asking for Ice chips, when patient will get a room, if patient can eat, and to turn the lights on and or off. RN and co-workers have been in room numerous times. Patient has been informed that this is an Emergency Department and the call bell will be answered ASAP based on need and level of emergence.

## 2020-04-21 NOTE — ED Notes (Signed)
Dr. Damita Dunnings states to place pt NPO due to increased secretions at esophagostomy site and rash noted. Will leave undressed to allow site to dry before placing ostomy back on site. Awaiting Xray result to administer meds and tube feedings.
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
## 2020-04-21 NOTE — ED Notes (Signed)
Pt reports to this nurse that his room is too cold and that he will need a new room. Pt educated on room placement and plan in ED. This nurse attempted to adjust thermostat for pt. PT states he does not need warm blankets at this time

## 2020-04-21 NOTE — ED Notes (Signed)
Pt requests towel and wash cloth, provided.
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
## 2020-04-21 NOTE — ED Notes (Signed)
RN has called and requested Phlebotomy to come and attempt Lab draw for Blood Cultures and Lactic Acid.  Phlebotomy Tech is in route to patient room.

## 2020-04-21 NOTE — ED Notes (Signed)
New bag applied to ostomy site on chest. Hooked to bag for drainage.

## 2020-04-21 NOTE — Progress Notes (Signed)
Pharmacy Lovenox Dosing  49 y.o. male admitted with Flank Pain . Patient ordered Lovenox 40 mg daily for VTE prophylaxis.   Filed Weights   04/21/20 1532  Weight: 37.1 kg (81 lb 11.2 oz)    Body mass index is 13.19 kg/m.  Estimated Creatinine Clearance: 27.7 mL/min (A) (by C-G formula based on SCr of 1.69 mg/dL (H)).  Will adjust Lovenox dosing to 30 mg Q24 Hours.   Thomas [name] A Thomas [name] 04/21/2020 8:20 PM

## 2020-04-21 NOTE — ED Notes (Signed)
Pt reports pain last several days with tube feedings. ED MD notified and KUB ordered, pt agrees to imaging. Plan of care shared with PT

## 2020-04-21 NOTE — ED Notes (Signed)
Pt hits call light for 3rd time in last 12 minutes. Pt states his cathter is leaking. This nurse sees no signs of leaks. Pt is stating that when he attempts to urinate he has leaking around end of penis. None seen by this nurse and bed is dry. Bag has again been removed from ostomy site on chest. Will attempt to secure again. Site stays wet and despite being dried prior to last placement device did not remain stuck, was saturated in spit.

## 2020-04-21 NOTE — ED Triage Notes (Signed)
Pt comes via [facility] from home with c/o right sided flank pain. Pt states this started last night. Pt states pain when he urinates.  Pt states pain when he is giving himself food through the G-tube.  Pt states SOB and pain when breathing.  Pt has stoma present.

## 2020-04-22 ENCOUNTER — Inpatient Hospital Stay: Payer: Medicaid Other

## 2020-04-22 DIAGNOSIS — N179 Acute kidney failure, unspecified: Secondary | ICD-10-CM

## 2020-04-22 DIAGNOSIS — K9423 Gastrostomy malfunction: Principal | ICD-10-CM

## 2020-04-22 DIAGNOSIS — E43 Unspecified severe protein-calorie malnutrition: Secondary | ICD-10-CM

## 2020-04-22 DIAGNOSIS — I959 Hypotension, unspecified: Secondary | ICD-10-CM

## 2020-04-22 DIAGNOSIS — R338 Other retention of urine: Secondary | ICD-10-CM

## 2020-04-22 DIAGNOSIS — R531 Weakness: Secondary | ICD-10-CM

## 2020-04-22 LAB — COMPREHENSIVE METABOLIC PANEL
ALT: 13 U/L (ref 0–44)
AST: 17 U/L (ref 15–41)
Albumin: 4.4 g/dL (ref 3.5–5.0)
Alkaline Phosphatase: 54 U/L (ref 38–126)
Anion gap: 9 (ref 5–15)
BUN: 46 mg/dL — ABNORMAL HIGH (ref 6–20)
CO2: 28 mmol/L (ref 22–32)
Calcium: 10 mg/dL (ref 8.9–10.3)
Chloride: 116 mmol/L — ABNORMAL HIGH (ref 98–111)
Creatinine, Ser: 0.94 mg/dL (ref 0.61–1.24)
GFR calc Af Amer: >60 mL/min (ref >60)
GFR calc non Af Amer: >60 mL/min (ref >60)
Glucose, Bld: 102 mg/dL — ABNORMAL HIGH (ref 70–99)
Potassium: 4.2 mmol/L (ref 3.5–5.1)
Sodium: 153 mmol/L — ABNORMAL HIGH (ref 135–145)
Total Bilirubin: 0.9 mg/dL (ref 0.3–1.2)
Total Protein: 7 g/dL (ref 6.5–8.1)

## 2020-04-22 LAB — URINE CULTURE: Culture: NO GROWTH

## 2020-04-22 LAB — CBC
HCT: 45.7 % (ref 39.0–52.0)
Hemoglobin: 13.8 g/dL (ref 13.0–17.0)
MCH: 28.6 pg (ref 26.0–34.0)
MCHC: 30.2 g/dL (ref 30.0–36.0)
MCV: 94.8 fL (ref 80.0–100.0)
Platelets: 140 10*3/uL — ABNORMAL LOW (ref 150–400)
RBC: 4.82 MIL/uL (ref 4.22–5.81)
RDW: 17.2 % — ABNORMAL HIGH (ref 11.5–15.5)
WBC: 6.8 10*3/uL (ref 4.0–10.5)
nRBC: 0 % (ref 0.0–0.2)

## 2020-04-22 LAB — PROTIME-INR
INR: 1 (ref 0.8–1.2)
Prothrombin Time: 12.4 seconds (ref 11.4–15.2)

## 2020-04-22 LAB — GLUCOSE, CAPILLARY
Glucose-Capillary: 48 mg/dL — ABNORMAL LOW (ref 70–99)
Glucose-Capillary: 155 mg/dL — ABNORMAL HIGH (ref 70–99)
Glucose-Capillary: 64 mg/dL — ABNORMAL LOW (ref 70–99)
Glucose-Capillary: 108 mg/dL — ABNORMAL HIGH (ref 70–99)
Glucose-Capillary: 101 mg/dL — ABNORMAL HIGH (ref 70–99)
Glucose-Capillary: 68 mg/dL — ABNORMAL LOW (ref 70–99)

## 2020-04-22 LAB — PROCALCITONIN: Procalcitonin: <0.1 ng/mL

## 2020-04-22 LAB — CORTISOL-AM, BLOOD: Cortisol - AM: 18.3 ug/dL (ref 6.7–22.6)

## 2020-04-22 MED ORDER — OSMOLITE 1.5 CAL PO LIQD
1000.0000 mL | ORAL | Status: DC
  Administered 2020-04-22 – 2020-04-27 (×4): 1000 mL

## 2020-04-22 MED ORDER — CHLORHEXIDINE GLUCONATE CLOTH 2 % EX PADS
6.0000 | MEDICATED_PAD | Freq: Every day | CUTANEOUS | Status: DC
  Administered 2020-04-22 – 2020-05-07 (×14): 6 via TOPICAL

## 2020-04-22 MED ORDER — DEXTROSE-NACL 5-0.45 % IV SOLN: INTRAVENOUS | Status: DC

## 2020-04-22 MED ORDER — DEXTROSE 50 % IV SOLN
25.0000 g | Freq: Once | INTRAVENOUS | Status: AC
  Administered 2020-04-22: 25 g via INTRAVENOUS
  Filled 2020-04-22: qty 50

## 2020-04-22 MED ORDER — IOHEXOL 300 MG/ML  SOLN
100.0000 mL | Freq: Once | INTRAMUSCULAR | Status: AC | PRN
  Administered 2020-04-22: 100 mL

## 2020-04-22 MED ORDER — NAPHAZOLINE-GLYCERIN 0.012-0.2 % OP SOLN
1.0000 [drp] | Freq: Four times a day (QID) | OPHTHALMIC | Status: DC | PRN
  Administered 2020-04-23: 2 [drp] via OPHTHALMIC
  Filled 2020-04-22: qty 15

## 2020-04-22 MED ORDER — DEXTROSE 50 % IV SOLN
12.5000 g | Freq: Once | INTRAVENOUS | Status: AC
  Administered 2020-04-22: 12.5 g via INTRAVENOUS
  Filled 2020-04-22: qty 50

## 2020-04-22 MED ORDER — LIDOCAINE 5 % EX PTCH
1.0000 | MEDICATED_PATCH | CUTANEOUS | Status: DC
  Administered 2020-04-23 – 2020-05-07 (×12): 1 via TRANSDERMAL
  Filled 2020-04-22 (×17): qty 1

## 2020-04-22 MED ORDER — FREE WATER
120.0000 mL | Freq: Two times a day (BID) | Status: DC
  Administered 2020-04-22 – 2020-04-28 (×10): 120 mL

## 2020-04-22 MED ORDER — FREE WATER
300.0000 mL | Freq: Every day | Status: DC
  Administered 2020-04-22: 300 mL

## 2020-04-22 MED ORDER — DEXTROSE-NACL 5-0.9 % IV SOLN: INTRAVENOUS | Status: DC

## 2020-04-22 NOTE — Progress Notes (Signed)
[facility]  Communication Note  1) As per Epic notation, IVC filter has been in place for >10 years 2) Filter leg is in same position on studies from earlier this year / 2019/07/24 xray 3) Unlikely to be safely removed due to its chronicity - trying to remove at this point can dislodge, fracture or embolize pieces of the filter. 4) Do not recommend removal / intervention at this time  Review images with Dr. Ellis [facility] PA-C 04/22/2020 3:59 PM

## 2020-04-22 NOTE — ED Notes (Signed)
Pt provided cup of water for mouth sponge to wet mouth

## 2020-04-22 NOTE — Consult Note (Signed)
[place] Nurse ostomy consult note Stoma type/location: Right upper chest spit  Stomal assessment/size: 2 cm linear opening.  Protect with barrier ring Peristomal assessment: erythema and tenderness.  Leaks frequently Treatment options for stomal/peristomal skin: stoma powder and no sting skin prep with barrier ring  Output clear liquid (saliva) Ostomy pouching: 2pc. 2 3/4" wound pouch with spout and barrier ring  Education provided: bedside drainage was too high and pouch was not draining and had leaked.  Educated to keep bag lower to facilitate draining.  I changed pouch.  Enrolled patient in [place] program: NA Instructions provided for bedside nurses to change the pouch PRN if leaking as follows: Bedside nurse; please change the neck pouch PRN if leaking as follows:  1. Remove pouch, cleanse skin with warm water and pat dry.  2.  If skin is very red and macerated, apply a layer of ostomy powder (left at bedside), then brush off the excess.   3. Apply skin prep wipes over all dry exposed skin.  If ostomy powder was applied, then dab over the powder areas to add a protective layer. 4.  Use pattern (left in room) to cut an opening in the wafer, then apply a barrier ring stretched to fit around the opening. ( Barrier rings, Kellie Simmering # [phone], ostomy wafer Kellie Simmering # [id number], Urostomy pouch Winnie # [id number].  Apply the barrier ring/pouch over the opening and press around edges to seal.  6.  Cut a piece of Tegaderm in half and apply 4 pieces around the pouch edges to help hold in place, like a picture frame. 7.  Make sure the spout is turned on (a drop should be showing) and attach to the bedside drainage bag. Will not follow at this time.  Please re-consult if needed.  Domenic Moras MSN, RN, FNP-BC CWON Wound, Ostomy, Continence Nurse Pager [phone]

## 2020-04-22 NOTE — Progress Notes (Signed)
Hypoglycemic Event  CBG: 64  Treatment: D50 25 mL (12.5 gm)  Symptoms: None  Follow-up CBG: Time:1340 CBG Result:108  Possible Reasons for Event: Inadequate meal intake  Comments/MD notified    Rolley Sims

## 2020-04-22 NOTE — Procedures (Signed)
Interventional Radiology Procedure:   Indications: Leaking gastrostomy tube  Procedure: Injection and manipulating of gastrostomy tube under fluoroscopy.  Findings: 20 Fr gastrostomy tube was draining into stomach but not moving freely in stomach lumen.  Tube may have been in subcutaneous tissues.  Balloon was deflated and tube was advanced into gastric lumen under fluoro.  Balloon was inflated and contrast injection confirmed placement in stomach.  No evidence for leakage after tube manipulation.    Complications: None     EBL: None  Plan: Gastrostomy tube is ready to use.     Agripina Guyette R. Anselm Pancoast, MD  Pager: [phone]

## 2020-04-22 NOTE — ED Notes (Signed)
MD Whitestone notified pt peg tub is leaking feeding and flush. MD at bedside and states that he is putting in a consult to interventional radiology for possible tube replacement at this time. PT esophageal bypass also evaluated and bad removed due to fluids not draining properly. Pt is to strictly have very limited ice chips PO at this time per Dr Leslye Peer. Wound consult put in for assessment and bag replacement over esophageal bypass

## 2020-04-22 NOTE — ED Notes (Signed)
Pt G tube dressing changed. Ostomy bag is replaced at this time. Sites cleaned.

## 2020-04-22 NOTE — ED Notes (Signed)
This nurse called and verified that lab would be coming to collect labs for 5AM labs

## 2020-04-22 NOTE — Progress Notes (Signed)
Patient ID: Thomas [name], male   DOB: Dec 14, 1970, 49 y.o.   MRN: [id number] [facility] PROGRESS NOTE  Thomas [name] [id number] DOB: Jan 17, 1971 DOA: 04/21/2020 PCP: [name], NP  HPI/Subjective: Called to see the patient early this morning that tube feeds were leaking around his G-tube insertion site.  Patient came in with difficulty urinating.  He is asking for liquids because his mouth is very dry.  Patient had some flank pain and inability urinate and a Foley catheter was placed.  Patient states that his blood pressure is always in the [id number] or 0000000 systolic.  Objective: Vitals:   04/22/20 0932 04/22/20 1230  BP: 121/66 100/69  Pulse: 68 65  Resp: 17 18  Temp: 97.9 F (36.6 C) 98 F (36.7 C)  SpO2: 99% 96%    Intake/Output Summary (Last 24 hours) at 04/22/2020 1616 Last data filed at 04/22/2020 0817 Gross per 24 hour  Intake 1100 ml  Output --  Net 1100 ml   Filed Weights   04/21/20 1532  Weight: 37.1 kg    ROS: Review of Systems  Constitutional: Negative for fever.  Eyes: Negative for blurred vision.  Respiratory: Negative for cough and shortness of breath.   Cardiovascular: Negative for chest pain.  Gastrointestinal: Positive for abdominal pain. Negative for nausea and vomiting.  Genitourinary: Negative for dysuria.  Musculoskeletal: Negative for joint pain.  Neurological: Negative for headaches.   Exam: Physical Exam  Constitutional: He is oriented to person, place, and time.  HENT:  Nose: No mucosal edema.  Mouth/Throat: No oropharyngeal exudate or posterior oropharyngeal edema.  Eyes: Conjunctivae and lids are normal.  Neck: Carotid bruit is not present.  Cardiovascular: S1 normal and S2 normal. Exam reveals no gallop.  No murmur heard. Respiratory: No respiratory distress. He has decreased breath sounds in the right lower field and the left lower field. He has no wheezes. He has no rhonchi. He has no rales.  GI: Soft. Bowel sounds are normal.  There is no abdominal tenderness.  Musculoskeletal:     Right ankle: No swelling.     Left ankle: No swelling.  Lymphadenopathy:    He has no cervical adenopathy.  Neurological: He is alert and oriented to person, place, and time. No cranial nerve deficit.  Skin: Skin is warm. No rash noted. Nails show no clubbing.  Irritation near the stoma in his chest probably secondary to the tape of the ostomy that is over it. Tube feeds seen leaking around the G-tube site this morning when I saw him  Psychiatric: He has a normal mood and affect.      Data Reviewed: Basic Metabolic Panel: Recent Labs  Lab 04/21/20 1616 04/22/20 1055  NA 150* 153*  K 5.0 4.2  CL 105 116*  CO2 26 28  GLUCOSE 76 102*  BUN 68* 46*  CREATININE 1.69* 0.94  CALCIUM 10.6* 10.0   Liver Function Tests: Recent Labs  Lab 04/21/20 1616 04/22/20 1055  AST 26 17  ALT 11 13  ALKPHOS 59 54  BILITOT 1.3* 0.9  PROT 7.6 7.0  ALBUMIN 4.7 4.4   CBC: Recent Labs  Lab 04/21/20 1616 04/22/20 1055  WBC 8.5 6.8  NEUTROABS 5.9  --   HGB 14.1 13.8  HCT 46.1 45.7  MCV 94.7 94.8  PLT 353 140*    CBG: Recent Labs  Lab 04/22/20 0325 04/22/20 0429 04/22/20 1233 04/22/20 1343  GLUCAP 48* 155* 64* 108*    Recent Results (from the past 240  hour(s))  Blood culture (routine x 2)     Status: None (Preliminary result)   Collection Time: 04/21/20  5:02 PM   Specimen: BLOOD  Result Value Ref Range Status   Specimen Description BLOOD BLOOD RIGHT HAND  Final   Special Requests   Final    BOTTLES DRAWN AEROBIC AND ANAEROBIC Blood Culture results may not be optimal due to an inadequate volume of blood received in culture bottles   Culture   Final    NO GROWTH < 24 HOURS Performed at [facility], [address], [place], [place] [address]    Report Status PENDING  Incomplete  SARS Coronavirus 2 by RT PCR (hospital order, performed in [place] hospital lab) Nasopharyngeal Nasopharyngeal Swab      Status: None   Collection Time: 04/21/20  7:22 PM   Specimen: Nasopharyngeal Swab  Result Value Ref Range Status   SARS Coronavirus 2 NEGATIVE NEGATIVE Final    Comment: (NOTE) SARS-CoV-2 target nucleic acids are NOT DETECTED. The SARS-CoV-2 RNA is generally detectable in upper and lower respiratory specimens during the acute phase of infection. The lowest concentration of SARS-CoV-2 viral copies this assay can detect is 250 copies / mL. A negative result does not preclude SARS-CoV-2 infection and should not be used as the sole basis for treatment or other patient management decisions.  A negative result may occur with improper specimen collection / handling, submission of specimen other than nasopharyngeal swab, presence of viral mutation(s) within the areas targeted by this assay, and inadequate number of viral copies (<250 copies / mL). A negative result must be combined with clinical observations, patient history, and epidemiological information. Fact Sheet for Patients:   StrictlyIdeas.no Fact Sheet for Healthcare Providers: BankingDealers.co.za This test is not yet approved or cleared  by the [place] FDA and has been authorized for detection and/or diagnosis of SARS-CoV-2 by FDA under an Emergency Use Authorization (EUA).  This EUA will remain in effect (meaning this test can be used) for the duration of the COVID-19 declaration under Section 564(b)(1) of the Act, 21 U.S.C. section 360bbb-3(b)(1), unless the authorization is terminated or revoked sooner. Performed at [facility], [address] [place], [place] [address]      Studies: DG Chest 1 View  Result Date: 04/21/2020 CLINICAL DATA:  Shortness of breath. EXAM: CHEST  1 VIEW COMPARISON:  Most recent radiograph 03/30/2020. Most recent CT 02/05/2020 FINDINGS: Chronic volume loss in the right hemithorax. Postsurgical change at the right lung base with  surgical clips, chain sutures, and lower median sternotomy wires. Stable right basilar scarring with curvilinear calcifications tracking into the right upper quadrant, unchanged from prior exam. Normal heart size with unchanged mediastinal contours. No acute or focal airspace disease. Mild chronic hyperinflation. No pulmonary edema, pneumothorax, or large pleural effusion. IVC filter is in place. No acute osseous abnormalities are seen. IMPRESSION: 1. Stable radiographic appearance of the chest. No acute abnormality. 2. Scarring and postsurgical change in the right hemithorax. Electronically Signed   By: Keith Rake M.D.   On: 04/21/2020 16:11   DG Abd Portable 1 View  Result Date: 04/21/2020 CLINICAL DATA:  G-tube placement EXAM: PORTABLE ABDOMEN - 1 VIEW COMPARISON:  04/08/2020, CT 03/29/2019 FINDINGS: Gastrostomy balloon projects over the body of the stomach. There is opacification of the gastric fundus and proximal duodenum. Amorphous contrast about the balloon. Scattered peritoneal calcifications as before. Nonobstructed gas pattern. IMPRESSION: 1. Gastrostomy balloon projects over the body of the stomach 2.  Amorphous contrast about the balloon, potentially representing contrast leakage about the gastrostomy tract Electronically Signed   By: Donavan Foil M.D.   On: 04/21/2020 21:46   DG Replc Duoden/Jejuno Tube Percut W/Fluoro  Result Date: 04/22/2020 INDICATION: 49 year old with leaking gastrostomy tube. EXAM: GASTROSTOMY TUBE INJECTION AND MANIPULATION MEDICATIONS: None ANESTHESIA/SEDATION: None CONTRAST:  40 mL Omnipaque 300-administered into the gastric lumen. FLUOROSCOPY TIME:  Fluoroscopy Time: 1 minutes, 0000000 mGy COMPLICATIONS: None immediate. PROCEDURE: Patient was placed supine on the interventional table. Scout image was obtained of the abdomen. The gastrostomy tube was injected with contrast under fluoroscopy. The balloon was deflated and partially pulled back. The tube was advanced  back into the stomach under fluoroscopy and confirmed placement in the gastric lumen. The balloon was inflated again. Additional contrast was injected to confirm placement in the gastric lumen. FINDINGS: Contrast was filling the stomach but the gastrostomy tube was not clearly within the gastric lumen but probably in the subcutaneous tissues. After the balloon was deflated, the gastric tube was successfully advanced back into the gastric lumen. The balloon was moving freely within the gastric lumen at the end of the procedure. Contrast injection confirmed placement in the stomach. Again noted is an abnormal IVC filter. One of the filter legs may be broken and slightly cephalad to the filter hook. Abnormal shape of the IVC filter. IMPRESSION: 1. Successful repositioning of the gastrostomy tube within the stomach lumen. Gastrostomy tube is now well positioned in the stomach and ready for use. 2. Abnormal appearance of the IVC filter with evidence for a broken or malpositioned filter leg. Electronically Signed   By: Markus Daft M.D.   On: 04/22/2020 11:36    Scheduled Meds: . Chlorhexidine Gluconate Cloth  6 each Topical Daily  . enoxaparin (LOVENOX) injection  30 mg Subcutaneous Q24H  . feeding supplement (OSMOLITE 1.5 CAL)  1,000 mL Per Tube Q24H  . finasteride  5 mg Oral Daily  . free water  120 mL Per Tube BID  . free water  300 mL Per Tube Daily  . thiamine  100 mg Per Tube Daily  . traZODone  50 mg Per Tube QHS   Continuous Infusions: . ceFEPime (MAXIPIME) IV 2 g (04/22/20 1114)  . dextrose 5 % and 0.9% NaCl 100 mL/hr at 04/22/20 1224    Assessment/Plan:  1. Sepsis with acute kidney injury secondary to acute cystitis.  Follow-up urine culture.  On Maxipime.  Had a recent catheter which was taken out and now had a new catheter placed in for urinary retention.  Patient always has hypotension. 2. Acute kidney injury improved with IV fluids. 3. Chronic hypotension.  Patient's blood pressure is  normally low. 4. G-tube malfunction with tube feeds coming out of his ostomy site.  Spoke with interventional radiology and they changed his G-tube today and okay to use this evening. 5. Abnormal imaging of IVC filter.  Appreciate vascular opinion.  Because the chronicity of the filter placed removing it may cause more issues than just leaving it in. 6. Acute urinary retention on Proscar.  Foley catheter.  Urology follow-up as outpatient. 7. Weakness.  Physical therapy evaluate 8. Start liquid diet since no further procedures.  This comes out of the ostomy in his chest.  Wound care consultation for ostomy on the chest. 9. Severe protein calorie malnutrition start tube feedings this evening again     Code Status:     Code Status Orders  (From admission, onward)  Start     Ordered   04/21/20 1920  Full code  Continuous     04/21/20 1926        Code Status History    Date Active Date Inactive Code Status Order ID Comments User Context   04/04/2020 1915 04/09/2020 1956 Full Code [id number]  Lenore Cordia, MD ED   03/31/2020 0149 04/02/2020 2342 Full Code [id number]  Neena Rhymes, MD ED   03/12/2020 1612 03/16/2020 1842 Full Code [id number]  Karmen Bongo, MD ED   01/27/2020 2253 02/01/2020 2132 Full Code [id number]  Bethena Roys, MD Inpatient   01/13/2020 1447 01/19/2020 0253 Full Code [id number]  Norval Morton, MD ED   01/13/2020 1237 01/13/2020 1447 Full Code [id number]  Jacqlyn Larsen, PA-C ED   12/19/2019 0052 12/22/2019 1823 Full Code [id number]  Bethena Roys, MD Inpatient   Advance Care Planning Activity     Disposition Plan: Status is: Inpatient  Dispo: The patient is from: Home              Anticipated d/c is to: Home              Anticipated d/c date is: 04/24/2020              Patient currently receiving IV antibiotics for clinical sepsis.  Patient had G-tube replacement today and will need to see if he tolerates feeds.  I would like to keep him in the  hospital until urine culture is fully resulted since the patient did have a recent Foley catheter and now a new Foley catheter placed.  Consultants:  Interventional radiology  Vascular surgery  Antibiotics:  Cefepime  Time spent: 28 minutes  [place]

## 2020-04-22 NOTE — ED Notes (Signed)
Pt asleep at this time

## 2020-04-22 NOTE — Progress Notes (Signed)
Initial Nutrition Assessment  DOCUMENTATION CODES:   Severe malnutrition in context of chronic illness  INTERVENTION:   Resume nocturnal feeds of Osmolite 1.5 Cal at 100 mL/hr x 12 hrs (2000-0800). Provides 1800 kcal, 75 grams of protein, 912 mL H2O daily  Flush tube with 120 mL of free water before starting and after completion of nocturnal tube feeds. Flush with an additional 300 mL of free water once time during the day. Provides a total of 1452 mL H2O daily including water in tube feeding.  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  GOAL:   Patient will meet greater than or equal to 90% of their needs  MONITOR:   Labs, Weight trends, Skin, I & O's, TF tolerance  REASON FOR ASSESSMENT:   Consult Enteral/tube feeding initiation and management  ASSESSMENT:   49 y/o male with h/o congenital esophageal atresia s/p colonic interposition 1976 [facility], s/p gastrocolic & enterocutaneous fistula repair secondary to [facility] 01/08/10 [facility] (injuries included grade V liver laceration, duodenal rupture s/p resection of 8cm small bowel, subdural hemorrhage with traumatic brain injury and right pneumothorax), admitted 06/11/19 with perforation of esophageal graft with communication to the right pleural space and subsequent empyema s/p split fistula formation, TPN from 7/9-7/24, G-J tube placement, thoracotomy/decortication 06/13/19, s/p laparotomy, lower hemi-sternotomy, partial esophagectomy 06/28/19, s/p revision of spit fistula via right anterior thoracotomy on 07/05/2019, recurrent admissions for hypernatremia, recent admit for Klebsiella UTI and urinary retention requiring Foley which was removed by urology on 04/16/2020 who is now admitted with urinary retention, flank pain and  G-tube malfunction   Pt s/p IR adjustment of 30F G-tube today.  Spoke with pt today. Pt reports that he has not been doing his tube feeds at home regularly related to pump malfunction and pain while administering his tube feeds. Pt reports that he prefers nocturnal feeds overnight but that he often has trouble getting his pump to work correctly; pt has also reported pump issues with different pumps in the past. Pt reports that he was trying to give himself bolus feeds after his pump would not work but that he was having a lot of pain when pushing the food in. Pt was seen by IR this morning who reports that the tube may have been in the SQ tissues; tube was readjusted and is now ready for use. Pt would like to resume nocturnal feeds in hospital. Pt is asking to have a clear liquid tray so that he can eat for pleasure. MD reports that he is waiting on evaluation of his IVC filter prior to starting a diet. RD will order nocturnal feeds and adjust free water. Pt has been non-complaint with tube feeds in the past.   Per chart pt has lost 25.4 kg (42% body weight) over the past 9 months, which is significant for time frame.  Medications reviewed and include: lovenox, thiamine, cefepime, NaCl w/ dextrose @100ml /hr  Labs reviewed: Na 153(H), BUN 46(H)  NUTRITION - FOCUSED PHYSICAL EXAM:    Most Recent Value  Orbital Region  Severe depletion  Upper Arm Region  Severe depletion  Thoracic and Lumbar Region  Severe depletion  Buccal Region  Severe depletion  Temple Region  Severe depletion  Clavicle Bone Region  Severe depletion  Clavicle and Acromion Bone Region  Severe depletion  Scapular Bone Region  Severe depletion  Dorsal Hand  Severe depletion  Patellar Region  Severe depletion  Anterior Thigh Region  Severe depletion  Posterior Calf Region  Severe depletion  Edema (RD Assessment)  None  Hair  Reviewed  Eyes  Reviewed  Mouth  Reviewed  Skin  Reviewed  Nails  Reviewed     Diet Order:    Diet Order            Diet NPO time specified  Diet effective now             EDUCATION NEEDS:   Education needs have been addressed  Skin:  Skin Assessment: Reviewed RN Assessment(buttocks wound, MASD)  Last BM:  pta  Height:   Ht Readings from Last 1 Encounters:  04/21/20 5\' 6"  (1.676 m)   Weight:   Wt Readings from Last 1 Encounters:  04/21/20 37.1 kg   Ideal Body Weight:  64.5 kg  BMI:  Body mass index is 13.19 kg/m.  Estimated Nutritional Needs:   Kcal:  1600-1800kcal/day  Protein:  70-80g/day  Fluid:  1.2-1.5L/day  Koleen Distance MS, RD, LDN Please refer to [facility] for RD and/or RD on-call/weekend/after hours pager

## 2020-04-22 NOTE — Progress Notes (Signed)
Request to IR for evaluation of leaking g-tube - per ED RN tube leaking both feeding and flush material today. Patient reports intermittent leaking from gastrostomy insertion site at home at times, typically there is split gauze placed between the g tube and his skin.   Patient seen in ED with Dr. Anselm Pancoast - after cinching g-tube to interior abdominal wall no leaking was noted after flushing with 20 mL NS. Patient denied pain with flushing.  G-tube appears to be functioning well after ensuring the bumper is secure to the interior abdominal wall, however we will obtain a g-tube injection to confirm appropriate positioning prior to restarting feeds.  Please call IR with questions or concerns.  Candiss Norse, PA-C

## 2020-04-22 NOTE — ED Notes (Signed)
Lab called by this nurse and was asked to have member come to ED to draw blood on pt due to hard stick. Informed that pt would be added to list

## 2020-04-22 NOTE — Progress Notes (Signed)
Pt requesting eye drops for itching and burning, MD notified.

## 2020-04-23 ENCOUNTER — Inpatient Hospital Stay: Payer: Medicaid Other

## 2020-04-23 ENCOUNTER — Encounter: Payer: Self-pay | Admitting: Internal Medicine

## 2020-04-23 DIAGNOSIS — J209 Acute bronchitis, unspecified: Secondary | ICD-10-CM

## 2020-04-23 DIAGNOSIS — E870 Hyperosmolality and hypernatremia: Secondary | ICD-10-CM

## 2020-04-23 LAB — GLUCOSE, CAPILLARY
Glucose-Capillary: 199 mg/dL — ABNORMAL HIGH (ref 70–99)
Glucose-Capillary: 150 mg/dL — ABNORMAL HIGH (ref 70–99)
Glucose-Capillary: 130 mg/dL — ABNORMAL HIGH (ref 70–99)
Glucose-Capillary: 131 mg/dL — ABNORMAL HIGH (ref 70–99)
Glucose-Capillary: 96 mg/dL (ref 70–99)

## 2020-04-23 MED ORDER — IPRATROPIUM-ALBUTEROL 0.5-2.5 (3) MG/3ML IN SOLN
3.0000 mL | Freq: Four times a day (QID) | RESPIRATORY_TRACT | Status: DC
  Administered 2020-04-23 – 2020-04-30 (×19): 3 mL via RESPIRATORY_TRACT
  Filled 2020-04-23 (×25): qty 3

## 2020-04-23 MED ORDER — SODIUM CHLORIDE 0.9 % IV SOLN
2.0000 g | Freq: Two times a day (BID) | INTRAVENOUS | Status: DC
  Filled 2020-04-23: qty 2

## 2020-04-23 MED ORDER — STYE 31.9-57.7 % OP OINT
1.0000 "application " | TOPICAL_OINTMENT | OPHTHALMIC | Status: DC | PRN
  Filled 2020-04-23: qty 3.5

## 2020-04-23 MED ORDER — HYDROCODONE-ACETAMINOPHEN 7.5-325 MG/15ML PO SOLN: 10.0000 mL | Freq: Four times a day (QID) | ORAL | Status: DC | PRN

## 2020-04-23 MED ORDER — HYDROCODONE-ACETAMINOPHEN 7.5-325 MG/15ML PO SOLN
10.0000 mL | Freq: Four times a day (QID) | ORAL | Status: DC | PRN
  Administered 2020-04-23 – 2020-05-07 (×9): 10 mL
  Filled 2020-04-23 (×10): qty 15

## 2020-04-23 MED ORDER — MORPHINE SULFATE (PF) 2 MG/ML IV SOLN
2.0000 mg | Freq: Four times a day (QID) | INTRAVENOUS | Status: DC | PRN
  Administered 2020-04-23 – 2020-04-26 (×9): 2 mg via INTRAVENOUS
  Filled 2020-04-23 (×10): qty 1

## 2020-04-23 MED ORDER — FREE WATER
300.0000 mL | Freq: Two times a day (BID) | Status: DC
  Administered 2020-04-23 – 2020-04-27 (×7): 300 mL

## 2020-04-23 MED ORDER — METHYLPREDNISOLONE SODIUM SUCC 40 MG IJ SOLR
40.0000 mg | Freq: Two times a day (BID) | INTRAMUSCULAR | Status: DC
  Administered 2020-04-23 – 2020-04-25 (×5): 40 mg via INTRAVENOUS
  Filled 2020-04-23 (×5): qty 1

## 2020-04-23 NOTE — Progress Notes (Signed)
Patient ID: Thomas [name], male   DOB: 09/03/1971, 49 y.o.   MRN: [id number] [facility] PROGRESS NOTE  Thomas [name] [id number] DOB: [birth year] DOA: 04/21/2020 PCP: [name], NP  HPI/Subjective: Patient was upset with me that I turned his TV off.  Asked me where his oxygen was because he cannot breathe.  States his eyes are still burning and he had a ointment rather than a drop the last time which helped him out.  Still has pain in his right side and back  Objective: Vitals:   04/23/20 1132 04/23/20 1616  BP: 101/65 105/67  Pulse: 62 70  Resp: 18 18  Temp: 97.8 F (36.6 C) (!) 97.5 F (36.4 C)  SpO2:  100%    Filed Weights   04/21/20 1532 04/23/20 0334  Weight: 37.1 kg 35.5 kg    ROS: Review of Systems  Constitutional: Negative for fever.  Eyes: Positive for redness.  Respiratory: Positive for cough, shortness of breath and wheezing.   Cardiovascular: Negative for chest pain.  Gastrointestinal: Positive for abdominal pain. Negative for nausea and vomiting.  Genitourinary: Negative for dysuria.  Musculoskeletal: Negative for joint pain.  Neurological: Negative for headaches.   Exam: Physical Exam  Constitutional: He is oriented to person, place, and time.  HENT:  Nose: No mucosal edema.  Mouth/Throat: No oropharyngeal exudate or posterior oropharyngeal edema.  Eyes: Conjunctivae and lids are normal.  Neck: Carotid bruit is not present.  Cardiovascular: S1 normal and S2 normal. Exam reveals no gallop.  No murmur heard. Respiratory: No respiratory distress. He has decreased breath sounds in the right lower field and the left lower field. He has no wheezes. He has no rhonchi. He has no rales.  GI: Soft. Bowel sounds are normal. There is no abdominal tenderness.  Musculoskeletal:     Right ankle: No swelling.     Left ankle: No swelling.  Lymphadenopathy:    He has no cervical adenopathy.  Neurological: He is alert and oriented to person, place, and  time. No cranial nerve deficit.  Skin: Skin is warm. No rash noted. Nails show no clubbing.  Irritation near the stoma in his chest probably secondary to the tape of the ostomy that is over it. Tube feeds seen leaking around the G-tube site this morning when I saw him  Psychiatric: He has a normal mood and affect.      Data Reviewed: Basic Metabolic Panel: Recent Labs  Lab 04/21/20 1616 04/22/20 1055  NA 150* 153*  K 5.0 4.2  CL 105 116*  CO2 26 28  GLUCOSE 76 102*  BUN 68* 46*  CREATININE 1.69* 0.94  CALCIUM 10.6* 10.0   Liver Function Tests: Recent Labs  Lab 04/21/20 1616 04/22/20 1055  AST 26 17  ALT 11 13  ALKPHOS 59 54  BILITOT 1.3* 0.9  PROT 7.6 7.0  ALBUMIN 4.7 4.4   CBC: Recent Labs  Lab 04/21/20 1616 04/22/20 1055  WBC 8.5 6.8  NEUTROABS 5.9  --   HGB 14.1 13.8  HCT 46.1 45.7  MCV 94.7 94.8  PLT 353 140*    CBG: Recent Labs  Lab 04/22/20 1948 04/23/20 0337 04/23/20 0720 04/23/20 1138 04/23/20 1619  GLUCAP 68* 199* 150* 130* 131*    Recent Results (from the past 240 hour(s))  Urine Culture     Status: None   Collection Time: 04/21/20  4:46 PM   Specimen: Urine, Random  Result Value Ref Range Status   Specimen Description  Final    URINE, RANDOM Performed at [facility], [address] [place], [place] [address]    Special Requests   Final    NONE Performed at [facility], [address], [place], [place] [address]    Culture   Final    NO GROWTH Performed at [facility], [place] [address] [place], [place] [address]    Report Status 04/22/2020 FINAL  Final  Blood culture (routine x 2)     Status: None (Preliminary result)   Collection Time: 04/21/20  5:02 PM   Specimen: BLOOD  Result Value Ref Range Status   Specimen Description BLOOD BLOOD RIGHT HAND  Final   Special Requests   Final    BOTTLES DRAWN AEROBIC AND ANAEROBIC Blood Culture results may not be optimal due to an inadequate volume of  blood received in culture bottles   Culture   Final    NO GROWTH 2 DAYS Performed at [facility], [address], [place], [place] [address]    Report Status PENDING  Incomplete  SARS Coronavirus 2 by RT PCR (hospital order, performed in [place] hospital lab) Nasopharyngeal Nasopharyngeal Swab     Status: None   Collection Time: 04/21/20  7:22 PM   Specimen: Nasopharyngeal Swab  Result Value Ref Range Status   SARS Coronavirus 2 NEGATIVE NEGATIVE Final    Comment: (NOTE) SARS-CoV-2 target nucleic acids are NOT DETECTED. The SARS-CoV-2 RNA is generally detectable in upper and lower respiratory specimens during the acute phase of infection. The lowest concentration of SARS-CoV-2 viral copies this assay can detect is 250 copies / mL. A negative result does not preclude SARS-CoV-2 infection and should not be used as the sole basis for treatment or other patient management decisions.  A negative result may occur with improper specimen collection / handling, submission of specimen other than nasopharyngeal swab, presence of viral mutation(s) within the areas targeted by this assay, and inadequate number of viral copies (<250 copies / mL). A negative result must be combined with clinical observations, patient history, and epidemiological information. Fact Sheet for Patients:   StrictlyIdeas.no Fact Sheet for Healthcare Providers: BankingDealers.co.za This test is not yet approved or cleared  by the [place] FDA and has been authorized for detection and/or diagnosis of SARS-CoV-2 by FDA under an Emergency Use Authorization (EUA).  This EUA will remain in effect (meaning this test can be used) for the duration of the COVID-19 declaration under Section 564(b)(1) of the Act, 21 U.S.C. section 360bbb-3(b)(1), unless the authorization is terminated or revoked sooner. Performed at [facility], [address]  [place], [place] [address]      Studies: DG Ribs Unilateral Right  Result Date: 04/23/2020 CLINICAL DATA:  Pain without trauma EXAM: RIGHT RIBS - 2 VIEW COMPARISON:  04/21/2020 and previous FINDINGS: Sternotomy wires. No definite acute fracture. No definite pneumothorax. Postop changes in the anterior right hemithorax. Osteopenia. Retrievable IVC filter with leg fracture noted. 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 IMPRESSION: 1. No  fracture, pneumothorax, other acute finding. Electronically Signed   By: Lucrezia Europe M.D.   On: 04/23/2020 10:32   DG Abd Portable 1 View  Result Date: 04/21/2020 CLINICAL DATA:  G-tube placement EXAM: PORTABLE ABDOMEN - 1 VIEW COMPARISON:  04/08/2020, CT 03/29/2019 FINDINGS: Gastrostomy balloon projects over the body of the stomach. There is opacification of the gastric fundus and proximal duodenum.  Amorphous contrast about the balloon. Scattered peritoneal calcifications as before. Nonobstructed gas pattern. IMPRESSION: 1. Gastrostomy balloon projects over the body of the stomach 2. Amorphous contrast about the balloon, potentially representing contrast leakage about the gastrostomy tract Electronically Signed   By: Donavan Foil M.D.   On: 04/21/2020 21:46   DG Replc Duoden/Jejuno Tube Percut W/Fluoro  Result Date: 04/22/2020 INDICATION: 49 year old with leaking gastrostomy tube. EXAM: GASTROSTOMY TUBE INJECTION AND MANIPULATION MEDICATIONS: None ANESTHESIA/SEDATION: None CONTRAST:  40 mL Omnipaque 300-administered into the gastric lumen. FLUOROSCOPY TIME:  Fluoroscopy Time: 1 minutes, 0000000 mGy COMPLICATIONS: None immediate. PROCEDURE: Patient was placed supine on the interventional table. Scout image was obtained of the abdomen. The gastrostomy tube was injected with contrast under fluoroscopy. The balloon was deflated and partially pulled back. The tube was advanced back into the  stomach under fluoroscopy and confirmed placement in the gastric lumen. The balloon was inflated again. Additional contrast was injected to confirm placement in the gastric lumen. FINDINGS: Contrast was filling the stomach but the gastrostomy tube was not clearly within the gastric lumen but probably in the subcutaneous tissues. After the balloon was deflated, the gastric tube was successfully advanced back into the gastric lumen. The balloon was moving freely within the gastric lumen at the end of the procedure. Contrast injection confirmed placement in the stomach. Again noted is an abnormal IVC filter. One of the filter legs may be broken and slightly cephalad to the filter hook. Abnormal shape of the IVC filter. IMPRESSION: 1. Successful repositioning of the gastrostomy tube within the stomach lumen. Gastrostomy tube is now well positioned in the stomach and ready for use. 2. Abnormal appearance of the IVC filter with evidence for a broken or malpositioned filter leg. Electronically Signed   By: Markus Daft M.D.   On: 04/22/2020 11:36    Scheduled Meds: . Chlorhexidine Gluconate Cloth  6 each Topical Daily  . enoxaparin (LOVENOX) injection  30 mg Subcutaneous Q24H  . feeding supplement (OSMOLITE 1.5 CAL)  1,000 mL Per Tube Q24H  . finasteride  5 mg Oral Daily  . free water  120 mL Per Tube BID  . free water  300 mL Per Tube BID  . ipratropium-albuterol  3 mL Nebulization Q6H  . lidocaine  1 patch Transdermal Q24H  . methylPREDNISolone (SOLU-MEDROL) injection  40 mg Intravenous Q12H  . thiamine  100 mg Per Tube Daily  . traZODone  50 mg Per Tube QHS      Assessment/Plan:  1. Acute bronchitis and bronchospasm.  Start steroids and nebulizer treatments.  Get pulse ox on room air.  2. Sepsis ruled out.  Discontinue antibiotics since urine culture negative.  3. Acute kidney injury improved with IV fluids. 4. Chronic hypotension.  Patient's blood pressure is normally low.  But better  today 5. G-tube malfunction with tube feeds coming out of his ostomy site.  Interventional radiology changed out his feeding tube yesterday.  Still having some leakage from there and they reevaluated today and he will likely continue to have some leakage. 6. Abnormal imaging of IVC filter.  Appreciate vascular opinion.  Because the chronicity of the filter placed removing it may cause more issues than just leaving it in. 7. Acute urinary retention on Proscar.  Foley catheter.  Urology follow-up as outpatient 8. Weakness.  Physical therapy recommends home health. 9. Start liquid diet since no further procedures.  This comes out of the ostomy in his chest.  Wound care consultation for ostomy on  the chest. 10. Severe protein calorie malnutrition.  Continue tube feedings. 11. Pain in his right ribs, back, side, knees.  Decrease frequency of IV morphine, liquid pain medication through feeding tube 12. Eye irritation.  Spoke with pharmacist to try to order an eye ointment for him. 13. Hypernatremia dietary to adjust tube feeds     Code Status:     Code Status Orders  (From admission, onward)         Start     Ordered   04/21/20 1920  Full code  Continuous     04/21/20 1926        Code Status History    Date Active Date Inactive Code Status Order ID Comments User Context   04/04/2020 1915 04/09/2020 1956 Full Code [id number]  Lenore Cordia, MD ED   03/31/2020 0149 04/02/2020 2342 Full Code [id number]  Neena Rhymes, MD ED   03/12/2020 1612 03/16/2020 1842 Full Code [id number]  Karmen Bongo, MD ED   01/27/2020 2253 02/01/2020 2132 Full Code [id number]  Bethena Roys, MD Inpatient   01/13/2020 1447 01/19/2020 0253 Full Code [id number]  Norval Morton, MD ED   01/13/2020 1237 01/13/2020 1447 Full Code [id number]  Jacqlyn Larsen, PA-C ED   12/19/2019 0052 12/22/2019 1823 Full Code [id number]  Bethena Roys, MD Inpatient   Advance Care Planning Activity     Disposition Plan: Status is:  Inpatient  Dispo: The patient is from: Home              Anticipated d/c is to: Home              Anticipated d/c date is: 04/24/2020 or 04/25/2020              Patient currently receiving IV steroids for acute bronchitis and wheeze.  Reassess respiratory status tomorrow.  Check pulse ox on room air.  Consultants:  Interventional radiology  Vascular surgery  Antibiotics:  Cefepime  Time spent: 28 minutes  [place]

## 2020-04-23 NOTE — Progress Notes (Signed)
PT Cancellation Note  Patient Details Name: Thomas [name] MRN: [id number] DOB: 22-Jan-1971   Cancelled Treatment:    Reason Eval/Treat Not Completed: Other (comment). Consult received and chart reviewed. Pt with multiple complications this afternoon regarding PEG tube and esophageal drainage. Pt unable to work with therapy at this time and reports he may have pending SX tomorrow for fixation. RN in room addressing needs. Will re-attempt.   [name] 04/23/2020, 1:41 PM [name], PT, DPT [phone]
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
## 2020-04-23 NOTE — Progress Notes (Signed)
  Patient continues to have leakage from around Gastrostomy tube.  Discussed with Dr. Anselm Pancoast.  Recommend conversion to Gastro -Jejunostomy tube so feedings will be post-pyloric, hopefully minimizes leakage, however patient is s/p lower hemi-sternotomy, partial esophagectomy done 06/28/19, then s/p revision of spit fistula via right anterior thoracotomy on 07/05/2019. He does take PO.   It appears that most PO liquids go into this fistula, but if he does have liquid go into his stomach (unsure if there was esophageal re-anastamosis), he could potentially continue to have some leakage from the gastrostomy tube site.  Thomas Grieve Adanely Reynoso PA-C 04/23/2020 12:56 PM

## 2020-04-24 ENCOUNTER — Inpatient Hospital Stay: Payer: Medicaid Other

## 2020-04-24 HISTORY — PX: IR GASTR TUBE CONVERT GASTR-JEJ PER W/FL MOD SED: IMG2332

## 2020-04-24 LAB — BASIC METABOLIC PANEL
Anion gap: 5 (ref 5–15)
BUN: 22 mg/dL — ABNORMAL HIGH (ref 6–20)
CO2: 31 mmol/L (ref 22–32)
Calcium: 9.7 mg/dL (ref 8.9–10.3)
Chloride: 113 mmol/L — ABNORMAL HIGH (ref 98–111)
Creatinine, Ser: 0.61 mg/dL (ref 0.61–1.24)
GFR calc Af Amer: >60 mL/min (ref >60)
GFR calc non Af Amer: >60 mL/min (ref >60)
Glucose, Bld: 155 mg/dL — ABNORMAL HIGH (ref 70–99)
Potassium: 4.4 mmol/L (ref 3.5–5.1)
Sodium: 149 mmol/L — ABNORMAL HIGH (ref 135–145)

## 2020-04-24 LAB — GLUCOSE, CAPILLARY
Glucose-Capillary: 110 mg/dL — ABNORMAL HIGH (ref 70–99)
Glucose-Capillary: 135 mg/dL — ABNORMAL HIGH (ref 70–99)
Glucose-Capillary: 141 mg/dL — ABNORMAL HIGH (ref 70–99)
Glucose-Capillary: 148 mg/dL — ABNORMAL HIGH (ref 70–99)
Glucose-Capillary: 83 mg/dL (ref 70–99)
Glucose-Capillary: 97 mg/dL (ref 70–99)

## 2020-04-24 LAB — CBC
HCT: 39.4 % (ref 39.0–52.0)
Hemoglobin: 11.9 g/dL — ABNORMAL LOW (ref 13.0–17.0)
MCH: 29.2 pg (ref 26.0–34.0)
MCHC: 30.2 g/dL (ref 30.0–36.0)
MCV: 96.8 fL (ref 80.0–100.0)
Platelets: 165 10*3/uL (ref 150–400)
RBC: 4.07 MIL/uL — ABNORMAL LOW (ref 4.22–5.81)
RDW: 17.2 % — ABNORMAL HIGH (ref 11.5–15.5)
WBC: 5.3 10*3/uL (ref 4.0–10.5)
nRBC: 0 % (ref 0.0–0.2)

## 2020-04-24 MED ORDER — FENTANYL CITRATE (PF) 100 MCG/2ML IJ SOLN
INTRAMUSCULAR | Status: AC
  Filled 2020-04-24: qty 2

## 2020-04-24 MED ORDER — LIDOCAINE VISCOUS HCL 2 % MT SOLN
OROMUCOSAL | Status: AC
  Filled 2020-04-24: qty 15

## 2020-04-24 MED ORDER — LACTATED RINGERS IV BOLUS
500.0000 mL | Freq: Once | INTRAVENOUS | Status: AC
  Administered 2020-04-24: 500 mL via INTRAVENOUS

## 2020-04-24 MED ORDER — IODIXANOL 320 MG/ML IV SOLN
50.0000 mL | Freq: Once | INTRAVENOUS | Status: AC | PRN
  Administered 2020-04-24: 30 mL

## 2020-04-24 MED ORDER — OSMOLITE 1.5 CAL PO LIQD: 1000.0000 mL | ORAL | Status: DC

## 2020-04-24 MED ORDER — MIDAZOLAM HCL 5 MG/5ML IJ SOLN
INTRAMUSCULAR | Status: AC | PRN
  Administered 2020-04-24: 1 mg via INTRAVENOUS

## 2020-04-24 MED ORDER — MIDAZOLAM HCL 5 MG/5ML IJ SOLN
INTRAMUSCULAR | Status: AC
  Filled 2020-04-24: qty 5

## 2020-04-24 MED ORDER — FENTANYL CITRATE (PF) 100 MCG/2ML IJ SOLN
INTRAMUSCULAR | Status: AC | PRN
  Administered 2020-04-24: 50 ug via INTRAVENOUS

## 2020-04-24 NOTE — Sedation Documentation (Signed)
Total conscious sedation: Versed 1 mg IV, Fentanyl 50 mcg IV. Pt. Tolerated procedure well.

## 2020-04-24 NOTE — TOC Initial Note (Signed)
Transition of Care [facility]) - Initial/Assessment Note    Patient Details  Name: Thomas [name] MRN: [id number] Date of Birth: Feb 28, 1971  Transition of Care [facility]) CM/SW Contact:    Victorino Dike, RN Phone Number: 04/24/2020, 10:17 AM  Clinical Narrative:                  Met with patient in the room.  He lives with his mother in [place], he reports having all necessary DME.  He would like to continue [name] services with [facility].  Verified these services with Corene Cornea.  (RN and PT).  He has questions about his [employer], contacted [place] to please contact or see patient for his current service questions.   Expected Discharge Plan: [place] Barriers to Discharge: Continued Medical Work up   Patient Goals and CMS Choice        Expected Discharge Plan and Services Expected Discharge Plan: [place]   Discharge Planning Services: CM Consult Post Acute Care Choice: Resumption of Svcs/PTA Provider Living arrangements for the past 2 months: Single Family Home                   DME Agency: [employer] Date DME Agency Contacted: 04/24/20 Time DME Agency Contacted: 1916 Representative spoke with at DME Agency: Brad Ginger Blue Arranged: RN, PT [place] Agency: [place] ([place]) Date [place]: 04/24/20 Time [place]: 47 Representative spoke with at [place]: Corene Cornea  Prior Living Arrangements/Services Living arrangements for the past 2 months: [place] Lives with:: Parents Patient language and need for interpreter reviewed:: Yes Do you feel safe going back to the place where you live?: Yes      Need for Family Participation in Patient Care: Yes (Comment) Care giver support system in place?: Yes (comment) Current home services: DME Criminal Activity/Legal Involvement Pertinent to Current Situation/Hospitalization: No - Comment as needed  Activities of Daily Living Home Assistive  Devices/Equipment: Cane (specify quad or straight) ADL Screening (condition at time of admission) Patient's cognitive ability adequate to safely complete daily activities?: Yes Is the patient deaf or have difficulty hearing?: No Does the patient have difficulty seeing, even when wearing glasses/contacts?: No Does the patient have difficulty concentrating, remembering, or making decisions?: No Patient able to express need for assistance with ADLs?: Yes Does the patient have difficulty dressing or bathing?: Yes Independently performs ADLs?: Yes (appropriate for developmental age) Does the patient have difficulty walking or climbing stairs?: Yes Weakness of Legs: Both Weakness of Arms/Hands: Both  Permission Sought/Granted                  Emotional Assessment Appearance:: Appears stated age Attitude/Demeanor/Rapport: Engaged Affect (typically observed): Appropriate, Accepting Orientation: : Oriented to Self, Oriented to Place, Oriented to  Time, Oriented to Situation Alcohol / Substance Use: Not Applicable Psych Involvement: No (comment)  Admission diagnosis:  Hypernatremia [E87.0] Acute cystitis without hematuria [N30.00] AKI (acute kidney injury) ([place]) [N17.9] Leaking percutaneous endoscopic gastrostomy (PEG) tube ([place]) [K94.23] Leaking PEG tube ([place]) [K94.23] Sepsis ([place]) [A41.9] Patient Active Problem List   Diagnosis Date Noted  . Acute bronchitis   . Weakness   . Severe sepsis ([place]) 04/21/2020  . AKI (acute kidney injury) ([place]) 04/21/2020  . History of esophagotomy 04/21/2020  . Sepsis ([place]) 04/21/2020  . Hypotension 04/21/2020  . Flank pain, acute on chronic 04/21/2020  . Nephrolithiasis 04/21/2020  . Acute UTI 04/05/2020  .  Acute urinary retention 04/04/2020  . Complicated UTI (urinary tract infection) 04/04/2020  . Leaking percutaneous endoscopic gastrostomy (PEG) tube ([place]) 04/04/2020  . Dehydration   . Dehydration with hypernatremia 03/12/2020  . Chronic  pain syndrome 03/12/2020  . Fall 02/17/2020  . 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 Fluid collection at surgical site 02/17/2020  . 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 Incidental pulmonary nodule 02/17/2020  . Ketosis ([place]) 02/17/2020  . 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 Low grade fever 01/13/2020  . Gastrostomy tube in place ([place]) 01/13/2020  . Dizziness 12/31/2019  . 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 Protein-calorie malnutrition, severe 12/20/2019  . Hypernatremia 12/18/2019  . Acute esophageal ulcer with perforation 06/11/2019  . Right-sided chest pain 01/31/2019  . Anastomotic ulcer 01/31/2019   PCP:  [name], NP Pharmacy:   [place], [place] [address] [place] [address] Phone: [phone] Fax: [phone]  [id number] [place], [place]Phone: [phone]: [phone]    Social Determinants of Health (SDOH) Interventions    Readmission Risk Interventions Readmission Risk Prevention Plan 04/05/2020 04/01/2020  Transportation Screening Complete Complete  PCP or Specialist Appt within 3-5 Days - Complete  HRI or [place]- Complete  Social Work Consult for [place]Counseling - Complete  Palliative Care Screening - Not Applicable  Medication Review (Press photographer Complete Complete  HRI or [place] Applic[place] -  Some recent data might be hidden

## 2020-04-24 NOTE — Progress Notes (Signed)
PROGRESS NOTE    Thomas [name]  [id number] DOB: [birth year] DOA: 04/21/2020 PCP: [name], NP      Assessment & Plan:   Principal Problem:   Severe sepsis ([place]) Active Problems:   Hypernatremia   Protein-calorie malnutrition, severe   Dehydration with hypernatremia   Acute urinary retention   Complicated UTI (urinary tract infection)   Leaking percutaneous endoscopic gastrostomy (PEG) tube (HCC)   Gastrojejunostomy tube status (HCC)   AKI (acute kidney injury) ([place])   Hypotension   Flank pain, acute on chronic   Nephrolithiasis   Weakness   Acute bronchitis  Acute bronchitis and bronchospasm:  continue steroids and bronchodilators.   Sepsis: ruled out.  Discontinue antibiotics since urine culture negative.   AKI:  Resolved  Chronic hypotension: blood pressure is normally low. Continue to monitor   G-tube malfunction: with tube feeds coming out of his ostomy site. Will repair G tube today. 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  Acute urinary retention: continue on finasteride. Continue w/ foley catheter. Urology follow-up as outpatient  Weakness:  PT recommends home health.  Severe protein calorie malnutrition: continue tube feedings when G tube working appropriately.  Chronic pain: in his right ribs, back, side, knees.  Morphine prn   Eye irritation: continue on stye ointment   Hypernatremia: free water deficit 0.5L. Continue free water flushes when G tube working appropriately     DVT prophylaxis: lovenox  Code Status: full  Family Communication:  Disposition Plan: d/c home w/ home health. Unlikely any barriers. Getting G tube repaired today   Status is: Inpatient  Remains inpatient appropriate because:Ongoing diagnostic testing needed not appropriate for  outpatient work up   Dispo: The patient is from: Home              Anticipated d/c is to: Home w/ home health               Anticipated d/c date is: 2 days              Patient currently is not medically stable to d/c.    Consultants:      Procedures:    Antimicrobials:   Subjective: Pt c/o fatigue   Objective: Vitals:   04/23/20 2032 04/24/20 0424 04/24/20 0734 04/24/20 1200  BP: (!) 92/59 (!) 84/66 98/66 101/62  Pulse: 72 (!) 58 62 66  Resp: 20 19 17 18   Temp: 97.6 F (36.4 C) 98.2 F (36.8 C) 98.2 F (36.8 C) 97.9 F (36.6 C)  TempSrc: Oral Oral Axillary   SpO2: 100% 98% 98% 100%  Weight:  38 kg    Height:        Intake/Output Summary (Last 24 hours) at 04/24/2020 1329 Last data filed at 04/24/2020 1200 Gross per 24 hour  Intake 1680 ml  Output 10225 ml  Net -8545 ml   Filed Weights   04/21/20 1532 04/23/20 0334 04/24/20 0424  Weight: 37.1 kg 35.5 kg 38 kg    Examination:  General exam: Appears calm and comfortable. Cachetic  Respiratory system: Clear to auscultation. Respiratory effort normal. Cardiovascular system: S1 & S2+. No  rubs, gallops or clicks. No pedal edema. Gastrointestinal system: Abdomen is nondistended, soft and nontender. Hypoactive bowel sounds heard. Central nervous system: Alert and oriented. Moves all 4 extremities  Psychiatry: Judgement and insight appear normal. Mood & affect appropriate.     Data Reviewed: I have personally reviewed following labs and imaging studies  CBC: Recent Labs  Lab 04/21/20 1616 04/22/20 1055 04/24/20 0626  WBC 8.5 6.8 5.3  NEUTROABS 5.9  --   --   HGB 14.1 13.8 11.9*  HCT 46.1 45.7 39.4  MCV 94.7 94.8 96.8  PLT 353 140* [id number]   Basic Metabolic Panel: Recent Labs  Lab 04/21/20 1616 04/22/20 1055 04/24/20 0632  NA 150* 153* 149*  K 5.0 4.2 4.4  CL 105 116* 113*  CO2 26 28 31   GLUCOSE 76 102* 155*  BUN 68* 46* 22*  CREATININE 1.69* 0.94 0.61  CALCIUM 10.6* 10.0 9.7    GFR: Estimated Creatinine Clearance: 60 mL/min (by C-G formula based on SCr of 0.61 mg/dL). Liver Function Tests: Recent Labs  Lab 04/21/20 1616 04/22/20 1055  AST 26 17  ALT 11 13  ALKPHOS 59 54  BILITOT 1.3* 0.9  PROT 7.6 7.0  ALBUMIN 4.7 4.4   No results for input(s): LIPASE, AMYLASE in the last 168 hours. No results for input(s): AMMONIA in the last 168 hours. Coagulation Profile: Recent Labs  Lab 04/22/20 1055  INR 1.0   Cardiac Enzymes: No results for input(s): CKTOTAL, CKMB, CKMBINDEX, TROPONINI in the last 168 hours. BNP (last 3 results) No results for input(s): PROBNP in the last 8760 hours. HbA1C: No results for input(s): HGBA1C in the last 72 hours. CBG: Recent Labs  Lab 04/23/20 2048 04/24/20 0000 04/24/20 0420 04/24/20 0733 04/24/20 1153  GLUCAP 96 148* 141* 110* 83   Lipid Profile: No results for input(s): CHOL, HDL, LDLCALC, TRIG, CHOLHDL, LDLDIRECT in the last 72 hours. Thyroid Function Tests: No results for input(s): TSH, T4TOTAL, FREET4, T3FREE, THYROIDAB in the last 72 hours. Anemia Panel: No results for input(s): VITAMINB12, FOLATE, FERRITIN, TIBC, IRON, RETICCTPCT in the last 72 hours. Sepsis Labs: Recent Labs  Lab 04/21/20 1701 04/22/20 1055  PROCALCITON  --  <0.10  LATICACIDVEN 1.9  --     Recent Results (from the past 240 hour(s))  Urine Culture     Status: None   Collection Time: 04/21/20  4:46 PM   Specimen: Urine, Random  Result Value Ref Range Status   Specimen Description   Final    URINE, RANDOM Performed at [facility], [address] [place], [place] [address]    Special Requests   Final    NONE Performed at [facility], [address] [place], [place] [address]    Culture   Final    NO GROWTH Performed at [facility], [place] [address], [place], [place] [address]    Report Status 04/22/2020 FINAL  Final  Blood culture (routine x 2)     Status: None (Preliminary result)   Collection  Time: 04/21/20  5:02 PM   Specimen: BLOOD  Result Value Ref Range Status   Specimen Description BLOOD BLOOD RIGHT HAND  Final   Special Requests   Final    BOTTLES DRAWN AEROBIC AND ANAEROBIC Blood Culture results may not be optimal due to an inadequate volume of blood received in culture bottles   Culture   Final    NO GROWTH 3 DAYS Performed at [facility], [address] [place] [address], [place], [place] [address]    Report Status PENDING  Incomplete  SARS Coronavirus 2 by RT PCR (hospital order, performed in [facility] hospital lab) Nasopharyngeal Nasopharyngeal Swab  Status: None   Collection Time: 04/21/20  7:22 PM   Specimen: Nasopharyngeal Swab  Result Value Ref Range Status   SARS Coronavirus 2 NEGATIVE NEGATIVE Final    Comment: (NOTE) SARS-CoV-2 target nucleic acids are NOT DETECTED. The SARS-CoV-2 RNA is generally detectable in upper and lower respiratory specimens during the acute phase of infection. The lowest concentration of SARS-CoV-2 viral copies this assay can detect is 250 copies / mL. A negative result does not preclude SARS-CoV-2 infection and should not be used as the sole basis for treatment or other patient management decisions.  A negative result may occur with improper specimen collection / handling, submission of specimen other than nasopharyngeal swab, presence of viral mutation(s) within the areas targeted by this assay, and inadequate number of viral copies (<250 copies / mL). A negative result must be combined with clinical observations, patient history, and epidemiological information. Fact Sheet for Patients:   StrictlyIdeas.no Fact Sheet for Healthcare Providers: BankingDealers.co.za This test is not yet approved or cleared  by the [place] FDA and has been authorized for detection and/or diagnosis of SARS-CoV-2 by FDA under an Emergency Use Authorization (EUA).  This EUA will remain in effect  (meaning this test can be used) for the duration of the COVID-19 declaration under Section 564(b)(1) of the Act, 21 U.S.C. section 360bbb-3(b)(1), unless the authorization is terminated or revoked sooner. Performed at [facility], [address], [place], [place] [address]          Radiology Studies: DG Ribs Unilateral Right  Result Date: 04/23/2020 CLINICAL DATA:  Pain without trauma EXAM: RIGHT RIBS - 2 VIEW COMPARISON:  04/21/2020 and previous FINDINGS: Sternotomy wires. No definite acute fracture. No definite pneumothorax. Postop changes in the anterior right hemithorax. Osteopenia. Retrievable IVC filter with leg fracture noted. 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 IMPRESSION: 1. No  fracture, pneumothorax, other acute finding. Electronically Signed   By: Lucrezia Europe M.D.   On: 04/23/2020 10:32        Scheduled Meds: . Chlorhexidine Gluconate Cloth  6 each Topical Daily  . enoxaparin (LOVENOX) injection  30 mg Subcutaneous Q24H  . feeding supplement (OSMOLITE 1.5 CAL)  1,000 mL Per Tube Q24H  . finasteride  5 mg Oral Daily  . free water  120 mL Per Tube BID  . free water  300 mL Per Tube BID  . ipratropium-albuterol  3 mL Nebulization Q6H  . lidocaine  1 patch Transdermal Q24H  . methylPREDNISolone (SOLU-MEDROL) injection  40 mg Intravenous Q12H  . thiamine  100 mg Per Tube Daily  . traZODone  50 mg Per Tube QHS   Continuous Infusions:   LOS: 3 days    Time spent: 30 mins     Wyvonnia Dusky, MD [facility] Pager [phone]  If 7PM-7AM, please contact night-coverage [email or web address] 04/24/2020, 1:29 PM

## 2020-04-24 NOTE — Progress Notes (Signed)
Md notified. Pt has telemetry discontinued and going for a PEG tube correction procedure. MD verbal via text for transfer to [facility] unit.

## 2020-04-24 NOTE — Progress Notes (Signed)
Patient IV infiltrated during bolus, after approx 150 cc infused. IV restarted by IVT; bolus resumed after new IV placed.

## 2020-04-24 NOTE — Progress Notes (Addendum)
Patient noted to have decreased BP 84/66 with HR 58. Previous BP was 92/59 with HR 72. Radial pulse reveals irregular HR. Not on telemetry. Patient is in NAD; alert and oriented x 4. Pt is NPO for procedure this am; tube feeds (100 cc per hour) were stopped around midnight for GI procedure.   E. Ouma notified of decreased BP, will CTM. Patient is requested pain meds prior to procedure, but informed patient he will likely receive sedation during procedure, and BP is too low at this time for additional narcotics.   [id number] Update: Received order for 500 cc bolus of Lactated Ringers; infusion started via right forearm PIV. Rationale explained to patient.

## 2020-04-24 NOTE — Procedures (Signed)
Interventional Radiology Procedure Note  Procedure: 20 fr Gtube exchg  Complications: None  Estimated Blood Loss: none  Findings: Unable to get duodenal catheter access to place a GJ tube.  Therefore, exchg the existing 20 fr balloon tip Gtube. Retention balloon inflated with 10cc fld containing 1 cc contrast.    Could upsize to 22 or 24 fr if leaking continues.  Larger sizes will be order.

## 2020-04-25 LAB — BASIC METABOLIC PANEL
Anion gap: 6 (ref 5–15)
BUN: 26 mg/dL — ABNORMAL HIGH (ref 6–20)
CO2: 31 mmol/L (ref 22–32)
Calcium: 9.5 mg/dL (ref 8.9–10.3)
Chloride: 114 mmol/L — ABNORMAL HIGH (ref 98–111)
Creatinine, Ser: 0.51 mg/dL — ABNORMAL LOW (ref 0.61–1.24)
GFR calc Af Amer: >60 mL/min (ref >60)
GFR calc non Af Amer: >60 mL/min (ref >60)
Glucose, Bld: 126 mg/dL — ABNORMAL HIGH (ref 70–99)
Potassium: 4.8 mmol/L (ref 3.5–5.1)
Sodium: 151 mmol/L — ABNORMAL HIGH (ref 135–145)

## 2020-04-25 LAB — GLUCOSE, CAPILLARY
Glucose-Capillary: 83 mg/dL (ref 70–99)
Glucose-Capillary: 97 mg/dL (ref 70–99)
Glucose-Capillary: 77 mg/dL (ref 70–99)

## 2020-04-25 LAB — CBC
HCT: 36.7 % — ABNORMAL LOW (ref 39.0–52.0)
Hemoglobin: 11.1 g/dL — ABNORMAL LOW (ref 13.0–17.0)
MCH: 29.1 pg (ref 26.0–34.0)
MCHC: 30.2 g/dL (ref 30.0–36.0)
MCV: 96.3 fL (ref 80.0–100.0)
Platelets: 142 10*3/uL — ABNORMAL LOW (ref 150–400)
RBC: 3.81 MIL/uL — ABNORMAL LOW (ref 4.22–5.81)
RDW: 17.2 % — ABNORMAL HIGH (ref 11.5–15.5)
WBC: 6.7 10*3/uL (ref 4.0–10.5)
nRBC: 0 % (ref 0.0–0.2)

## 2020-04-25 MED ORDER — METHYLPREDNISOLONE SODIUM SUCC 40 MG IJ SOLR
40.0000 mg | INTRAMUSCULAR | Status: DC
  Administered 2020-04-26 – 2020-04-27 (×2): 40 mg via INTRAVENOUS
  Filled 2020-04-25 (×2): qty 1

## 2020-04-25 NOTE — TOC Progression Note (Signed)
Transition of Care [facility]) - Progression Note    Patient Details  Name: Thomas [name] MRN: [id number] Date of Birth: 08-Apr-1971  [facility]) CM/SW Contact  Melford Tullier, Gardiner Rhyme, LCSW Phone Number: 04/25/2020, 12:13 PM  Clinical Narrative:  Met with pt to discharge home tomorrow. Has a foley in place to follow up with urology as an OP. He feels he is always discharged too soon from the hospital. He has questions for [employer] have asked Zack Blank to call pt from [employer]. [place] to follow at [place] for [facility] and HHPT.     Expected Discharge Plan: [place] Barriers to Discharge: Continued Medical Work up  Expected Discharge Plan and Services Expected Discharge Plan: [place]   Discharge Planning Services: CM Consult Post Acute Care Choice: Resumption of Svcs/PTA Provider Living arrangements for the past 2 months: Single Family Home                   DME Agency: [employer] Date DME Agency Contacted: 04/24/20 Time DME Agency Contacted: [phone] Representative spoke with at DME Agency: [place]: RN, PT [place] Agency: [place] ([place]) Date [place]: 04/24/20 Time [place]: [place] Representative spoke with at [place]: [place] ([place]) Interventions    Readmission Risk Interventions Readmission Risk Prevention Plan 04/05/2020 04/01/2020  Transportation Screening Complete Complete  PCP or Specialist Appt within 3-5 Days - Complete  HRI or [place] - Complete  Social Work Consult for [place] Planning/Counseling - Complete  Palliative Care Screening - Not Applicable  Medication Review Press photographer) Complete Complete  HRI or [place] Not Applicable -  Some recent data might be hidden

## 2020-04-25 NOTE — Progress Notes (Signed)
PROGRESS NOTE    ARRIS RAUTH  [id number] DOB: 02-03-1971 DOA: 04/21/2020 PCP: [name], NP    Assessment & Plan:   Principal Problem:   Severe sepsis ([place]) Active Problems:   Hypernatremia   Protein-calorie malnutrition, severe   Dehydration with hypernatremia   Acute urinary retention   Complicated UTI (urinary tract infection)   Leaking percutaneous endoscopic gastrostomy (PEG) tube (HCC)   Gastrojejunostomy tube status (HCC)   AKI (acute kidney injury) ([place])   Hypotension   Flank pain, acute on chronic   Nephrolithiasis   Weakness   Acute bronchitis  Acute bronchitis and bronchospasm:  continue steroid taper and bronchodilators. Improving   Sepsis: ruled out.  Discontinue antibiotics since urine culture negative.   AKI:  Resolved  Chronic hypotension: blood pressure is normally low. Continue to monitor   G-tube malfunction: with tube feeds coming out of his ostomy site. Unable to place a GJ tube so exchange of the balloon tip G tube as per IR 04/24/20. No leakage so far. 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  Acute urinary retention: continue on finasteride. Continue w/ foley catheter. Urology follow-up as an outpatient  Weakness:  PT recommends home health.  Severe protein calorie malnutrition: continue tube feedings when G tube working appropriately.  Chronic pain: in his right ribs, back, side, knees.  Morphine prn   Eye irritation: continue on stye ointment   Hypernatremia: free water deficit 0.6L. Continue free water flushes via G tube   Thrombocytopenia: etiology unclear, mild. Will continue to monitor     DVT prophylaxis: lovenox  Code Status: full  Family Communication:  Disposition Plan: d/c home w/ home health. Unlikely any barriers.   Status  is: Inpatient  Remains inpatient appropriate because: electrolyte imbalance    Dispo: The patient is from: Home              Anticipated d/c is to: Home w/ home health               Anticipated d/c date is: 1 day               Patient currently is not medically stable to d/c.    Consultants:      Procedures:    Antimicrobials:   Subjective: Pt c/o malaise    Objective: Vitals:   04/24/20 1943 04/24/20 2117 04/25/20 0012 04/25/20 0401  BP: (!) 104/59 (!) 92/57 (!) 104/57 97/61  Pulse: 81 82 75 64  Resp: 18 19 19 18   Temp: (!) 97.5 F (36.4 C) 97.7 F (36.5 C) 97.8 F (36.6 C) 98.1 F (36.7 C)  TempSrc: Oral Axillary    SpO2: 93% 100% 92% 99%  Weight:      Height:        Intake/Output Summary (Last 24 hours) at 04/25/2020 0711 Last data filed at 04/25/2020 0400 Gross per 24 hour  Intake 240 ml  Output 2325 ml  Net -2085 ml   Filed Weights   04/21/20 1532 04/23/20 0334 04/24/20 0424  Weight: 37.1 kg 35.5 kg 38 kg    Examination:  General exam: Appears calm and comfortable. Cachetic  Respiratory system: Clear to auscultation. No rales. Cardiovascular system: S1 & S2+. No  rubs, gallops or clicks. No pedal edema. Gastrointestinal system: Abdomen is nondistended, soft  and nontender. Hypoactive bowel sounds heard. Central nervous system: Alert and oriented. Moves all 4 extremities  Psychiatry: Judgement and insight appear normal. Flat mood and affect      Data Reviewed: I have personally reviewed following labs and imaging studies  CBC: Recent Labs  Lab 04/21/20 1616 04/22/20 1055 04/24/20 0626 04/25/20 0619  WBC 8.5 6.8 5.3 6.7  NEUTROABS 5.9  --   --   --   HGB 14.1 13.8 11.9* 11.1*  HCT 46.1 45.7 39.4 36.7*  MCV 94.7 94.8 96.8 96.3  PLT 353 140* 165 [id number]*   Basic Metabolic Panel: Recent Labs  Lab 04/21/20 1616 04/22/20 1055 04/24/20 0632  NA 150* 153* 149*  K 5.0 4.2 4.4  CL 105 116* 113*  CO2 26 28 31   GLUCOSE 76 102* 155*  BUN  68* 46* 22*  CREATININE 1.69* 0.94 0.61  CALCIUM 10.6* 10.0 9.7   GFR: Estimated Creatinine Clearance: 60 mL/min (by C-G formula based on SCr of 0.61 mg/dL). Liver Function Tests: Recent Labs  Lab 04/21/20 1616 04/22/20 1055  AST 26 17  ALT 11 13  ALKPHOS 59 54  BILITOT 1.3* 0.9  PROT 7.6 7.0  ALBUMIN 4.7 4.4   No results for input(s): LIPASE, AMYLASE in the last 168 hours. No results for input(s): AMMONIA in the last 168 hours. Coagulation Profile: Recent Labs  Lab 04/22/20 1055  INR 1.0   Cardiac Enzymes: No results for input(s): CKTOTAL, CKMB, CKMBINDEX, TROPONINI in the last 168 hours. BNP (last 3 results) No results for input(s): PROBNP in the last 8760 hours. HbA1C: No results for input(s): HGBA1C in the last 72 hours. CBG: Recent Labs  Lab 04/24/20 0420 04/24/20 0733 04/24/20 1153 04/24/20 1650 04/24/20 1958  GLUCAP 141* 110* 83 97 135*   Lipid Profile: No results for input(s): CHOL, HDL, LDLCALC, TRIG, CHOLHDL, LDLDIRECT in the last 72 hours. Thyroid Function Tests: No results for input(s): TSH, T4TOTAL, FREET4, T3FREE, THYROIDAB in the last 72 hours. Anemia Panel: No results for input(s): VITAMINB12, FOLATE, FERRITIN, TIBC, IRON, RETICCTPCT in the last 72 hours. Sepsis Labs: Recent Labs  Lab 04/21/20 1701 04/22/20 1055  PROCALCITON  --  <0.10  LATICACIDVEN 1.9  --     Recent Results (from the past 240 hour(s))  Urine Culture     Status: None   Collection Time: 04/21/20  4:46 PM   Specimen: Urine, Random  Result Value Ref Range Status   Specimen Description   Final    URINE, RANDOM Performed at [facility], [address] [place], [place] [address]    Special Requests   Final    NONE Performed at [facility], [address] [place], [place] [address]    Culture   Final    NO GROWTH Performed at [facility], [place] [address], [place],  [address]    Report Status 04/22/2020 FINAL  Final  Blood culture  (routine x 2)     Status: None (Preliminary result)   Collection Time: 04/21/20  5:02 PM   Specimen: BLOOD  Result Value Ref Range Status   Specimen Description BLOOD BLOOD RIGHT HAND  Final   Special Requests   Final    BOTTLES DRAWN AEROBIC AND ANAEROBIC Blood Culture results may not be optimal due to an inadequate volume of blood received in culture bottles   Culture   Final    NO GROWTH 3 DAYS Performed at [facility], [address] [place], [place]  [address]    Report Status PENDING  Incomplete  SARS Coronavirus 2 by RT PCR (hospital order, performed in [facility] hospital lab) Nasopharyngeal Nasopharyngeal Swab     Status: None   Collection Time: 04/21/20  7:22 PM   Specimen: Nasopharyngeal Swab  Result Value Ref Range Status   SARS Coronavirus 2 NEGATIVE NEGATIVE Final    Comment: (NOTE) SARS-CoV-2 target nucleic acids are NOT DETECTED. The SARS-CoV-2 RNA is generally detectable in upper and lower respiratory specimens during the acute phase of infection. The lowest concentration of SARS-CoV-2 viral copies this assay can detect is 250 copies / mL. A negative result does not preclude SARS-CoV-2 infection and should not be used as the sole basis for treatment or other patient management decisions.  A negative result may occur with improper specimen collection / handling, submission of specimen other than nasopharyngeal swab, presence of viral mutation(s) within the areas targeted by this assay, and inadequate number of viral copies (<250 copies / mL). A negative result must be combined with clinical observations, patient history, and epidemiological information. Fact Sheet for Patients:   StrictlyIdeas.no Fact Sheet for Healthcare Providers: BankingDealers.co.za This test is not yet approved or cleared  by the [place] FDA and has been authorized for detection and/or diagnosis of SARS-CoV-2 by FDA under an  Emergency Use Authorization (EUA).  This EUA will remain in effect (meaning this test can be used) for the duration of the COVID-19 declaration under Section 564(b)(1) of the Act, 21 U.S.C. section 360bbb-3(b)(1), unless the authorization is terminated or revoked sooner. Performed at [facility], [address] [place], [place] [address]          Radiology Studies: DG Ribs Unilateral Right  Result Date: 04/23/2020 CLINICAL DATA:  Pain without trauma EXAM: RIGHT RIBS - 2 VIEW COMPARISON:  04/21/2020 and previous FINDINGS: Sternotomy wires. No definite acute fracture. No definite pneumothorax. Postop changes in the anterior right hemithorax. Osteopenia. Retrievable IVC filter with leg fracture noted. 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 IMPRESSION: 1. No  fracture, pneumothorax, other acute finding. Electronically Signed   By: Lucrezia Europe M.D.   On: 04/23/2020 10:32   IR GASTR TUBE CONVERT GASTR-JEJ PER W/FL MOD SED  Result Date: 04/24/2020 INDICATION: Leaking balloon retention gastrostomy EXAM: FLUOROSCOPIC GASTROSTOMY EXCHANGE, UNSUCCESSFUL ATTEMPT AT CONVERTING TO A GJ TUBE. MEDICATIONS: NONE. ANESTHESIA/SEDATION: Versed 1.0 mg IV; Fentanyl 50 mcg IV Moderate Sedation Time:  25 minutes The patient was continuously monitored during the procedure by the interventional radiology nurse under my direct supervision. CONTRAST:  20 cc-administered into the gastric lumen. FLUOROSCOPY TIME:  Fluoroscopy Time: 4 minutes 6 seconds (25 mGy). COMPLICATIONS: None immediate. PROCEDURE: Informed written consent was obtained from the patient after a thorough discussion of the procedural risks, benefits and alternatives. All questions were addressed. Maximal Sterile Barrier Technique was utilized including caps, mask, sterile gowns, sterile gloves, sterile drape, hand hygiene and skin antiseptic. A timeout was  performed prior to the initiation of the procedure. The existing balloon retention 20 French gastrostomy was removed from the percutaneous tract. Attempts were made with a C2 catheter and a Kumpe catheter as well as guidewires to access the duodenum. After multiple attempts, this was unsuccessful. Therefore, a new 2 French balloon retention gastrostomy was readvanced through the percutaneous tract. Retention balloon was inflated with a total volume of 10 cc containing 1 cc contrast. This was retracted against anterior gastric wall  and secured externally. IMPRESSION: Unsuccessful attempt at converting to a gastrojejunostomy feeding tube. Fluoroscopic exchange for a 20 French balloon retention gastrostomy as above. If there is continued leakage, could consider conversion to a 22/24 [place] balloon retention feeding tube. Larger sizes have been ordered in case of this. Electronically Signed   By: Jerilynn Mages.  Shick M.D.   On: 04/24/2020 16:03        Scheduled Meds: . Chlorhexidine Gluconate Cloth  6 each Topical Daily  . enoxaparin (LOVENOX) injection  30 mg Subcutaneous Q24H  . feeding supplement (OSMOLITE 1.5 CAL)  1,000 mL Per Tube Q24H  . finasteride  5 mg Oral Daily  . free water  120 mL Per Tube BID  . free water  300 mL Per Tube BID  . ipratropium-albuterol  3 mL Nebulization Q6H  . lidocaine  1 patch Transdermal Q24H  . methylPREDNISolone (SOLU-MEDROL) injection  40 mg Intravenous Q12H  . thiamine  100 mg Per Tube Daily  . traZODone  50 mg Per Tube QHS   Continuous Infusions:   LOS: 4 days    Time spent: 32 mins     Wyvonnia Dusky, MD [facility] Pager [phone]  If 7PM-7AM, please contact night-coverage [email or web address] 04/25/2020, 7:11 AM

## 2020-04-25 NOTE — Plan of Care (Signed)
Patient PEG tube was leaking very minimally in the morning, per 3rd shift report.  However, throughout the day, It has showed signs of leaking more.  WOC RN and Dr. Informed - IR will look at ordering a larger tube tomorrow morning to possibly replace when able.
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
## 2020-04-26 LAB — BASIC METABOLIC PANEL
Anion gap: 7 (ref 5–15)
BUN: 22 mg/dL — ABNORMAL HIGH (ref 6–20)
CO2: 32 mmol/L (ref 22–32)
Calcium: 10 mg/dL (ref 8.9–10.3)
Chloride: 114 mmol/L — ABNORMAL HIGH (ref 98–111)
Creatinine, Ser: 0.52 mg/dL — ABNORMAL LOW (ref 0.61–1.24)
GFR calc Af Amer: >60 mL/min (ref >60)
GFR calc non Af Amer: >60 mL/min (ref >60)
Glucose, Bld: 78 mg/dL (ref 70–99)
Potassium: 3.7 mmol/L (ref 3.5–5.1)
Sodium: 153 mmol/L — ABNORMAL HIGH (ref 135–145)

## 2020-04-26 LAB — GLUCOSE, CAPILLARY
Glucose-Capillary: 113 mg/dL — ABNORMAL HIGH (ref 70–99)
Glucose-Capillary: 114 mg/dL — ABNORMAL HIGH (ref 70–99)
Glucose-Capillary: 47 mg/dL — ABNORMAL LOW (ref 70–99)
Glucose-Capillary: 49 mg/dL — ABNORMAL LOW (ref 70–99)
Glucose-Capillary: 53 mg/dL — ABNORMAL LOW (ref 70–99)
Glucose-Capillary: 66 mg/dL — ABNORMAL LOW (ref 70–99)
Glucose-Capillary: 72 mg/dL (ref 70–99)
Glucose-Capillary: 83 mg/dL (ref 70–99)
Glucose-Capillary: 86 mg/dL (ref 70–99)
Glucose-Capillary: 92 mg/dL (ref 70–99)

## 2020-04-26 LAB — CBC
HCT: 39.1 % (ref 39.0–52.0)
Hemoglobin: 12.2 g/dL — ABNORMAL LOW (ref 13.0–17.0)
MCH: 29.8 pg (ref 26.0–34.0)
MCHC: 31.2 g/dL (ref 30.0–36.0)
MCV: 95.4 fL (ref 80.0–100.0)
Platelets: 133 10*3/uL — ABNORMAL LOW (ref 150–400)
RBC: 4.1 MIL/uL — ABNORMAL LOW (ref 4.22–5.81)
RDW: 17.2 % — ABNORMAL HIGH (ref 11.5–15.5)
WBC: 6.3 10*3/uL (ref 4.0–10.5)
nRBC: 0 % (ref 0.0–0.2)

## 2020-04-26 LAB — CULTURE, BLOOD (ROUTINE X 2): Culture: NO GROWTH

## 2020-04-26 MED ORDER — DEXTROSE 5 % IV SOLN: INTRAVENOUS | Status: AC

## 2020-04-26 MED ORDER — MORPHINE SULFATE (PF) 2 MG/ML IV SOLN
2.0000 mg | INTRAVENOUS | Status: DC | PRN
  Administered 2020-04-26 – 2020-05-07 (×55): 2 mg via INTRAVENOUS
  Filled 2020-04-26 (×57): qty 1

## 2020-04-26 MED ORDER — DEXTROSE 10 % IV SOLN: INTRAVENOUS | Status: DC

## 2020-04-26 MED ORDER — RIVAROXABAN 10 MG PO TABS
10.0000 mg | ORAL_TABLET | Freq: Every day | ORAL | Status: DC
  Administered 2020-04-27 – 2020-04-28 (×2): 10 mg via ORAL
  Filled 2020-04-26 (×6): qty 1

## 2020-04-26 MED ORDER — DEXTROSE 50 % IV SOLN
INTRAVENOUS | Status: AC
  Administered 2020-04-26: 25 mL
  Filled 2020-04-26: qty 50

## 2020-04-26 MED ORDER — RIVAROXABAN 10 MG PO TABS
10.0000 mg | ORAL_TABLET | Freq: Every day | ORAL | Status: DC
  Filled 2020-04-26: qty 1

## 2020-04-26 MED ORDER — DEXTROSE 50 % IV SOLN
INTRAVENOUS | Status: AC
  Administered 2020-04-26: 50 mL
  Filled 2020-04-26: qty 50

## 2020-04-26 MED ORDER — DEXTROSE 50 % IV SOLN
1.0000 | Freq: Once | INTRAVENOUS | Status: AC
  Administered 2020-04-26: 25 mL via INTRAVENOUS

## 2020-04-26 MED ORDER — DEXTROSE 50 % IV SOLN
INTRAVENOUS | Status: AC
  Administered 2020-04-26: 25 mL via INTRAVENOUS
  Filled 2020-04-26: qty 50

## 2020-04-26 MED ORDER — DEXTROSE 50 % IV SOLN
25.0000 mL | Freq: Once | INTRAVENOUS | Status: AC
  Administered 2020-04-26: 25 mL via INTRAVENOUS

## 2020-04-26 NOTE — TOC Transition Note (Signed)
Transition of Care [facility]) - CM/SW Discharge Note   Patient Details  Name: Thomas [name] MRN: [id number] Date of Birth: [birth year]  Transition of Care [facility]) CM/SW Contact:  Elease Hashimoto, LCSW Phone Number: 04/26/2020, 8:37 AM   Clinical Narrative:   Pt medically ready to discharge home. Lives with parents who assist at times. [facility] will resume PT and RN follow up. No other needs. Pt hopes he does well and does not have to return to the hospital. He does feel weaker than he did but wants to go home    Final next level of care: [place] Barriers to Discharge: Barriers Resolved   Patient Goals and CMS Choice        Discharge Placement                Patient to be transferred to facility by: Mom via car home Name of family member notified: Mom Patient and family notified of of transfer: 04/26/20  Discharge Plan and Services   Discharge Planning Services: CM Consult Post Acute Care Choice: Resumption of Svcs/PTA Provider            DME Agency: [employer] Date DME Agency Contacted: 04/24/20 Time DME Agency Contacted: [phone] Representative spoke with at DME Agency: [place] HH Arranged: RN, PT [place] Agency: [place] ([place]) Date [place]: 04/24/20 Time [place]: [place] Representative spoke with at [place]: [place] ([place]) Interventions     Readmission Risk Interventions Readmission Risk Prevention Plan 04/05/2020 04/01/2020  Transportation Screening Complete Complete  PCP or Specialist Appt within 3-5 Days - Complete  HRI or [place] - Complete  Social Work Consult for [place] Planning/Counseling - Complete  Palliative Care Screening - Not Applicable  Medication Review Press photographer) Complete Complete  HRI or [place] Not Applicable -  Some recent data might be hidden

## 2020-04-26 NOTE — Progress Notes (Signed)
PROGRESS NOTE    Thomas [name]  [id number] DOB: 1971/03/31 DOA: 04/21/2020 PCP: [name], NP    Assessment & Plan:   Principal Problem:   Severe sepsis ([place]) Active Problems:   Hypernatremia   Protein-calorie malnutrition, severe   Dehydration with hypernatremia   Acute urinary retention   Complicated UTI (urinary tract infection)   Leaking percutaneous endoscopic gastrostomy (PEG) tube (HCC)   Gastrojejunostomy tube status (HCC)   AKI (acute kidney injury) ([place])   Hypotension   Flank pain, acute on chronic   Nephrolithiasis   Weakness   Acute bronchitis  Acute bronchitis and bronchospasm:  continue steroid taper and bronchodilators. Improving   Sepsis: ruled out.  Discontinue antibiotics since urine culture negative.   AKI:  Resolved  Chronic hypotension: blood pressure is normally low. Continue to monitor   G-tube malfunction: with tube feeds coming out of his ostomy site. Unable to place a GJ tube so exchange of the balloon tip G tube as per IR 04/24/20. Increased leakage yesterday afternoon and today. Still awaiting for the larger size G tube to be shipped here so IR can change it.  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  Acute urinary retention: continue on finasteride. Continue w/ foley catheter. Urology follow-up as an outpatient  Weakness:  PT recommends home health.  Severe protein calorie malnutrition: continue tube feedings when G tube working appropriately.  Chronic pain: in his right ribs, back, side, knees.  Morphine prn   Eye irritation: continue on stye ointment   Hypernatremia: free water deficit 0.8L. Started on D5W as tube feeds cannot be given as G-tube is not working appropriately   Thrombocytopenia: etiology unclear, mild. Will continue to monitor      DVT prophylaxis: lovenox  Code Status: full  Family Communication:  Disposition Plan: d/c home w/ home health. Unlikely any barriers.   Status is: Inpatient  Remains inpatient appropriate because: G-tube is not working and awaiting larger G tube to be shipped to hospital so that IR can change the G tube    Dispo: The patient is from: Home              Anticipated d/c is to: Home w/ home health               Anticipated d/c date is: >3 days              Patient currently is not medically stable to d/c.    Consultants:      Procedures:    Antimicrobials:   Subjective: Pt c/o fatigue  Objective: Vitals:   04/25/20 1324 04/25/20 2016 04/25/20 2019 04/26/20 0135  BP:  106/64  118/74  Pulse:  (!) 57  (!) 52  Resp:      Temp:    (!) 97.4 F (36.3 C)  TempSrc:      SpO2: 100% 93% 98% 99%  Weight:      Height:        Intake/Output Summary (Last 24 hours) at 04/26/2020 0722 Last data filed at 04/26/2020 0658 Gross per 24 hour  Intake 960.04 ml  Output 5925 ml  Net -4964.96 ml   Filed Weights   04/21/20 1532 04/23/20 0334 04/24/20 0424  Weight: 37.1 kg 35.5 kg 38 kg    Examination:  General exam: Appears calm and comfortable. Cachetic  Respiratory system: Clear to auscultation. No rhonchi, wheezes Cardiovascular system: S1 & S2+. No  rubs, gallops or clicks. No pedal edema. Gastrointestinal system: Abdomen is nondistended, soft and nontender. Hypoactive bowel sounds heard. Central nervous system: Alert and oriented. Moves all 4 extremities  Psychiatry: Judgement and insight appear normal. Flat mood and affect      Data Reviewed: I have personally reviewed following labs and imaging studies  CBC: Recent Labs  Lab 04/21/20 1616 04/22/20 1055 04/24/20 0626 04/25/20 0619 04/26/20 0402  WBC 8.5 6.8 5.3 6.7 6.3  NEUTROABS 5.9  --   --   --   --   HGB 14.1 13.8 11.9* 11.1* 12.2*  HCT 46.1 45.7 39.4 36.7* 39.1  MCV 94.7 94.8 96.8 96.3 95.4  PLT  353 140* 165 142* [id number]*   Basic Metabolic Panel: Recent Labs  Lab 04/21/20 1616 04/22/20 1055 04/24/20 0632 04/25/20 0619 04/26/20 0402  NA 150* 153* 149* 151* 153*  K 5.0 4.2 4.4 4.8 3.7  CL 105 116* 113* 114* 114*  CO2 26 28 31 31  32  GLUCOSE 76 102* 155* 126* 78  BUN 68* 46* 22* 26* 22*  CREATININE 1.69* 0.94 0.61 0.51* 0.52*  CALCIUM 10.6* 10.0 9.7 9.5 10.0   GFR: Estimated Creatinine Clearance: 60 mL/min (A) (by C-G formula based on SCr of 0.52 mg/dL (L)). Liver Function Tests: Recent Labs  Lab 04/21/20 1616 04/22/20 1055  AST 26 17  ALT 11 13  ALKPHOS 59 54  BILITOT 1.3* 0.9  PROT 7.6 7.0  ALBUMIN 4.7 4.4   No results for input(s): LIPASE, AMYLASE in the last 168 hours. No results for input(s): AMMONIA in the last 168 hours. Coagulation Profile: Recent Labs  Lab 04/22/20 1055  INR 1.0   Cardiac Enzymes: No results for input(s): CKTOTAL, CKMB, CKMBINDEX, TROPONINI in the last 168 hours. BNP (last 3 results) No results for input(s): PROBNP in the last 8760 hours. HbA1C: No results for input(s): HGBA1C in the last 72 hours. CBG: Recent Labs  Lab 04/25/20 2017 04/26/20 0029 04/26/20 0130 04/26/20 0142 04/26/20 0356  GLUCAP 77 66* 47* 114* 83   Lipid Profile: No results for input(s): CHOL, HDL, LDLCALC, TRIG, CHOLHDL, LDLDIRECT in the last 72 hours. Thyroid Function Tests: No results for input(s): TSH, T4TOTAL, FREET4, T3FREE, THYROIDAB in the last 72 hours. Anemia Panel: No results for input(s): VITAMINB12, FOLATE, FERRITIN, TIBC, IRON, RETICCTPCT in the last 72 hours. Sepsis Labs: Recent Labs  Lab 04/21/20 1701 04/22/20 1055  PROCALCITON  --  <0.10  LATICACIDVEN 1.9  --     Recent Results (from the past 240 hour(s))  Urine Culture     Status: None   Collection Time: 04/21/20  4:46 PM   Specimen: Urine, Random  Result Value Ref Range Status   Specimen Description   Final    URINE, RANDOM Performed at [facility], [address], [place], [place] [address]    Special Requests   Final    NONE Performed at [facility] [address] [place], [place] [address]    Culture   Final    NO GROWTH Performed at [facility], [place] [address] [place], [place] [address]    Report Status 04/22/2020 FINAL  Final  Blood culture (routine x 2)     Status: None (Preliminary result)   Collection Time: 04/21/20  5:02 PM   Specimen: BLOOD  Result Value Ref Range  Status   Specimen Description BLOOD BLOOD RIGHT HAND  Final   Special Requests   Final    BOTTLES DRAWN AEROBIC AND ANAEROBIC Blood Culture results may not be optimal due to an inadequate volume of blood received in culture bottles   Culture   Final    NO GROWTH 4 DAYS Performed at [facility], [address], [place], [place] [address]    Report Status PENDING  Incomplete  SARS Coronavirus 2 by RT PCR (hospital order, performed in [facility] hospital lab) Nasopharyngeal Nasopharyngeal Swab     Status: None   Collection Time: 04/21/20  7:22 PM   Specimen: Nasopharyngeal Swab  Result Value Ref Range Status   SARS Coronavirus 2 NEGATIVE NEGATIVE Final    Comment: (NOTE) SARS-CoV-2 target nucleic acids are NOT DETECTED. The SARS-CoV-2 RNA is generally detectable in upper and lower respiratory specimens during the acute phase of infection. The lowest concentration of SARS-CoV-2 viral copies this assay can detect is 250 copies / mL. A negative result does not preclude SARS-CoV-2 infection and should not be used as the sole basis for treatment or other patient management decisions.  A negative result may occur with improper specimen collection / handling, submission of specimen other than nasopharyngeal swab, presence of viral mutation(s) within the areas targeted by this assay, and inadequate number of viral copies (<250 copies / mL). A negative result must be combined with clinical observations, patient history, and epidemiological  information. Fact Sheet for Patients:   StrictlyIdeas.no Fact Sheet for Healthcare Providers: BankingDealers.co.za This test is not yet approved or cleared  by the [place] FDA and has been authorized for detection and/or diagnosis of SARS-CoV-2 by FDA under an Emergency Use Authorization (EUA).  This EUA will remain in effect (meaning this test can be used) for the duration of the COVID-19 declaration under Section 564(b)(1) of the Act, 21 U.S.C. section 360bbb-3(b)(1), unless the authorization is terminated or revoked sooner. Performed at [facility], [place] [address] [place], [place] [address]          Radiology Studies: IR GASTR TUBE CONVERT GASTR-JEJ PER W/FL MOD SED  Result Date: 04/24/2020 INDICATION: Leaking balloon retention gastrostomy EXAM: FLUOROSCOPIC GASTROSTOMY EXCHANGE, UNSUCCESSFUL ATTEMPT AT CONVERTING TO A GJ TUBE. MEDICATIONS: NONE. ANESTHESIA/SEDATION: Versed 1.0 mg IV; Fentanyl 50 mcg IV Moderate Sedation Time:  25 minutes The patient was continuously monitored during the procedure by the interventional radiology nurse under my direct supervision. CONTRAST:  20 cc-administered into the gastric lumen. FLUOROSCOPY TIME:  Fluoroscopy Time: 4 minutes 6 seconds (25 mGy). COMPLICATIONS: None immediate. PROCEDURE: Informed written consent was obtained from the patient after a thorough discussion of the procedural risks, benefits and alternatives. All questions were addressed. Maximal Sterile Barrier Technique was utilized including caps, mask, sterile gowns, sterile gloves, sterile drape, hand hygiene and skin antiseptic. A timeout was performed prior to the initiation of the procedure. The existing balloon retention 20 French gastrostomy was removed from the percutaneous tract. Attempts were made with a C2 catheter and a Kumpe catheter as well as guidewires to access the duodenum. After multiple attempts, this was  unsuccessful. Therefore, a new 30 French balloon retention gastrostomy was readvanced through the percutaneous tract. Retention balloon was inflated with a total volume of 10 cc containing 1 cc contrast. This was retracted against anterior gastric wall and secured externally. IMPRESSION: Unsuccessful attempt at converting to a gastrojejunostomy feeding tube. Fluoroscopic exchange for a 20 French balloon retention gastrostomy as above. If  there is continued leakage, could consider conversion to a 22/24 [place] balloon retention feeding tube. Larger sizes have been ordered in case of this. Electronically Signed   By: Jerilynn Mages.  Shick M.D.   On: 04/24/2020 16:03        Scheduled Meds: . Chlorhexidine Gluconate Cloth  6 each Topical Daily  . feeding supplement (OSMOLITE 1.5 CAL)  1,000 mL Per Tube Q24H  . finasteride  5 mg Oral Daily  . free water  120 mL Per Tube BID  . free water  300 mL Per Tube BID  . ipratropium-albuterol  3 mL Nebulization Q6H  . lidocaine  1 patch Transdermal Q24H  . methylPREDNISolone (SOLU-MEDROL) injection  40 mg Intravenous Q24H  . rivaroxaban  10 mg Oral Q supper  . thiamine  100 mg Per Tube Daily  . traZODone  50 mg Per Tube QHS   Continuous Infusions: . dextrose 30 mL/hr at 04/26/20 0359     LOS: 5 days    Time spent: 35 mins     Wyvonnia Dusky, MD [facility] Pager [phone]  If 7PM-7AM, please contact night-coverage [email or web address] 04/26/2020, 7:22 AM

## 2020-04-26 NOTE — Progress Notes (Addendum)
Request to IR for upsize of gastrostomy due to leaking - this has been approved by IR attending however we do not have any 22 or 24 Fr gastrostomy tubes in stock. The tubes were ordered previously but have not yet arrived.  Will plan for upsize once g-tube stock is delivered, likely next week. If patient is to be discharged this can be done on an outpatient basis, we would just an order from primary team.   Please call IR with questions or concerns.  Candiss Norse, PA-C  ADDENDUM [phone]:  Presented to patient's room to discuss g-tube replacement, explained that there is a Producer, television/film/video of g-tubes and we do not know a specific day that they will arrive for his upsize. Offered outpatient appointment for procedure so patient could be discharged home to wait for g-tubes to arrive however he firmly declined several times stating that as soon as he goes home he gets worse and "where I come from they don't make you leave the hospital until you're better, so I will stay." He also requests contact information for IR, pudding and chocolate milk with all meals and to not have his room changed as his previous room was very cold -- informed patient I would let primary team know of his wishes and provided contact number for IR.   Discussed with attending physician, Dr. Jimmye Norman, who plans to keep patient until g-tubes arrive. We will continue to follow and plan for upsize once shipment has arrived.

## 2020-04-26 NOTE — Progress Notes (Signed)
Pt glucose 47; pt asymptomatic, sitting in bed, conversing, watching tv. Amp dextrose given; repeat glucose 114; Sharion Settler, NP aware of above, IV dextrose per orders. Pt remains NPO for IR procedure today.

## 2020-04-27 LAB — CBC
HCT: 42.3 % (ref 39.0–52.0)
Hemoglobin: 12.7 g/dL — ABNORMAL LOW (ref 13.0–17.0)
MCH: 29.2 pg (ref 26.0–34.0)
MCHC: 30 g/dL (ref 30.0–36.0)
MCV: 97.2 fL (ref 80.0–100.0)
Platelets: 141 10*3/uL — ABNORMAL LOW (ref 150–400)
RBC: 4.35 MIL/uL (ref 4.22–5.81)
RDW: 16.8 % — ABNORMAL HIGH (ref 11.5–15.5)
WBC: 6.1 10*3/uL (ref 4.0–10.5)
nRBC: 0 % (ref 0.0–0.2)

## 2020-04-27 LAB — BASIC METABOLIC PANEL
Anion gap: 7 (ref 5–15)
BUN: 20 mg/dL (ref 6–20)
CO2: 33 mmol/L — ABNORMAL HIGH (ref 22–32)
Calcium: 9.8 mg/dL (ref 8.9–10.3)
Chloride: 108 mmol/L (ref 98–111)
Creatinine, Ser: 0.53 mg/dL — ABNORMAL LOW (ref 0.61–1.24)
GFR calc Af Amer: >60 mL/min (ref >60)
GFR calc non Af Amer: >60 mL/min (ref >60)
Glucose, Bld: 82 mg/dL (ref 70–99)
Potassium: 3.7 mmol/L (ref 3.5–5.1)
Sodium: 148 mmol/L — ABNORMAL HIGH (ref 135–145)

## 2020-04-27 LAB — GLUCOSE, CAPILLARY
Glucose-Capillary: 103 mg/dL — ABNORMAL HIGH (ref 70–99)
Glucose-Capillary: 120 mg/dL — ABNORMAL HIGH (ref 70–99)
Glucose-Capillary: 72 mg/dL (ref 70–99)
Glucose-Capillary: 83 mg/dL (ref 70–99)
Glucose-Capillary: 88 mg/dL (ref 70–99)
Glucose-Capillary: 98 mg/dL (ref 70–99)

## 2020-04-27 NOTE — Plan of Care (Signed)
  Problem: Clinical Measurements: Goal: Ability to maintain clinical measurements within normal limits will improve Outcome: Progressing Goal: Will remain free from infection Outcome: Progressing Goal: Diagnostic test results will improve Outcome: Progressing Goal: Respiratory complications will improve Outcome: Progressing Goal: Cardiovascular complication will be avoided Outcome: Progressing   Problem: Activity: Goal: Risk for activity intolerance will decrease Outcome: Progressing   Problem: Coping: Goal: Level of anxiety will decrease Outcome: Progressing   Problem: Pain Managment: Goal: General experience of comfort will improve Outcome: Progressing   
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
## 2020-04-28 LAB — CBC
HCT: 39.5 % (ref 39.0–52.0)
Hemoglobin: 12.2 g/dL — ABNORMAL LOW (ref 13.0–17.0)
MCH: 29 pg (ref 26.0–34.0)
MCHC: 30.9 g/dL (ref 30.0–36.0)
MCV: 93.8 fL (ref 80.0–100.0)
Platelets: 145 10*3/uL — ABNORMAL LOW (ref 150–400)
RBC: 4.21 MIL/uL — ABNORMAL LOW (ref 4.22–5.81)
RDW: 16.3 % — ABNORMAL HIGH (ref 11.5–15.5)
WBC: 6.3 10*3/uL (ref 4.0–10.5)
nRBC: 0 % (ref 0.0–0.2)

## 2020-04-28 LAB — URINALYSIS, ROUTINE W REFLEX MICROSCOPIC
RBC / HPF: >50 RBC/hpf — ABNORMAL HIGH (ref 0–5)
Specific Gravity, Urine: 1.017 (ref 1.005–1.030)

## 2020-04-28 LAB — BASIC METABOLIC PANEL
Anion gap: 6 (ref 5–15)
BUN: 19 mg/dL (ref 6–20)
CO2: 30 mmol/L (ref 22–32)
Calcium: 9.7 mg/dL (ref 8.9–10.3)
Chloride: 106 mmol/L (ref 98–111)
Creatinine, Ser: 0.66 mg/dL (ref 0.61–1.24)
GFR calc Af Amer: >60 mL/min (ref >60)
GFR calc non Af Amer: >60 mL/min (ref >60)
Glucose, Bld: 161 mg/dL — ABNORMAL HIGH (ref 70–99)
Potassium: 3.2 mmol/L — ABNORMAL LOW (ref 3.5–5.1)
Sodium: 142 mmol/L (ref 135–145)

## 2020-04-28 LAB — GLUCOSE, CAPILLARY
Glucose-Capillary: 130 mg/dL — ABNORMAL HIGH (ref 70–99)
Glucose-Capillary: 101 mg/dL — ABNORMAL HIGH (ref 70–99)
Glucose-Capillary: 90 mg/dL (ref 70–99)
Glucose-Capillary: 100 mg/dL — ABNORMAL HIGH (ref 70–99)

## 2020-04-28 MED ORDER — POTASSIUM CHLORIDE 10 MEQ/100ML IV SOLN
10.0000 meq | INTRAVENOUS | Status: AC
  Administered 2020-04-28 (×4): 10 meq via INTRAVENOUS
  Filled 2020-04-28 (×3): qty 100

## 2020-04-28 MED ORDER — METHYLPREDNISOLONE SODIUM SUCC 40 MG IJ SOLR
20.0000 mg | Freq: Every day | INTRAMUSCULAR | Status: DC
  Administered 2020-04-28 – 2020-04-30 (×3): 20 mg via INTRAVENOUS
  Filled 2020-04-28 (×3): qty 1

## 2020-04-28 NOTE — Progress Notes (Signed)
PROGRESS NOTE    [name] Thomas  [id number] DOB: 14-Nov-1971 DOA: 04/21/2020 PCP: [name], NP    Assessment & Plan:   Principal Problem:   Severe sepsis ([place]) Active Problems:   Hypernatremia   Protein-calorie malnutrition, severe   Dehydration with hypernatremia   Acute urinary retention   Complicated UTI (urinary tract infection)   Leaking percutaneous endoscopic gastrostomy (PEG) tube (HCC)   Gastrojejunostomy tube status (HCC)   AKI (acute kidney injury) ([place])   Hypotension   Flank pain, acute on chronic   Nephrolithiasis   Weakness   Acute bronchitis  Acute bronchitis and bronchospasm:  continue steroid taper and bronchodilators. Improving   Sepsis: ruled out.  Discontinue antibiotics since urine culture negative.   AKI:  Resolved  Chronic hypotension: blood pressure is normally low. Continue to monitor   G-tube malfunction: with tube feeds coming out of his ostomy site. Unable to place a GJ tube so exchange of the balloon tip G tube as per IR 04/24/20. Increased leakage despite exchange of the balloon tip. Still awaiting for the larger size G tube to be shipped to hospital so IR can change it.  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 Liquids come out of an ostomy on chest wall. NO TUBE FEEDS UNTIL G-TUBE IS FIXED   Hypokalemia: KCl repleted again. Will continue to monitor   Abnormal imaging of IVC filter:  because the chronicity of the filter placed removing it may cause more issues than just leaving it in.  Acute urinary retention: continue on finasteride. Continue w/ foley catheter. Urology follow-up as an outpatient  Hematuria: etiology unclear, UTI vs trauma from foley. Hold home dose of xarelto. Will check a UA  Weakness:  PT recommends home health.  Severe protein calorie malnutrition: HOLD tube feedings when G tube working appropriately as pt is unable to absorb tube feeds w/ malfunctioning tube  feeds   Chronic pain: in his right ribs, back, side, knees.  Morphine prn   Eye irritation: continue on stye ointment   Hypernatremia: resolved  Thrombocytopenia: etiology unclear, mild. Will continue to monitor   History of polytrauma: from [facility] 2011. 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 Unlikely any barriers.   Status is: Inpatient  Remains inpatient appropriate because: G-tube is not working and awaiting larger G tube to be shipped to hospital so that IR can change the G tube    Dispo: The patient is from: Home              Anticipated d/c is to: Home w/ home health               Anticipated d/c date is: >3 days              Patient currently is not medically stable to d/c.    Consultants:      Procedures:    Antimicrobials:   Subjective: Pt c/o back pain still  Objective: Vitals:   04/27/20 1410 04/27/20 2028 04/27/20 2350 04/28/20 0500  BP:   (!) 106/59   Pulse:      Resp:   16   Temp:   98.2 F (36.8 C)   TempSrc:   Oral   SpO2: 91% 94%  Weight:    46.5 kg  Height:        Intake/Output Summary (Last 24 hours) at 04/28/2020 0714 Last data filed at 04/28/2020 0507 Gross per 24 hour  Intake 1041.26 ml  Output 3350 ml  Net -2308.74 ml   Filed Weights   04/23/20 0334 04/24/20 0424 04/28/20 0500  Weight: 35.5 kg 38 kg 46.5 kg    Examination:  General exam: Appears calm and comfortable. Cachetic  Respiratory system: Clear to auscultation. No wheezes Cardiovascular system: S1 & S2+. No  rubs, gallops or clicks. No pedal edema. Gastrointestinal system: Abdomen is nondistended, soft and nontender. Hypoactive bowel sounds heard. Central nervous system: Alert and oriented. Moves all 4 extremities    Psychiatry: Judgement and insight appear normal. Normal mood and affect       Data Reviewed: I have personally reviewed following labs and imaging studies  CBC: Recent Labs  Lab 04/21/20 1616 04/22/20 1055 04/24/20 0626 04/25/20 0619 04/26/20 0402 04/27/20 0503 04/28/20 0621  WBC 8.5   < > 5.3 6.7 6.3 6.1 6.3  NEUTROABS 5.9  --   --   --   --   --   --   HGB 14.1   < > 11.9* 11.1* 12.2* 12.7* 12.2*  HCT 46.1   < > 39.4 36.7* 39.1 42.3 39.5  MCV 94.7   < > 96.8 96.3 95.4 97.2 93.8  PLT 353   < > 165 142* 133* 141* 145*   < > = values in this interval not displayed.   Basic Metabolic Panel: Recent Labs  Lab 04/24/20 0632 04/25/20 0619 04/26/20 0402 04/27/20 0503 04/28/20 0621  NA 149* 151* 153* 148* 142  K 4.4 4.8 3.7 3.7 3.2*  CL 113* 114* 114* 108 106  CO2 31 31 32 33* 30  GLUCOSE 155* 126* 78 82 161*  BUN 22* 26* 22* 20 19  CREATININE 0.61 0.51* 0.52* 0.53* 0.66  CALCIUM 9.7 9.5 10.0 9.8 9.7   GFR: Estimated Creatinine Clearance: 73.5 mL/min (by C-G formula based on SCr of 0.66 mg/dL). Liver Function Tests: Recent Labs  Lab 04/21/20 1616 04/22/20 1055  AST 26 17  ALT 11 13  ALKPHOS 59 54  BILITOT 1.3* 0.9  PROT 7.6 7.0  ALBUMIN 4.7 4.4   No results for input(s): LIPASE, AMYLASE in the last 168 hours. No results for input(s): AMMONIA in the last 168 hours. Coagulation Profile: Recent Labs  Lab 04/22/20 1055  INR 1.0   Cardiac Enzymes: No results for input(s): CKTOTAL, CKMB, CKMBINDEX, TROPONINI in the last 168 hours. BNP (last 3 results) No results for input(s): PROBNP in the last 8760 hours. HbA1C: No results for input(s): HGBA1C in the last 72 hours. CBG: Recent Labs  Lab 04/27/20 0747 04/27/20 1156 04/27/20 2032 04/27/20 2351 04/28/20 0415  GLUCAP 83 72 98 103* 130*   Lipid Profile: No results for input(s): CHOL, HDL, LDLCALC, TRIG, CHOLHDL, LDLDIRECT in the last 72 hours. Thyroid Function Tests: No results for input(s): TSH,  T4TOTAL, FREET4, T3FREE, THYROIDAB in the last 72 hours. Anemia Panel: No results for input(s): VITAMINB12, FOLATE, FERRITIN, TIBC, IRON, RETICCTPCT in the last 72 hours. Sepsis Labs: Recent Labs  Lab 04/21/20 1701 04/22/20 1055  PROCALCITON  --  <0.10  LATICACIDVEN 1.9  --     Recent Results (from the past 240 hour(s))  Urine Culture     Status: None   Collection Time: 04/21/20  4:46 PM   Specimen: Urine, Random  Result Value Ref  Range Status   Specimen Description   Final    URINE, RANDOM Performed at [facility], [address] [place], [place] [address]    Special Requests   Final    NONE Performed at [facility], [address], [place], [place] [address]    Culture   Final    NO GROWTH Performed at [facility], [place] [address], [place], [place] [address]    Report Status 04/22/2020 FINAL  Final  Blood culture (routine x 2)     Status: None   Collection Time: 04/21/20  5:02 PM   Specimen: BLOOD  Result Value Ref Range Status   Specimen Description BLOOD BLOOD RIGHT HAND  Final   Special Requests   Final    BOTTLES DRAWN AEROBIC AND ANAEROBIC Blood Culture results may not be optimal due to an inadequate volume of blood received in culture bottles   Culture   Final    NO GROWTH 5 DAYS Performed at [facility], [address], [place], [place] [address]    Report Status 04/26/2020 FINAL  Final  SARS Coronavirus 2 by RT PCR (hospital order, performed in [facility] hospital lab) Nasopharyngeal Nasopharyngeal Swab     Status: None   Collection Time: 04/21/20  7:22 PM   Specimen: Nasopharyngeal Swab  Result Value Ref Range Status   SARS Coronavirus 2 NEGATIVE NEGATIVE Final    Comment: (NOTE) SARS-CoV-2 target nucleic acids are NOT DETECTED. The SARS-CoV-2 RNA is generally detectable in upper and lower respiratory specimens during the acute phase of infection. The lowest concentration of SARS-CoV-2 viral copies this assay can  detect is 250 copies / mL. A negative result does not preclude SARS-CoV-2 infection and should not be used as the sole basis for treatment or other patient management decisions.  A negative result may occur with improper specimen collection / handling, submission of specimen other than nasopharyngeal swab, presence of viral mutation(s) within the areas targeted by this assay, and inadequate number of viral copies (<250 copies / mL). A negative result must be combined with clinical observations, patient history, and epidemiological information. Fact Sheet for Patients:   StrictlyIdeas.no Fact Sheet for Healthcare Providers: BankingDealers.co.za This test is not yet approved or cleared  by the [place] FDA and has been authorized for detection and/or diagnosis of SARS-CoV-2 by FDA under an Emergency Use Authorization (EUA).  This EUA will remain in effect (meaning this test can be used) for the duration of the COVID-19 declaration under Section 564(b)(1) of the Act, 21 U.S.C. section 360bbb-3(b)(1), unless the authorization is terminated or revoked sooner. Performed at [facility], [address], [place], [place] [address]          Radiology Studies: No results found.      Scheduled Meds: . Chlorhexidine Gluconate Cloth  6 each Topical Daily  . feeding supplement (OSMOLITE 1.5 CAL)  1,000 mL Per Tube Q24H  . finasteride  5 mg Oral Daily  . free water  120 mL Per Tube BID  . free water  300 mL Per Tube BID  . ipratropium-albuterol  3 mL Nebulization Q6H  . lidocaine  1 patch Transdermal Q24H  . methylPREDNISolone (SOLU-MEDROL) injection  40 mg Intravenous Q24H  . rivaroxaban  10 mg Oral Q supper  . thiamine  100 mg Per Tube Daily  . traZODone  50 mg Per Tube QHS   Continuous Infusions: . dextrose 75 mL/hr at 04/27/20 1807  LOS: 7 days    Time spent: 32 mins     Wyvonnia Dusky, MD [facility] Pager [phone]  If 7PM-7AM, please contact night-coverage [email or web address] 04/28/2020, 7:14 AM

## 2020-04-29 LAB — BASIC METABOLIC PANEL
Anion gap: 5 (ref 5–15)
BUN: 13 mg/dL (ref 6–20)
CO2: 29 mmol/L (ref 22–32)
Calcium: 9.1 mg/dL (ref 8.9–10.3)
Chloride: 106 mmol/L (ref 98–111)
Creatinine, Ser: 0.6 mg/dL — ABNORMAL LOW (ref 0.61–1.24)
GFR calc Af Amer: >60 mL/min (ref >60)
GFR calc non Af Amer: >60 mL/min (ref >60)
Glucose, Bld: 90 mg/dL (ref 70–99)
Potassium: 3.4 mmol/L — ABNORMAL LOW (ref 3.5–5.1)
Sodium: 140 mmol/L (ref 135–145)

## 2020-04-29 LAB — GLUCOSE, CAPILLARY
Glucose-Capillary: 102 mg/dL — ABNORMAL HIGH (ref 70–99)
Glucose-Capillary: 75 mg/dL (ref 70–99)
Glucose-Capillary: 82 mg/dL (ref 70–99)
Glucose-Capillary: 83 mg/dL (ref 70–99)
Glucose-Capillary: 87 mg/dL (ref 70–99)
Glucose-Capillary: 89 mg/dL (ref 70–99)
Glucose-Capillary: 92 mg/dL (ref 70–99)

## 2020-04-29 LAB — CBC
HCT: 35.8 % — ABNORMAL LOW (ref 39.0–52.0)
Hemoglobin: 11.3 g/dL — ABNORMAL LOW (ref 13.0–17.0)
MCH: 29.4 pg (ref 26.0–34.0)
MCHC: 31.6 g/dL (ref 30.0–36.0)
MCV: 93.2 fL (ref 80.0–100.0)
Platelets: 138 10*3/uL — ABNORMAL LOW (ref 150–400)
RBC: 3.84 MIL/uL — ABNORMAL LOW (ref 4.22–5.81)
RDW: 16.2 % — ABNORMAL HIGH (ref 11.5–15.5)
WBC: 6.6 10*3/uL (ref 4.0–10.5)
nRBC: 0 % (ref 0.0–0.2)

## 2020-04-29 NOTE — Progress Notes (Addendum)
PROGRESS NOTE    Thomas [name]  [id number] DOB: 02/13/1971 DOA: 04/21/2020 PCP: [name], NP    Assessment & Plan:   Principal Problem:   Severe sepsis [facility]) Active Problems:   Hypernatremia   Protein-calorie malnutrition, severe   Dehydration with hypernatremia   Acute urinary retention   Complicated UTI (urinary tract infection)   Leaking percutaneous endoscopic gastrostomy (PEG) tube (HCC)   Gastrojejunostomy tube status (HCC)   AKI (acute kidney injury) ([place])   Hypotension   Flank pain, acute on chronic   Nephrolithiasis   Weakness   Acute bronchitis  G-tube malfunction: with tube feeds coming out of his ostomy site. Unable to place a GJ tube so exchange of the balloon tip G tube as per IR 04/24/20. Increased leakage despite exchange of the balloon tip. Still awaiting for the larger size G tube to be shipped to hospital so IR can change it.  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 Liquids come out of an ostomy on chest wall. Clear liquids only. Have discussed w/ pt playing it safe in terms of the liquids that the pt can have as there are different reports from as to which liquid clogged the tube. NO TUBE FEEDS UNTIL G-TUBE IS FIXED   Hematuria: etiology unclear, UTI vs trauma from foley. Hold home dose of xarelto. UA is non-dx secondary urine pigment color. Urine cx ordered. No fevers, normal WBC. Will continue H&H  Acute bronchitis and bronchospasm: continue steroid taper and bronchodilators. Improving   Sepsis: ruled out. Discontinue antibiotics since urine culture negative.   AKI:  Resolved  Chronic hypotension: blood pressure is normally low. Continue to monitor   Hypokalemia: KCl repleted. Will continue to monitor   Abnormal imaging of IVC filter:  because the chronicity of the filter placed removing it may cause more issues than just leaving it in.  Acute urinary retention: continue on finasteride.  Continue w/ foley catheter. Urology follow-up as an outpatient  Weakness:  PT recommends home health.  Severe protein calorie malnutrition: HOLD tube feedings until G tube working appropriately as pt is unable to absorb tube feeds w/ malfunctioning G tube  Hypoglycemia: labile. Secondary to G-tube malfunction. Continue on D5W  Chronic pain: in his right ribs, back, side, knees.  Morphine prn   Eye irritation: continue on stye ointment   Hypernatremia: resolved  Thrombocytopenia: etiology unclear, mild. Will continue to monitor   History of polytrauma: from [facility] 2011. 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 Unlikely any barriers.   Status is: Inpatient  Remains inpatient appropriate because: G-tube is not working and awaiting larger G tube to be shipped to hospital so that IR can change the G tube    Dispo: The patient is from: Home              Anticipated d/c is to: Home w/ home health               Anticipated d/c date is: >3 days              Patient currently is not medically stable to d/c.    Consultants:      Procedures:    Antimicrobials:   Subjective: Pt c/o being thirsty  Objective: Vitals:   04/28/20 0737 04/28/20 1549 04/28/20 1550 04/29/20 0008  BP: 112/71  115/69 103/69  Pulse: 61  67 70  Resp: 18  16 19   Temp: 98 F (36.7 C)  98.4 F (36.9 C) 98.4 F (36.9 C)  TempSrc: Oral  Oral Oral  SpO2: 100% 100% 100% 97%  Weight:      Height:        Intake/Output Summary (Last 24 hours) at 04/29/2020 0711 Last data filed at 04/28/2020 1809 Gross per 24 hour  Intake 2325.44 ml  Output 250 ml  Net 2075.44 ml   Filed Weights   04/23/20 0334 04/24/20 0424 04/28/20 0500  Weight: 35.5 kg 38 kg 46.5 kg     Examination:  General exam: Appears calm and comfortable. Cachetic  Respiratory system: Clear to auscultation. No rales. No accessory muscle use Cardiovascular system: S1 & S2+. No  rubs, gallops or clicks. No pedal edema. Gastrointestinal system: Abdomen is nondistended, soft and nontender. Hypoactive bowel sounds heard. Central nervous system: Alert and oriented. Moves all 4 extremities  Psychiatry: Judgement and insight appear normal. Flat mood and affect    Data Reviewed: I have personally reviewed following labs and imaging studies  CBC: Recent Labs  Lab 04/25/20 0619 04/26/20 0402 04/27/20 0503 04/28/20 0621 04/29/20 0556  WBC 6.7 6.3 6.1 6.3 6.6  HGB 11.1* 12.2* 12.7* 12.2* 11.3*  HCT 36.7* 39.1 42.3 39.5 35.8*  MCV 96.3 95.4 97.2 93.8 93.2  PLT 142* 133* 141* 145* 0000000*   Basic Metabolic Panel: Recent Labs  Lab 04/25/20 0619 04/26/20 0402 04/27/20 0503 04/28/20 0621 04/29/20 0556  NA 151* 153* 148* 142 140  K 4.8 3.7 3.7 3.2* 3.4*  CL 114* 114* 108 106 106  CO2 31 32 33* 30 29  GLUCOSE 126* 78 82 161* 90  BUN 26* 22* 20 19 13   CREATININE 0.51* 0.52* 0.53* 0.66 0.60*  CALCIUM 9.5 10.0 9.8 9.7 9.1   GFR: Estimated Creatinine Clearance: 73.5 mL/min (A) (by C-G formula based on SCr of 0.6 mg/dL (L)). Liver Function Tests: Recent Labs  Lab 04/22/20 1055  AST 17  ALT 13  ALKPHOS 54  BILITOT 0.9  PROT 7.0  ALBUMIN 4.4   No results for input(s): LIPASE, AMYLASE in the last 168 hours. No results for input(s): AMMONIA in the last 168 hours. Coagulation Profile: Recent Labs  Lab 04/22/20 1055  INR 1.0   Cardiac Enzymes: No results for input(s): CKTOTAL, CKMB, CKMBINDEX, TROPONINI in the last 168 hours. BNP (last 3 results) No results for input(s): PROBNP in the last 8760 hours. HbA1C: No results for input(s): HGBA1C in the last 72 hours. CBG: Recent Labs  Lab 04/28/20 0415 04/28/20 0740 04/28/20 1159 04/28/20 1629 04/29/20 0007  GLUCAP  130* 101* 90 100* 92   Lipid Profile: No results for input(s): CHOL, HDL, LDLCALC, TRIG, CHOLHDL, LDLDIRECT in the last 72 hours. Thyroid Function Tests: No results for input(s): TSH, T4TOTAL, FREET4, T3FREE, THYROIDAB in the last 72 hours. Anemia Panel: No results for input(s): VITAMINB12, FOLATE, FERRITIN, TIBC, IRON, RETICCTPCT in the last 72 hours. Sepsis Labs: Recent Labs  Lab 04/22/20 1055  PROCALCITON <0.10    Recent Results (from the past 240 hour(s))  Urine Culture     Status: None   Collection Time: 04/21/20  4:46 PM   Specimen: Urine, Random  Result Value Ref Range Status   Specimen Description   Final    URINE, RANDOM Performed at [facility], [place]  [address] [place], [place] [address]    Special Requests   Final    NONE Performed at [facility], [address] [place], [place] [address]    Culture   Final    NO GROWTH Performed at [facility], [place] [address], [place], [place] [address]    Report Status 04/22/2020 FINAL  Final  Blood culture (routine x 2)     Status: None   Collection Time: 04/21/20  5:02 PM   Specimen: BLOOD  Result Value Ref Range Status   Specimen Description BLOOD BLOOD RIGHT HAND  Final   Special Requests   Final    BOTTLES DRAWN AEROBIC AND ANAEROBIC Blood Culture results may not be optimal due to an inadequate volume of blood received in culture bottles   Culture   Final    NO GROWTH 5 DAYS Performed at [facility], [address], [place], [place] [address]    Report Status 04/26/2020 FINAL  Final  SARS Coronavirus 2 by RT PCR (hospital order, performed in [facility] hospital lab) Nasopharyngeal Nasopharyngeal Swab     Status: None   Collection Time: 04/21/20  7:22 PM   Specimen: Nasopharyngeal Swab  Result Value Ref Range Status   SARS Coronavirus 2 NEGATIVE NEGATIVE Final    Comment: (NOTE) SARS-CoV-2 target nucleic acids are NOT DETECTED. The SARS-CoV-2 RNA is generally detectable in  upper and lower respiratory specimens during the acute phase of infection. The lowest concentration of SARS-CoV-2 viral copies this assay can detect is 250 copies / mL. A negative result does not preclude SARS-CoV-2 infection and should not be used as the sole basis for treatment or other patient management decisions.  A negative result may occur with improper specimen collection / handling, submission of specimen other than nasopharyngeal swab, presence of viral mutation(s) within the areas targeted by this assay, and inadequate number of viral copies (<250 copies / mL). A negative result must be combined with clinical observations, patient history, and epidemiological information. Fact Sheet for Patients:   StrictlyIdeas.no Fact Sheet for Healthcare Providers: BankingDealers.co.za This test is not yet approved or cleared  by the [place] FDA and has been authorized for detection and/or diagnosis of SARS-CoV-2 by FDA under an Emergency Use Authorization (EUA).  This EUA will remain in effect (meaning this test can be used) for the duration of the COVID-19 declaration under Section 564(b)(1) of the Act, 21 U.S.C. section 360bbb-3(b)(1), unless the authorization is terminated or revoked sooner. Performed at [facility], [address] [place], [place] [address]          Radiology Studies: No results found.      Scheduled Meds: . Chlorhexidine Gluconate Cloth  6 each Topical Daily  . finasteride  5 mg Oral Daily  . ipratropium-albuterol  3 mL Nebulization Q6H  . lidocaine  1 patch Transdermal Q24H  . methylPREDNISolone (SOLU-MEDROL) injection  20 mg Intravenous Daily  . rivaroxaban  10 mg Oral Q supper  . thiamine  100 mg Per Tube Daily  . traZODone  50 mg Per Tube QHS   Continuous Infusions: . dextrose 75 mL/hr at 04/28/20 2040     LOS: 8 days    Time spent: 30 mins     Wyvonnia Dusky,  MD [facility] Pager [phone]  If 7PM-7AM, please contact night-coverage [email or web address] 04/29/2020, 7:11 AM

## 2020-04-29 NOTE — Progress Notes (Signed)
Ch and Ch.Justin Mend visited with Pt as part of regular rounding. Pt seems dissatisfied with stay; had complaints about bag not staying on him. Pt wanted temperature of room to be higher, and TV volume not going up to his liking. Pt also asked for a pitcher of ice-chips. Pt asked for a counselor to talk to about mental concerns. Chs let Pt know that they will check that for him. Chaplains also let Pt know that chaplains are available 24/7 to talk to.  Chaplains visited with RN after visit to get understanding of Pt's situation. RN said that she will take ice chips and blankets to Pt right away, and explained feeding and bag situation to chaplains. Pt did not want prayer at this time.
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
## 2020-04-29 NOTE — Consult Note (Signed)
[place] Nurse Consult Note: Reason for Consult: Consult requested for split fistula; pt is familiar to [facility] team from recent admission.  Pt states he is able to apply a pouch to the affected area prior to admission, but requires assistance while in the hospital since he cannot visualize the location. Right upper chest with a full thickness opening and fistula with large amt thick mucous drainage; .5X.3. Current pouch is leaking and skin is red and macerated in the shape and size of the ostomy wafer.  Applied new pouch and connected to bedside drainage bag. 3 extra sets of supplies left in the room.   Dressing procedure/placement/frequency: Instructions provided for bedside nurses to change the pouch PRN if leaking as follows: Bedside nurse; please change the neck pouch PRN if leaking as follows:  1. Remove pouch, cleanse skin with warm water and pat dry.  2.  If skin is very red and macerated, apply a layer of ostomy powder (left at bedside), then brush off the excess.   3. Apply skin prep wipes over all dry exposed skin.  If ostomy powder was applied, then dab over the powder areas to add a protective layer. 4.  Use pattern (left in room) to cut an opening in the wafer, then apply a barrier ring stretched to fit around the opening. ( Barrier rings, Kellie Simmering # [phone], ostomy wafer Kellie Simmering # [id number], Urostomy pouch [place] # [id number].  Apply the barrier ring/pouch over the opening and press around edges to seal.  6.  Cut a piece of Tegaderm in half and apply 4 pieces around the pouch edges to help hold in place, like a picture frame, or use pink tape. 7.  Make sure the spout is turned on (a drop should be showing) and attach to the bedside drainage bag. [place] team will remain available if further assistance is requested.  Julien Girt MSN, RN, [place], [place], [place]

## 2020-04-30 ENCOUNTER — Inpatient Hospital Stay: Payer: Medicaid Other

## 2020-04-30 ENCOUNTER — Inpatient Hospital Stay: Payer: Self-pay

## 2020-04-30 LAB — BASIC METABOLIC PANEL
Anion gap: 5 (ref 5–15)
BUN: 11 mg/dL (ref 6–20)
CO2: 29 mmol/L (ref 22–32)
Calcium: 9.5 mg/dL (ref 8.9–10.3)
Chloride: 107 mmol/L (ref 98–111)
Creatinine, Ser: 0.5 mg/dL — ABNORMAL LOW (ref 0.61–1.24)
GFR calc Af Amer: >60 mL/min (ref >60)
GFR calc non Af Amer: >60 mL/min (ref >60)
Glucose, Bld: 83 mg/dL (ref 70–99)
Potassium: 3 mmol/L — ABNORMAL LOW (ref 3.5–5.1)
Sodium: 141 mmol/L (ref 135–145)

## 2020-04-30 LAB — GLUCOSE, CAPILLARY
Glucose-Capillary: 110 mg/dL — ABNORMAL HIGH (ref 70–99)
Glucose-Capillary: 111 mg/dL — ABNORMAL HIGH (ref 70–99)
Glucose-Capillary: 72 mg/dL (ref 70–99)
Glucose-Capillary: 78 mg/dL (ref 70–99)
Glucose-Capillary: 90 mg/dL (ref 70–99)
Glucose-Capillary: 97 mg/dL (ref 70–99)

## 2020-04-30 LAB — CBC
HCT: 35.5 % — ABNORMAL LOW (ref 39.0–52.0)
Hemoglobin: 11.6 g/dL — ABNORMAL LOW (ref 13.0–17.0)
MCH: 29.3 pg (ref 26.0–34.0)
MCHC: 32.7 g/dL (ref 30.0–36.0)
MCV: 89.6 fL (ref 80.0–100.0)
Platelets: 141 10*3/uL — ABNORMAL LOW (ref 150–400)
RBC: 3.96 MIL/uL — ABNORMAL LOW (ref 4.22–5.81)
RDW: 15.9 % — ABNORMAL HIGH (ref 11.5–15.5)
WBC: 6.2 10*3/uL (ref 4.0–10.5)
nRBC: 0 % (ref 0.0–0.2)

## 2020-04-30 LAB — URINE CULTURE: Culture: 40000 — AB

## 2020-04-30 LAB — MAGNESIUM: Magnesium: 2.2 mg/dL (ref 1.7–2.4)

## 2020-04-30 MED ORDER — POTASSIUM CHLORIDE 10 MEQ/100ML IV SOLN
10.0000 meq | INTRAVENOUS | Status: AC
  Administered 2020-04-30 (×4): 10 meq via INTRAVENOUS
  Filled 2020-04-30 (×2): qty 100

## 2020-04-30 MED ORDER — SODIUM CHLORIDE 0.9% FLUSH
10.0000 mL | Freq: Two times a day (BID) | INTRAVENOUS | Status: DC
  Administered 2020-04-30 (×2): 10 mL
  Administered 2020-05-01: 20 mL
  Administered 2020-05-01 – 2020-05-06 (×10): 10 mL
  Administered 2020-05-07: 20 mL

## 2020-04-30 MED ORDER — IPRATROPIUM-ALBUTEROL 0.5-2.5 (3) MG/3ML IN SOLN: 3.0000 mL | RESPIRATORY_TRACT | Status: DC | PRN

## 2020-04-30 MED ORDER — FLUCONAZOLE 100MG IVPB
100.0000 mg | INTRAVENOUS | Status: AC
  Administered 2020-04-30 – 2020-05-02 (×3): 100 mg via INTRAVENOUS
  Filled 2020-04-30 (×3): qty 50

## 2020-04-30 MED ORDER — SODIUM CHLORIDE 0.9% FLUSH
10.0000 mL | INTRAVENOUS | Status: DC | PRN
  Administered 2020-05-05 – 2020-05-06 (×2): 10 mL

## 2020-04-30 MED ORDER — IPRATROPIUM-ALBUTEROL 0.5-2.5 (3) MG/3ML IN SOLN
3.0000 mL | Freq: Two times a day (BID) | RESPIRATORY_TRACT | Status: DC
  Administered 2020-05-01 – 2020-05-07 (×11): 3 mL via RESPIRATORY_TRACT
  Filled 2020-04-30 (×12): qty 3

## 2020-04-30 NOTE — Consult Note (Addendum)
[place] Nurse wound follow up Pouch is intact to right neck fistula site with good seal and large amt thick mucous to bedside drainage bag.  Pt states it was leaking last night and the bedside nurse changed it.  Instructions have been provided for bedside nurses to perform PRN if pouch is leaking and there are 4 sets of barrier rings, wafers, and pouches left in the room. Julien Girt MSN, RN, [place], [place], [place]
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
## 2020-04-30 NOTE — Progress Notes (Signed)
Peripherally Inserted Central Catheter Placement  The IV Nurse has discussed with the patient and/or persons authorized to consent for the patient, the purpose of this procedure and the potential benefits and risks involved with this procedure.  The benefits include less needle sticks, lab draws from the catheter, and the patient may be discharged home with the catheter. Risks include, but not limited to, infection, bleeding, blood clot (thrombus formation), and puncture of an artery; nerve damage and irregular heartbeat and possibility to perform a PICC exchange if needed/ordered by physician.  Alternatives to this procedure were also discussed.  Bard Power PICC patient education guide, fact sheet on infection prevention and patient information card has been provided to patient /or left at bedside.    PICC Placement Documentation  PICC Double Lumen 04/30/20 PICC Left Brachial 39 cm 0 cm (Active)  Indication for Insertion or Continuance of Line Administration of hyperosmolar/irritating solutions (i.e. TPN, Vancomycin, etc.) 04/30/20 1500  Exposed Catheter (cm) 0 cm 04/30/20 1500  Site Assessment Clean;Dry;Intact 04/30/20 1500  Lumen #1 Status Flushed;Saline locked;Blood return noted 04/30/20 1500  Lumen #2 Status Flushed;Saline locked;Blood return noted 04/30/20 1500  Dressing Type Transparent;Securing device 04/30/20 1500  Dressing Status Clean;Dry;Intact;Antimicrobial disc in place 04/30/20 1500  Dressing Change Due 05/07/20 04/30/20 1500       Holley Bouche [place] 04/30/2020, 3:41 PM
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
## 2020-04-30 NOTE — Plan of Care (Signed)
  Problem: Education: Goal: Knowledge of General Education information will improve Description: Including pain rating scale, medication(s)/side effects and non-pharmacologic comfort measures Outcome: Progressing   Problem: Clinical Measurements: Goal: Ability to maintain clinical measurements within normal limits will improve Outcome: Progressing Goal: Will remain free from infection Outcome: Progressing Goal: Diagnostic test results will improve Outcome: Progressing   Problem: Coping: Goal: Level of anxiety will decrease Outcome: Progressing   

## 2020-05-01 LAB — GLUCOSE, CAPILLARY
Glucose-Capillary: 71 mg/dL (ref 70–99)
Glucose-Capillary: 75 mg/dL (ref 70–99)
Glucose-Capillary: 97 mg/dL (ref 70–99)
Glucose-Capillary: 76 mg/dL (ref 70–99)
Glucose-Capillary: 66 mg/dL — ABNORMAL LOW (ref 70–99)
Glucose-Capillary: 75 mg/dL (ref 70–99)
Glucose-Capillary: 79 mg/dL (ref 70–99)

## 2020-05-01 LAB — BASIC METABOLIC PANEL
Anion gap: 6 (ref 5–15)
BUN: 10 mg/dL (ref 6–20)
CO2: 29 mmol/L (ref 22–32)
Calcium: 9.7 mg/dL (ref 8.9–10.3)
Chloride: 105 mmol/L (ref 98–111)
Creatinine, Ser: 0.64 mg/dL (ref 0.61–1.24)
GFR calc Af Amer: >60 mL/min (ref >60)
GFR calc non Af Amer: >60 mL/min (ref >60)
Glucose, Bld: 82 mg/dL (ref 70–99)
Potassium: 3.5 mmol/L (ref 3.5–5.1)
Sodium: 140 mmol/L (ref 135–145)

## 2020-05-01 LAB — CBC
HCT: 37 % — ABNORMAL LOW (ref 39.0–52.0)
Hemoglobin: 11.9 g/dL — ABNORMAL LOW (ref 13.0–17.0)
MCH: 29.4 pg (ref 26.0–34.0)
MCHC: 32.2 g/dL (ref 30.0–36.0)
MCV: 91.4 fL (ref 80.0–100.0)
Platelets: 175 10*3/uL (ref 150–400)
RBC: 4.05 MIL/uL — ABNORMAL LOW (ref 4.22–5.81)
RDW: 15.8 % — ABNORMAL HIGH (ref 11.5–15.5)
WBC: 8.1 10*3/uL (ref 4.0–10.5)
nRBC: 0 % (ref 0.0–0.2)

## 2020-05-01 MED ORDER — FAT EMULSION 20 % IV EMUL
180.0000 mL | INTRAVENOUS | Status: AC
  Administered 2020-05-01: 180 mL via INTRAVENOUS
  Filled 2020-05-01: qty 180

## 2020-05-01 MED ORDER — TRACE MINERALS CU-MN-SE-ZN 300-55-60-3000 MCG/ML IV SOLN
INTRAVENOUS | Status: AC
  Filled 2020-05-01: qty 720

## 2020-05-01 MED ORDER — ENOXAPARIN SODIUM 40 MG/0.4ML ~~LOC~~ SOLN
40.0000 mg | SUBCUTANEOUS | Status: DC
  Filled 2020-05-01: qty 0.4
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
## 2020-05-01 NOTE — Consult Note (Signed)
[place] Nurse wound follow up Pouch is intact to right neck fistula site with good seal and large amt thick mucous to bedside drainage bag.  Pouch was changed on Mon night.  Instructions have been provided for bedside nurses to perform PRN if pouch is leaking and there are 4 sets of barrier rings, wafers, and pouches left in the room. Julien Girt MSN, RN, [place], [place], [place]

## 2020-05-01 NOTE — Consult Note (Signed)
PHARMACY - TOTAL PARENTERAL NUTRITION CONSULT NOTE   Indication: TPN bridge while awaiting new G-tube  Patient Measurements: Height: 5\' 6"  (167.6 cm) Weight: 46.2 kg (101 lb 13.6 oz) IBW/kg (Calculated) : 63.8 TPN AdjBW (KG): 37.1 Body mass index is 16.44 kg/m. Usual Weight: 46.2 kg  Assessment:  49 year old male admitted with flank pain, urinary retention, and G-tube malfunction.  Pharmacy is consulted to bridge with TPN while patient awaits new G-tube.  PICC line inserted 5/25.  Patient is at high risk of refeeding syndrome once TPN is initiated.  Glucose / Insulin: No SSI ordered.  Will follow CBG q4 hours Electrolytes: Will continue to monitor phos, mag, potassium for high refeeding risk. Renal: LFTs / TGs:  Prealbumin / albumin:  Intake / Output; MIVF:  GI Imaging: Surgeries / Procedures:   Central access: PICC placed 5/25 TPN start date: 5/26  Nutritional Goals (per RD recommendation on 5/25): kCal: 1600-1800, Protein: 70-80 g, Fluid: 1.2-1.5L/day Goal TPN rate is 30 mL/hr (provides 78 g of protein and 1733 kcals per day)  Current Nutrition:  Clear liquids  Plan:  Start TPN at 68mL/hr x 24 hours and 20% ILE 15 mL/hr x 12 hours at 1800. Electrolytes in TPN: 48mEq/L of Na, 27mEq/L of K, 38mEq/L of Ca, 57mEq/L of Mg, and 63mmol/L of Phos. Cl:Ac ratio 1:1 Add standard MVI and trace elements to TPN No SSI indicated Will stop D5 @75  mL/hr 5/26 @1759  before initiating TPN. Monitor TPN labs on Mon/Thurs Will check phos, BMET, mag with AM labs  Gerald Dexter, PharmD 05/01/2020,1:16 PM

## 2020-05-02 ENCOUNTER — Other Ambulatory Visit: Payer: Self-pay | Admitting: Radiology

## 2020-05-02 LAB — DIFFERENTIAL
Abs Immature Granulocytes: 0.04 10*3/uL (ref 0.00–0.07)
Basophils Absolute: 0.1 10*3/uL (ref 0.0–0.1)
Basophils Relative: 1 %
Eosinophils Absolute: 0.2 10*3/uL (ref 0.0–0.5)
Eosinophils Relative: 3 %
Immature Granulocytes: 1 %
Lymphocytes Relative: 24 %
Lymphs Abs: 1.8 10*3/uL (ref 0.7–4.0)
Monocytes Absolute: 0.8 10*3/uL (ref 0.1–1.0)
Monocytes Relative: 10 %
Neutro Abs: 4.5 10*3/uL (ref 1.7–7.7)
Neutrophils Relative %: 61 %

## 2020-05-02 LAB — COMPREHENSIVE METABOLIC PANEL
ALT: 20 U/L (ref 0–44)
AST: 14 U/L — ABNORMAL LOW (ref 15–41)
Albumin: 3.7 g/dL (ref 3.5–5.0)
Alkaline Phosphatase: 57 U/L (ref 38–126)
Anion gap: 7 (ref 5–15)
BUN: 12 mg/dL (ref 6–20)
CO2: 29 mmol/L (ref 22–32)
Calcium: 9.5 mg/dL (ref 8.9–10.3)
Chloride: 104 mmol/L (ref 98–111)
Creatinine, Ser: 0.62 mg/dL (ref 0.61–1.24)
GFR calc Af Amer: >60 mL/min (ref >60)
GFR calc non Af Amer: >60 mL/min (ref >60)
Glucose, Bld: 111 mg/dL — ABNORMAL HIGH (ref 70–99)
Potassium: 3.1 mmol/L — ABNORMAL LOW (ref 3.5–5.1)
Sodium: 140 mmol/L (ref 135–145)
Total Bilirubin: 0.7 mg/dL (ref 0.3–1.2)
Total Protein: 6.3 g/dL — ABNORMAL LOW (ref 6.5–8.1)

## 2020-05-02 LAB — TRIGLYCERIDES: Triglycerides: 167 mg/dL — ABNORMAL HIGH (ref <150)

## 2020-05-02 LAB — GLUCOSE, CAPILLARY
Glucose-Capillary: 104 mg/dL — ABNORMAL HIGH (ref 70–99)
Glucose-Capillary: 108 mg/dL — ABNORMAL HIGH (ref 70–99)
Glucose-Capillary: 110 mg/dL — ABNORMAL HIGH (ref 70–99)
Glucose-Capillary: 73 mg/dL (ref 70–99)
Glucose-Capillary: 91 mg/dL (ref 70–99)

## 2020-05-02 LAB — CBC
HCT: 41.3 % (ref 39.0–52.0)
Hemoglobin: 13.1 g/dL (ref 13.0–17.0)
MCH: 29 pg (ref 26.0–34.0)
MCHC: 31.7 g/dL (ref 30.0–36.0)
MCV: 91.4 fL (ref 80.0–100.0)
Platelets: 193 10*3/uL (ref 150–400)
RBC: 4.52 MIL/uL (ref 4.22–5.81)
RDW: 15.7 % — ABNORMAL HIGH (ref 11.5–15.5)
WBC: 7.4 10*3/uL (ref 4.0–10.5)
nRBC: 0 % (ref 0.0–0.2)

## 2020-05-02 LAB — PREALBUMIN: Prealbumin: 20.7 mg/dL (ref 18–38)

## 2020-05-02 LAB — MAGNESIUM: Magnesium: 2.3 mg/dL (ref 1.7–2.4)

## 2020-05-02 LAB — PHOSPHORUS: Phosphorus: 3.7 mg/dL (ref 2.5–4.6)

## 2020-05-02 MED ORDER — ENOXAPARIN SODIUM 30 MG/0.3ML ~~LOC~~ SOLN
30.0000 mg | SUBCUTANEOUS | Status: DC
  Filled 2020-05-02 (×5): qty 0.3

## 2020-05-02 MED ORDER — TRACE MINERALS CU-MN-SE-ZN 300-55-60-3000 MCG/ML IV SOLN
INTRAVENOUS | Status: AC
  Filled 2020-05-02: qty 1560

## 2020-05-02 MED ORDER — FAT EMULSION PLANT BASED 20 % IV EMUL
180.0000 mL | INTRAVENOUS | Status: AC
  Administered 2020-05-02: 180 mL via INTRAVENOUS
  Filled 2020-05-02: qty 180

## 2020-05-02 MED ORDER — POTASSIUM CHLORIDE 10 MEQ/100ML IV SOLN
10.0000 meq | INTRAVENOUS | Status: AC
  Administered 2020-05-02 (×4): 10 meq via INTRAVENOUS
  Filled 2020-05-02 (×4): qty 100

## 2020-05-02 NOTE — H&P (Signed)
PHARMACIST - PHYSICIAN COMMUNICATION  CONCERNING:  Enoxaparin (Lovenox) for DVT Prophylaxis    RECOMMENDATION: Patient was prescribed enoxaprin 40mg  q24 hours for VTE prophylaxis.   Filed Weights   04/28/20 0500 04/30/20 0619 05/02/20 0500  Weight: 46.5 kg (102 lb 8.2 oz) 46.2 kg (101 lb 13.6 oz) 50.6 kg (111 lb 8.8 oz)    Body mass index is 18.01 kg/m.  Estimated Creatinine Clearance: 79.9 mL/min (by C-G formula based on SCr of 0.62 mg/dL).  Patient is candidate for enoxaparin 30mg  every 24 hours based on CrCl <75ml/min or Weight less then 45kg for women or <57kg for men   DESCRIPTION: Pharmacy has adjusted enoxaparin dose per [facility] policy.  Patient is now receiving enoxaparin 30mg  every 24 hours.  Gerald Dexter, PharmD 05/02/2020 8:51 AM
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
## 2020-05-02 NOTE — Progress Notes (Signed)
Patient ID: Thomas [name], male   DOB: November 10, 1971, 49 y.o.   MRN: [id number] Pt scheduled for G tube exchange on 5/28; plans d/w pt and consent obtained for procedure.

## 2020-05-02 NOTE — Consult Note (Signed)
PHARMACY - TOTAL PARENTERAL NUTRITION CONSULT NOTE   Indication: TPN bridge while awaiting new G-tube  Patient Measurements: Height: 5\' 6"  (167.6 cm) Weight: 50.6 kg (111 lb 8.8 oz) IBW/kg (Calculated) : 63.8 TPN AdjBW (KG): 37.1 Body mass index is 18.01 kg/m. Usual Weight: 46.2 kg  Assessment:  49 year old male admitted with flank pain, urinary retention, and G-tube malfunction.  Pharmacy is consulted to bridge with TPN while patient awaits new G-tube.  PICC line inserted 5/25.  Patient is at high risk of refeeding syndrome once TPN is initiated.  D5 @75  mL/hr stopped prior to TPN initiation.  Glucose / Insulin: No SSI ordered.  Will follow CBG q4 hours Electrolytes: Will continue to monitor phos, mag, potassium for high refeeding risk. Renal: LFTs / TGs:  Prealbumin / albumin:  Intake / Output; MIVF:  GI Imaging: Surgeries / Procedures:  Patient is scheduled for G tube exchange on 5/28.  Central access: PICC placed 5/25 TPN start date: 5/26  Nutritional Goals (per RD recommendation on 5/25): kCal: 1600-1800, Protein: 70-80 g, Fluid: 1.2-1.5L/day Goal TPN rate is 30 mL/hr (provides 78 g of protein and 1733 kcals per day)  Current Nutrition:  Clear liquids  Plan:   TPN Start TPN at 87mL/hr x 24 hours and 20% ILE at 15 mL/hr x 12 hours at 1800. Electrolytes in TPN: 73mEq/L of Na, 78mEq/L of K, 16mEq/L of Ca, 63mEq/L of Mg, and 55mmol/L of Phos. Cl:Ac ratio 1:1 Add standard MVI and trace elements to TPN No SSI indicated Monitor TPN labs on Mon/Thurs  Electrolytes K 3.1.  Will give KCl 10 mEq q1 hour x 4 runs Will check phos, BMET, mag with AM labs  Gerald Dexter, PharmD 05/02/2020,4:49 PM

## 2020-05-02 NOTE — Consult Note (Addendum)
[place] Nurse follow-up consult Note: Reason for Consult:Current pouch is leaking to split fistula, tubing is clogged with thick jello and unable to drain. Right upper chest with a full thickness opening and fistula with large amt thick mucous drainage; .5X.3. Current pouch is leaking and skin is red and macerated in the shape and size of the ostomy wafer.  Pt is eating jello and it is clogging the drainage tube. Requested to patient he discontinue eating this and he requested to be advanced to full liquids.  Secure chat sent to primary team to request this plan.  Provided orders for the bedside nurses to flush the tubing with NS PRN if blocked. Applied new pouch and connected to bedside drainage bag; instructions provided for bedside nurses to use and extra sets of supplies left in the room.  Julien Girt MSN, RN, [place], [place], [place]

## 2020-05-03 ENCOUNTER — Encounter: Payer: Self-pay | Admitting: Internal Medicine

## 2020-05-03 ENCOUNTER — Inpatient Hospital Stay: Payer: Medicaid Other

## 2020-05-03 HISTORY — PX: IR GASTROSTOMY TUBE REMOVAL: IMG5492

## 2020-05-03 LAB — BASIC METABOLIC PANEL
Anion gap: UNDETERMINED (ref 5–15)
Anion gap: 7 (ref 5–15)
BUN: UNDETERMINED mg/dL (ref 6–20)
BUN: 21 mg/dL — ABNORMAL HIGH (ref 6–20)
CO2: UNDETERMINED mmol/L (ref 22–32)
CO2: 29 mmol/L (ref 22–32)
Calcium: UNDETERMINED mg/dL (ref 8.9–10.3)
Calcium: 9.5 mg/dL (ref 8.9–10.3)
Chloride: UNDETERMINED mmol/L (ref 98–111)
Chloride: 104 mmol/L (ref 98–111)
Creatinine, Ser: UNDETERMINED mg/dL (ref 0.61–1.24)
Creatinine, Ser: 0.6 mg/dL — ABNORMAL LOW (ref 0.61–1.24)
GFR calc Af Amer: UNDETERMINED mL/min (ref >60)
GFR calc Af Amer: >60 mL/min (ref >60)
GFR calc non Af Amer: UNDETERMINED mL/min (ref >60)
GFR calc non Af Amer: >60 mL/min (ref >60)
Glucose, Bld: UNDETERMINED mg/dL (ref 70–99)
Glucose, Bld: 101 mg/dL — ABNORMAL HIGH (ref 70–99)
Potassium: UNDETERMINED mmol/L (ref 3.5–5.1)
Potassium: 3.8 mmol/L (ref 3.5–5.1)
Sodium: UNDETERMINED mmol/L (ref 135–145)
Sodium: 140 mmol/L (ref 135–145)

## 2020-05-03 LAB — GLUCOSE, CAPILLARY
Glucose-Capillary: 108 mg/dL — ABNORMAL HIGH (ref 70–99)
Glucose-Capillary: 119 mg/dL — ABNORMAL HIGH (ref 70–99)
Glucose-Capillary: 129 mg/dL — ABNORMAL HIGH (ref 70–99)
Glucose-Capillary: 155 mg/dL — ABNORMAL HIGH (ref 70–99)
Glucose-Capillary: 57 mg/dL — ABNORMAL LOW (ref 70–99)
Glucose-Capillary: 76 mg/dL (ref 70–99)
Glucose-Capillary: 87 mg/dL (ref 70–99)

## 2020-05-03 LAB — MAGNESIUM
Magnesium: UNDETERMINED mg/dL (ref 1.7–2.4)
Magnesium: 2.2 mg/dL (ref 1.7–2.4)

## 2020-05-03 LAB — PHOSPHORUS
Phosphorus: UNDETERMINED mg/dL (ref 2.5–4.6)
Phosphorus: 3.3 mg/dL (ref 2.5–4.6)

## 2020-05-03 MED ORDER — HYDROMORPHONE HCL 1 MG/ML IJ SOLN
INTRAMUSCULAR | Status: AC
  Administered 2020-05-03: 1 mg via INTRAVENOUS
  Filled 2020-05-03: qty 1

## 2020-05-03 MED ORDER — TRACE MINERALS CU-MN-SE-ZN 300-55-60-3000 MCG/ML IV SOLN: INTRAVENOUS | Status: DC

## 2020-05-03 MED ORDER — TRACE MINERALS CU-MN-SE-ZN 300-55-60-3000 MCG/ML IV SOLN
INTRAVENOUS | Status: AC
  Filled 2020-05-03: qty 720

## 2020-05-03 MED ORDER — HYDROMORPHONE HCL 1 MG/ML IJ SOLN: 1.0000 mg | Freq: Once | INTRAMUSCULAR | Status: AC

## 2020-05-03 MED ORDER — FREE WATER
300.0000 mL | Freq: Every day | Status: DC
  Administered 2020-05-04 – 2020-05-07 (×4): 300 mL

## 2020-05-03 MED ORDER — FAT EMULSION PLANT BASED 20 % IV EMUL
180.0000 mL | INTRAVENOUS | Status: AC
  Administered 2020-05-03: 180 mL via INTRAVENOUS
  Filled 2020-05-03: qty 180

## 2020-05-03 MED ORDER — OSMOLITE 1.5 CAL PO LIQD
600.0000 mL | ORAL | Status: AC
  Administered 2020-05-03: 600 mL

## 2020-05-03 MED ORDER — IODIXANOL 320 MG/ML IV SOLN
50.0000 mL | Freq: Once | INTRAVENOUS | Status: AC | PRN
  Administered 2020-05-03: 10 mL

## 2020-05-03 MED ORDER — OSMOLITE 1.5 CAL PO LIQD
1200.0000 mL | ORAL | Status: DC
  Administered 2020-05-04: 1000 mL
  Administered 2020-05-05 – 2020-05-06 (×2): 1200 mL
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
## 2020-05-03 NOTE — Consult Note (Signed)
PHARMACY - TOTAL PARENTERAL NUTRITION CONSULT NOTE   Indication: TPN bridge while awaiting new G-tube  Patient Measurements: Height: 5\' 6"  (167.6 cm) Weight: 50.6 kg (111 lb 8.8 oz) IBW/kg (Calculated) : 63.8 TPN AdjBW (KG): 37.1 Body mass index is 18.01 kg/m. Usual Weight: 46.2 kg  Assessment:  49 year old male admitted with flank pain, urinary retention, and G-tube malfunction.  Pharmacy is consulted to bridge with TPN while patient awaits new G-tube.  PICC line inserted 5/25.  Patient is at high risk of refeeding syndrome.  D5 @75  mL/hr stopped prior to TPN initiation.  Patient received new G-tube and was inserted on 5/28, and per radiology G-tube is ready for immediate use.  Per discussion with dietary, the patient will start feeds tonight and we will wean TPN to 1/2 rate.  Plan for TPN to stop on 5/29 as long as patient is tolerating tube feeds and tube is functioning well.  Glucose / Insulin: No SSI ordered.  Will follow CBG q4 hours Electrolytes: Will continue to monitor phos, mag, potassium for high refeeding risk. Renal: LFTs / TGs:  Prealbumin / albumin:  Intake / Output; MIVF:  GI Imaging: Surgeries / Procedures:  Patient received G tube exchange on 5/28.  Central access: PICC placed 5/25 TPN start date: 5/26  Nutritional Goals (per RD recommendation on 5/25): kCal: 1600-1800, Protein: 70-80 g, Fluid: 1.2-1.5L/day Goal TPN rate is 30 mL/hr (provides 78 g of protein and 1733 kcals per day)  Current Nutrition:  Clear liquids  Plan:   TPN Start TPN at 53mL/hr x 24 hours and 20% ILE at 15 mL/hr x 12 hours at 1800. Electrolytes in TPN: 75mEq/L of Na, 9mEq/L of K, 41mEq/L of Ca, 31mEq/L of Mg, and 69mmol/L of Phos. Cl:Ac ratio 1:1 Add standard MVI and trace elements to TPN No SSI indicated Monitor TPN labs on Mon/Thurs  Electrolytes Goal:  All electrolytes WNL No replacement needed at this time. Will check phos, BMET, mag with AM labs  Gerald Dexter,  PharmD 05/03/2020,12:41 PM

## 2020-05-03 NOTE — Progress Notes (Signed)
MEDICATION-RELATED CONSULT NOTE   IR Procedure Consult - Anticoagulant/Antiplatelet PTA/Inpatient Med List Review by Pharmacist    Procedure: fluoroscopic guided replacement and up-sizing of now 36 Fr gastrostomy tube    Completed: 5/28 1229  Post-Procedural bleeding risk per IR MD assessment:  LOW  Antithrombotic medications on inpatient or PTA profile prior to procedure:   Lovenox 30 q24h    Recommended restart time per IR Post-Procedure Guidelines:    Day 0  (at least 4 hours or at next standard dose interval)     Other considerations:      Plan:    lovenox ordered to restart 5/28 evening  Chinita Greenland PharmD Clinical Pharmacist 05/03/2020

## 2020-05-03 NOTE — Consult Note (Addendum)
[place] Nurse follow-up consult Note: Reason for Consult:Current pouch is intact to split fistula; patient states it was changed last night by the bedside nurse. Istructions provided for bedside nurses to change PRN if leaking and 4 extra sets of supplies left in the room.  Julien Girt MSN, RN, [place], [place], [place]
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
## 2020-05-03 NOTE — Procedures (Signed)
Pre procedural Dx: Dysphagia, poorly functioning feeding tube. Post procedural Dx: Same  Successful fluoroscopic guided replacement and up-sizing of now 22 Fr gastrostomy tube.   The feeding tube is ready for immediate use.  EBL: None Complications: None immediate.  NOTE - The pt does NOT need to be kept NPO for routine G-tube exchanges as these procedures are performed WITHOUT conscious sedation.   Ronny Bacon, MD Pager #: [phone]
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
## 2020-05-03 NOTE — Progress Notes (Signed)
End of Shift:  Patient at bedside with call bell in reach. Alert and Oriented x 4. No signs of distress. Gave report to [facility], Therapist, sports.

## 2020-05-04 DIAGNOSIS — A419 Sepsis, unspecified organism: Secondary | ICD-10-CM

## 2020-05-04 DIAGNOSIS — R6520 Severe sepsis without septic shock: Secondary | ICD-10-CM

## 2020-05-04 LAB — GLUCOSE, CAPILLARY
Glucose-Capillary: 96 mg/dL (ref 70–99)
Glucose-Capillary: 89 mg/dL (ref 70–99)
Glucose-Capillary: 71 mg/dL (ref 70–99)
Glucose-Capillary: 112 mg/dL — ABNORMAL HIGH (ref 70–99)

## 2020-05-04 LAB — BASIC METABOLIC PANEL
Anion gap: 7 (ref 5–15)
BUN: 23 mg/dL — ABNORMAL HIGH (ref 6–20)
CO2: 32 mmol/L (ref 22–32)
Calcium: 9.6 mg/dL (ref 8.9–10.3)
Chloride: 105 mmol/L (ref 98–111)
Creatinine, Ser: 0.59 mg/dL — ABNORMAL LOW (ref 0.61–1.24)
GFR calc Af Amer: >60 mL/min (ref >60)
GFR calc non Af Amer: >60 mL/min (ref >60)
Glucose, Bld: 90 mg/dL (ref 70–99)
Potassium: 3.9 mmol/L (ref 3.5–5.1)
Sodium: 144 mmol/L (ref 135–145)

## 2020-05-04 LAB — PHOSPHORUS: Phosphorus: 3.1 mg/dL (ref 2.5–4.6)

## 2020-05-04 LAB — MAGNESIUM: Magnesium: 2.2 mg/dL (ref 1.7–2.4)
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
## 2020-05-04 NOTE — Progress Notes (Signed)
Moved patient to room 136 where room is warmer. Patient appreciative.

## 2020-05-05 LAB — GLUCOSE, CAPILLARY
Glucose-Capillary: 97 mg/dL (ref 70–99)
Glucose-Capillary: 141 mg/dL — ABNORMAL HIGH (ref 70–99)
Glucose-Capillary: 110 mg/dL — ABNORMAL HIGH (ref 70–99)
Glucose-Capillary: 83 mg/dL (ref 70–99)
Glucose-Capillary: 79 mg/dL (ref 70–99)
Glucose-Capillary: 74 mg/dL (ref 70–99)

## 2020-05-05 NOTE — Progress Notes (Signed)
Still no ostomy appliances found. Called [facility] and spoke to [place] who said they should have some and she would send one down. Paged [place] nurse for assistance as well.   [phone]- Minette Brine called back to say they had colostomy appliances stocked but not the urostomy appliance. She notified supply who states they will send one to the unit. Notified Engineer, petroleum. Primary RN malka not available. Notified charge RN that [facility] nurse had not returned call yet. Patient updated on status of ostomy appliance. Additional gauze given to him to keep at bedside to keep area dry.

## 2020-05-05 NOTE — Progress Notes (Signed)
New tube feeding with tubing hung. PEG tube c/d/i and patent.
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
## 2020-05-05 NOTE — Progress Notes (Signed)
Patient notified me that ostomy bag appliance covering chest stoma had come off. No more supplies at the bedside. Notified AC who will check supply. Patient aware and currently using gauze to cover open stoma.

## 2020-05-05 NOTE — Progress Notes (Signed)
PT Cancellation Note  Patient Details Name: Thomas [name] MRN: [id number] DOB: [birth year]   Cancelled Treatment:    Reason Eval/Treat Not Completed: Other (comment)   Pt in bed with towels over him.  Reported that "bags" are unavailable and "it's just running right out of me." Point to ostomy in upper chest/neck area.  Pt continues to eat ice pop despite issues.  Refused session at this time.  Discussed with RN who is unsure when supplies will be available.  Will continue to monitor and continue as appropriate.   Chesley Noon 05/05/2020, 9:44 AM

## 2020-05-06 DIAGNOSIS — Z931 Gastrostomy status: Secondary | ICD-10-CM

## 2020-05-06 LAB — GLUCOSE, CAPILLARY
Glucose-Capillary: 127 mg/dL — ABNORMAL HIGH (ref 70–99)
Glucose-Capillary: 145 mg/dL — ABNORMAL HIGH (ref 70–99)
Glucose-Capillary: 66 mg/dL — ABNORMAL LOW (ref 70–99)
Glucose-Capillary: 71 mg/dL (ref 70–99)
Glucose-Capillary: 75 mg/dL (ref 70–99)
Glucose-Capillary: 93 mg/dL (ref 70–99)
Glucose-Capillary: 93 mg/dL (ref 70–99)

## 2020-05-06 LAB — BASIC METABOLIC PANEL
Anion gap: 4 — ABNORMAL LOW (ref 5–15)
BUN: 23 mg/dL — ABNORMAL HIGH (ref 6–20)
CO2: 35 mmol/L — ABNORMAL HIGH (ref 22–32)
Calcium: 9.1 mg/dL (ref 8.9–10.3)
Chloride: 105 mmol/L (ref 98–111)
Creatinine, Ser: 0.6 mg/dL — ABNORMAL LOW (ref 0.61–1.24)
GFR calc Af Amer: >60 mL/min (ref >60)
GFR calc non Af Amer: >60 mL/min (ref >60)
Glucose, Bld: 133 mg/dL — ABNORMAL HIGH (ref 70–99)
Potassium: 4.3 mmol/L (ref 3.5–5.1)
Sodium: 144 mmol/L (ref 135–145)

## 2020-05-06 LAB — CBC
HCT: 34.6 % — ABNORMAL LOW (ref 39.0–52.0)
Hemoglobin: 10.8 g/dL — ABNORMAL LOW (ref 13.0–17.0)
MCH: 29.2 pg (ref 26.0–34.0)
MCHC: 31.2 g/dL (ref 30.0–36.0)
MCV: 93.5 fL (ref 80.0–100.0)
Platelets: 159 10*3/uL (ref 150–400)
RBC: 3.7 MIL/uL — ABNORMAL LOW (ref 4.22–5.81)
RDW: 16.2 % — ABNORMAL HIGH (ref 11.5–15.5)
WBC: 7.5 10*3/uL (ref 4.0–10.5)
nRBC: 0 % (ref 0.0–0.2)
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
## 2020-05-06 NOTE — Progress Notes (Signed)
Pt requests ostomy appliance change d/t leaking.  Appliance completely lifted on the medial side. Appliance was changed during the night.  Pt continues to drink and eat despite broken integrity. Pt states its just running out everywhere.   Abd dressing at gtube saturated.  Towels and washcloths covering patient completely saturated and dripping on the floor.  Blankets also saturated.    I asked patient how many times a day this has to be changed.  He says at least 3.    Appliance changed with barrier ring sealed to bag and the wafer/bag sealed well to skin.  No bubbles or areas of leaking noted.   gtube dressing changed.    Blankets changed.  More appliances ordered and brought to bedside.  Barrier rings at bedside.  I attempted to clean patients room of excess trash and single use items.  Patient refused. Stating that he uses all of the cups for extra ice and water and broth.  Will not allow me to remove the extra spoons so that he will have them later.  Greater than 10 spoons at the bedside.  Also will not allow me to discard extra lids, splenda packets and other items.  Room remains cluttered with limited work space.

## 2020-05-06 NOTE — TOC Progression Note (Signed)
Transition of Care [facility]) - Progression Note    Patient Details  Name: Thomas [name] MRN: [id number] Date of Birth: 1971-04-21  Transition of Care [facility]) CM/SW [place], RN Phone Number: 05/06/2020, 9:07 AM  Clinical Narrative:    The patient has had G Tube replaced on 5/28 and Tube feeding is at Goal, He lives at home with his mother, she is out of town and will not return until 6/1, He can't DC safely home alone and will need to wait until she returns.  He is set up with [place] for RN and PT at DC, The RNCM requested Tube feeding orders for home to begin getting set up thru [employer]. Will continue to Monitor for Needs   Expected Discharge Plan: [place] Barriers to Discharge: Barriers Resolved  Expected Discharge Plan and Services Expected Discharge Plan: [place]   Discharge Planning Services: CM Consult Post Acute Care Choice: Resumption of Svcs/PTA Provider Living arrangements for the past 2 months: Single Family Home                   DME Agency: [employer] Date DME Agency Contacted: 04/24/20 Time DME Agency Contacted: [phone] Representative spoke with at DME Agency: [place]: RN, PT [place] Agency: [place] ([place]) Date [place]: 04/24/20 Time [place]: [place] Representative spoke with at [place]: [place] ([place]) Interventions    Readmission Risk Interventions Readmission Risk Prevention Plan 04/05/2020 04/01/2020  Transportation Screening Complete Complete  PCP or Specialist Appt within 3-5 Days - Complete  HRI or [place] - Complete  Social Work Consult for [place] Planning/Counseling - Complete  Palliative Care Screening - Not Applicable  Medication Review Press photographer) Complete Complete  HRI or [place] Not Applicable -  Some recent data might be hidden

## 2020-05-07 LAB — GLUCOSE, CAPILLARY
Glucose-Capillary: 101 mg/dL — ABNORMAL HIGH (ref 70–99)
Glucose-Capillary: 140 mg/dL — ABNORMAL HIGH (ref 70–99)
Glucose-Capillary: 95 mg/dL (ref 70–99)
Glucose-Capillary: 97 mg/dL (ref 70–99)

## 2020-05-07 MED ORDER — ENOXAPARIN SODIUM 30 MG/0.3ML ~~LOC~~ SOLN: 30.0000 mg | SUBCUTANEOUS | Status: DC

## 2020-05-07 MED ORDER — OSMOLITE 1.5 CAL PO LIQD: 1200.0000 mL | ORAL | 0 refills | Status: DC

## 2020-05-07 NOTE — Consult Note (Signed)
[place] Nurse follow-up consult Note: Reason for Consult:Current pouch is leaking to split fistula, tubing is clogged and unable to drain. Right upper chest with a full thickness opening and fistula with large amt thick mucous drainage;.5X.3. Current pouch is leaking and skin is red and macerated in the shape and size of the ostomy wafer.  Applied new pouch and barrier ring and connected to bedside drainage bag; instructions provided for bedside nurses to use and 4 extra sets of supplies left in the room.  Julien Girt MSN, RN, [place], [place], [place]

## 2020-05-07 NOTE — Progress Notes (Addendum)
PICC line was removed, foley and Gtube were kept. AVS was given to pt, education was reenforced. Pt left the floor with all his belonging including cane and 1 bottle of tube feeding. Pt left  via wheelchair with the RN and NT. Mother drive the pt home.

## 2020-05-07 NOTE — TOC Progression Note (Signed)
Transition of Care [facility]) - Progression Note    Patient Details  Name: ADRIENE AKES MRN: [id number] Date of Birth: 1970/12/18  Transition of Care [facility]) CM/SW Contact  Shelbie Ammons, RN Phone Number: 05/07/2020, 12:37 PM  Clinical Narrative:   RNCM placed call to patient's mother Hassan Rowan per request of MD to assess when she would be back in town and able to pick patient up. Mom, Hassan Rowan reports that she will be back and could pick patient up as early as 2:30-3, however she was wondering if we had looked into skilled placement for patient as she is beginning to have trouble caring for him due to her own health issues. Discussed that ultimately this decision has to be made by the patient and at this point he wants to come home with home health. Encouraged Hassan Rowan to discuss her concerns with patient so that they can come to a decision.  Notified MD and bedside RN when mother will be available for d/c.     Expected Discharge Plan: [place] Barriers to Discharge: Barriers Resolved  Expected Discharge Plan and Services Expected Discharge Plan: [place]   Discharge Planning Services: CM Consult Post Acute Care Choice: Resumption of Svcs/PTA Provider Living arrangements for the past 2 months: Single Family Home                   DME Agency: [employer] Date DME Agency Contacted: 04/24/20 Time DME Agency Contacted: [phone] Representative spoke with at DME Agency: [place]oy [place]: RN, PT [place] Agency: [place] ([place]) Date [place]: 04/24/20 Time [place]: [place] Representative spoke with at [place]: [place] ([place]) Interventions    Readmission Risk Interventions Readmission Risk Prevention Plan 04/05/2020 04/01/2020  Transportation Screening Complete Complete  PCP or Specialist Appt within 3-5 Days - Complete  HRI or [place] - Complete  Social Work Consult for [place]  Planning/Counseling - Complete  Palliative Care Screening - Not Applicable  Medication Review Press photographer) Complete Complete  HRI or [place] Not Applicable -  Some recent data might be hidden

## 2020-05-07 NOTE — Discharge Summary (Signed)
Physician Discharge Summary  NAVEED KOLBER [id number] DOB: August 11, 1971 DOA: 04/21/2020  PCP: [name], NP  Admit date: 04/21/2020 Discharge date: 05/07/2020  Admitted From: Home Disposition: Home  Recommendations for Outpatient Follow-up:  1. Follow up with PCP in 1 week. 2. Follow-up with urology in 2 weeks.  Home Health: None Equipment/Devices: Chronic G-tube, esophagostoma  Discharge Condition: CODE STATUS: Full code  Diet recommendation: Osmolite 1.5 Cal at 100 mL/h x 12 hours (total 1800 kcal, 75 g protein, 9.2 mL water daily).  Flushed with 120 mL of free water before starting after completion of nocturnal tube feeds flush with an additional 3 mL free water once daily.  This provides total of 1452 mL water daily including water in the tube feeding.    Discharge Diagnoses:  Principal Problem:   Leaking percutaneous endoscopic gastrostomy (PEG) tube ([place])   Active Problems:   Hypernatremia   Protein-calorie malnutrition, severe   Dehydration with hypernatremia   Acute urinary retention   Complicated UTI (urinary tract infection)   Gastrojejunostomy tube status (HCC)   AKI (acute kidney injury) (HCC)   Hypotension   Flank pain, acute on chronic   Nephrolithiasis   Weakness   Acute bronchitis  Brief narrative/HPI 49 year old male with history of congenital esophageal atresia with colonic interposition in 1976 with multiple GI surgeries, sternal Isho for a stoma for oral secretions, severe protein calorie malnutrition, s/p G-tube (replaced by IR on 4/26), who was hospitalized from 4/29-5/4 with Klebsiella UTI with urinary retention requiring Foley that was removed by urology on 5/11 return to the ED with recurrent flank pain with urinary retention. In the ED he was hypertensive with blood pressure of 84/59, normal WBC and lactic acid, hyponatremic with sodium of 150. Had AKI with creatinine 1.69. Patient refused abdominal CT with concern for multiple CTs done in  the past. Hospital course prolonged due to malfunction G-tube (see below). Sepsis ruled out.  Hospital course  Principal problem Malfunction of G-tube. Tube feeds were coming out of the ostomy site. Unable to place a GJ tube so an exchange of the balloon tip G-tube was done by IR on 5/19, despite which he had increased leakage from the site. Replacement with a 22 French gastrostomy tube by IR done on 5/28.   Started tube feeding and now at full goal and tolerating well. PICC line will be removed prior to discharge.  Hospital course Congenital esophageal atresia with colonic interposition with severe protein calorie malnutrition Tolerating tube feeding.    Acute hematuria Etiology unclear. UTI versus traumatic Foley. Urine culture growing yeast and received 5 days of IV fluconazole.   Resolved.  H&H stable.  Acute urinary retention. Continue finasteride. Foley placed back on admission for retention. Follow-up with urology in 2 weeks.  Acute bronchitis with bronchospasm Completed steroid taper. Improved with nebs.  Acute kidney injury Suspect prerenal. Resolved with fluids.  Hypoglycemia Labile with poor p.o. intake from G-tube malfunction. Received D5W. Resolved.  Hypokalemia Replenished with IV potassium.  Hypernatremia Secondary to dehydration. Resolved with fluids.  Plan generalized weakness PT recommends home health.  Chronic pain of right ribs, back and knees. On Percocet at home.  Resumed.   Procedure: Exchange of G-tube, PICC line for TPN  Consult: IR Family communication: Discussed with mother on the phone  Disposition: Home (lives with mother)   Discharge Instructions   Allergies as of 05/07/2020      Reactions   Aspirin Other (See Comments)   Burns stomach  Penicillins    Did it involve swelling of the face/tongue/throat, SOB, or low BP? Unknown Did it involve sudden or severe rash/hives, skin peeling, or any reaction on the  inside of your mouth or nose? Unknown Did you need to seek medical attention at a hospital or doctor's office? Unknown When did it last happen?patient refuses to take due to parents being allergic If all above answers are "NO", may proceed with cephalosporin use. Parents allergic    Vinegar [acetic Acid] Other (See Comments)   seizure      Medication List    STOP taking these medications   Zinc Oxide 12.8 % ointment Commonly known as: TRIPLE PASTE     TAKE these medications   acetaminophen 500 MG tablet Commonly known as: TYLENOL Place 1,000 mg into feeding tube every 8 (eight) hours as needed for mild pain or fever.   Carboxymethylcellulose Sodium 0.25 % Soln Place 1 drop into both eyes 2 (two) times daily as needed (for dry eyes).   Commode 3-In-1 Misc Use as needed   feeding supplement (OSMOLITE 1.5 CAL) Liqd Place 1,200 mLs into feeding tube daily. What changed: how much to take   finasteride 5 MG tablet Commonly known as: PROSCAR Take 5 mg by mouth daily.   free water Soln Place 300 mLs into feeding tube every 4 (four) hours.   gabapentin 100 MG capsule Commonly known as: NEURONTIN Place 100 mg into feeding tube 2 (two) times daily.   HYDROcodone-acetaminophen 7.5-325 mg/15 ml solution Commonly known as: HYCET Place 10 mLs into feeding tube every 6 (six) hours as needed (for chronic pain).   polyethylene glycol 17 g packet Commonly known as: MIRALAX / GLYCOLAX Place 17 g into feeding tube daily as needed for mild constipation or moderate constipation.   Salonpas 3.12-12-08 % Ptch Generic drug: Camphor-Menthol-Methyl Sal Apply 1 patch topically daily.   terazosin 2 MG capsule Commonly known as: HYTRIN Place 2 mg into feeding tube at bedtime.   thiamine 100 MG tablet Place 100 mg into feeding tube daily.   traZODone 50 MG tablet Commonly known as: DESYREL Place 1 tablet (50 mg total) into feeding tube at bedtime.            Durable Medical  Equipment  (From admission, onward)         Start     Ordered   05/07/20 1047  For home use only DME Ostomy supplies  Once    Comments: Ostomy pouch and collecting bag   05/07/20 1047   05/06/20 1136  For home use only DME Other see comment  Once    Comments: Osmolite 1.5 Cal at 50 mL/hr x 12 hrs (2000-0800). - nocturnal regimen of Osmolite 1.5 Cal at 100 mL/hr x 12 hrs (2000-0800). Provides 1800 kcal, 75 grams of protein, 912 mL H2O daily. -Flush tube with 120 mL of free water before starting and after completion of nocturnal tube feeds. Flush with an additional 300 mL of free water once daily. Provides a total of 1452 mL H2O daily including water in tube feeding.  Question:  Length of Need  Answer:  Lifetime   05/06/20 1136         Follow-up Information    [name], NP. Schedule an appointment as soon as possible for a visit in 1 week(s).   Specialty: Nurse Practitioner Contact information: [place] [address] [phone]        Abbie Sons, MD. Call in 2 week(s).  Specialty: Urology Contact information: [place] [address] [phone]          Allergies  Allergen Reactions  . Aspirin Other (See Comments)    Burns stomach   . Penicillins     Did it involve swelling of the face/tongue/throat, SOB, or low BP? Unknown Did it involve sudden or severe rash/hives, skin peeling, or any reaction on the inside of your mouth or nose? Unknown Did you need to seek medical attention at a hospital or doctor's office? Unknown When did it last happen?patient refuses to take due to parents being allergic If all above answers are "NO", may proceed with cephalosporin use. Parents allergic   . Vinegar [Acetic Acid] Other (See Comments)    seizure      Procedures/Studies: DG Chest 1 View  Result Date: 04/21/2020 CLINICAL DATA:  Shortness of breath. EXAM: CHEST  1 VIEW COMPARISON:  Most recent radiograph  03/30/2020. Most recent CT 02/05/2020 FINDINGS: Chronic volume loss in the right hemithorax. Postsurgical change at the right lung base with surgical clips, chain sutures, and lower median sternotomy wires. Stable right basilar scarring with curvilinear calcifications tracking into the right upper quadrant, unchanged from prior exam. Normal heart size with unchanged mediastinal contours. No acute or focal airspace disease. Mild chronic hyperinflation. No pulmonary edema, pneumothorax, or large pleural effusion. IVC filter is in place. No acute osseous abnormalities are seen. IMPRESSION: 1. Stable radiographic appearance of the chest. No acute abnormality. 2. Scarring and postsurgical change in the right hemithorax. Electronically Signed   By: Keith Rake M.D.   On: 04/21/2020 16:11   DG Ribs Unilateral Right  Result Date: 04/23/2020 CLINICAL DATA:  Pain without trauma EXAM: RIGHT RIBS - 2 VIEW COMPARISON:  04/21/2020 and previous FINDINGS: Sternotomy wires. No definite acute fracture. No definite pneumothorax. Postop changes in the anterior right hemithorax. Osteopenia. Retrievable IVC filter with leg fracture noted. 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 IMPRESSION: 1. No  fracture, pneumothorax, other acute finding. Electronically Signed   By: Lucrezia Europe M.D.   On: 04/23/2020 10:32   DG Abd 1 View  Result Date: 04/08/2020 CLINICAL DATA:  Cracked hub of existing recently exchanged and upsized gastrostomy tube. As such, bedside gastrostomy tube exchange was performed and followed by the administration of enteric contrast to confirm placement. EXAM: ABDOMEN - 1 VIEW COMPARISON:  Fluoroscopic guided gastrostomy tube exchange and up sizing-04/01/2020 FINDINGS: Contrast has been injected via the existing gastrostomy tube demonstrating appropriate positioning within the gastric antrum with opacification of adjacent gastric  rugae a. There is reflux of contrast along the catheter tract with opacification of overlying dressing material. Redemonstrated dystrophic calcifications within the right upper abdominal quadrant and scattered calcified mesenteric lymph nodes. Paucity of bowel gas without evidence of enteric obstruction Post median sternotomy. No acute osseous abnormalities. IMPRESSION: Appropriately positioned gastrostomy tube with findings again suggestive of persistent leakage about the gastrostomy track. While the gastrostomy tube is ready for immediate use, would recommend smaller volumes administered at a slow rate with special attention to patient positioning in order to minimize pericatheter leakage. Ultimately, the patient may have to undergo gastrostomy tube exchange and up sizing, however at the present time, there are no larger gastrostomy tubes available at this institution. Electronically Signed   By: Sandi Mariscal M.D.   On: 04/08/2020 11:25   IR GASTR TUBE CONVERT GASTR-JEJ PER W/FL MOD SED  Result Date: 04/24/2020 INDICATION: Leaking balloon retention gastrostomy EXAM: FLUOROSCOPIC GASTROSTOMY EXCHANGE, UNSUCCESSFUL ATTEMPT AT CONVERTING TO A GJ TUBE. MEDICATIONS: NONE. ANESTHESIA/SEDATION: Versed 1.0 mg IV; Fentanyl 50 mcg IV Moderate Sedation Time:  25 minutes The patient was continuously monitored during the procedure by the interventional radiology nurse under my direct supervision. CONTRAST:  20 cc-administered into the gastric lumen. FLUOROSCOPY TIME:  Fluoroscopy Time: 4 minutes 6 seconds (25 mGy). COMPLICATIONS: None immediate. PROCEDURE: Informed written consent was obtained from the patient after a thorough discussion of the procedural risks, benefits and alternatives. All questions were addressed. Maximal Sterile Barrier Technique was utilized including caps, mask, sterile gowns, sterile gloves, sterile drape, hand hygiene and skin antiseptic. A timeout was performed prior to the initiation of the  procedure. The existing balloon retention 20 French gastrostomy was removed from the percutaneous tract. Attempts were made with a C2 catheter and a Kumpe catheter as well as guidewires to access the duodenum. After multiple attempts, this was unsuccessful. Therefore, a new 44 French balloon retention gastrostomy was readvanced through the percutaneous tract. Retention balloon was inflated with a total volume of 10 cc containing 1 cc contrast. This was retracted against anterior gastric wall and secured externally. IMPRESSION: Unsuccessful attempt at converting to a gastrojejunostomy feeding tube. Fluoroscopic exchange for a 20 French balloon retention gastrostomy as above. If there is continued leakage, could consider conversion to a 22/24 [place] balloon retention feeding tube. Larger sizes have been ordered in case of this. Electronically Signed   By: Jerilynn Mages.  Shick M.D.   On: 04/24/2020 16:03   DG Chest Port 1 View  Result Date: 04/30/2020 CLINICAL DATA:  PICC line placement EXAM: PORTABLE CHEST 1 VIEW COMPARISON:  04/23/2020, 04/21/2020 FINDINGS: Single frontal view of the chest demonstrates left-sided PICC tip overlying superior vena cava. The cardiac silhouette is unremarkable. Chronic scarring and emphysema without airspace disease, effusion, or pneumothorax. Linear metallic foreign bodies overlying the right lung unchanged. There is contrast within the upper abdomen consistent with prior failed gastrostomy tube exchange. IMPRESSION: 1. PICC line tip overlies superior vena cava. 2. Chronic parenchymal lung scarring. 3. Extravasated contrast within the upper abdomen related to failed gastrostomy tube exchange. Electronically Signed   By: Randa Ngo M.D.   On: 04/30/2020 16:14   DG Abd Portable 1 View  Result Date: 04/21/2020 CLINICAL DATA:  G-tube placement EXAM: PORTABLE ABDOMEN - 1 VIEW COMPARISON:  04/08/2020, CT 03/29/2019 FINDINGS: Gastrostomy balloon projects over the body of the stomach. There is  opacification of the gastric fundus and proximal duodenum. Amorphous contrast about the balloon. Scattered peritoneal calcifications as before. Nonobstructed gas pattern. IMPRESSION: 1. Gastrostomy balloon projects over the body of the stomach 2. Amorphous contrast about the balloon, potentially representing contrast leakage about the gastrostomy tract Electronically Signed   By: Donavan Foil M.D.   On: 04/21/2020 21:46   DG Replc Duoden/Jejuno Tube Percut W/Fluoro  Result Date: 04/22/2020 INDICATION: 49 year old with leaking gastrostomy tube. EXAM: GASTROSTOMY TUBE INJECTION AND MANIPULATION MEDICATIONS: None ANESTHESIA/SEDATION: None CONTRAST:  40 mL Omnipaque 300-administered into the gastric lumen. FLUOROSCOPY TIME:  Fluoroscopy Time: 1 minutes, 0000000 mGy COMPLICATIONS: None immediate. PROCEDURE: Patient was placed supine on the interventional table. Scout image was obtained of the abdomen. The gastrostomy tube was injected with contrast under fluoroscopy. The balloon was deflated and partially pulled back. The tube was advanced back into the stomach under fluoroscopy and confirmed placement in the gastric lumen. The balloon was inflated again. Additional contrast was injected to confirm  placement in the gastric lumen. FINDINGS: Contrast was filling the stomach but the gastrostomy tube was not clearly within the gastric lumen but probably in the subcutaneous tissues. After the balloon was deflated, the gastric tube was successfully advanced back into the gastric lumen. The balloon was moving freely within the gastric lumen at the end of the procedure. Contrast injection confirmed placement in the stomach. Again noted is an abnormal IVC filter. One of the filter legs may be broken and slightly cephalad to the filter hook. Abnormal shape of the IVC filter. IMPRESSION: 1. Successful repositioning of the gastrostomy tube within the stomach lumen. Gastrostomy tube is now well positioned in the stomach and ready  for use. 2. Abnormal appearance of the IVC filter with evidence for a broken or malpositioned filter leg. Electronically Signed   By: Markus Daft M.D.   On: 04/22/2020 11:36   IR GASTROSTOMY TUBE REMOVAL/REPAIR  Result Date: 05/03/2020 INDICATION: History of esophageal atresia with chronic indwelling percutaneous gastrostomy tube. Patient underwent attempted conversion of existing gastrostomy tube to a gastrojejunostomy catheter however this was unable to perform secondary to angulation of the gastrostomy access site. Note, the gastrostomy tube was placed at an outside institution. Unfortunately, the patient continues to experience persistent leakage from his balloon retention gastrostomy tube and as such presents today for fluoroscopic guided exchange and up sizing. EXAM: FLUOROSCOPIC GUIDED REPLACEMENT OF GASTROSTOMY TUBE COMPARISON:  Attempted conversion of gastrostomy to gastrojejunostomy with subsequent gastrostomy tube exchange-04/14/2020 MEDICATIONS: None. CONTRAST:  75mL VISIPAQUE IODIXANOL 320 MG/ML IV SOLN - administered into the gastric lumen FLUOROSCOPY TIME:  1 minutes, 54 seconds (123XX123 mGy) COMPLICATIONS: None immediate. PROCEDURE: Informed written consent was obtained from the patient after a discussion of the risks, benefits and alternatives to treatment. Questions regarding the procedure were encouraged and answered. A timeout was performed prior to the initiation of the procedure. The upper abdomen and external portion of the existing gastrostomy tube was prepped and draped in the usual sterile fashion, and a sterile drape was applied covering the operative field. Maximum barrier sterile technique with sterile gowns and gloves were used for the procedure. A timeout was performed prior to the initiation of the procedure. The existing gastrostomy tube was injected with a small amount of contrast confirming appropriate positioning within the gastric lumen. The existing gastrostomy tube was  cannulated with a short Amplatz wire. Under fluoroscopic guidance, the existing gastrostomy tube was exchanged for a Kumpe catheter which was utilized to manipulate the Amplatz wire to the level of the gastric fundus. Next, under intermittent fluoroscopic guidance, a new, slightly larger now 28 French gastrostomy tube was inserted via the chronic gastrostomy tube track. The balloon was insufflated with approximately 10 cc of saline and dilute contrast and pulled against the anterior inter wall the gastric lumen. The external disc was cinched. Contrast was injected as several spot fluoroscopic images were obtained in various obliquities. A dressing was applied. The patient tolerated the procedure well without immediate postprocedural complication. IMPRESSION: Successful fluoroscopic guided replacement of a new, slightly larger now 22-French gastrostomy tube. The new gastrostomy tube is ready for immediate use. Electronically Signed   By: Sandi Mariscal M.D.   On: 05/03/2020 12:27   [place] EKG SITE RITE  Result Date: 04/30/2020 If Site Rite image not attached, placement could not be confirmed due to current cardiac rhythm.    Subjective: Seen and examined.  Tolerating tube feed.  No overnight events.  Discharge Exam: Vitals:   05/07/20 [id number] 05/07/20 [id number]  BP: (!) 107/54 (!) 90/51  Pulse: 68 88  Resp: 16 16  Temp: 98.2 F (36.8 C) 97.7 F (36.5 C)  SpO2: 95% 97%   Vitals:   05/06/20 1957 05/07/20 0037 05/07/20 0500 05/07/20 0804  BP:  (!) 107/54  (!) 90/51  Pulse:  68  88  Resp:  16  16  Temp:  98.2 F (36.8 C)  97.7 F (36.5 C)  TempSrc:  Oral  Oral  SpO2: 97% 95%  97%  Weight:   44.9 kg   Height:        General: Thin built male not in distress HEENT: Moist mucosa Chest: Esophagus stoma at sternal level level, clear to auscultation CVS: Normal S1-S2 GI: Soft, G-tube in place, Foley +, nontender Musculoskeletal: Warm, no edema   The results of significant diagnostics from this  hospitalization (including imaging, microbiology, ancillary and laboratory) are listed below for reference.     Microbiology: Recent Results (from the past 240 hour(s))  Urine Culture     Status: Abnormal   Collection Time: 04/29/20  8:33 AM   Specimen: Urine, Random  Result Value Ref Range Status   Specimen Description   Final    URINE, RANDOM Performed at [facility], [address] [place], [place] [address]    Special Requests   Final    NONE Performed at [facility], [place], [place] [address]    Culture 40,000 COLONIES/mL YEAST (A)  Final   Report Status 04/30/2020 FINAL  Final     Labs: BNP (last 3 results) No results for input(s): BNP in the last 8760 hours. Basic Metabolic Panel: Recent Labs  Lab 05/02/20 0714 05/03/20 0825 05/03/20 1400 05/04/20 0816 05/06/20 0500  NA 140 SPECIMEN CONTAMINATED, UNABLE TO PERFORM TEST(S). 140 144 144  K 3.1* SPECIMEN CONTAMINATED, UNABLE TO PERFORM TEST(S). 3.8 3.9 4.3  CL 104 SPECIMEN CONTAMINATED, UNABLE TO PERFORM TEST(S). 104 105 105  CO2 29 SPECIMEN CONTAMINATED, UNABLE TO PERFORM TEST(S). 29 32 35*  GLUCOSE 111* SPECIMEN CONTAMINATED, UNABLE TO PERFORM TEST(S). 101* 90 133*  BUN 12 SPECIMEN CONTAMINATED, UNABLE TO PERFORM TEST(S). 21* 23* 23*  CREATININE 0.62 SPECIMEN CONTAMINATED, UNABLE TO PERFORM TEST(S). 0.60* 0.59* 0.60*  CALCIUM 9.5 SPECIMEN CONTAMINATED, UNABLE TO PERFORM TEST(S). 9.5 9.6 9.1  MG 2.3 SPECIMEN CONTAMINATED, UNABLE TO PERFORM TEST(S). 2.2 2.2  --   PHOS 3.7 SPECIMEN CONTAMINATED, UNABLE TO PERFORM TEST(S). 3.3 3.1  --    Liver Function Tests: Recent Labs  Lab 05/02/20 0714  AST 14*  ALT 20  ALKPHOS 57  BILITOT 0.7  PROT 6.3*  ALBUMIN 3.7   No results for input(s): LIPASE, AMYLASE in the last 168 hours. No results for input(s): AMMONIA in the last 168 hours. CBC: Recent Labs  Lab 05/01/20 0622 05/02/20 0714 05/06/20 0500  WBC 8.1 7.4 7.5   NEUTROABS  --  4.5  --   HGB 11.9* 13.1 10.8*  HCT 37.0* 41.3 34.6*  MCV 91.4 91.4 93.5  PLT 175 193 159   Cardiac Enzymes: No results for input(s): CKTOTAL, CKMB, CKMBINDEX, TROPONINI in the last 168 hours. BNP: Invalid input(s): POCBNP CBG: Recent Labs  Lab 05/06/20 2125 05/07/20 0038 05/07/20 0523 05/07/20 0807 05/07/20 1133  GLUCAP 75 101* 97 140* 95   D-Dimer No results for input(s): DDIMER in the last 72 hours. Hgb A1c No results for input(s): HGBA1C in the last 72 hours. Lipid Profile No results for input(s): CHOL, HDL, LDLCALC, TRIG, CHOLHDL,  LDLDIRECT in the last 72 hours. Thyroid function studies No results for input(s): TSH, T4TOTAL, T3FREE, THYROIDAB in the last 72 hours.  Invalid input(s): FREET3 Anemia work up No results for input(s): VITAMINB12, FOLATE, FERRITIN, TIBC, IRON, RETICCTPCT in the last 72 hours. Urinalysis    Component Value Date/Time   COLORURINE RED (A) 04/28/2020 0957   APPEARANCEUR CLOUDY (A) 04/28/2020 0957   LABSPEC 1.017 04/28/2020 0957   PHURINE  04/28/2020 0957    TEST NOT REPORTED DUE TO COLOR INTERFERENCE OF URINE PIGMENT   GLUCOSEU (A) 04/28/2020 0957    TEST NOT REPORTED DUE TO COLOR INTERFERENCE OF URINE PIGMENT   HGBUR (A) 04/28/2020 0957    TEST NOT REPORTED DUE TO COLOR INTERFERENCE OF URINE PIGMENT   BILIRUBINUR (A) 04/28/2020 0957    TEST NOT REPORTED DUE TO COLOR INTERFERENCE OF URINE PIGMENT   KETONESUR (A) 04/28/2020 0957    TEST NOT REPORTED DUE TO COLOR INTERFERENCE OF URINE PIGMENT   PROTEINUR (A) 04/28/2020 0957    TEST NOT REPORTED DUE TO COLOR INTERFERENCE OF URINE PIGMENT   UROBILINOGEN 0.2 07/26/2014 1325   NITRITE (A) 04/28/2020 0957    TEST NOT REPORTED DUE TO COLOR INTERFERENCE OF URINE PIGMENT   LEUKOCYTESUR (A) 04/28/2020 0957    TEST NOT REPORTED DUE TO COLOR INTERFERENCE OF URINE PIGMENT   Sepsis Labs Invalid input(s): PROCALCITONIN,  WBC,  LACTICIDVEN Microbiology Recent Results (from the  past 240 hour(s))  Urine Culture     Status: Abnormal   Collection Time: 04/29/20  8:33 AM   Specimen: Urine, Random  Result Value Ref Range Status   Specimen Description   Final    URINE, RANDOM Performed at [facility], [address], [place], [place] [address]    Special Requests   Final    NONE Performed at [facility], [place], [place] [address]    Culture 40,000 COLONIES/mL YEAST (A)  Final   Report Status 04/30/2020 FINAL  Final     Time coordinating discharge: 35 minutes  SIGNED:   Louellen Molder, MD  [facility] 05/07/2020, 12:55 PM Pager   If 7PM-7AM, please contact night-coverage [email or web address] Password [email or web address]

## 2020-05-07 NOTE — Progress Notes (Signed)
Consult was placed to IV Therapy to remove picc;  picc had already been removed when seen by IV Therapy.

## 2020-05-09 ENCOUNTER — Ambulatory Visit: Payer: Self-pay | Admitting: Urology

## 2020-05-09 ENCOUNTER — Ambulatory Visit: Payer: Medicaid Other | Admitting: Physician Assistant

## 2020-05-09 ENCOUNTER — Other Ambulatory Visit: Payer: Self-pay

## 2020-05-14 ENCOUNTER — Other Ambulatory Visit: Payer: Self-pay

## 2020-05-14 ENCOUNTER — Ambulatory Visit
Admission: RE | Admit: 2020-05-14 | Discharge: 2020-05-14 | Disposition: A | Discharge status: Home / Self Care | Payer: Medicaid Other | Source: Ambulatory Visit | Attending: Physician Assistant | Admitting: Physician Assistant

## 2020-05-14 DIAGNOSIS — R109 Unspecified abdominal pain: Secondary | ICD-10-CM | POA: Insufficient documentation

## 2020-05-16 ENCOUNTER — Encounter: Payer: Self-pay | Admitting: Physician Assistant

## 2020-05-16 ENCOUNTER — Ambulatory Visit: Payer: Medicaid Other | Admitting: Physician Assistant

## 2020-05-16 ENCOUNTER — Other Ambulatory Visit: Payer: Self-pay

## 2020-05-16 ENCOUNTER — Ambulatory Visit (INDEPENDENT_AMBULATORY_CARE_PROVIDER_SITE_OTHER): Payer: Medicaid Other | Admitting: Physician Assistant

## 2020-05-16 DIAGNOSIS — R339 Retention of urine, unspecified: Secondary | ICD-10-CM

## 2020-05-16 DIAGNOSIS — R310 Gross hematuria: Secondary | ICD-10-CM

## 2020-05-16 DIAGNOSIS — N200 Calculus of kidney: Secondary | ICD-10-CM | POA: Diagnosis not present

## 2020-05-16 LAB — BLADDER SCAN AMB NON-IMAGING: Scan Result: 113

## 2020-05-16 MED ORDER — TERAZOSIN HCL 2 MG PO CAPS: 2.0000 mg | ORAL_CAPSULE | Freq: Every day | ORAL | 11 refills | Status: DC

## 2020-05-16 MED ORDER — FINASTERIDE 5 MG PO TABS: 5.0000 mg | ORAL_TABLET | Freq: Every day | ORAL | 11 refills | Status: DC
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
## 2020-05-16 NOTE — Progress Notes (Signed)
Fill and Pull Catheter Removal  Patient is present today for a catheter removal.  Patient was cleaned and prepped in a sterile fashion 69ml of sterile saline was instilled into the bladder when the patient felt the urge to urinate. 17ml of water was then drained from the balloon.  A 16FR foley cath was removed from the bladder complications were noted as: severe bladder spasm with poor tolerance of bladder instillation requiring early termination.  Patient was then given some time to void on their own.  Patient can void  41ml on their own after some time.  Patient tolerated well.  Performed by: Debroah Loop, PA-C   Follow up/ Additional notes: Push fluids and RTC this afternoon for PVR.

## 2020-05-16 NOTE — Patient Instructions (Addendum)
1. If you are still having pain with urination on Monday, call our office and I will see you back in clinic for urine testing to rule out UTI. 2. If you develop lower abdominal pain, the inability to urinate, abdominal swelling, or frequent passage of small drips of urine and are concerned that you are going back into urinary retention, please go to the ED immediately OR call our office if we are open (Monday-Friday 8am-5pm). 3. I will schedule you for a cystoscopy with Dr. Bernardo Heater for further evaluation of the blood in your urine. 4. Please continue to stay well hydrated to flush your urinary system. 5. We will hold off on referring you to [facility] for further evaluation of your right kidney stone for now. If you develop more right flank pain or wish to revisit this in the future, we can refer you at that time.    Cystoscopy Cystoscopy is a procedure that is used to help diagnose and sometimes treat conditions that affect the lower urinary tract. The lower urinary tract includes the bladder and the urethra. The urethra is the tube that drains urine from the bladder. Cystoscopy is done using a thin, tube-shaped instrument with a light and camera at the end (cystoscope). The cystoscope may be hard or flexible, depending on the goal of the procedure. The cystoscope is inserted through the urethra, into the bladder. Cystoscopy may be recommended if you have:  Urinary tract infections that keep coming back.  Blood in the urine (hematuria).  An inability to control when you urinate (urinary incontinence) or an overactive bladder.  Unusual cells found in a urine sample.  A blockage in the urethra, such as a urinary stone.  Painful urination.  An abnormality in the bladder found during an intravenous pyelogram (IVP) or CT scan. Cystoscopy may also be done to remove a sample of tissue to be examined under a microscope (biopsy). What are the risks? Generally, this is a safe procedure. However, problems  may occur, including:  Infection.  Bleeding.  What happens during the procedure?  1. You will be given one or more of the following: ? A medicine to numb the area (local anesthetic). 2. The area around the opening of your urethra will be cleaned. 3. The cystoscope will be passed through your urethra into your bladder. 4. Germ-free (sterile) fluid will flow through the cystoscope to fill your bladder. The fluid will stretch your bladder so that your health care provider can clearly examine your bladder walls. 5. Your doctor will look at the urethra and bladder. 6. The cystoscope will be removed The procedure may vary among health care providers  What can I expect after the procedure? After the procedure, it is common to have: 1. Some soreness or pain in your abdomen and urethra. 2. Urinary symptoms. These include: ? Mild pain or burning when you urinate. Pain should stop within a few minutes after you urinate. This may last for up to 1 week. ? A small amount of blood in your urine for several days. ? Feeling like you need to urinate but producing only a small amount of urine. Follow these instructions at home: General instructions  Return to your normal activities as told by your health care provider.   Do not drive for 24 hours if you were given a sedative during your procedure.  Watch for any blood in your urine. If the amount of blood in your urine increases, call your health care provider.  If a tissue  sample was removed for testing (biopsy) during your procedure, it is up to you to get your test results. Ask your health care provider, or the department that is doing the test, when your results will be ready.  Drink enough fluid to keep your urine pale yellow.  Keep all follow-up visits as told by your health care provider. This is important. Contact a health care provider if you:  Have pain that gets worse or does not get better with medicine, especially pain when you  urinate.  Have trouble urinating.  Have more blood in your urine. Get help right away if you:  Have blood clots in your urine.  Have abdominal pain.  Have a fever or chills.  Are unable to urinate. Summary  Cystoscopy is a procedure that is used to help diagnose and sometimes treat conditions that affect the lower urinary tract.  Cystoscopy is done using a thin, tube-shaped instrument with a light and camera at the end.  After the procedure, it is common to have some soreness or pain in your abdomen and urethra.  Watch for any blood in your urine. If the amount of blood in your urine increases, call your health care provider.  If you were prescribed an antibiotic medicine, take it as told by your health care provider. Do not stop taking the antibiotic even if you start to feel better. This information is not intended to replace advice given to you by your health care provider. Make sure you discuss any questions you have with your health care provider. Document Revised: 11/15/2018 Document Reviewed: 11/15/2018 Elsevier Patient Education  [place].

## 2020-05-18 ENCOUNTER — Emergency Department: Payer: Medicaid Other

## 2020-05-18 ENCOUNTER — Emergency Department
Admission: EM | Admit: 2020-05-18 | Discharge: 2020-05-18 | Disposition: A | Discharge status: Home / Self Care | Payer: Medicaid Other | Attending: Emergency Medicine | Admitting: Emergency Medicine

## 2020-05-18 ENCOUNTER — Other Ambulatory Visit: Payer: Self-pay

## 2020-05-18 DIAGNOSIS — Z87891 Personal history of nicotine dependence: Secondary | ICD-10-CM | POA: Insufficient documentation

## 2020-05-18 DIAGNOSIS — Z79899 Other long term (current) drug therapy: Secondary | ICD-10-CM | POA: Diagnosis not present

## 2020-05-18 DIAGNOSIS — N390 Urinary tract infection, site not specified: Secondary | ICD-10-CM | POA: Insufficient documentation

## 2020-05-18 DIAGNOSIS — R339 Retention of urine, unspecified: Secondary | ICD-10-CM | POA: Diagnosis present

## 2020-05-18 LAB — URINALYSIS, ROUTINE W REFLEX MICROSCOPIC
Bilirubin Urine: NEGATIVE
Glucose, UA: NEGATIVE mg/dL
Ketones, ur: NEGATIVE mg/dL
Nitrite: NEGATIVE
Protein, ur: 100 mg/dL — AB
RBC / HPF: >50 RBC/hpf — ABNORMAL HIGH (ref 0–5)
Specific Gravity, Urine: 1.02 (ref 1.005–1.030)
WBC, UA: >50 WBC/hpf — ABNORMAL HIGH (ref 0–5)
pH: 5 (ref 5.0–8.0)

## 2020-05-18 MED ORDER — CEPHALEXIN 500 MG PO CAPS: 500.0000 mg | ORAL_CAPSULE | Freq: Four times a day (QID) | ORAL | 0 refills | Status: DC

## 2020-05-18 MED ORDER — CEFTRIAXONE SODIUM 1 G IJ SOLR
1.0000 g | Freq: Once | INTRAMUSCULAR | Status: AC
  Administered 2020-05-18: 1 g via INTRAMUSCULAR
  Filled 2020-05-18: qty 10
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
## 2020-05-18 NOTE — Discharge Instructions (Addendum)
Please take the antibiotics as directed.  As we discussed please talk to your primary care doctor about obtaining blood work.  Please return to the emergency department for any fevers, increased weakness, increased abdominal pain, or any other new or concerning symptoms.

## 2020-05-18 NOTE — ED Notes (Signed)
Iv  And blood draw attempted by Wells Guiles RN without success, IV team consult placed.

## 2020-05-18 NOTE — ED Notes (Addendum)
IV team nurse able to start IV, pt unwilling to leave IV in per IV team, pt states he wants PICC line. EDP notified. IV team nurse unable to draw blood, EDP aware

## 2020-05-18 NOTE — ED Triage Notes (Signed)
Pt from home, discharged from hospital this past Thursday. Had foley removed at discharge and has not urinated since. Pt with no esophagus and uses feeding tube. Pt with c/o 12/10 pain, generalized. Pt has not been doing tube feedings due to machine malfunction. Pt with c/o SOB, pt O2 100 % on room air for EMS, given O2 2 L [place] by EMS for comfort and pt's request.

## 2020-05-19 NOTE — ED Notes (Addendum)
Pt wheeled out by NT. Unable to get labs due to pt refusal of IV. EDP aware

## 2020-05-20 ENCOUNTER — Emergency Department: Payer: Medicaid Other

## 2020-05-20 ENCOUNTER — Inpatient Hospital Stay
Admission: EM | Admit: 2020-05-20 | Discharge: 2020-05-23 | DRG: 314 | Disposition: A | Discharge status: Skilled Nursing Facility | Payer: Medicaid Other | Attending: Internal Medicine | Admitting: Internal Medicine

## 2020-05-20 ENCOUNTER — Other Ambulatory Visit: Payer: Self-pay

## 2020-05-20 DIAGNOSIS — K9433 Esophagostomy malfunction: Secondary | ICD-10-CM | POA: Diagnosis present

## 2020-05-20 DIAGNOSIS — Z931 Gastrostomy status: Secondary | ICD-10-CM | POA: Diagnosis not present

## 2020-05-20 DIAGNOSIS — Z87440 Personal history of urinary (tract) infections: Secondary | ICD-10-CM

## 2020-05-20 DIAGNOSIS — Z87891 Personal history of nicotine dependence: Secondary | ICD-10-CM | POA: Diagnosis not present

## 2020-05-20 DIAGNOSIS — Z681 Body mass index (BMI) 19 or less, adult: Secondary | ICD-10-CM

## 2020-05-20 DIAGNOSIS — K219 Gastro-esophageal reflux disease without esophagitis: Secondary | ICD-10-CM | POA: Diagnosis present

## 2020-05-20 DIAGNOSIS — N390 Urinary tract infection, site not specified: Secondary | ICD-10-CM | POA: Diagnosis present

## 2020-05-20 DIAGNOSIS — N401 Enlarged prostate with lower urinary tract symptoms: Secondary | ICD-10-CM | POA: Diagnosis present

## 2020-05-20 DIAGNOSIS — Q390 Atresia of esophagus without fistula: Secondary | ICD-10-CM

## 2020-05-20 DIAGNOSIS — G894 Chronic pain syndrome: Secondary | ICD-10-CM | POA: Diagnosis present

## 2020-05-20 DIAGNOSIS — I959 Hypotension, unspecified: Secondary | ICD-10-CM | POA: Diagnosis present

## 2020-05-20 DIAGNOSIS — I10 Essential (primary) hypertension: Secondary | ICD-10-CM | POA: Diagnosis present

## 2020-05-20 DIAGNOSIS — R338 Other retention of urine: Secondary | ICD-10-CM | POA: Diagnosis present

## 2020-05-20 DIAGNOSIS — E876 Hypokalemia: Secondary | ICD-10-CM | POA: Diagnosis present

## 2020-05-20 DIAGNOSIS — Z886 Allergy status to analgesic agent status: Secondary | ICD-10-CM

## 2020-05-20 DIAGNOSIS — Z9119 Patient's noncompliance with other medical treatment and regimen: Secondary | ICD-10-CM

## 2020-05-20 DIAGNOSIS — Z880 Allergy status to penicillin: Secondary | ICD-10-CM | POA: Diagnosis not present

## 2020-05-20 DIAGNOSIS — R64 Cachexia: Secondary | ICD-10-CM | POA: Diagnosis present

## 2020-05-20 DIAGNOSIS — R531 Weakness: Secondary | ICD-10-CM | POA: Diagnosis not present

## 2020-05-20 DIAGNOSIS — Z8249 Family history of ischemic heart disease and other diseases of the circulatory system: Secondary | ICD-10-CM

## 2020-05-20 DIAGNOSIS — Z79899 Other long term (current) drug therapy: Secondary | ICD-10-CM | POA: Diagnosis not present

## 2020-05-20 DIAGNOSIS — E43 Unspecified severe protein-calorie malnutrition: Secondary | ICD-10-CM | POA: Diagnosis present

## 2020-05-20 DIAGNOSIS — I9589 Other hypotension: Secondary | ICD-10-CM | POA: Diagnosis present

## 2020-05-20 DIAGNOSIS — Z859 Personal history of malignant neoplasm, unspecified: Secondary | ICD-10-CM | POA: Diagnosis not present

## 2020-05-20 DIAGNOSIS — Z20822 Contact with and (suspected) exposure to covid-19: Secondary | ICD-10-CM | POA: Diagnosis present

## 2020-05-20 DIAGNOSIS — B961 Klebsiella pneumoniae [K. pneumoniae] as the cause of diseases classified elsewhere: Secondary | ICD-10-CM | POA: Diagnosis present

## 2020-05-20 DIAGNOSIS — R339 Retention of urine, unspecified: Secondary | ICD-10-CM | POA: Diagnosis present

## 2020-05-20 LAB — COMPREHENSIVE METABOLIC PANEL
ALT: 23 U/L (ref 0–44)
AST: 25 U/L (ref 15–41)
Albumin: 4.2 g/dL (ref 3.5–5.0)
Alkaline Phosphatase: 74 U/L (ref 38–126)
Anion gap: 13 (ref 5–15)
BUN: 33 mg/dL — ABNORMAL HIGH (ref 6–20)
CO2: 33 mmol/L — ABNORMAL HIGH (ref 22–32)
Calcium: 9.9 mg/dL (ref 8.9–10.3)
Chloride: 91 mmol/L — ABNORMAL LOW (ref 98–111)
Creatinine, Ser: 0.97 mg/dL (ref 0.61–1.24)
GFR calc Af Amer: >60 mL/min (ref >60)
GFR calc non Af Amer: >60 mL/min (ref >60)
Glucose, Bld: 126 mg/dL — ABNORMAL HIGH (ref 70–99)
Potassium: 3.1 mmol/L — ABNORMAL LOW (ref 3.5–5.1)
Sodium: 137 mmol/L (ref 135–145)
Total Bilirubin: 0.9 mg/dL (ref 0.3–1.2)
Total Protein: 7 g/dL (ref 6.5–8.1)

## 2020-05-20 LAB — CBC WITH DIFFERENTIAL/PLATELET
Abs Immature Granulocytes: 0.04 10*3/uL (ref 0.00–0.07)
Basophils Absolute: 0.1 10*3/uL (ref 0.0–0.1)
Basophils Relative: 1 %
Eosinophils Absolute: 0 10*3/uL (ref 0.0–0.5)
Eosinophils Relative: 0 %
HCT: 43.1 % (ref 39.0–52.0)
Hemoglobin: 13.8 g/dL (ref 13.0–17.0)
Immature Granulocytes: 1 %
Lymphocytes Relative: 21 %
Lymphs Abs: 1.5 10*3/uL (ref 0.7–4.0)
MCH: 29.3 pg (ref 26.0–34.0)
MCHC: 32 g/dL (ref 30.0–36.0)
MCV: 91.5 fL (ref 80.0–100.0)
Monocytes Absolute: 0.4 10*3/uL (ref 0.1–1.0)
Monocytes Relative: 5 %
Neutro Abs: 5.3 10*3/uL (ref 1.7–7.7)
Neutrophils Relative %: 72 %
Platelets: 348 10*3/uL (ref 150–400)
RBC: 4.71 MIL/uL (ref 4.22–5.81)
RDW: 15.9 % — ABNORMAL HIGH (ref 11.5–15.5)
WBC: 7.4 10*3/uL (ref 4.0–10.5)
nRBC: 0 % (ref 0.0–0.2)

## 2020-05-20 LAB — URINALYSIS, COMPLETE (UACMP) WITH MICROSCOPIC
Bilirubin Urine: NEGATIVE
Glucose, UA: NEGATIVE mg/dL
Ketones, ur: 5 mg/dL — AB
Nitrite: NEGATIVE
Protein, ur: 100 mg/dL — AB
RBC / HPF: >50 RBC/hpf — ABNORMAL HIGH (ref 0–5)
Specific Gravity, Urine: 1.033 — ABNORMAL HIGH (ref 1.005–1.030)
WBC, UA: >50 WBC/hpf — ABNORMAL HIGH (ref 0–5)
pH: 5 (ref 5.0–8.0)

## 2020-05-20 LAB — PHOSPHORUS: Phosphorus: 3.3 mg/dL (ref 2.5–4.6)

## 2020-05-20 LAB — MAGNESIUM: Magnesium: 2.6 mg/dL — ABNORMAL HIGH (ref 1.7–2.4)

## 2020-05-20 LAB — LACTIC ACID, PLASMA: Lactic Acid, Venous: 1.5 mmol/L (ref 0.5–1.9)

## 2020-05-20 LAB — SARS CORONAVIRUS 2 BY RT PCR (HOSPITAL ORDER, PERFORMED IN ~~LOC~~ HOSPITAL LAB): SARS Coronavirus 2: NEGATIVE

## 2020-05-20 LAB — PROCALCITONIN: Procalcitonin: <0.1 ng/mL

## 2020-05-20 MED ORDER — THIAMINE HCL 100 MG PO TABS
100.0000 mg | ORAL_TABLET | Freq: Every day | ORAL | Status: DC
  Administered 2020-05-20 – 2020-05-23 (×4): 100 mg
  Filled 2020-05-20 (×6): qty 1

## 2020-05-20 MED ORDER — GABAPENTIN 100 MG PO CAPS
100.0000 mg | ORAL_CAPSULE | Freq: Two times a day (BID) | ORAL | Status: DC
  Administered 2020-05-20 – 2020-05-23 (×6): 100 mg via ORAL
  Filled 2020-05-20 (×6): qty 1

## 2020-05-20 MED ORDER — SODIUM CHLORIDE 0.9% FLUSH
3.0000 mL | Freq: Two times a day (BID) | INTRAVENOUS | Status: DC
  Administered 2020-05-20 – 2020-05-23 (×5): 3 mL via INTRAVENOUS

## 2020-05-20 MED ORDER — SODIUM CHLORIDE 0.9 % IV SOLN
1.0000 g | INTRAVENOUS | Status: DC
  Administered 2020-05-20 – 2020-05-21 (×2): 1 g via INTRAVENOUS
  Filled 2020-05-20 (×2): qty 10
  Filled 2020-05-20: qty 1

## 2020-05-20 MED ORDER — FINASTERIDE 5 MG PO TABS
5.0000 mg | ORAL_TABLET | Freq: Every day | ORAL | Status: DC
  Administered 2020-05-20 – 2020-05-23 (×4): 5 mg via ORAL
  Filled 2020-05-20 (×4): qty 1

## 2020-05-20 MED ORDER — POTASSIUM CHLORIDE 20 MEQ PO PACK: 40.0000 meq | PACK | ORAL | Status: DC

## 2020-05-20 MED ORDER — ACETAMINOPHEN 500 MG PO TABS
1000.0000 mg | ORAL_TABLET | Freq: Three times a day (TID) | ORAL | Status: DC | PRN
  Administered 2020-05-22 – 2020-05-23 (×3): 1000 mg
  Filled 2020-05-20 (×3): qty 2

## 2020-05-20 MED ORDER — MORPHINE SULFATE (PF) 2 MG/ML IV SOLN
2.0000 mg | Freq: Once | INTRAVENOUS | Status: AC
  Administered 2020-05-20: 2 mg via INTRAMUSCULAR
  Filled 2020-05-20: qty 1

## 2020-05-20 MED ORDER — ONDANSETRON HCL 4 MG PO TABS: 4.0000 mg | ORAL_TABLET | Freq: Four times a day (QID) | ORAL | Status: DC | PRN

## 2020-05-20 MED ORDER — SODIUM CHLORIDE 0.9% FLUSH: 10.0000 mL | INTRAVENOUS | Status: DC | PRN

## 2020-05-20 MED ORDER — ENOXAPARIN SODIUM 40 MG/0.4ML ~~LOC~~ SOLN
40.0000 mg | SUBCUTANEOUS | Status: DC
  Administered 2020-05-20: 40 mg via SUBCUTANEOUS
  Filled 2020-05-20: qty 0.4

## 2020-05-20 MED ORDER — POLYETHYLENE GLYCOL 3350 17 G PO PACK: 17.0000 g | PACK | Freq: Every day | ORAL | Status: DC | PRN

## 2020-05-20 MED ORDER — TRAZODONE HCL 50 MG PO TABS
50.0000 mg | ORAL_TABLET | Freq: Every day | ORAL | Status: DC
  Administered 2020-05-20 – 2020-05-22 (×3): 50 mg
  Filled 2020-05-20 (×3): qty 1

## 2020-05-20 MED ORDER — POTASSIUM CHLORIDE IN NACL 20-0.9 MEQ/L-% IV SOLN
INTRAVENOUS | Status: DC
  Filled 2020-05-20 (×6): qty 1000

## 2020-05-20 MED ORDER — SODIUM CHLORIDE 0.9 % IV SOLN: 1000.0000 mL | Freq: Once | INTRAVENOUS | Status: DC

## 2020-05-20 MED ORDER — SODIUM CHLORIDE 0.9% FLUSH
3.0000 mL | INTRAVENOUS | Status: DC | PRN
  Administered 2020-05-21 (×2): 3 mL via INTRAVENOUS

## 2020-05-20 MED ORDER — ONDANSETRON HCL 4 MG/2ML IJ SOLN: 4.0000 mg | Freq: Four times a day (QID) | INTRAMUSCULAR | Status: DC | PRN

## 2020-05-20 MED ORDER — FREE WATER
300.0000 mL | Status: DC
  Administered 2020-05-20 – 2020-05-21 (×4): 300 mL

## 2020-05-20 MED ORDER — POTASSIUM CHLORIDE 20 MEQ PO PACK
40.0000 meq | PACK | Freq: Once | ORAL | Status: AC
  Administered 2020-05-20: 40 meq via ORAL
  Filled 2020-05-20: qty 2

## 2020-05-20 MED ORDER — HYDROCODONE-ACETAMINOPHEN 7.5-325 MG/15ML PO SOLN
10.0000 mL | Freq: Four times a day (QID) | ORAL | Status: DC | PRN
  Administered 2020-05-20 – 2020-05-21 (×3): 10 mL
  Filled 2020-05-20 (×3): qty 15

## 2020-05-20 MED ORDER — SODIUM CHLORIDE 0.9% FLUSH
10.0000 mL | Freq: Two times a day (BID) | INTRAVENOUS | Status: DC
  Administered 2020-05-20 – 2020-05-23 (×5): 10 mL

## 2020-05-20 MED ORDER — MIDODRINE HCL 5 MG PO TABS
5.0000 mg | ORAL_TABLET | Freq: Three times a day (TID) | ORAL | Status: DC
  Administered 2020-05-20 – 2020-05-21 (×3): 5 mg via ORAL
  Filled 2020-05-20 (×5): qty 1

## 2020-05-20 MED ORDER — SODIUM CHLORIDE 0.9 % IV SOLN
250.0000 mL | INTRAVENOUS | Status: DC | PRN
  Administered 2020-05-20: 250 mL via INTRAVENOUS

## 2020-05-20 MED ORDER — POTASSIUM CHLORIDE 20 MEQ PO PACK
40.0000 meq | PACK | ORAL | Status: AC
  Administered 2020-05-20: 40 meq via ORAL
  Filled 2020-05-20: qty 2

## 2020-05-20 MED ORDER — OSMOLITE 1.5 CAL PO LIQD
1200.0000 mL | ORAL | Status: DC
  Administered 2020-05-20: 1200 mL

## 2020-05-20 NOTE — Plan of Care (Signed)
  Problem: Nutrition Goal: Patient maintains adequate hydration Outcome: Not Progressing Goal: Patient maintains weight Outcome: Not Progressing Goal: Patient/Family demonstrates understanding of diet Outcome: Not Progressing Goal: Patient/Family independently completes tube feeding Outcome: Not Progressing Goal: Patient will have no more than 5 lb weight change during LOS Outcome: Not Progressing Goal: Patient will utilize adaptive techniques to administer nutrition Outcome: Not Progressing Goal: Patient will verbalize dietary restrictions Outcome: Not Progressing   

## 2020-05-20 NOTE — ED Notes (Signed)
Pt emaciated. States lives with his mother. Denies major dizziness, nausea, fevers. Has GI-related tube in place. Fluids from bag emptied. A&Ox4.

## 2020-05-20 NOTE — ED Notes (Signed)
Phlebotomy had success collecting all necessary bloodwork. Phlebotomy leaving bedside now.
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
## 2020-05-20 NOTE — ED Notes (Signed)
Called lab to get phlebotomy to assist with collecting bloodwork as pt hard stick and bedside staff has already tried to place IV. Lab states they'll send someone soon. Pt given warm blankets.
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
## 2020-05-20 NOTE — Progress Notes (Signed)
Responded to consult for PIV. RN states to hold off on PIV access due to IV fluids discontinued. Consult cleared.

## 2020-05-20 NOTE — ED Notes (Signed)
Pt requesting another chocolate milk, two vanilla puddings, and another chicken broth. Called and notified dietary services. State they will send soon.

## 2020-05-20 NOTE — ED Triage Notes (Addendum)
Pt in via EMS from home with mother d/t c/o hypotension, weakness, and SOB. History of issue in which esophagus removed. Initial 88/52 BP with EMS then 92/70. 96% on 3L. 78MAP, 58 HR, 18RR with EMS. A&Ox4. EMS did not attempt for IV and pt reports history of needing midline/PICC line d/t be a hard stick.

## 2020-05-20 NOTE — ED Notes (Signed)
Pt given cup of ice and lemon swab with verbal okay from [place].

## 2020-05-20 NOTE — ED Notes (Signed)
IV team at bedside 

## 2020-05-20 NOTE — ED Notes (Signed)
Pt given chicken broth as received from dietary and chocolate milk as requested.

## 2020-05-20 NOTE — ED Notes (Signed)
Pt requesting food and drink. Will check with EDP Qual[place].

## 2020-05-20 NOTE — ED Notes (Signed)
Phlebotomy at bedside.

## 2020-05-20 NOTE — ED Notes (Signed)
IV team to bedside but EDP Quale verbal d/c of IV order and fluid bolus order.

## 2020-05-20 NOTE — ED Notes (Addendum)
Sherry in dietary states she will send a tray with chicken broth soon. Verbal okay from [place]. Pt given soda, ice chips, and more lemon swabs as requested.

## 2020-05-20 NOTE — ED Notes (Signed)
EDP Quale at bedside to update pt.

## 2020-05-20 NOTE — Progress Notes (Signed)
Responded to consult. [place] in progress. Will return after completed.

## 2020-05-20 NOTE — ED Notes (Signed)
2nd RN hanging rocephin now.

## 2020-05-21 ENCOUNTER — Ambulatory Visit: Payer: Medicaid Other | Admitting: Physician Assistant

## 2020-05-21 LAB — CBC
HCT: 39.6 % (ref 39.0–52.0)
Hemoglobin: 12.9 g/dL — ABNORMAL LOW (ref 13.0–17.0)
MCH: 29.5 pg (ref 26.0–34.0)
MCHC: 32.6 g/dL (ref 30.0–36.0)
MCV: 90.6 fL (ref 80.0–100.0)
Platelets: 282 10*3/uL (ref 150–400)
RBC: 4.37 MIL/uL (ref 4.22–5.81)
RDW: 15.9 % — ABNORMAL HIGH (ref 11.5–15.5)
WBC: 8.6 10*3/uL (ref 4.0–10.5)
nRBC: 0 % (ref 0.0–0.2)

## 2020-05-21 LAB — BASIC METABOLIC PANEL
Anion gap: 8 (ref 5–15)
BUN: 26 mg/dL — ABNORMAL HIGH (ref 6–20)
CO2: 30 mmol/L (ref 22–32)
Calcium: 9.3 mg/dL (ref 8.9–10.3)
Chloride: 100 mmol/L (ref 98–111)
Creatinine, Ser: 0.83 mg/dL (ref 0.61–1.24)
GFR calc Af Amer: >60 mL/min (ref >60)
GFR calc non Af Amer: >60 mL/min (ref >60)
Glucose, Bld: 125 mg/dL — ABNORMAL HIGH (ref 70–99)
Potassium: 4.1 mmol/L (ref 3.5–5.1)
Sodium: 138 mmol/L (ref 135–145)

## 2020-05-21 LAB — URINE CULTURE: Culture: >=100000 — AB

## 2020-05-21 LAB — MRSA PCR SCREENING: MRSA by PCR: POSITIVE — AB

## 2020-05-21 LAB — MAGNESIUM: Magnesium: 2.3 mg/dL (ref 1.7–2.4)

## 2020-05-21 LAB — PHOSPHORUS: Phosphorus: 2.8 mg/dL (ref 2.5–4.6)

## 2020-05-21 MED ORDER — OXYCODONE HCL ER 10 MG PO T12A
10.0000 mg | EXTENDED_RELEASE_TABLET | Freq: Two times a day (BID) | ORAL | Status: DC
  Administered 2020-05-21 – 2020-05-22 (×3): 10 mg via ORAL
  Filled 2020-05-21 (×4): qty 1

## 2020-05-21 MED ORDER — MUPIROCIN 2 % EX OINT
1.0000 "application " | TOPICAL_OINTMENT | Freq: Two times a day (BID) | CUTANEOUS | Status: DC
  Administered 2020-05-21 – 2020-05-23 (×4): 1 via NASAL
  Filled 2020-05-21: qty 22

## 2020-05-21 MED ORDER — HYDROMORPHONE HCL 1 MG/ML IJ SOLN
0.5000 mg | Freq: Once | INTRAMUSCULAR | Status: AC | PRN
  Administered 2020-05-21: 0.5 mg via INTRAVENOUS
  Filled 2020-05-21: qty 0.5

## 2020-05-21 MED ORDER — ENOXAPARIN SODIUM 30 MG/0.3ML ~~LOC~~ SOLN
30.0000 mg | SUBCUTANEOUS | Status: DC
  Filled 2020-05-21: qty 0.3

## 2020-05-21 MED ORDER — FREE WATER
120.0000 mL | Freq: Every day | Status: DC
  Administered 2020-05-21 – 2020-05-23 (×11): 120 mL

## 2020-05-21 MED ORDER — MICONAZOLE NITRATE 2 % EX CREA
TOPICAL_CREAM | Freq: Two times a day (BID) | CUTANEOUS | Status: DC
  Filled 2020-05-21: qty 14

## 2020-05-21 MED ORDER — OXYCODONE HCL 5 MG PO TABS
5.0000 mg | ORAL_TABLET | Freq: Four times a day (QID) | ORAL | Status: DC | PRN
  Administered 2020-05-21 – 2020-05-22 (×2): 5 mg via ORAL
  Filled 2020-05-21 (×3): qty 1

## 2020-05-21 MED ORDER — CHLORHEXIDINE GLUCONATE CLOTH 2 % EX PADS
6.0000 | MEDICATED_PAD | Freq: Every day | CUTANEOUS | Status: DC
Start: 2020-05-21 — End: 2020-05-21

## 2020-05-21 MED ORDER — OSMOLITE 1.5 CAL PO LIQD
237.0000 mL | Freq: Every day | ORAL | Status: DC
  Administered 2020-05-21 – 2020-05-23 (×11): 237 mL

## 2020-05-21 MED ORDER — MIDODRINE HCL 5 MG PO TABS
10.0000 mg | ORAL_TABLET | Freq: Three times a day (TID) | ORAL | Status: DC
  Administered 2020-05-21 – 2020-05-22 (×4): 10 mg via ORAL
  Filled 2020-05-21 (×7): qty 2

## 2020-05-21 MED ORDER — CHLORHEXIDINE GLUCONATE CLOTH 2 % EX PADS
6.0000 | MEDICATED_PAD | Freq: Every day | CUTANEOUS | Status: DC
  Administered 2020-05-21 – 2020-05-22 (×2): 6 via TOPICAL

## 2020-05-21 NOTE — Progress Notes (Signed)
Initial Nutrition Assessment  DOCUMENTATION CODES:   Severe malnutrition in context of chronic illness  INTERVENTION:   Osmolite 1.5- 5 cans daily via tube  Free water flushes- Flush tube with 14ml of water before and after each tube feeding.   Regimen provides 1775kcal/day, 75g/day protein and 1562ml/day free water   NUTRITION DIAGNOSIS:   Severe Malnutrition related to chronic illness (congenital esophageal atresia and subesequent chronic esophageal issues requiring placement of G-tube) as evidenced by severe fat depletion, severe muscle depletion.  GOAL:   Patient will meet greater than or equal to 90% of their needs  MONITOR:   Labs, Weight trends, Skin, I & O's, TF tolerance  REASON FOR ASSESSMENT:   Consult Assessment of nutrition requirement/status  ASSESSMENT:   49 y.o. male with Past medical history of congenital esophageal atresia with colonic interposition in 1976 with multiple GI surgeries, sternal esophagostoma for oral secretions, severe protein calorie malnutrition, s/p G-tube (replaced by IR on 4/26), who was hospitalized from 4/29-5/4 with Klebsiella UTI with urinary retention requiring Foley that was removed by urology on 5/11, readmitted from 5/16- 6/1 due to low blood pressure, patient had malfunctioning of G-tube which was replaced by IR on 5/28, now admitted with hypotension and UTI   RD working remotely.  Spoke with pt via phone. Pt is well known to nutrition department and this RD from multiple previous admits. Pt reports that he continues to have problems with his feeding pump at home so he has been doing bolus feeds of 4-5 cans of Osmolite 1.5 daily along with 16oz of water with each tube feed. Pt reports that he has been tolerating tube feeds well. Pt has a history of being noncompliant with his tube feed regimen at home; RD suspects this is continuing to happen as pt has not gained any weight since having his tube placed and in fact has lost weight. Pt  has been educated on the importance of giving his tube feeds correctly. Will continue home tube feed regimen here in the hospital.    Medications reviewed and include: lovenox, thiamine, NaCl w/ KCl @75ml /hr, ceftriaxone   Labs reviewed: K 3.1(L), BUN 33(H), P 3.3 wnl, Mg 2.6(H)   Nutrition-Focused physical exam completed. Findings are severe fat depletions, severe muscle depletions, and no edema.   Diet Order:   Diet Order            Diet full liquid Room service appropriate? Yes; Fluid consistency: Thin  Diet effective now                EDUCATION NEEDS:   Education needs have been addressed  Skin:  Skin Assessment: Reviewed RN Assessment (MASD)  Last BM:  6/14- TYPE 7  Height:   Ht Readings from Last 1 Encounters:  05/20/20 5\' 5"  (1.651 m)    Weight:   Wt Readings from Last 1 Encounters:  05/20/20 36.3 kg    Ideal Body Weight:  61.8 kg  BMI:  Body mass index is 13.31 kg/m.  Estimated Nutritional Needs:   Kcal:  1600-1800kcal/day  Protein:  70-80g/day  Fluid:  1.2-1.5L/day  Koleen Distance MS, RD, LDN Please refer to [facility] for RD and/or RD on-call/weekend/after hours pager

## 2020-05-21 NOTE — Progress Notes (Addendum)
VAST consulted to assess midline and attempt to obtain labs. Lab was previously unsuccessful in drawing labs. VAST RN attempted to draw back blood from midline while troubleshooting line after unit RN was unsuccessful in obtaining blood return. Midline is no longer drawing back blood despite troubleshooting. VAST RN paged MD to notify of results. 1320: Dr. Dwyane Dee returned page to VAST RN; advised of midline not drawing back blood but flushing and infusing well. Educated that midlines are not always reliable for blood draws. Further advised that lab personnel were unsuccessful obtaining labs from periphery today; options remaining for blood draw are for physician to come to bedside and attempt drawing labs or order for respiratory therapist to obtain labs via arterial stick. Dwyane Dee, MD stated labs will be held for today and alternative plan for lab draw will be discussed tomorrow.

## 2020-05-21 NOTE — Consult Note (Addendum)
[facility] Nurse ostomy consult note Patient admitted for hypotension and fatigue. Is independent with pouching to spit fistula.  Is resisting care for pouching.  Will order correct pouches.  Needs 1 piece convex urostomy pouch  Stoma type/location: Right upper chest spit fistula.  Stomal assessment/size: 2 cm linear opening.  Protect with barrier ring Peristomal assessment: erythema and tenderness.  Leaks frequently Treatment options for stomal/peristomal skin: stoma powder and no sting skin prep with barrier ring  Output clear liquid (saliva) Ostomy pouching: stoma powder   No sting prep 1 piece convex pouch with spout and barrier ring  Education provided: I changed pouch and extra pouch at bedside.  Peristomal skin with fungal overgrowth.  Will request diflucan or other systemic treatment.   Enrolled patient in [place] program: NA Instructions provided for bedside nurses to change the pouch PRN if leaking as follows:Bedside nurse; please change the neck pouch PRN if leaking as follows:  1. Remove pouch, cleanse skin with warm water and pat dry.  2. If skin is very red and macerated, apply a layer of ostomy powder (left at bedside), then brush off the excess.  3. Apply skin prep wipes over all dry exposed skin. If ostomy powder was applied, then dab over the powder areas to add a protective layer. 4. No cutting necessary on pouch. then apply a barrier ring stretched to fit around the opening. ( Barrier rings, Deputy # [place] # [phone] 1 piece convex 5. Apply the barrier ring/pouch over the opening and press around edges to seal.  6.  Make sure the spout is turned on (a drop should be showing) and attach to the bedside drainage bag. Will not follow at this time.  Please re-consult if needed.  Domenic Moras MSN, RN, FNP-BC [facility] Wound, Ostomy, Continence Nurse Pager [phone]

## 2020-05-21 NOTE — Progress Notes (Signed)
Patient ordered cardiac monitored, and continuous pulse ox, will not let me hook up, will attempt again, patient says hurts skin,

## 2020-05-21 NOTE — Progress Notes (Signed)
Patient has home appliance bag for esophagus, he will not use anything we have on floor, he wants wound team to see him, attempted several changes to fix bag, leaks on his chest, area, patient wants increased frequency in pain medications, notified the np on call, she does not want to increase at this time, discuss with day provider,

## 2020-05-21 NOTE — Progress Notes (Signed)
[facility] Progress Note  Patient: Thomas [name]    DUK:[id number]  DOA: 05/20/2020     Date of Service: the patient was seen and examined on 05/21/2020  Chief Complaint  Patient presents with  . Weakness   Brief hospital course: Thomas [name] is a 49 y.o. male with Past medical history of congenital esophageal atresia with colonic interposition in 1976 with multiple GI surgeries, sternalesophagostoma for oral secretions, severe protein calorie malnutrition, s/p G-tube (replaced by IR on 4/26), who was hospitalized from 4/29-5/4 with Klebsiella UTI with urinary retention requiring Foley that was removed by urology on 5/11, readmitted from 5/16- 6/1 due to low blood pressure, patient had malfunctioning of G-tube which was replaced by IR on 5/28, then again today with complaining of hypotension and feeling fatigue and chronic pain.  Patient was seen in the ER 2 days ago found to have UTI patient received 1 dose of ceftriaxone and was discharged on Keflex and had urine retention so Foley catheter was placed.  Again UA positive for ongoing infection, urine culture 2 days ago growing Klebsiella, sensitivity pending.  Patient has chronic pain denies any other active issues at this time.   ED Course: Patient was noticed to have hypertension, BP 88/63 Hypokalemia potassium 3.1 Negative, lactic acid 1.5 normal, does not look septic UA positive 6/12 urine culture growing Klebsiella, follow sensitivity report 6/14 urine culture sent, follow report   Dependent for most of his ADL; patient's mother provide care at home   Assessment and Plan:  Assessment/Plan  Principal Problem:   Hypotension Active Problems:   Protein-calorie malnutrition, severe   Chronic pain syndrome   Acute urinary retention   Acute UTI   Hypokalemia   # Hypotension which is chronic, could be due to hypovolemia and Terazosin induced which patient is taking secondary to urine retention Held Terazosin  for now Continue IV fluid 6/14 Started midodrine 5 mg p.o. 3 times daily 6/15 increased midodrine from 5 to 10 mg p.o. 3 times daily Continue to monitor BP   # Hypokalemia, which is chronic, unknown cause Potassium repleted.  Continue to monitor and replete as needed. Magnesium within normal range   # Recurrent UTI and urine retention, patient was seen in the ED on 05/18/2020, Foley catheter was inserted and patient was sent on oral antibiotics at home Still UA is positive Urine culture repeated 6/12 urine culture growing Klebsiella, sensitivity pending Started ceftriaxone 1 g IV daily Continue Proscar for BPH, held Terazosin secondary to hypotension Continue Foley catheter Follow renal sonogram   # Severe protein calorie malnutrition s/p PEG tube,  continue PEG tube feeding Nutrition is consulted   # Chronic pain, discontinued Norco 7.5-125 mg Started OxyContin 10 mg p.o. twice daily and oxycodone 5 mg p.o. every 6 hourly as needed for breakthrough pain Titrate medications according to pain control.   Body mass index is 13.31 kg/m.  Nutrition Problem: Severe Malnutrition Etiology: chronic illness (congenital esophageal atresia and subesequent chronic esophageal issues requiring placement of G-tube) Interventions:   Nutrition: PEGt tube feeding DVT Prophylaxis: Subcutaneous Lovenox   Advance goals of care discussion: Full code  Family Communication: No family member was present at bedside, at the time of interview.  The pt provided permission to discuss medical plan with the family. Opportunity was given to ask question and all questions were answered satisfactorily.   Disposition:  Pt is from Home, admitted with Hypotension and pain, still has low BP and PAin, which precludes a safe  discharge. Discharge to Home, when BP improves and pain is under control. D/c in 1-2 days, possible tomorrow am  Subjective: No overnight issues, blood pressure is fluctuating,  patient was complaining of severe pain and requesting IV medication so 1 dose of Dilaudid will be given and increased dose of pain medications.  Started OxyContin and oxycodone as needed for breakthrough pain   Physical Exam: General:  alert oriented to time, place, and person.  Severely malnourished Appear in mild distress, affect appropriate Eyes: PERRLA ENT: Oral Mucosa Clear, moist sternalesophagostoma for oral secretions.  Attached to bag is full with secretions Neck: no JVD,  Cardiovascular: S1 and S2 Present, no Murmur,  Respiratory: good respiratory effort, Bilateral Air entry equal and Decreased, no Crackles, no wheezes Abdomen: Bowel Sound present, Soft and no tenderness, s/p PEG tube intact Skin: no rashes Extremities: no Pedal edema, no calf tenderness Neurologic: without any new focal findings Gait not checked due to patient safety concerns  Vitals:   05/20/20 2018 05/20/20 2045 05/21/20 0542 05/21/20 1512  BP:  94/73 (!) 85/70 91/62  Pulse: 84 63 67 61  Resp: (!) 22 18 20    Temp:    97.7 F (36.5 C)  TempSrc:      SpO2: 100% 95% 100% 100%  Weight:      Height:        Intake/Output Summary (Last 24 hours) at 05/21/2020 1633 Last data filed at 05/21/2020 0809 Gross per 24 hour  Intake 1675.7 ml  Output 2750 ml  Net -1074.3 ml   Filed Weights   05/20/20 1423  Weight: 36.3 kg    Data Reviewed: I have personally reviewed and interpreted daily labs, tele strips, imagings as discussed above. I reviewed all nursing notes, pharmacy notes, vitals, pertinent old records I have discussed plan of care as described above with RN and patient/family.  CBC: Recent Labs  Lab 05/20/20 1505 05/21/20 1609  WBC 7.4 8.6  NEUTROABS 5.3  --   HGB 13.8 12.9*  HCT 43.1 39.6  MCV 91.5 90.6  PLT 348 401   Basic Metabolic Panel: Recent Labs  Lab 05/20/20 1505  NA 137  K 3.1*  CL 91*  CO2 33*  GLUCOSE 126*  BUN 33*  CREATININE 0.97  CALCIUM 9.9  MG 2.6*  PHOS 3.3     Studies: US RENAL  Result Date: 05/20/2020 CLINICAL DATA:  Urinary tract infection. EXAM: RENAL / URINARY TRACT ULTRASOUND COMPLETE COMPARISON:  Renal ultrasound 6 days ago 05/14/2020. FINDINGS: Right Kidney: Renal measurements: 9.0 x 3.6 x 4.3 cm = volume: 72 mL. Increased renal echogenicity with thinning of the renal parenchyma. 6 mm nonobstructing stone in the mid kidney. No hydronephrosis. No perirenal fluid collection. Left Kidney: Renal measurements: 8.3 x 4.5 x 3.8 cm = volume: 73 mL. Borderline increased renal echogenicity. No hydronephrosis. No evidence of calculus. No perirenal fluid collection. Bladder: Not well evaluated due to overlying artifact. Other: Technically limited evaluation. IMPRESSION: 1. Stable nonobstructing right renal calculus without hydronephrosis or renal fluid collection. 2. Increased renal echogenicity consistent with chronic medical renal disease. Electronically Signed   By: Keith Rake M.D.   On: 05/20/2020 19:06    Scheduled Meds: . Chlorhexidine Gluconate Cloth  6 each Topical Q0600  . enoxaparin (LOVENOX) injection  30 mg Subcutaneous Q24H  . feeding supplement (OSMOLITE 1.5 CAL)  237 mL Per Tube 5 X Daily  . finasteride  5 mg Oral Daily  . free water  120 mL Per Tube 5  X Daily  . gabapentin  100 mg Oral BID  . miconazole   Topical BID  . midodrine  5 mg Oral TID WC  . mupirocin ointment  1 application Nasal BID  . oxyCODONE  10 mg Oral Q12H  . sodium chloride flush  10-40 mL Intracatheter Q12H  . sodium chloride flush  3 mL Intravenous Q12H  . thiamine  100 mg Per Tube Daily  . traZODone  50 mg Per Tube QHS   Continuous Infusions: . sodium chloride 10 mL/hr at 05/21/20 0502  . 0.9 % NaCl with KCl 20 mEq / L 75 mL/hr at 05/21/20 1312  . cefTRIAXone (ROCEPHIN)  IV Stopped (05/20/20 2207)   PRN Meds: sodium chloride, acetaminophen, ondansetron **OR** ondansetron (ZOFRAN) IV, oxyCODONE, polyethylene glycol, sodium chloride flush, sodium chloride  flush  Time spent: 35 minutes  Author: Val Riles. MD [facility] 05/21/2020 4:33 PM  To reach On-call, see care teams to locate the attending and reach out to them via [email or web address]. If 7PM-7AM, please contact night-coverage If you still have difficulty reaching the attending provider, please page the [facility] (Director on Call) for [facility] on amion for assistance.

## 2020-05-21 NOTE — Progress Notes (Signed)
Patient midline will not draw blood, cannot draw morning labs, patient refusing to let lab stick him, physician notified

## 2020-05-22 LAB — GLUCOSE, CAPILLARY
Glucose-Capillary: 52 mg/dL — ABNORMAL LOW (ref 70–99)
Glucose-Capillary: 142 mg/dL — ABNORMAL HIGH (ref 70–99)

## 2020-05-22 LAB — CBC
HCT: 36.3 % — ABNORMAL LOW (ref 39.0–52.0)
Hemoglobin: 11.6 g/dL — ABNORMAL LOW (ref 13.0–17.0)
MCH: 29.2 pg (ref 26.0–34.0)
MCHC: 32 g/dL (ref 30.0–36.0)
MCV: 91.4 fL (ref 80.0–100.0)
Platelets: 252 10*3/uL (ref 150–400)
RBC: 3.97 MIL/uL — ABNORMAL LOW (ref 4.22–5.81)
RDW: 16.3 % — ABNORMAL HIGH (ref 11.5–15.5)
WBC: 6.1 10*3/uL (ref 4.0–10.5)
nRBC: 0 % (ref 0.0–0.2)

## 2020-05-22 LAB — BASIC METABOLIC PANEL
Anion gap: 7 (ref 5–15)
BUN: 22 mg/dL — ABNORMAL HIGH (ref 6–20)
CO2: 31 mmol/L (ref 22–32)
Calcium: 9.5 mg/dL (ref 8.9–10.3)
Chloride: 105 mmol/L (ref 98–111)
Creatinine, Ser: 0.78 mg/dL (ref 0.61–1.24)
GFR calc Af Amer: >60 mL/min (ref >60)
GFR calc non Af Amer: >60 mL/min (ref >60)
Glucose, Bld: 79 mg/dL (ref 70–99)
Potassium: 4.8 mmol/L (ref 3.5–5.1)
Sodium: 143 mmol/L (ref 135–145)

## 2020-05-22 MED ORDER — DEXTROSE 50 % IV SOLN
25.0000 g | INTRAVENOUS | Status: AC
  Administered 2020-05-22: 25 g via INTRAVENOUS
  Filled 2020-05-22: qty 50

## 2020-05-22 MED ORDER — CEPHALEXIN 500 MG PO CAPS
500.0000 mg | ORAL_CAPSULE | Freq: Four times a day (QID) | ORAL | Status: DC
  Administered 2020-05-22 – 2020-05-23 (×5): 500 mg via ORAL
  Filled 2020-05-22 (×5): qty 1

## 2020-05-22 MED ORDER — CEPHALEXIN 500 MG PO CAPS: 500.0000 mg | ORAL_CAPSULE | Freq: Three times a day (TID) | ORAL | Status: DC

## 2020-05-22 MED ORDER — ALBUMIN HUMAN 25 % IV SOLN
25.0000 g | Freq: Once | INTRAVENOUS | Status: AC
  Administered 2020-05-23: 25 g via INTRAVENOUS
  Filled 2020-05-22: qty 100

## 2020-05-22 NOTE — Progress Notes (Signed)
Hypoglycemic Event  CBG: 52  Treatment: D50 50 mL (25 gm)  Symptoms: None  Follow-up CBG: [id number] CBG Result:142  Possible Reasons for Event: Inadequate meal intake  Comments/MD notified:yes     Francesco Sor

## 2020-05-22 NOTE — TOC Initial Note (Signed)
Transition of Care [facility]) - Initial/Assessment Note    Patient Details  Name: Thomas [name] MRN: [id number] Date of Birth: 07-15-1971  Transition of Care [facility]) CM/SW Contact:    Beverly Sessions, RN Phone Number: 05/22/2020, 1:40 PM  Clinical Narrative:                 Patient admitted from home with hypotension Patient lives at home with mother who is his primary caregiver.  Patient has sternalesophagostoma for oral secretions.  Patient is non compliant with his oral intake.  His increased oral intake makes it difficult for the pouch to stick to the site.  Patient has a peg tube and is supposed to be doing feedings at home.  It is my understanding that patient is non compliant with his feeding regimen at home.  Mother at bedside. She states that he does not allow her to assist with the feedings.   Patient open with [facility] for RN, PT, and SW  Patient now in agreement for long term placement.  Fl2 sent for signature Existing pasrr  Bed search initiated  Mother and patient aware that if no bed offers received patient will have to discharge home with resumption of home health orders. MD and department assistant director present in room for conversation   Expected Discharge Plan: [place]     Patient Goals and CMS Choice        Expected Discharge Plan and Services Expected Discharge Plan: [place]   Discharge Planning Services: CM Consult   Living arrangements for the past 2 months: Single Family Home                                      Prior Living Arrangements/Services Living arrangements for the past 2 months: Single Family Home Lives with:: Parents              Current home services: DME    Activities of Daily Living Home Assistive Devices/Equipment: Radio producer (specify quad or straight), Enteral Feeding Supplies, Feeding equipment, Ostomy supplies ADL Screening (condition at time of admission) Patient's  cognitive ability adequate to safely complete daily activities?: Yes Is the patient deaf or have difficulty hearing?: No Does the patient have difficulty seeing, even when wearing glasses/contacts?: No Does the patient have difficulty concentrating, remembering, or making decisions?: No Patient able to express need for assistance with ADLs?: Yes Does the patient have difficulty dressing or bathing?: Yes Independently performs ADLs?: Yes (appropriate for developmental age) Does the patient have difficulty walking or climbing stairs?: Yes Weakness of Legs: Both Weakness of Arms/Hands: Both  Permission Sought/Granted                  Emotional Assessment              Admission diagnosis:  Lower urinary tract infectious disease [N39.0] UTI (urinary tract infection) [N39.0] Weakness [R53.1] Generalized weakness [R53.1] Hypotension [I95.9] Urinary tract infection due to Klebsiella species [N39.0, B96.89] Patient Active Problem List   Diagnosis Date Noted  . Hypokalemia 05/20/2020  . Acute bronchitis   . Weakness   . AKI (acute kidney injury) ([place]) 04/21/2020  . History of esophagotomy 04/21/2020  . Sepsis ([place]) 04/21/2020  . Hypotension 04/21/2020  . Flank pain, acute on chronic 04/21/2020  . Nephrolithiasis 04/21/2020  . Acute UTI 04/05/2020  . Acute urinary retention 04/04/2020  .  Leaking percutaneous endoscopic gastrostomy (PEG) tube ([place]) 04/04/2020  . Dehydration   . Dehydration with hypernatremia 03/12/2020  . Chronic pain syndrome 03/12/2020  . Fall 02/17/2020  . 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 Fluid collection at surgical site 02/17/2020  . 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 Incidental pulmonary nodule 02/17/2020  . Ketosis ([place]) 02/17/2020  . 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 Low grade fever 01/13/2020  . Gastrostomy tube in place ([place]) 01/13/2020  . Dizziness 12/31/2019  . Gastrojejunostomy tube status ([place]) 12/31/2019  .  Protein-calorie malnutrition, severe 12/20/2019  . Hypernatremia 12/18/2019  . Acute esophageal ulcer with perforation 06/11/2019  . Right-sided chest pain 01/31/2019  . Anastomotic ulcer 01/31/2019   PCP:  [name], NP Pharmacy:   [facility], [place] [address] [place] [address] Phone: [phone] Fax: [phone]  [id number] - [place], [place] [employer] [address] [place] [address] Phone: [phone] Fax: [phone]     Social Determinants of Health (SDOH) Interventions    Readmission Risk Interventions Readmission Risk Prevention Plan 05/22/2020 04/05/2020 04/01/2020  Transportation Screening Complete Complete Complete  PCP or Specialist Appt within 3-5 Days - - Complete  HRI or [place] - - Complete  Social Work Consult for [place] Planning/Counseling - - Complete  Palliative Care Screening - - Not Applicable  Medication Review Press photographer) Complete Complete Complete  HRI or Home Care Consult Complete Complete -  [place] Complete Not Applicable -  Some recent data might be hidden

## 2020-05-22 NOTE — NC FL2 (Signed)
[place] LEVEL OF CARE SCREENING TOOL     IDENTIFICATION  Patient Name: Thomas [name] Birthdate: [birth year] Sex: male Admission Date (Current Location): 05/20/2020  [facility] and [place] Number:  Engineering geologist and Address:         Provider Number: [id number]  Attending Physician Name and Address:  Fritzi Mandes, MD  Relative Name and Phone Number:       Current Level of Care: Hospital Recommended Level of Care: [place] Prior Approval Number:    Date Approved/Denied:   PASRR Number: [id number]  Discharge Plan: SNF    Current Diagnoses: Patient Active Problem List   Diagnosis Date Noted  . Hypokalemia 05/20/2020  . Acute bronchitis   . Weakness   . AKI (acute kidney injury) ([place]) 04/21/2020  . History of esophagotomy 04/21/2020  . Sepsis ([place]) 04/21/2020  . Hypotension 04/21/2020  . Flank pain, acute on chronic 04/21/2020  . Nephrolithiasis 04/21/2020  . Acute UTI 04/05/2020  . Acute urinary retention 04/04/2020  . Leaking percutaneous endoscopic gastrostomy (PEG) tube ([place]) 04/04/2020  . Dehydration   . Dehydration with hypernatremia 03/12/2020  . Chronic pain syndrome 03/12/2020  . Fall 02/17/2020  . 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 Fluid collection at surgical site 02/17/2020  . 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 Incidental pulmonary nodule 02/17/2020  . Ketosis ([place]) 02/17/2020  . 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 Low grade fever 01/13/2020  . Gastrostomy tube in place ([place]) 01/13/2020  . Dizziness 12/31/2019  . Gastrojejunostomy tube status ([place]) 12/31/2019  . Protein-calorie malnutrition, severe 12/20/2019  . Hypernatremia 12/18/2019  . Acute esophageal ulcer with perforation 06/11/2019  . Right-sided chest pain 01/31/2019  . Anastomotic ulcer 01/31/2019    Orientation RESPIRATION BLADDER Height & Weight     Self, Time, Situation, Place  Normal Continent Weight: 36.3 kg Height:  5\' 5"   (165.1 cm)  BEHAVIORAL SYMPTOMS/MOOD NEUROLOGICAL BOWEL NUTRITION STATUS      Continent Diet, Feeding tube (Full liquids, and peg tube feedings)  AMBULATORY STATUS COMMUNICATION OF NEEDS Skin   Limited Assist Verbally                         Personal Care Assistance Level of Assistance              Functional Limitations Info             SPECIAL CARE FACTORS FREQUENCY  PT (By licensed PT) (sternal esophagostoma for oral secretions)                    Contractures Contractures Info: Not present    Additional Factors Info  Code Status, Allergies Code Status Info: Full Allergies Info: aspirin, penicillin, vinegar           Current Medications (05/22/2020):  This is the current hospital active medication list Current Facility-Administered Medications  Medication Dose Route Frequency Provider Last Rate Last Admin  . 0.9 %  sodium chloride infusion  250 mL Intravenous PRN Val Riles, MD 10 mL/hr at 05/21/20 1217 Restarted at 05/21/20 1217  . acetaminophen (TYLENOL) tablet 1,000 mg  1,000 mg Per Tube Q8H PRN Val Riles, MD      . cephALEXin (KEFLEX) capsule 500 mg  500 mg Oral Q8H Fritzi Mandes, MD      . Chlorhexidine Gluconate Cloth 2 % PADS 6 each  6 each Topical Q0600 Val Riles, MD   6 each at  05/22/20 0630  . enoxaparin (LOVENOX) injection 30 mg  30 mg Subcutaneous Q24H Val Riles, MD      . feeding supplement (OSMOLITE 1.5 CAL) liquid 237 mL  237 mL Per Tube 5 X Daily Val Riles, MD   237 mL at 05/22/20 0828  . finasteride (PROSCAR) tablet 5 mg  5 mg Oral Daily Val Riles, MD   5 mg at 05/22/20 0820  . free water 120 mL  120 mL Per Tube 5 X Daily Val Riles, MD   120 mL at 05/22/20 0828  . gabapentin (NEURONTIN) capsule 100 mg  100 mg Oral BID Val Riles, MD   100 mg at 05/22/20 0820  . miconazole (MICOTIN) 2 % cream   Topical BID Val Riles, MD   Given at 05/22/20 [phone]  . midodrine (PROAMATINE) tablet 10 mg  10 mg Oral TID WC Val Riles, MD   10 mg at 05/22/20 0819  . mupirocin ointment (BACTROBAN) 2 % 1 application  1 application Nasal BID Val Riles, MD   1 application at 01/[birth year]/53 [phone]  . ondansetron (ZOFRAN) tablet 4 mg  4 mg Oral Q6H PRN Val Riles, MD       Or  . ondansetron [facility]) injection 4 mg  4 mg Intravenous Q6H PRN Val Riles, MD      . oxyCODONE (Oxy IR/ROXICODONE) immediate release tablet 5 mg  5 mg Oral Q6H PRN Val Riles, MD   5 mg at 05/22/20 1054  . oxyCODONE (OXYCONTIN) 12 hr tablet 10 mg  10 mg Oral Q12H Val Riles, MD   10 mg at 05/22/20 0630  . polyethylene glycol (MIRALAX / GLYCOLAX) packet 17 g  17 g Per Tube Daily PRN Val Riles, MD      . sodium chloride flush (NS) 0.9 % injection 10-40 mL  10-40 mL Intracatheter Q12H Val Riles, MD   10 mL at 05/22/20 0807  . sodium chloride flush (NS) 0.9 % injection 10-40 mL  10-40 mL Intracatheter PRN Val Riles, MD      . sodium chloride flush (NS) 0.9 % injection 3 mL  3 mL Intravenous Q12H Val Riles, MD   3 mL at 05/21/20 2144  . sodium chloride flush (NS) 0.9 % injection 3 mL  3 mL Intravenous PRN Val Riles, MD   3 mL at 05/21/20 2316  . thiamine tablet 100 mg  100 mg Per Tube Daily Val Riles, MD   100 mg at 05/22/20 0820  . traZODone (DESYREL) tablet 50 mg  50 mg Per Tube QHS Val Riles, MD   50 mg at 05/21/20 2142     Discharge Medications: Please see discharge summary for a list of discharge medications.  Relevant Imaging Results:  Relevant Lab Results:   Additional Information [id number]  Beverly Sessions, RN

## 2020-05-22 NOTE — Progress Notes (Signed)
[facility] NAME: Thomas [name]    MR#:  [id number]  DATE OF BIRTH:  [birth year]  SUBJECTIVE:   Patient has had several bottles and ginger ale soda cans under his tray. He has several pictures of water has been drinking a lot filling up his bag. Patient has been noncompliant with his oral intake. REVIEW OF SYSTEMS:   Review of Systems  Constitutional: Negative for chills, fever and weight loss.  HENT: Negative for ear discharge, ear pain and nosebleeds.   Eyes: Negative for blurred vision, pain and discharge.  Respiratory: Negative for sputum production, shortness of breath, wheezing and stridor.   Cardiovascular: Negative for chest pain, palpitations, orthopnea and PND.  Gastrointestinal: Negative for abdominal pain, diarrhea, nausea and vomiting.  Genitourinary: Negative for frequency and urgency.  Musculoskeletal: Negative for back pain and joint pain.       Chronic pain  Neurological: Negative for sensory change, speech change, focal weakness and weakness.  Psychiatric/Behavioral: Negative for depression and hallucinations. The patient is not nervous/anxious.    Tolerating Diet PEG feeding Tolerating PT: home health  DRUG ALLERGIES:   Allergies  Allergen Reactions  . Aspirin Other (See Comments)    Burns stomach   . Penicillins     Did it involve swelling of the face/tongue/throat, SOB, or low BP? Unknown Did it involve sudden or severe rash/hives, skin peeling, or any reaction on the inside of your mouth or nose? Unknown Did you need to seek medical attention at a hospital or doctor's office? Unknown When did it last happen?patient refuses to take due to parents being allergic If all above answers are "NO", may proceed with cephalosporin use. Parents allergic   . Vinegar [Acetic Acid] Other (See Comments)    seizure    VITALS:  Blood pressure (!) 82/58, pulse 63, temperature 97.8 F (36.6 C), temperature source  Oral, resp. rate 12, height 5\' 5"  (1.651 m), weight 36.3 kg, SpO2 100 %.  PHYSICAL EXAMINATION:   Physical Exam  GENERAL:  49 y.o.-year-old patient lying in the bed with no acute distress. Thin cachectic    HEENTOral Mucosa Clear, moist sternalesophagostoma for oral secretions.Attached to bag is full with secretions :NECK:  Supple, no jugular venous distention. No thyroid enlargement, no tenderness.  LUNGS: Normal breath sounds bilaterally, no wheezing, rales, rhonchi. No use of accessory muscles of respiration.  CARDIOVASCULAR: S1, S2 normal. No murmurs, rubs, or gallops.  ABDOMEN: Soft, nontender, nondistended. Bowel sounds present. No organomegaly or mass. Peg+ foley+ EXTREMITIES: No cyanosis, clubbing or edema b/l.    NEUROLOGIC: Cranial nerves II through XII are intact. No focal Motor or sensory deficits b/l.   PSYCHIATRIC:  patient is alert and oriented x 3.  SKIN: No obvious rash, lesion, or ulcer.  Per RN .pres LABORATORY PANEL:  CBC Recent Labs  Lab 05/22/20 0730  WBC 6.1  HGB 11.6*  HCT 36.3*  PLT 252    Chemistries  Recent Labs  Lab 05/20/20 1505 05/20/20 1505 05/21/20 1609 05/21/20 1609 05/22/20 0730  NA 137   < > 138   < > 143  K 3.1*   < > 4.1   < > 4.8  CL 91*   < > 100   < > 105  CO2 33*   < > 30   < > 31  GLUCOSE 126*   < > 125*   < > 79  BUN 33*   < > 26*   < >  22*  CREATININE 0.97   < > 0.83   < > 0.78  CALCIUM 9.9   < > 9.3   < > 9.5  MG 2.6*   < > 2.3  --   --   AST 25  --   --   --   --   ALT 23  --   --   --   --   ALKPHOS 74  --   --   --   --   BILITOT 0.9  --   --   --   --    < > = values in this interval not displayed.   Cardiac Enzymes No results for input(s): TROPONINI in the last 168 hours. RADIOLOGY:  US RENAL  Result Date: 05/20/2020 CLINICAL DATA:  Urinary tract infection. EXAM: RENAL / URINARY TRACT ULTRASOUND COMPLETE COMPARISON:  Renal ultrasound 6 days ago 05/14/2020. FINDINGS: Right Kidney: Renal measurements: 9.0 x  3.6 x 4.3 cm = volume: 72 mL. Increased renal echogenicity with thinning of the renal parenchyma. 6 mm nonobstructing stone in the mid kidney. No hydronephrosis. No perirenal fluid collection. Left Kidney: Renal measurements: 8.3 x 4.5 x 3.8 cm = volume: 73 mL. Borderline increased renal echogenicity. No hydronephrosis. No evidence of calculus. No perirenal fluid collection. Bladder: Not well evaluated due to overlying artifact. Other: Technically limited evaluation. IMPRESSION: 1. Stable nonobstructing right renal calculus without hydronephrosis or renal fluid collection. 2. Increased renal echogenicity consistent with chronic medical renal disease. Electronically Signed   By: Keith Rake M.D.   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  Patient was seen in the emergency room on June 12 and Foley catheter was placed again. He was discharged on PO Keflex.  # Hypotensionwhich is chronic, could be due to poor PO intake, narcotics -recieved IV fluid -6/14 Started midodrine 5 mg p.o. 3 times daily -6/15 increased midodrine  to 10 mg p.o. 3 times daily -Continue to monitor BP--much better  # Hypokalemia,which is chronic, unknown cause Potassium repleted.  Magnesium within normal range  #Recurrent UTIand urine retention, patient was seen in the ED on 05/18/2020, Foley catheter was inserted and patient was sent on oral antibiotics at home -Urine culture from 6/14 multiple species -6/12urine culture growing Klebsiella, sensitivity noted -Started ceftriaxone 1 g IV daily--change to po kelfex as  before -Continue Proscar for BPH, held Terazosin secondary to hypotension -Will attempt to remove Foley catheter today-- if patient retains urine will consider urology consultation.  #Severe protein calorie malnutrition s/p PEG tube,  continue PEG tube feeding Ongoing issue with noncompliance with proper feeding at home (per mom) Pt has NONCOMPLIANCE with drinking lots of water and ginger ale. All the extra bottles of water, soda and pitchers were removed from the room. -fluid restriction of 1200cc was placed-- Nursing AD present  #Chronic pain, discontinued Norco 7.5-125 mg Started OxyContin 10 mg p.o. twice daily and oxycodone 5 mg p.o. every 6 hourly as needed for breakthrough pain Titrate medications according to pain control.   Body mass index is 13.31 kg/m.  Nutrition Problem: Severe Malnutrition Etiology: chronic illness (congenital esophageal atresia and subesequent chronic esophageal issues requiring placement  of G-tube) Interventions:   Nutrition:PEG tube feeding DVT Prophylaxis:Subcutaneous Lovenox   Advance goals of care discussion: Full code  Family Communication: mother was present at bedside, at the time of interview.  Nursing AD, CM and pt's RN present  Disposition:  LTC facility if we get the bed else Home with resumption of home health services. Both patient and mother agreeable.  CODE STATUS: full DVT Prophylaxis :lovenox  Status is: Inpatient  Remains inpatient appropriate because:waiting for TOC to see if pt can get LTC facility   Dispo: The patient is from: Home              Anticipated d/c is to: TBD mostly home              Anticipated d/c date is: 1 day              Patient currently is medically stable to d/c. Just waiting to see if any LTC offers are made      TOTAL TIME TAKING CARE OF THIS PATIENT: 30 minutes.  >50% time spent on counselling and coordination of care  Note: This dictation was prepared with Dragon  dictation along with smaller phrase technology. Any transcriptional errors that result from this process are unintentional.  Fritzi Mandes M.D    [facility]   CC: Primary care physician; [name], NPPatient ID: Thomas [name], male   DOB: March 31, 1971, 49 y.o.   MRN: [id number]

## 2020-05-22 NOTE — Evaluation (Addendum)
Occupational Therapy Evaluation Patient Details Name: Thomas [name] MRN: [id number] DOB: November 11, 1971 Today's Date: 05/22/2020    History of Present Illness per MD note:Thomas C [name] is a 49 y.o. male with Past medical history of congenital esophageal atresia with colonic interposition in 1976 with multiple GI surgeries, sternal esophagostoma for oral secretions, severe protein calorie malnutrition, s/p G-tube (replaced by IR on 4/26), who was hospitalized from 4/29-5/4 with Klebsiella UTI with urinary retention requiring Foley that was removed by urology on 5/11, readmitted from 5/16- 6/1 due to low blood pressure, patient had malfunctioning of G-tube which was replaced by IR on 5/28, then again today with complaining of hypotension and feeling fatigue and chronic pain.  Patient was seen in the ER 2 days ago found to have UTI patient received 1 dose of ceftriaxone and was discharged on Keflex and had urine retention so Foley catheter was placed.  Again UA positive for ongoing infection, urine culture 2 days ago growing Klebsiella, sensitivity pending.  Patient has chronic pain denies any other active issues at this time.   Clinical Impression   Thomas [name] presents to OT with generalized weakness, poor endurance, and chronic pain that impairs his ability to safely and independently complete functional tasks.  Prior to admission, pt was mod I in functional mobility using RW or SPC.  He completed sponge bathing independently 2/2 reports of not being able to get into the tub/shower unit.  He reports needing min A to don shirt.  He enjoys doing puzzles, coloring, and watching movies.  Currently, pt requires grossly min assist in ADLs 2/2 generalized weakness and pain.  He refused any OOB mobility assessment, but was mod I in bed mobility for supine <> sit.  Per PT report, pt was mod I in ambulation with RW.  Of note, pt told OTR his goal was "to die".  OTR provided supportive listening for pt to reflect on  frustration with medical issues he is experiencing, and RN notified.  He stated that his goal is "to eat and drink if his issues cannot be fixed".  OTR encouraged pt to reflect on his goals and continue engagement in occupations that are meaningful to him.  Pt verbalized understanding. Thomas [name] will continue to benefit from skilled OT services in acute setting to address functional strengthening, endurance, safety, and independence in ADLs.  Recommend HHOT upon discharge.     Follow Up Recommendations  Home health OT;Supervision/Assistance - 24 hour    Equipment Recommendations  Tub/shower seat    Recommendations for Other Services       Precautions / Restrictions Precautions Precautions: Fall Restrictions Weight Bearing Restrictions: No      Mobility Bed Mobility Overal bed mobility: Modified Independent                Transfers                 General transfer comment: not tested 2/2 pt refusal    Balance Overall balance assessment: Needs assistance Sitting-balance support: Feet unsupported;Single extremity supported Sitting balance-Leahy Scale: Poor Sitting balance - Comments: Pt unable to sit upright, leans on LUE while seated EOB.  Pt refuses to attempt sitting upright when prompted by OTR.       Standing balance comment: not tested                           ADL either performed or assessed with clinical judgement   ADL  Overall ADL's : Needs assistance/impaired Eating/Feeding: Set up;Sitting   Grooming: Set up;Sitting   Upper Body Bathing: Set up;Sitting   Lower Body Bathing: Minimal assistance;Sitting/lateral leans   Upper Body Dressing : Minimal assistance;Sitting     Lower Body Dressing Details (indicate cue type and reason): not tested   Toilet Transfer Details (indicate cue type and reason): not tested   Toileting - Clothing Manipulation Details (indicate cue type and reason): not tested   Tub/Shower Transfer Details  (indicate cue type and reason): not tested   General ADL Comments: Pt unwilling to complete functional mobility in today's evaluation.  Per PT report, pt is generally mod I in functional mobility with RW     Vision Baseline Vision/History: Wears glasses Wears Glasses: Reading only Patient Visual Report: Diplopia (reports double vision for past couple months) Vision Assessment?: Yes Eye Alignment: Within Functional Limits Ocular Range of Motion: Within Functional Limits Alignment/Gaze Preference: Within Defined Limits Tracking/Visual Pursuits: Able to track stimulus in all quads without difficulty Saccades: Within functional limits Convergence: Within functional limits Visual Fields: No apparent deficits Diplopia Assessment: Other (comment) (pt denies double vision at this time, but reports it occurs intermittently)     Perception     Praxis      Pertinent Vitals/Pain Pain Assessment: Faces Faces Pain Scale: Hurts even more Pain Location: pt reports pain "everywhere" Pain Intervention(s): Limited activity within patient's tolerance;Monitored during session     Hand Dominance Right   Extremity/Trunk Assessment Upper Extremity Assessment Upper Extremity Assessment: Generalized weakness (unable to fully assess 2/2 pt refusal)   Lower Extremity Assessment Lower Extremity Assessment: Generalized weakness;Defer to PT evaluation       Communication Communication Communication: No difficulties   Cognition Arousal/Alertness: Awake/alert Behavior During Therapy: WFL for tasks assessed/performed Overall Cognitive Status: Within Functional Limits for tasks assessed                                 General Comments: Pt is irritable at times but agreeable and understanding to OTR education   General Comments       Exercises Other Exercises Other Exercises: provided education re: OT role and plan of care, fall and safety precautions, bed mobility, importance of  engagement in meaningful occupations   Shoulder Instructions      Home Living Family/patient expects to be discharged to:: Unsure (plan to discharge to long term care facility) Living Arrangements: Parent Available Help at Discharge: Family Type of Home: House Home Access: Stairs to enter Technical brewer of Steps: 1 Entrance Stairs-Rails: None Home Layout: One level     Bathroom Shower/Tub: Teacher, early years/pre: Handicapped height     Home Equipment: Environmental consultant - 2 wheels;Cane - single point;Bedside commode          Prior Functioning/Environment Level of Independence: Independent with assistive device(s)  Gait / Transfers Assistance Needed: Pt uses RW or cane for household mobility ADL's / Homemaking Assistance Needed: Pt is independent in sponge bathing (reports unable to get in tub/shower), receives min A for donning shirt, independent in medication management   Comments: Pt enjoys doing puzzles, coloring, and watching movies.  Pt endorses one fall recently in which he tripped over his cane.        OT Problem List: Decreased strength;Decreased range of motion;Decreased activity tolerance;Impaired balance (sitting and/or standing);Pain      OT Treatment/Interventions: Self-care/ADL training;Therapeutic exercise;Energy conservation;DME and/or AE instruction;Therapeutic activities;Patient/family education;Balance  training    OT Goals(Current goals can be found in the care plan section) Acute Rehab OT Goals Patient Stated Goal: Pt states his goal is "to die", provided supportive listening and engaged pt in further discussion after he reported this OT Goal Formulation: With patient Time For Goal Achievement: 06/05/20 Potential to Achieve Goals: Fair  OT Frequency: Min 2X/week   Barriers to D/C: Decreased caregiver support          Co-evaluation              AM-PAC OT "6 Clicks" Daily Activity     Outcome Measure Help from another person eating  meals?: None Help from another person taking care of personal grooming?: A Little Help from another person toileting, which includes using toliet, bedpan, or urinal?: A Little Help from another person bathing (including washing, rinsing, drying)?: A Little Help from another person to put on and taking off regular upper body clothing?: A Little Help from another person to put on and taking off regular lower body clothing?: A Little 6 Click Score: 19   End of Session    Activity Tolerance: Patient limited by pain Patient left: in bed;with call bell/phone within reach;with bed alarm set  OT Visit Diagnosis: Other abnormalities of gait and mobility (R26.89);Muscle weakness (generalized) (M62.81);Pain                Time: 1031-5945 OT Time Calculation (min): 33 min Charges:  OT General Charges $OT Visit: 1 Visit OT Treatments $Self Care/Home Management : 23-37 mins  Myrtie Zalewski Giselle Brutus, OTR/L 05/22/20, 5:07 PM

## 2020-05-22 NOTE — Progress Notes (Signed)
Physical Therapy Evaluation Patient Details Name: Thomas [name] MRN: [id number] DOB: 1971-02-16 Today's Date: 05/22/2020   History of Present Illness  per MD note:Thomas [name] is a 49 y.o. male with Past medical history of congenital esophageal atresia with colonic interposition in 1976 with multiple GI surgeries, sternal esophagostoma for oral secretions, severe protein calorie malnutrition, s/p G-tube (replaced by IR on 4/26), who was hospitalized from 4/29-5/4 with Klebsiella UTI with urinary retention requiring Foley that was removed by urology on 5/11, readmitted from 5/16- 6/1 due to low blood pressure, patient had malfunctioning of G-tube which was replaced by IR on 5/28, then again today with complaining of hypotension and feeling fatigue and chronic pain.  Patient was seen in the ER 2 days ago found to have UTI patient received 1 dose of ceftriaxone and was discharged on Keflex and had urine retention so Foley catheter was placed.  Again UA positive for ongoing infection, urine culture 2 days ago growing Klebsiella, sensitivity pending.  Patient has chronic pain denies any other active issues at this time.  Clinical Impression  Patient performs PT evaluation and has decreased strength 3/5 BLE. Patient has normal sitting balance and good standing balance. Patient is MI for transfers sit to stand with RW, and MI for bed mobility supine <> sit, and MI for ambulation with RW 200 feet. He will benefit from skilled PT to return to PLOF , ambulation with spc household distances.     Follow Up Recommendations Home health PT    Equipment Recommendations  Rolling walker with 5" wheels    Recommendations for Other Services       Precautions / Restrictions Precautions Precautions: Fall Restrictions Weight Bearing Restrictions: No      Mobility  Bed Mobility Overal bed mobility: Modified Independent                Transfers Overall transfer level: Modified  independent Equipment used: Rolling walker (2 wheeled) Transfers: Sit to/from Stand Sit to Stand: Supervision         General transfer comment: Cues for safety  Ambulation/Gait Ambulation/Gait assistance: Modified independent (Device/Increase time) Gait Distance (Feet): 200 Feet Assistive device: Rolling walker (2 wheeled) Gait Pattern/deviations: Step-to pattern        Stairs            Wheelchair Mobility    Modified Rankin (Stroke Patients Only)       Balance Overall balance assessment: Modified Independent Sitting-balance support: No upper extremity supported;Feet supported Sitting balance-Leahy Scale: Normal     Standing balance support: No upper extremity supported Standing balance-Leahy Scale: Good                               Pertinent Vitals/Pain Pain Assessment: Faces Faces Pain Scale: Hurts a little bit Pain Location:  (everywhere)    Home Living Family/patient expects to be discharged to:: Other (Comment) (Needs assesment for discharge placement) Living Arrangements: Parent Available Help at Discharge: Family Type of Home: House Home Access: Stairs to enter Entrance Stairs-Rails: None Entrance Stairs-Number of Steps: 1 Home Layout: One level        Prior Function Level of Independence: Independent with assistive device(s)   Gait / Transfers Assistance Needed: RW/spc           Hand Dominance        Extremity/Trunk Assessment  Communication   Communication: No difficulties  Cognition Arousal/Alertness: Awake/alert Behavior During Therapy: WFL for tasks assessed/performed Overall Cognitive Status: Within Functional Limits for tasks assessed                                        General Comments      Exercises     Assessment/Plan    PT Assessment Patient needs continued PT services  PT Problem List Decreased strength;Decreased activity tolerance;Decreased  mobility;Pain       PT Treatment Interventions Gait training;Therapeutic activities;Therapeutic exercise    PT Goals (Current goals can be found in the Care Plan section)  Acute Rehab PT Goals Patient Stated Goal: no goals stated PT Goal Formulation: Patient unable to participate in goal setting Time For Goal Achievement: 06/05/20 Potential to Achieve Goals: Fair    Frequency Min 2X/week   Barriers to discharge        Co-evaluation               AM-PAC PT "6 Clicks" Mobility  Outcome Measure Help needed turning from your back to your side while in a flat bed without using bedrails?: None Help needed moving from lying on your back to sitting on the side of a flat bed without using bedrails?: None Help needed moving to and from a bed to a chair (including a wheelchair)?: A Little Help needed standing up from a chair using your arms (e.g., wheelchair or bedside chair)?: A Little Help needed to walk in hospital room?: A Little Help needed climbing 3-5 steps with a railing? : A Little 6 Click Score: 20    End of Session   Activity Tolerance: Patient tolerated treatment well Patient left: in bed Nurse Communication: Mobility status PT Visit Diagnosis: Muscle weakness (generalized) (M62.81);History of falling (Z91.81);Difficulty in walking, not elsewhere classified (R26.2)    Time: 8343-7357 PT Time Calculation (min) (ACUTE ONLY): 25 min   Charges:   PT Evaluation $PT Eval Low Complexity: 1 Low PT Treatments $Gait Training: 8-22 mins          Alanson Puls, PT DPT 05/22/2020, 12:42 PM

## 2020-05-22 NOTE — Progress Notes (Signed)
   05/22/20 0300  Assess: MEWS Score  BP (!) 89/58  Pulse Rate 75  Resp 18  Level of Consciousness Alert  SpO2 99 %  O2 Device Room Air  Assess: MEWS Score  MEWS Temp 0  MEWS Systolic 1  MEWS Pulse 0  MEWS RR 0  MEWS LOC 0  MEWS Score 1  MEWS Score Color Green  Document  Patient Outcome Stabilized after interventions;Other (Comment) (patient not symptomatic, has chronic hypotension, bp recheck)  Progress note created (see row info) Yes

## 2020-05-23 ENCOUNTER — Emergency Department
Admission: EM | Admit: 2020-05-23 | Discharge: 2020-05-23 | Disposition: A | Discharge status: Skilled Nursing Facility | Payer: Medicaid Other | Attending: Emergency Medicine | Admitting: Emergency Medicine

## 2020-05-23 DIAGNOSIS — Z859 Personal history of malignant neoplasm, unspecified: Secondary | ICD-10-CM | POA: Insufficient documentation

## 2020-05-23 DIAGNOSIS — Z79899 Other long term (current) drug therapy: Secondary | ICD-10-CM | POA: Insufficient documentation

## 2020-05-23 DIAGNOSIS — R531 Weakness: Secondary | ICD-10-CM

## 2020-05-23 DIAGNOSIS — Z139 Encounter for screening, unspecified: Secondary | ICD-10-CM

## 2020-05-23 DIAGNOSIS — G894 Chronic pain syndrome: Secondary | ICD-10-CM

## 2020-05-23 DIAGNOSIS — K9433 Esophagostomy malfunction: Secondary | ICD-10-CM | POA: Insufficient documentation

## 2020-05-23 DIAGNOSIS — I959 Hypotension, unspecified: Secondary | ICD-10-CM

## 2020-05-23 DIAGNOSIS — Z87891 Personal history of nicotine dependence: Secondary | ICD-10-CM | POA: Insufficient documentation

## 2020-05-23 DIAGNOSIS — R338 Other retention of urine: Secondary | ICD-10-CM

## 2020-05-23 LAB — CBC
HCT: 32.2 % — ABNORMAL LOW (ref 39.0–52.0)
Hemoglobin: 10.4 g/dL — ABNORMAL LOW (ref 13.0–17.0)
MCH: 30 pg (ref 26.0–34.0)
MCHC: 32.3 g/dL (ref 30.0–36.0)
MCV: 92.8 fL (ref 80.0–100.0)
Platelets: 188 10*3/uL (ref 150–400)
RBC: 3.47 MIL/uL — ABNORMAL LOW (ref 4.22–5.81)
RDW: 16.5 % — ABNORMAL HIGH (ref 11.5–15.5)
WBC: 13.1 10*3/uL — ABNORMAL HIGH (ref 4.0–10.5)
nRBC: 0 % (ref 0.0–0.2)

## 2020-05-23 LAB — BASIC METABOLIC PANEL
Anion gap: 6 (ref 5–15)
BUN: 21 mg/dL — ABNORMAL HIGH (ref 6–20)
CO2: 33 mmol/L — ABNORMAL HIGH (ref 22–32)
Calcium: 9.3 mg/dL (ref 8.9–10.3)
Chloride: 104 mmol/L (ref 98–111)
Creatinine, Ser: 0.77 mg/dL (ref 0.61–1.24)
GFR calc Af Amer: >60 mL/min (ref >60)
GFR calc non Af Amer: >60 mL/min (ref >60)
Glucose, Bld: 101 mg/dL — ABNORMAL HIGH (ref 70–99)
Potassium: 4.3 mmol/L (ref 3.5–5.1)
Sodium: 143 mmol/L (ref 135–145)

## 2020-05-23 LAB — GLUCOSE, CAPILLARY
Glucose-Capillary: 119 mg/dL — ABNORMAL HIGH (ref 70–99)
Glucose-Capillary: 96 mg/dL (ref 70–99)
Glucose-Capillary: 88 mg/dL (ref 70–99)

## 2020-05-23 LAB — LACTIC ACID, PLASMA
Lactic Acid, Venous: 1.2 mmol/L (ref 0.5–1.9)
Lactic Acid, Venous: 1.9 mmol/L (ref 0.5–1.9)

## 2020-05-23 LAB — PHOSPHORUS: Phosphorus: 1.9 mg/dL — ABNORMAL LOW (ref 2.5–4.6)

## 2020-05-23 MED ORDER — IBUPROFEN 400 MG PO TABS: 800.0000 mg | ORAL_TABLET | Freq: Once | ORAL | Status: DC

## 2020-05-23 MED ORDER — MORPHINE SULFATE (PF) 2 MG/ML IV SOLN
2.0000 mg | Freq: Once | INTRAVENOUS | Status: AC
  Administered 2020-05-23: 2 mg via INTRAMUSCULAR
  Filled 2020-05-23: qty 1

## 2020-05-23 MED ORDER — CEPHALEXIN 500 MG PO CAPS: 500.0000 mg | ORAL_CAPSULE | Freq: Four times a day (QID) | ORAL | 0 refills | Status: AC

## 2020-05-23 MED ORDER — ALBUMIN HUMAN 25 % IV SOLN
25.0000 g | Freq: Once | INTRAVENOUS | Status: AC
  Administered 2020-05-23: 25 g via INTRAVENOUS
  Filled 2020-05-23: qty 100

## 2020-05-23 MED ORDER — KETOROLAC TROMETHAMINE 30 MG/ML IJ SOLN
30.0000 mg | Freq: Once | INTRAMUSCULAR | Status: AC
  Administered 2020-05-23: 30 mg via INTRAVENOUS
  Filled 2020-05-23: qty 1

## 2020-05-23 MED ORDER — ACETAMINOPHEN 325 MG PO TABS
650.0000 mg | ORAL_TABLET | Freq: Once | ORAL | Status: AC
  Administered 2020-05-23: 650 mg via ORAL
  Filled 2020-05-23: qty 2

## 2020-05-23 MED ORDER — MIDODRINE HCL 10 MG PO TABS: 10.0000 mg | ORAL_TABLET | Freq: Three times a day (TID) | ORAL | 0 refills | Status: DC

## 2020-05-23 MED ORDER — HYDROCODONE-ACETAMINOPHEN 7.5-325 MG/15ML PO SOLN: 10.0000 mL | Freq: Four times a day (QID) | ORAL | 0 refills | Status: DC | PRN

## 2020-05-23 MED ORDER — IBUPROFEN 400 MG PO TABS: 600.0000 mg | ORAL_TABLET | Freq: Once | ORAL | Status: DC

## 2020-05-23 MED ORDER — MIDODRINE HCL 5 MG PO TABS
10.0000 mg | ORAL_TABLET | Freq: Four times a day (QID) | ORAL | Status: DC
  Administered 2020-05-23 (×2): 10 mg via ORAL
  Filled 2020-05-23 (×5): qty 2
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
## 2020-05-23 NOTE — Consult Note (Signed)
[facility] Nurse ostomy follow up POuch is intact.  Using 2 piece urostomy pouch at this time.  States one piece is leaking. Continues to drink despite fluid restriction and leaking around Feeding tube and spit fistula.  Patient has supplies in room and on unit in supply room.  No needs today.  Will not follow at this time.  Please re-consult if needed.  Domenic Moras MSN, RN, FNP-BC CWON Wound, Ostomy, Continence Nurse Pager [phone]

## 2020-05-23 NOTE — ED Notes (Signed)
Pt bag reapplied by this nurse

## 2020-05-23 NOTE — Discharge Instructions (Signed)
PEG tube feeding per protocol  PLEASE TAKE BP ON THE RIGHT LEG TO GET ACCURATE READINGS  Restrict oral fluid intake upto 1500cc per day

## 2020-05-23 NOTE — Progress Notes (Signed)
Consistently notified Sharion Settler, NP of ongoing hypotension throughout the shift. Pt was given two doses of albumin, and an increased dose of midodrine. Notified NP of most recent BP of 86/51.

## 2020-05-23 NOTE — ED Triage Notes (Signed)
Pt comes from [facility] due to his esophagostomy bag coming off of his person. Pt is reported to have eaten jello which he was not suppose to have and the bag came off. Pt arrives alert, oriented x 4 and able to move from stretcher to bed. [facility] sent pt to have bag replaced.

## 2020-05-23 NOTE — Progress Notes (Addendum)
Cross Cover Brief Note Nurse reported hypotension ongoing.   Bedside patient asymptomatic.  Reports back/buttocks pain.No improvement after initial albumin dose ordered. Additional albumin ordered as well as increased midodrine dose. Narcotic pain meds held secondary to hypotension AM labs revealed leukocytosis.  Repeat blood and urine cultures. (blod cultures 6/112 +, 6/14 no growth so far.  Repeat  lactate level = 1.2 Some improvement in blood pressure post 2nd dose IV albumin.  Will need monitored closely and transfer to ICU for pressors if warranted

## 2020-05-23 NOTE — Plan of Care (Signed)
The patient has been discharged to [facility] and has been transfer by his mother to the SNF. IV removed. No falls. The ostomy bag has been changed today. Report was called to the facility. Nurse Theron Arista has been provided with the information and tube feed orders as well as flush per PEG tube.  Problem: Nutrition Goal: Patient maintains adequate hydration Outcome: Completed/Met Goal: Patient maintains weight Outcome: Completed/Met Goal: Patient/Family demonstrates understanding of diet Outcome: Completed/Met Goal: Patient/Family independently completes tube feeding Outcome: Completed/Met Goal: Patient will have no more than 5 lb weight change during LOS Outcome: Completed/Met Goal: Patient will utilize adaptive techniques to administer nutrition Outcome: Completed/Met Goal: Patient will verbalize dietary restrictions Outcome: Completed/Met   Problem: Education: Goal: Knowledge of General Education information will improve Description: Including pain rating scale, medication(s)/side effects and non-pharmacologic comfort measures Outcome: Completed/Met   Problem: Health Behavior/Discharge Planning: Goal: Ability to manage health-related needs will improve Outcome: Completed/Met   Problem: Clinical Measurements: Goal: Ability to maintain clinical measurements within normal limits will improve Outcome: Completed/Met Goal: Will remain free from infection Outcome: Completed/Met Goal: Diagnostic test results will improve Outcome: Completed/Met Goal: Respiratory complications will improve Outcome: Completed/Met Goal: Cardiovascular complication will be avoided Outcome: Completed/Met   Problem: Activity: Goal: Risk for activity intolerance will decrease Outcome: Completed/Met   Problem: Nutrition: Goal: Adequate nutrition will be maintained Outcome: Completed/Met   Problem: Coping: Goal: Level of anxiety will decrease Outcome: Completed/Met   Problem:  Elimination: Goal: Will not experience complications related to bowel motility Outcome: Completed/Met Goal: Will not experience complications related to urinary retention Outcome: Completed/Met   Problem: Pain Managment: Goal: General experience of comfort will improve Outcome: Completed/Met   Problem: Safety: Goal: Ability to remain free from injury will improve Outcome: Completed/Met   Problem: Skin Integrity: Goal: Risk for impaired skin integrity will decrease Outcome: Completed/Met

## 2020-05-23 NOTE — Plan of Care (Signed)
  Problem: Nutrition Goal: Patient maintains adequate hydration Outcome: Completed/Met Goal: Patient maintains weight Outcome: Completed/Met Goal: Patient/Family demonstrates understanding of diet Outcome: Completed/Met Goal: Patient/Family independently completes tube feeding Outcome: Completed/Met Goal: Patient will have no more than 5 lb weight change during LOS Outcome: Completed/Met Goal: Patient will utilize adaptive techniques to administer nutrition Outcome: Completed/Met Goal: Patient will verbalize dietary restrictions Outcome: Completed/Met   Problem: Malnutrition  (NI-5.2) Goal: Food and/or nutrient delivery Description: Individualized approach for food/nutrient provision. Outcome: Completed/Met   Problem: Education: Goal: Knowledge of General Education information will improve Description: Including pain rating scale, medication(s)/side effects and non-pharmacologic comfort measures Outcome: Completed/Met   Problem: Health Behavior/Discharge Planning: Goal: Ability to manage health-related needs will improve Outcome: Completed/Met   Problem: Clinical Measurements: Goal: Ability to maintain clinical measurements within normal limits will improve Outcome: Completed/Met Goal: Will remain free from infection Outcome: Completed/Met Goal: Diagnostic test results will improve Outcome: Completed/Met Goal: Respiratory complications will improve Outcome: Completed/Met Goal: Cardiovascular complication will be avoided Outcome: Completed/Met   Problem: Activity: Goal: Risk for activity intolerance will decrease Outcome: Completed/Met   Problem: Nutrition: Goal: Adequate nutrition will be maintained Outcome: Completed/Met   Problem: Coping: Goal: Level of anxiety will decrease Outcome: Completed/Met   Problem: Elimination: Goal: Will not experience complications related to bowel motility Outcome: Completed/Met Goal: Will not experience complications related to  urinary retention Outcome: Completed/Met   Problem: Pain Managment: Goal: General experience of comfort will improve Outcome: Completed/Met   Problem: Safety: Goal: Ability to remain free from injury will improve Outcome: Completed/Met   Problem: Acute Rehab PT Goals(only PT should resolve) Goal: Pt Will Ambulate Outcome: Completed/Met Goal: Pt Will Go Up/Down Stairs Outcome: Completed/Met

## 2020-05-23 NOTE — Progress Notes (Signed)
   05/23/20 0802  Assess: MEWS Score  Temp 98 F (36.7 C)  BP (!) 70/51  Pulse Rate 67  Resp 18  Level of Consciousness Alert  SpO2 98 %  O2 Device Room Air  Assess: MEWS Score  MEWS Temp 0  MEWS Systolic 2  MEWS Pulse 0  MEWS RR 0  MEWS LOC 0  MEWS Score 2  MEWS Score Color Yellow  Assess: if the MEWS score is Yellow or Red  Were vital signs taken at a resting state? Yes  Focused Assessment Documented focused assessment  Early Detection of Sepsis Score *See Row Information* Low  MEWS guidelines implemented *See Row Information* Yes  Treat  MEWS Interventions Other (Comment) (MD notified)  Take Vital Signs  Increase Vital Sign Frequency  Yellow: Q 2hr X 2 then Q 4hr X 2, if remains yellow, continue Q 4hrs  Escalate  MEWS: Escalate Yellow: discuss with charge nurse/RN and consider discussing with provider and RRT  Notify: Charge Nurse/RN  Name of Charge Nurse/RN Notified Candelaria Celeste, RN  Date Charge Nurse/RN Notified 05/23/20  Time Charge Nurse/RN Notified 0802  Notify: Provider  Provider Name/Title Fritzi Mandes  Date Provider Notified 05/23/20  Time Provider Notified 0820  Notification Type Page  Notification Reason Change in status  Response Other (Comment) (awaiting response)  Date of Provider Response 05/23/20  Notify: Rapid Response  Name of Rapid Response RN Notified  (no)  Document  Patient Outcome Transferred/level of care increased  Progress note created (see row info) Yes  Charge nurse and MD notified about change in status and yellow MEWS score

## 2020-05-23 NOTE — TOC Transition Note (Signed)
Transition of Care [facility]) - CM/SW Discharge Note   Patient Details  Name: Thomas [name] MRN: [id number] Date of Birth: 07/31/1971  [facility] CM/SW Contact:  Beverly Sessions, RN Phone Number: 05/23/2020, 1:54 PM   Clinical Narrative:    Mom at bedside.  Bed offer of [place] care presented. Bed offer accepted Patient aware that this is for long term placement, which means in order for Medicaid to pay for his stay he must atleast stay 30 days.   Mother to transport at discharge DC info sent in the [place] Bedside RN provided with number to call report  Corene Cornea with [facility] notified    Final next level of care: Long Term Nursing Home Barriers to Discharge: No Barriers Identified   Patient Goals and CMS Choice        Discharge Placement              Patient chooses bed at: [facility] Patient to be transferred to facility by: mom via car      Discharge Plan and Services   Discharge Planning Services: CM Consult                                 Social Determinants of Health (SDOH) Interventions     Readmission Risk Interventions Readmission Risk Prevention Plan 05/23/2020 05/22/2020 04/05/2020  Transportation Screening - Complete Complete  PCP or Specialist Appt within 3-5 Days - - -  [place] or [place] Work Consult for [place] - - -  Medication Review Press photographer) - Complete Complete  PCP or Specialist appointment within 3-5 days of discharge (No Data) - -  [place] or [place] - Complete Complete  [place] - Complete Not Applicable  Some recent data might be hidden

## 2020-05-25 LAB — CULTURE, BLOOD (ROUTINE X 2)
Culture: NO GROWTH
Culture: NO GROWTH
Special Requests: ADEQUATE

## 2020-05-28 LAB — CULTURE, BLOOD (ROUTINE X 2)
Culture: NO GROWTH
Culture: NO GROWTH
Special Requests: ADEQUATE

## 2020-05-29 ENCOUNTER — Ambulatory Visit: Payer: Medicaid Other | Admitting: Urology

## 2020-05-29 ENCOUNTER — Other Ambulatory Visit: Payer: Self-pay

## 2020-05-29 ENCOUNTER — Encounter: Payer: Self-pay | Admitting: Urology

## 2020-05-29 VITALS — BP 97/61 | HR 70 | Ht 65.0 in | Wt 80.0 lb

## 2020-05-29 DIAGNOSIS — R339 Retention of urine, unspecified: Secondary | ICD-10-CM

## 2020-05-29 MED ORDER — TAMSULOSIN HCL 0.4 MG PO CAPS: 0.4000 mg | ORAL_CAPSULE | Freq: Every day | ORAL | 3 refills | Status: DC

## 2020-05-29 NOTE — Progress Notes (Signed)
Scheduled for cystoscopy today however urine nitrite positive with >30 WBCs. Cystoscopy was not performed and urine sent for culture.  He is complaining of dysuria.  He requested refill tamsulosin as he states he urinates much better when taking this medication.  Rx sent

## 2020-05-30 LAB — MICROSCOPIC EXAMINATION: WBC, UA: >30 /hpf — AB (ref 0–5)

## 2020-05-30 LAB — URINALYSIS, COMPLETE
Bilirubin, UA: NEGATIVE
Glucose, UA: NEGATIVE
Nitrite, UA: NEGATIVE
Specific Gravity, UA: 1.02 (ref 1.005–1.030)
Urobilinogen, Ur: 2 mg/dL — ABNORMAL HIGH (ref 0.2–1.0)
pH, UA: 8.5 — ABNORMAL HIGH (ref 5.0–7.5)

## 2020-06-03 LAB — CULTURE, URINE COMPREHENSIVE

## 2020-06-07 ENCOUNTER — Telehealth: Payer: Self-pay

## 2020-06-07 ENCOUNTER — Other Ambulatory Visit: Payer: Self-pay | Admitting: Urology

## 2020-06-07 ENCOUNTER — Telehealth: Payer: Self-pay | Admitting: Family Medicine

## 2020-06-07 IMAGING — DX DG CHEST 1V PORT
1 series · 2 of 2 positions shown · non-contrast
Comparison: CT 06/11/2019.  Chest x-ray 06/11/2019.

CLINICAL DATA: Chest pain.

EXAM:
PORTABLE CHEST 1 VIEW

[Series 1: chest ap · 0.14mm/px · 2 of 2 slices shown]
[im 1/2]
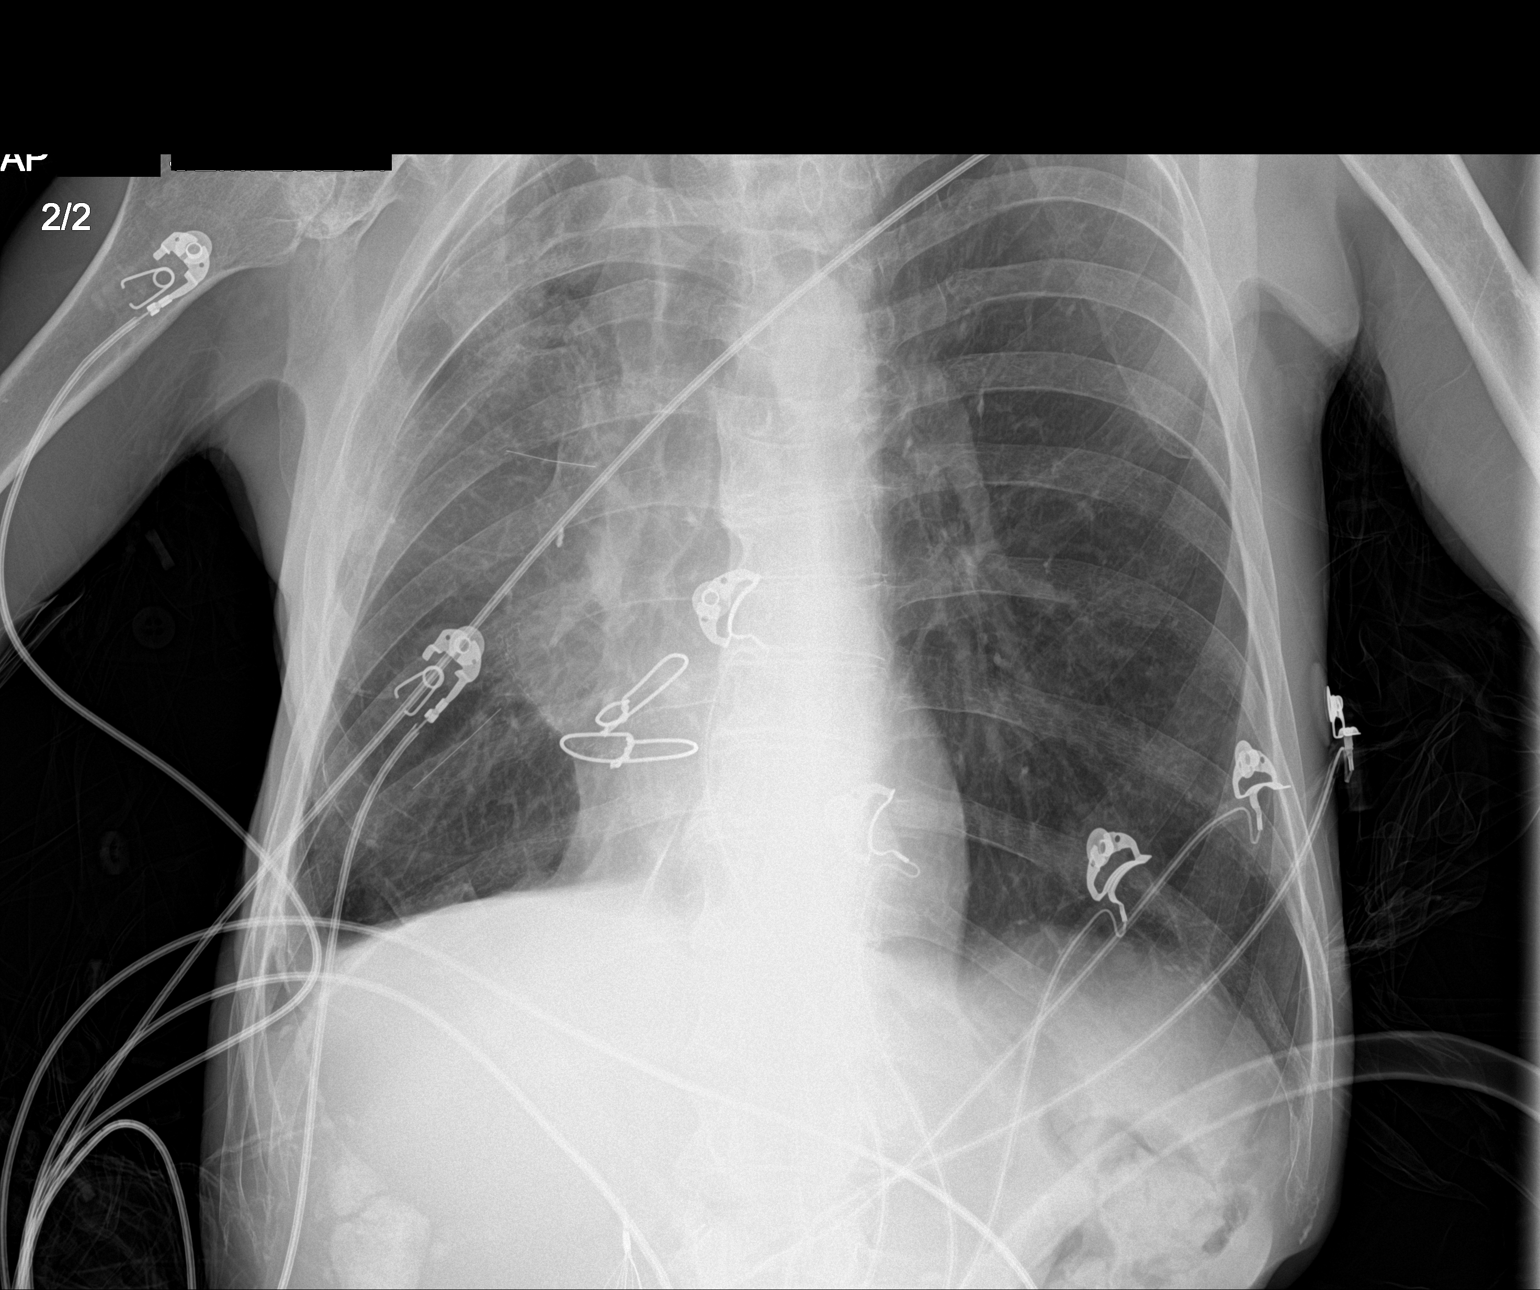
[im 2/2]
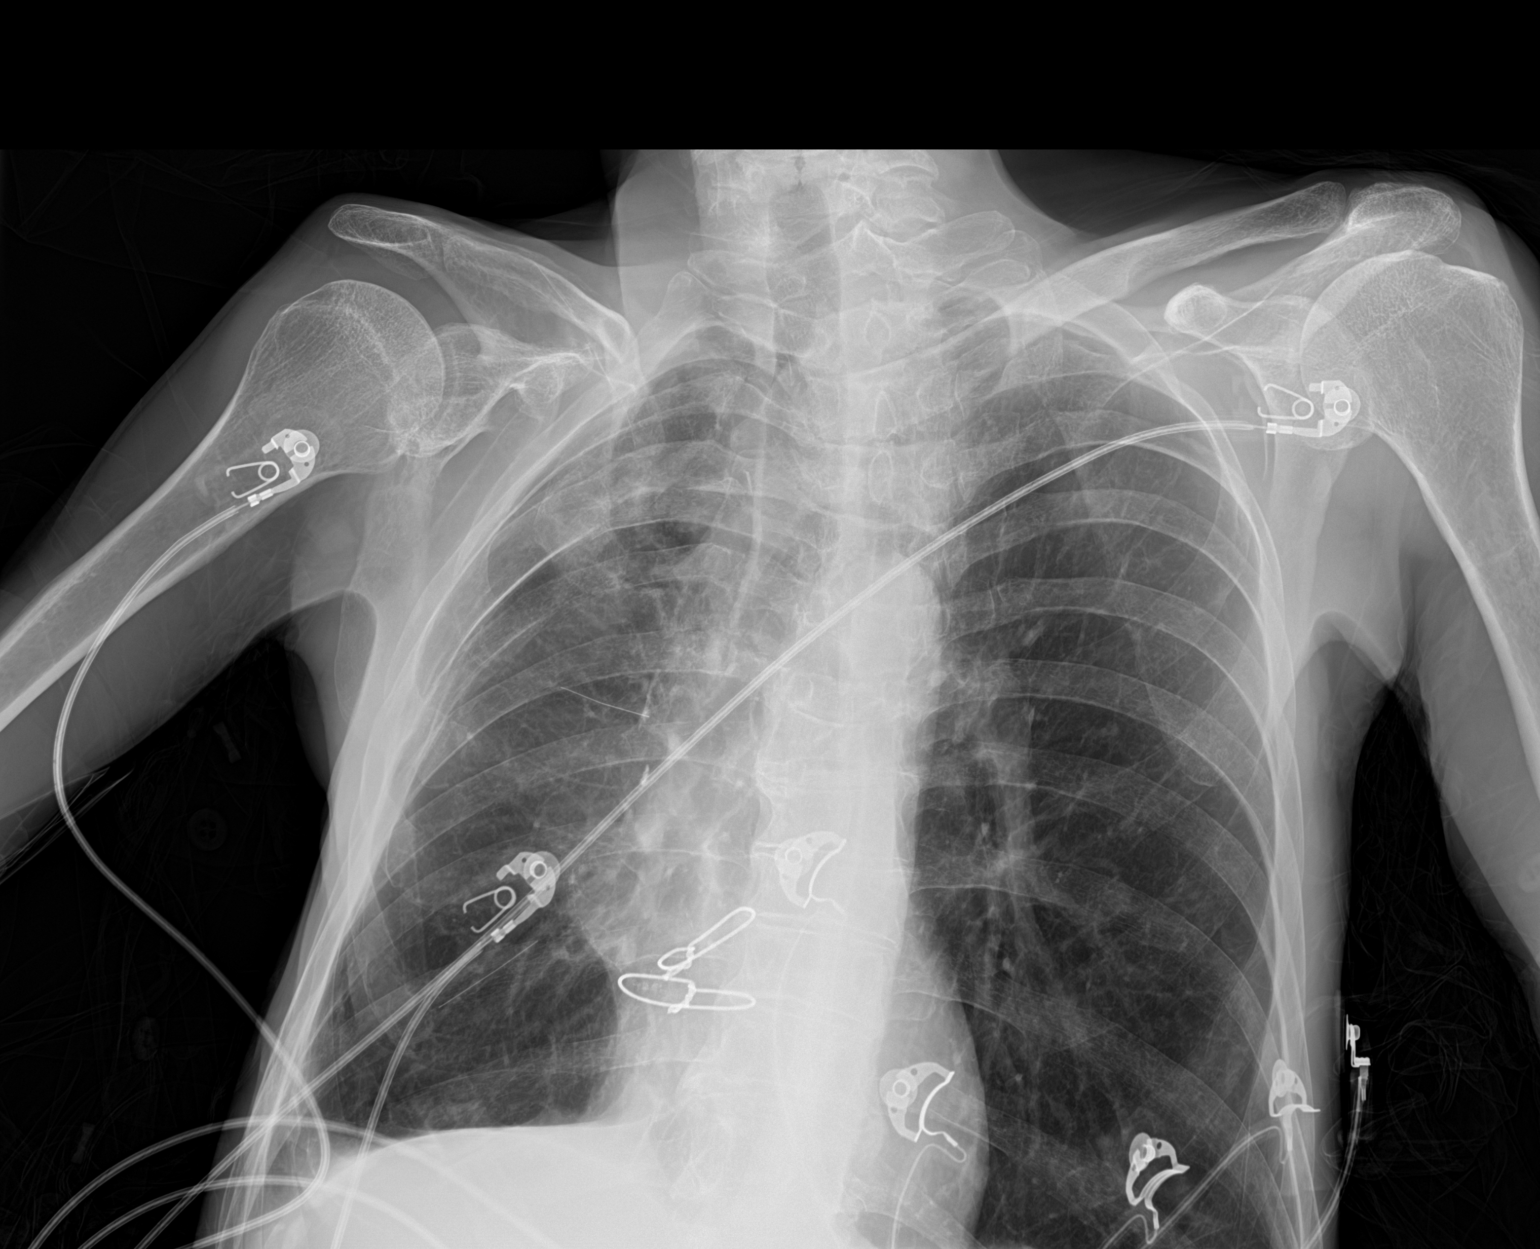

[2 of 2 positions shown; findings below may reference images not displayed]

FINDINGS: Mediastinum and hilar structures are stable. Prior colonic
interposition. Stable atelectatic changes and or pleuroparenchymal
scarring noted on the right. Stable metallic linear densities noted
over the right chest. No pleural effusion or pneumothorax. No acute
bony abnormality identified.
IMPRESSION: 1. Mediastinum and hilar structures stable. Prior colonic
interposition.

2. Stable atelectatic changes and or pleuroparenchymal scarring
noted on the right.

3. Stable metallic linear densities noted over the right chest. No
pneumothorax.

## 2020-06-07 IMAGING — CT CT RENAL STONE PROTOCOL
2 of 4 series · 16 of 46 positions shown, 18 images · non-contrast
Comparison: 01/16/2019

CLINICAL DATA: Flank pain, kidney stones suspected

EXAM:
CT ABDOMEN AND PELVIS WITHOUT CONTRAST
TECHNIQUE: Multidetector CT imaging of the abdomen and pelvis was performed
following the standard protocol without IV contrast.

[Series 3: renal stone 5.0 · axial · 0.65mm/px · z∈[+709,+1079]mm · 13 of 82 slices shown, 15 images]
[im 4/82  soft-tissue]
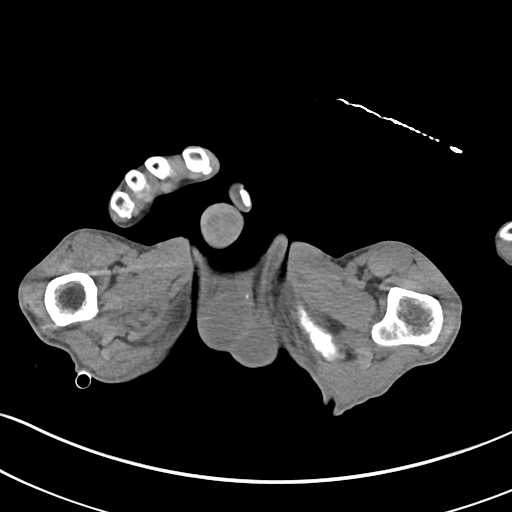
[im 4/82  bone]
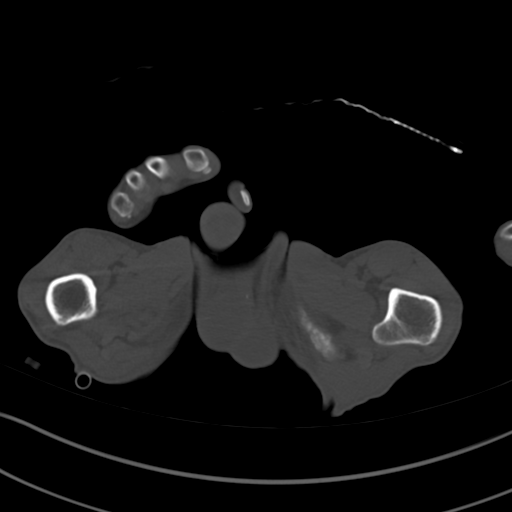
[im 11/82  soft-tissue]
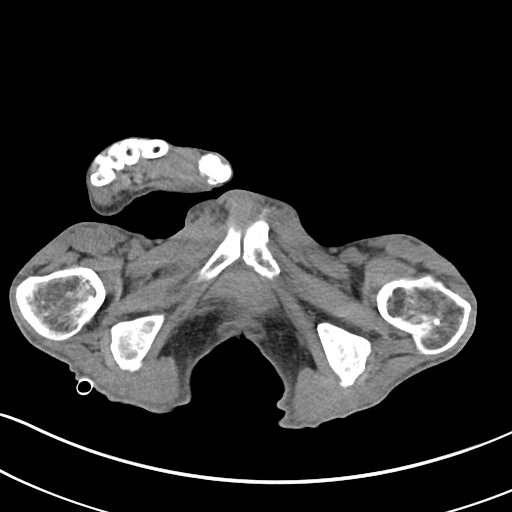
[im 17/82  soft-tissue]
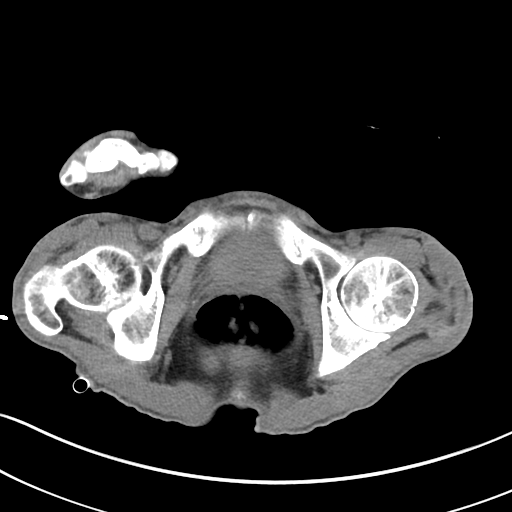
[im 24/82  soft-tissue]
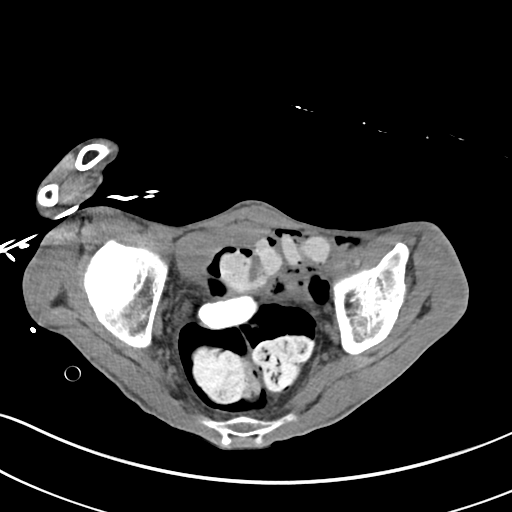
[im 28/82  soft-tissue]
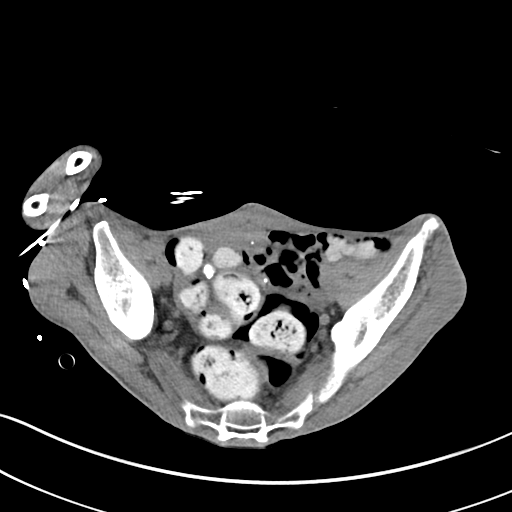
[im 34/82  soft-tissue]
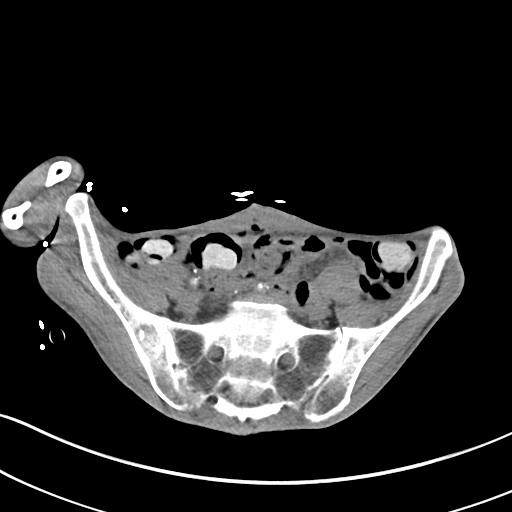
[im 41/82  soft-tissue]
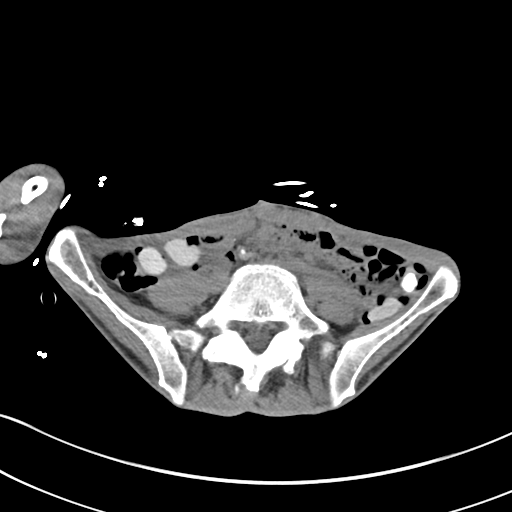
[im 48/82  soft-tissue]
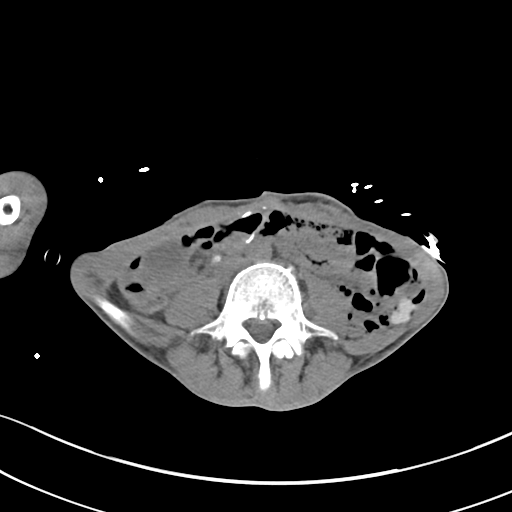
[im 55/82  soft-tissue]
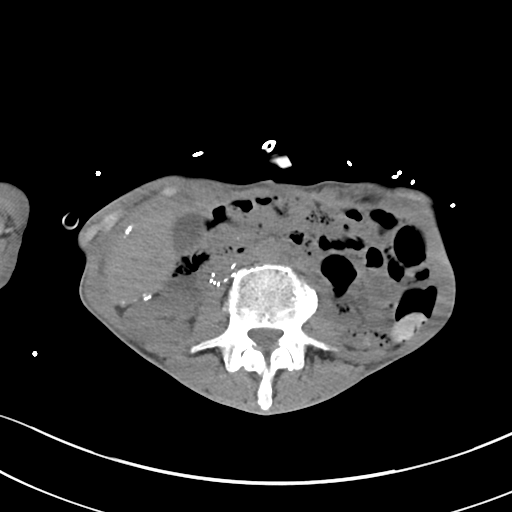
[im 55/82  bone]
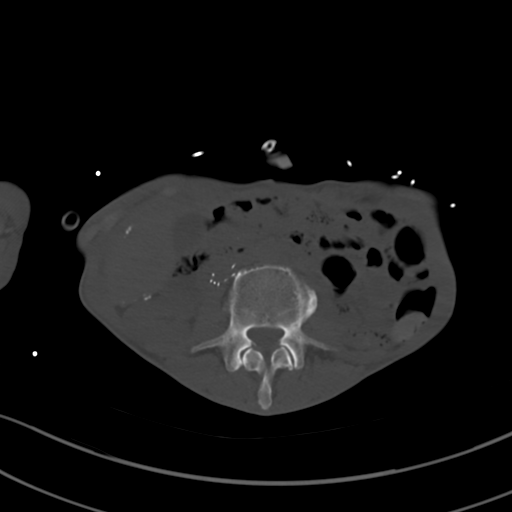
[im 58/82  soft-tissue]
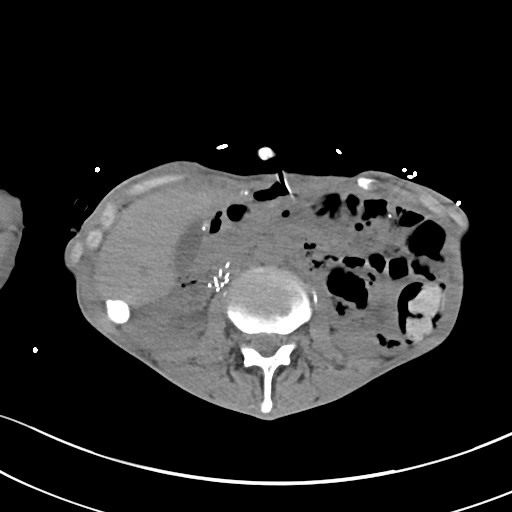
[im 65/82  soft-tissue]
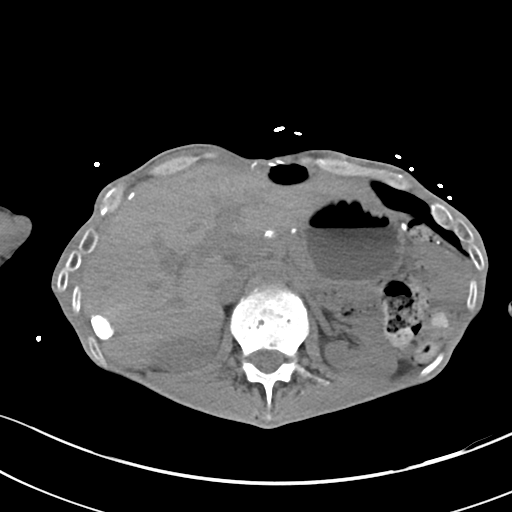
[im 71/82  soft-tissue]
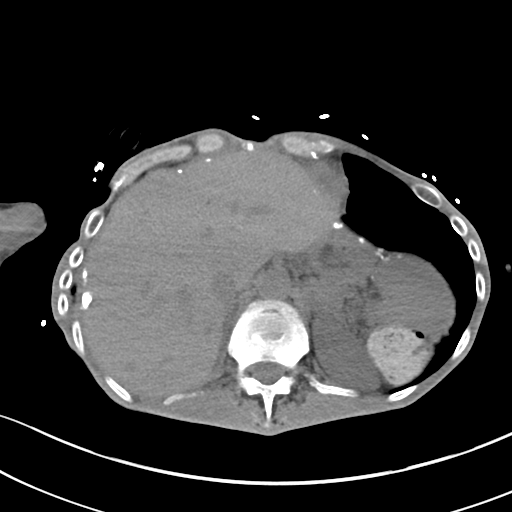
[im 78/82  soft-tissue]
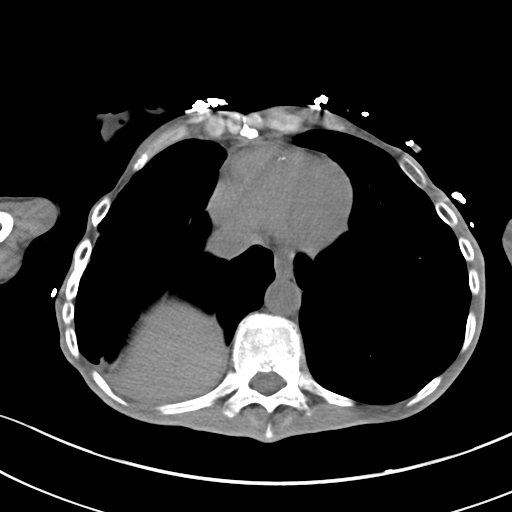

[Series 6: coronal · coronal · 0.61mm/px · 3 of 76 slices shown]
[im 26/76  soft-tissue]
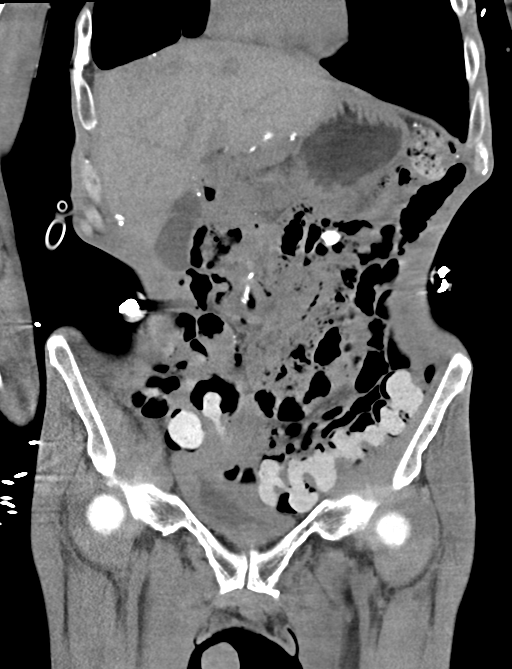
[im 34/76  soft-tissue]
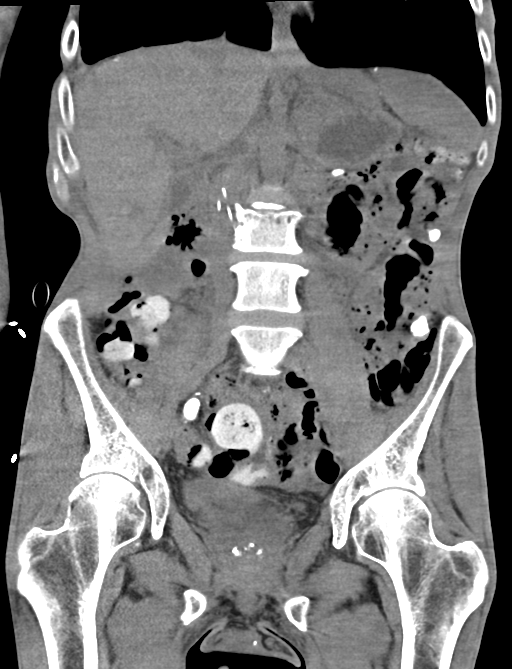
[im 42/76  soft-tissue]
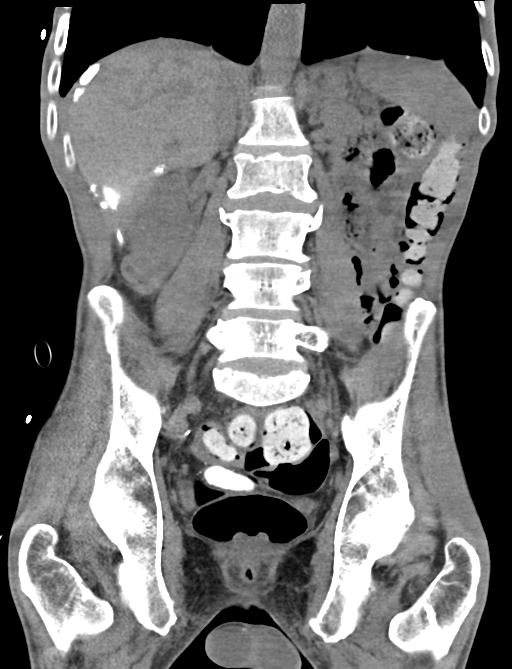

[16 of 46 positions shown; findings below may reference images not displayed]

FINDINGS: Lower chest: No acute abnormality.

Hepatobiliary: No solid liver abnormality is seen. No gallstones,
gallbladder wall thickening, or biliary dilatation.

Pancreas: Unremarkable. No pancreatic ductal dilatation or
surrounding inflammatory changes.

Spleen: Normal in size without significant abnormality.

Adrenals/Urinary Tract: Adrenal glands are unremarkable. Evaluation
for urinary tract calculus is somewhat limited by extreme cachexia.
Within this limitation, there is a new 3 mm calculus inferior to the
right kidney in the expected vicinity of the proximal ureter (series
3, image 33). No other evidence of urinary tract calculus. No
high-grade hydronephrosis. Thickening of the decompressed urinary
bladder.

Stomach/Bowel: Percutaneous gastrostomy tube. Large burden of stool
dense stool balls throughout the colon and rectum. Postoperative
findings of colonic interposition, partially imaged.

Vascular/Lymphatic: Infrarenal IVC filter. No enlarged abdominal or
pelvic lymph nodes.

Reproductive: No mass or other significant abnormality.

Other: No abdominal wall hernia or abnormality. No abdominopelvic
ascites. Multiple dense calcifications throughout the abdomen and
pelvis.

Musculoskeletal: Extreme cachexia.
IMPRESSION: 1. Evaluation for urinary tract calculus is somewhat limited by
extreme cachexia, within this limitation, there is a new 3 mm
calculus inferior to the right kidney in the expected vicinity of
the proximal ureter. No other evidence of urinary tract calculus and
no high-grade hydronephrosis.

2. Nonspecific thickening of the decompressed urinary bladder.
Correlate with urinalysis.

3. Postoperative findings of the bowel, including of colonic
interposition.

## 2020-06-07 MED ORDER — FLUCONAZOLE 100 MG PO TABS: 100.0000 mg | ORAL_TABLET | Freq: Every day | ORAL | 0 refills | Status: DC

## 2020-06-07 MED ORDER — CEPHALEXIN 500 MG PO CAPS: 500.0000 mg | ORAL_CAPSULE | Freq: Two times a day (BID) | ORAL | 0 refills | Status: AC

## 2020-06-07 NOTE — Telephone Encounter (Signed)
-----   Message from Abbie Sons, MD sent at 06/07/2020 10:30 AM EDT ----- Urine culture grew yeast and bacteria.  Prescriptions were sent to pharmacy.  Can reschedule cystoscopy.

## 2020-06-07 NOTE — Telephone Encounter (Signed)
Spoke to [place] at the nursing facility and gave a verbal order for the ABX Keflex and Diflucan. Also Cystoscopy appointment was made.

## 2020-06-07 NOTE — Telephone Encounter (Signed)
Patient notified, he states this was previously addressed see other telephone encounter

## 2020-06-20 ENCOUNTER — Telehealth: Payer: Self-pay

## 2020-06-20 ENCOUNTER — Other Ambulatory Visit: Payer: Self-pay

## 2020-06-20 ENCOUNTER — Emergency Department
Admission: EM | Admit: 2020-06-20 | Discharge: 2020-06-20 | Disposition: A | Discharge status: Skilled Nursing Facility | Payer: Medicaid Other | Attending: Emergency Medicine | Admitting: Emergency Medicine

## 2020-06-20 ENCOUNTER — Ambulatory Visit: Payer: Self-pay | Admitting: Physician Assistant

## 2020-06-20 DIAGNOSIS — Z87891 Personal history of nicotine dependence: Secondary | ICD-10-CM | POA: Insufficient documentation

## 2020-06-20 DIAGNOSIS — Z859 Personal history of malignant neoplasm, unspecified: Secondary | ICD-10-CM | POA: Diagnosis not present

## 2020-06-20 DIAGNOSIS — R338 Other retention of urine: Secondary | ICD-10-CM

## 2020-06-20 DIAGNOSIS — R339 Retention of urine, unspecified: Secondary | ICD-10-CM | POA: Diagnosis not present

## 2020-06-20 LAB — URINALYSIS, COMPLETE (UACMP) WITH MICROSCOPIC
Bacteria, UA: NONE SEEN
Bilirubin Urine: NEGATIVE
Glucose, UA: NEGATIVE mg/dL
Hgb urine dipstick: NEGATIVE
Ketones, ur: NEGATIVE mg/dL
Leukocytes,Ua: NEGATIVE
Nitrite: NEGATIVE
Protein, ur: 30 mg/dL — AB
RBC / HPF: >50 RBC/hpf — ABNORMAL HIGH (ref 0–5)
Specific Gravity, Urine: 1.021 (ref 1.005–1.030)
Squamous Epithelial / HPF: NONE SEEN (ref 0–5)
pH: 7 (ref 5.0–8.0)

## 2020-06-20 MED ORDER — MORPHINE SULFATE (PF) 4 MG/ML IV SOLN
4.0000 mg | Freq: Once | INTRAVENOUS | Status: AC
  Administered 2020-06-20: 4 mg via INTRAMUSCULAR
  Filled 2020-06-20: qty 1

## 2020-06-20 MED ORDER — LIDOCAINE HCL URETHRAL/MUCOSAL 2 % EX GEL
1.0000 "application " | Freq: Once | CUTANEOUS | Status: AC
  Administered 2020-06-20: 1 via URETHRAL
  Filled 2020-06-20: qty 10

## 2020-06-20 NOTE — ED Triage Notes (Signed)
Pt here via [facility] from [facility].   Per facility, pt has been unable to urinate for the past two days. Facility attempted to cath patient but were unsuccessful. Hospice attempted to cath patient but took it out due to pain. Pt report pain in the head of his penis.   EMS VSS- hr 70, o2 sats 94% RA, bp 92/64.  Pt ambulatory with assistance from stretcher to bed.

## 2020-06-20 NOTE — Telephone Encounter (Signed)
Patient's facility called stating patient has not urinated in two days. Attempted in and out cath and there was no urine return. Patient is uncomfortable and having the urge to urinate but cannot. Patient was added on to PA schedule for bladder scan and further evaluation

## 2020-06-20 NOTE — Telephone Encounter (Signed)
Facility called back unable to arrange non emergent transportation. Will attempt an cath again and if unable take to ED, will keep cysto next week
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
## 2020-06-20 NOTE — ED Notes (Signed)
Pt unable to sign for d/c due to no access to topaz signature pad. Pt verbalizes dc instructions with mother at this time. Denies any further needs.

## 2020-06-22 ENCOUNTER — Other Ambulatory Visit: Payer: Self-pay

## 2020-06-22 ENCOUNTER — Inpatient Hospital Stay
Admission: EM | Admit: 2020-06-22 | Discharge: 2020-06-26 | DRG: 393 | Disposition: A | Discharge status: Skilled Nursing Facility | Source: Skilled Nursing Facility | Attending: Internal Medicine | Admitting: Internal Medicine

## 2020-06-22 DIAGNOSIS — Z87440 Personal history of urinary (tract) infections: Secondary | ICD-10-CM

## 2020-06-22 DIAGNOSIS — K9423 Gastrostomy malfunction: Principal | ICD-10-CM

## 2020-06-22 DIAGNOSIS — Z515 Encounter for palliative care: Secondary | ICD-10-CM

## 2020-06-22 DIAGNOSIS — Z880 Allergy status to penicillin: Secondary | ICD-10-CM

## 2020-06-22 DIAGNOSIS — Z87891 Personal history of nicotine dependence: Secondary | ICD-10-CM

## 2020-06-22 DIAGNOSIS — Z681 Body mass index (BMI) 19 or less, adult: Secondary | ICD-10-CM

## 2020-06-22 DIAGNOSIS — I951 Orthostatic hypotension: Secondary | ICD-10-CM | POA: Diagnosis present

## 2020-06-22 DIAGNOSIS — Z886 Allergy status to analgesic agent status: Secondary | ICD-10-CM

## 2020-06-22 DIAGNOSIS — E43 Unspecified severe protein-calorie malnutrition: Secondary | ICD-10-CM

## 2020-06-22 DIAGNOSIS — Z66 Do not resuscitate: Secondary | ICD-10-CM | POA: Diagnosis present

## 2020-06-22 DIAGNOSIS — K219 Gastro-esophageal reflux disease without esophagitis: Secondary | ICD-10-CM | POA: Diagnosis present

## 2020-06-22 DIAGNOSIS — R627 Adult failure to thrive: Secondary | ICD-10-CM | POA: Diagnosis present

## 2020-06-22 DIAGNOSIS — N401 Enlarged prostate with lower urinary tract symptoms: Secondary | ICD-10-CM | POA: Diagnosis present

## 2020-06-22 DIAGNOSIS — Z91018 Allergy to other foods: Secondary | ICD-10-CM

## 2020-06-22 DIAGNOSIS — K5900 Constipation, unspecified: Secondary | ICD-10-CM | POA: Diagnosis present

## 2020-06-22 DIAGNOSIS — L8990 Pressure ulcer of unspecified site, unspecified stage: Secondary | ICD-10-CM | POA: Insufficient documentation

## 2020-06-22 DIAGNOSIS — G8921 Chronic pain due to trauma: Secondary | ICD-10-CM | POA: Diagnosis present

## 2020-06-22 DIAGNOSIS — G629 Polyneuropathy, unspecified: Secondary | ICD-10-CM | POA: Diagnosis present

## 2020-06-22 DIAGNOSIS — E876 Hypokalemia: Secondary | ICD-10-CM

## 2020-06-22 DIAGNOSIS — R64 Cachexia: Secondary | ICD-10-CM | POA: Diagnosis present

## 2020-06-22 DIAGNOSIS — Z79899 Other long term (current) drug therapy: Secondary | ICD-10-CM

## 2020-06-22 DIAGNOSIS — L89151 Pressure ulcer of sacral region, stage 1: Secondary | ICD-10-CM | POA: Diagnosis present

## 2020-06-22 DIAGNOSIS — R338 Other retention of urine: Secondary | ICD-10-CM | POA: Diagnosis present

## 2020-06-22 DIAGNOSIS — Z79891 Long term (current) use of opiate analgesic: Secondary | ICD-10-CM

## 2020-06-22 DIAGNOSIS — Y838 Other surgical procedures as the cause of abnormal reaction of the patient, or of later complication, without mention of misadventure at the time of the procedure: Secondary | ICD-10-CM | POA: Diagnosis present

## 2020-06-22 DIAGNOSIS — R339 Retention of urine, unspecified: Secondary | ICD-10-CM

## 2020-06-22 DIAGNOSIS — Q390 Atresia of esophagus without fistula: Secondary | ICD-10-CM

## 2020-06-22 DIAGNOSIS — K222 Esophageal obstruction: Secondary | ICD-10-CM

## 2020-06-22 DIAGNOSIS — D72829 Elevated white blood cell count, unspecified: Secondary | ICD-10-CM | POA: Diagnosis present

## 2020-06-22 DIAGNOSIS — K9433 Esophagostomy malfunction: Secondary | ICD-10-CM

## 2020-06-22 DIAGNOSIS — F1290 Cannabis use, unspecified, uncomplicated: Secondary | ICD-10-CM | POA: Diagnosis present

## 2020-06-22 DIAGNOSIS — Z20822 Contact with and (suspected) exposure to covid-19: Secondary | ICD-10-CM | POA: Diagnosis present

## 2020-06-22 LAB — URINE CULTURE: Culture: NO GROWTH

## 2020-06-22 NOTE — ED Notes (Signed)
Mouth swabs provided.

## 2020-06-22 NOTE — ED Triage Notes (Signed)
Pt from [facility] with removal of tube that drains liquids from esophagus. Pt also with leaking noted around g tube. Pt with clear drainage noted from esophageal wound and green drainage from g tube. Pt states g tube has leaked since it was placed.
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
## 2020-06-22 NOTE — ED Notes (Signed)
Attempt iv insertion without success.

## 2020-06-23 ENCOUNTER — Encounter: Payer: Self-pay | Admitting: Internal Medicine

## 2020-06-23 ENCOUNTER — Emergency Department

## 2020-06-23 DIAGNOSIS — K9423 Gastrostomy malfunction: Secondary | ICD-10-CM | POA: Diagnosis present

## 2020-06-23 DIAGNOSIS — Z880 Allergy status to penicillin: Secondary | ICD-10-CM | POA: Diagnosis not present

## 2020-06-23 DIAGNOSIS — I951 Orthostatic hypotension: Secondary | ICD-10-CM | POA: Diagnosis present

## 2020-06-23 DIAGNOSIS — K9433 Esophagostomy malfunction: Secondary | ICD-10-CM | POA: Diagnosis present

## 2020-06-23 DIAGNOSIS — Z79891 Long term (current) use of opiate analgesic: Secondary | ICD-10-CM | POA: Diagnosis not present

## 2020-06-23 DIAGNOSIS — N401 Enlarged prostate with lower urinary tract symptoms: Secondary | ICD-10-CM | POA: Diagnosis present

## 2020-06-23 DIAGNOSIS — K5900 Constipation, unspecified: Secondary | ICD-10-CM | POA: Diagnosis present

## 2020-06-23 DIAGNOSIS — R338 Other retention of urine: Secondary | ICD-10-CM | POA: Diagnosis present

## 2020-06-23 DIAGNOSIS — L89151 Pressure ulcer of sacral region, stage 1: Secondary | ICD-10-CM | POA: Diagnosis present

## 2020-06-23 DIAGNOSIS — G629 Polyneuropathy, unspecified: Secondary | ICD-10-CM | POA: Diagnosis present

## 2020-06-23 DIAGNOSIS — K219 Gastro-esophageal reflux disease without esophagitis: Secondary | ICD-10-CM | POA: Diagnosis present

## 2020-06-23 DIAGNOSIS — D72829 Elevated white blood cell count, unspecified: Secondary | ICD-10-CM | POA: Diagnosis present

## 2020-06-23 DIAGNOSIS — E876 Hypokalemia: Secondary | ICD-10-CM

## 2020-06-23 DIAGNOSIS — Z87440 Personal history of urinary (tract) infections: Secondary | ICD-10-CM | POA: Diagnosis not present

## 2020-06-23 DIAGNOSIS — R627 Adult failure to thrive: Secondary | ICD-10-CM | POA: Diagnosis present

## 2020-06-23 DIAGNOSIS — Z886 Allergy status to analgesic agent status: Secondary | ICD-10-CM | POA: Diagnosis not present

## 2020-06-23 DIAGNOSIS — R64 Cachexia: Secondary | ICD-10-CM | POA: Diagnosis present

## 2020-06-23 DIAGNOSIS — R339 Retention of urine, unspecified: Secondary | ICD-10-CM | POA: Diagnosis not present

## 2020-06-23 DIAGNOSIS — Z79899 Other long term (current) drug therapy: Secondary | ICD-10-CM | POA: Diagnosis not present

## 2020-06-23 DIAGNOSIS — Z681 Body mass index (BMI) 19 or less, adult: Secondary | ICD-10-CM | POA: Diagnosis not present

## 2020-06-23 DIAGNOSIS — G8921 Chronic pain due to trauma: Secondary | ICD-10-CM | POA: Diagnosis present

## 2020-06-23 DIAGNOSIS — Z20822 Contact with and (suspected) exposure to covid-19: Secondary | ICD-10-CM | POA: Diagnosis present

## 2020-06-23 DIAGNOSIS — E43 Unspecified severe protein-calorie malnutrition: Secondary | ICD-10-CM | POA: Diagnosis present

## 2020-06-23 DIAGNOSIS — Y838 Other surgical procedures as the cause of abnormal reaction of the patient, or of later complication, without mention of misadventure at the time of the procedure: Secondary | ICD-10-CM | POA: Diagnosis present

## 2020-06-23 DIAGNOSIS — F1290 Cannabis use, unspecified, uncomplicated: Secondary | ICD-10-CM | POA: Diagnosis present

## 2020-06-23 DIAGNOSIS — Z66 Do not resuscitate: Secondary | ICD-10-CM | POA: Diagnosis present

## 2020-06-23 LAB — CBC WITH DIFFERENTIAL/PLATELET
Abs Immature Granulocytes: 0.07 10*3/uL (ref 0.00–0.07)
Basophils Absolute: 0.1 10*3/uL (ref 0.0–0.1)
Basophils Relative: 1 %
Eosinophils Absolute: 0 10*3/uL (ref 0.0–0.5)
Eosinophils Relative: 0 %
HCT: 35.6 % — ABNORMAL LOW (ref 39.0–52.0)
Hemoglobin: 11.2 g/dL — ABNORMAL LOW (ref 13.0–17.0)
Immature Granulocytes: 1 %
Lymphocytes Relative: 11 %
Lymphs Abs: 1.5 10*3/uL (ref 0.7–4.0)
MCH: 30.3 pg (ref 26.0–34.0)
MCHC: 31.5 g/dL (ref 30.0–36.0)
MCV: 96.2 fL (ref 80.0–100.0)
Monocytes Absolute: 0.7 10*3/uL (ref 0.1–1.0)
Monocytes Relative: 5 %
Neutro Abs: 10.9 10*3/uL — ABNORMAL HIGH (ref 1.7–7.7)
Neutrophils Relative %: 82 %
Platelets: 197 10*3/uL (ref 150–400)
RBC: 3.7 MIL/uL — ABNORMAL LOW (ref 4.22–5.81)
RDW: 15.1 % (ref 11.5–15.5)
WBC: 13.3 10*3/uL — ABNORMAL HIGH (ref 4.0–10.5)
nRBC: 0 % (ref 0.0–0.2)

## 2020-06-23 LAB — COMPREHENSIVE METABOLIC PANEL
ALT: 25 U/L (ref 0–44)
AST: 18 U/L (ref 15–41)
Albumin: 3.7 g/dL (ref 3.5–5.0)
Alkaline Phosphatase: 72 U/L (ref 38–126)
Anion gap: 10 (ref 5–15)
BUN: 25 mg/dL — ABNORMAL HIGH (ref 6–20)
CO2: 41 mmol/L — ABNORMAL HIGH (ref 22–32)
Calcium: 9.7 mg/dL (ref 8.9–10.3)
Chloride: 91 mmol/L — ABNORMAL LOW (ref 98–111)
Creatinine, Ser: 0.6 mg/dL — ABNORMAL LOW (ref 0.61–1.24)
GFR calc Af Amer: >60 mL/min (ref >60)
GFR calc non Af Amer: >60 mL/min (ref >60)
Glucose, Bld: 85 mg/dL (ref 70–99)
Potassium: 3 mmol/L — ABNORMAL LOW (ref 3.5–5.1)
Sodium: 142 mmol/L (ref 135–145)
Total Bilirubin: 0.6 mg/dL (ref 0.3–1.2)
Total Protein: 6.8 g/dL (ref 6.5–8.1)

## 2020-06-23 LAB — SARS CORONAVIRUS 2 BY RT PCR (HOSPITAL ORDER, PERFORMED IN ~~LOC~~ HOSPITAL LAB): SARS Coronavirus 2: NEGATIVE

## 2020-06-23 LAB — MAGNESIUM: Magnesium: 2.3 mg/dL (ref 1.7–2.4)

## 2020-06-23 LAB — MRSA PCR SCREENING: MRSA by PCR: NEGATIVE

## 2020-06-23 LAB — PROTIME-INR
INR: 1 (ref 0.8–1.2)
Prothrombin Time: 13 seconds (ref 11.4–15.2)

## 2020-06-23 MED ORDER — POTASSIUM CHLORIDE 10 MEQ/100ML IV SOLN
10.0000 meq | INTRAVENOUS | Status: AC
  Administered 2020-06-23 (×3): 10 meq via INTRAVENOUS
  Filled 2020-06-23: qty 100

## 2020-06-23 MED ORDER — ACETAMINOPHEN 325 MG PO TABS: 650.0000 mg | ORAL_TABLET | Freq: Four times a day (QID) | ORAL | Status: DC | PRN

## 2020-06-23 MED ORDER — SODIUM CHLORIDE 0.9 % IV SOLN: INTRAVENOUS | Status: DC

## 2020-06-23 MED ORDER — HYPROMELLOSE (GONIOSCOPIC) 2.5 % OP SOLN
1.0000 [drp] | Freq: Two times a day (BID) | OPHTHALMIC | Status: DC | PRN
  Filled 2020-06-23: qty 15

## 2020-06-23 MED ORDER — POLYVINYL ALCOHOL 1.4 % OP SOLN
1.0000 [drp] | Freq: Two times a day (BID) | OPHTHALMIC | Status: DC | PRN
  Filled 2020-06-23: qty 15

## 2020-06-23 MED ORDER — ACETAMINOPHEN 325 MG PO TABS
650.0000 mg | ORAL_TABLET | Freq: Four times a day (QID) | ORAL | Status: DC | PRN
  Administered 2020-06-24: 650 mg via ORAL
  Filled 2020-06-23: qty 2

## 2020-06-23 MED ORDER — GABAPENTIN 100 MG PO CAPS
100.0000 mg | ORAL_CAPSULE | Freq: Two times a day (BID) | ORAL | Status: DC
  Administered 2020-06-23 – 2020-06-24 (×4): 100 mg via ORAL
  Filled 2020-06-23 (×4): qty 1

## 2020-06-23 MED ORDER — OSMOLITE 1.5 CAL PO LIQD
1200.0000 mL | ORAL | Status: DC
  Administered 2020-06-23: 1200 mL

## 2020-06-23 MED ORDER — MAGNESIUM SULFATE 2 GM/50ML IV SOLN
2.0000 g | Freq: Once | INTRAVENOUS | Status: AC
  Administered 2020-06-24: 2 g via INTRAVENOUS
  Filled 2020-06-23: qty 50

## 2020-06-23 MED ORDER — FREE WATER
300.0000 mL | Status: DC
  Administered 2020-06-23 (×3): 300 mL

## 2020-06-23 MED ORDER — DEXTROSE-NACL 5-0.9 % IV SOLN: INTRAVENOUS | Status: DC

## 2020-06-23 MED ORDER — FINASTERIDE 5 MG PO TABS
5.0000 mg | ORAL_TABLET | Freq: Every day | ORAL | Status: DC
  Administered 2020-06-23 – 2020-06-25 (×3): 5 mg via ORAL
  Filled 2020-06-23 (×3): qty 1

## 2020-06-23 MED ORDER — POTASSIUM CHLORIDE 10 MEQ/100ML IV SOLN
10.0000 meq | Freq: Once | INTRAVENOUS | Status: AC
  Administered 2020-06-23: 10 meq via INTRAVENOUS
  Filled 2020-06-23: qty 100

## 2020-06-23 MED ORDER — MORPHINE SULFATE (PF) 2 MG/ML IV SOLN
0.5000 mg | INTRAVENOUS | Status: DC | PRN
  Administered 2020-06-23 – 2020-06-26 (×13): 0.5 mg via INTRAVENOUS
  Filled 2020-06-23 (×13): qty 1

## 2020-06-23 MED ORDER — ONDANSETRON HCL 4 MG/2ML IJ SOLN: 4.0000 mg | Freq: Three times a day (TID) | INTRAMUSCULAR | Status: DC | PRN

## 2020-06-23 MED ORDER — TRAZODONE HCL 50 MG PO TABS
50.0000 mg | ORAL_TABLET | Freq: Every day | ORAL | Status: DC
  Administered 2020-06-23 – 2020-06-25 (×3): 50 mg
  Filled 2020-06-23 (×3): qty 1

## 2020-06-23 MED ORDER — SODIUM CHLORIDE 0.9 % IV BOLUS
500.0000 mL | Freq: Once | INTRAVENOUS | Status: AC
  Administered 2020-06-23: 500 mL via INTRAVENOUS

## 2020-06-23 MED ORDER — MIDODRINE HCL 5 MG PO TABS
10.0000 mg | ORAL_TABLET | Freq: Three times a day (TID) | ORAL | Status: DC
  Administered 2020-06-23 – 2020-06-26 (×9): 10 mg
  Filled 2020-06-23 (×11): qty 2

## 2020-06-23 MED ORDER — POLYETHYLENE GLYCOL 3350 17 G PO PACK: 17.0000 g | PACK | Freq: Every day | ORAL | Status: DC | PRN

## 2020-06-23 MED ORDER — THIAMINE HCL 100 MG PO TABS
100.0000 mg | ORAL_TABLET | Freq: Every day | ORAL | Status: DC
  Administered 2020-06-23 – 2020-06-26 (×3): 100 mg
  Filled 2020-06-23 (×3): qty 1

## 2020-06-23 MED ORDER — SODIUM CHLORIDE 0.9 % IV SOLN: Freq: Once | INTRAVENOUS | Status: AC

## 2020-06-23 MED ORDER — HYDROCODONE-ACETAMINOPHEN 7.5-325 MG PO TABS
1.0000 | ORAL_TABLET | Freq: Four times a day (QID) | ORAL | Status: DC | PRN
  Administered 2020-06-23 – 2020-06-25 (×8): 1
  Filled 2020-06-23 (×8): qty 1

## 2020-06-23 MED ORDER — VALPROIC ACID 250 MG/5ML PO SOLN
125.0000 mg | Freq: Every day | ORAL | Status: DC
  Administered 2020-06-23 – 2020-06-25 (×3): 125 mg
  Filled 2020-06-23 (×4): qty 5

## 2020-06-23 MED ORDER — ACETAMINOPHEN 650 MG RE SUPP: 650.0000 mg | Freq: Four times a day (QID) | RECTAL | Status: DC | PRN

## 2020-06-23 MED ORDER — SODIUM CHLORIDE 0.9 % IV SOLN
100.0000 mg | Freq: Two times a day (BID) | INTRAVENOUS | Status: DC
  Administered 2020-06-23 – 2020-06-26 (×8): 100 mg via INTRAVENOUS
  Filled 2020-06-23 (×8): qty 100

## 2020-06-23 NOTE — ED Notes (Signed)
Attempt to call report to floor, no answer on floor.

## 2020-06-23 NOTE — ED Notes (Signed)
Pt sleeping. 

## 2020-06-23 NOTE — ED Notes (Signed)
Iv team here 

## 2020-06-23 NOTE — ED Notes (Signed)
phelbotomist that came for v enipuncture was not able to obtain samples. Will send another staff member from lab for assistance. Pt updated on delay. Head adjusted in pt's room for comfort.

## 2020-06-23 NOTE — ED Notes (Signed)
Pt states "why haven't I been admitted yet". Explanation again given to pt regarding admission, pt again told need labs at th is time. Pt ifnormed that will allow ivf to infuse then call lab again for venipuncture when ivf complete.

## 2020-06-23 NOTE — ED Notes (Signed)
Lab states unable to get blood work.

## 2020-06-23 NOTE — ED Notes (Signed)
Lab notified of need for repeat venipuncture assist.

## 2020-06-23 NOTE — ED Notes (Signed)
Lab here for venipuncture.  

## 2020-06-23 NOTE — ED Notes (Signed)
Pt to ct scan.

## 2020-06-23 NOTE — ED Notes (Addendum)
Lea, rn in to attempt venipuncture, like all staff before small flash with small amount of blood return noted before blood no longer flows. Dr. Karma Greaser previously spoke with noel, rn to attempt venipuncture under ultrasound.

## 2020-06-23 NOTE — ED Notes (Signed)
Waiting on restock og mg2+ from pharmacy.

## 2020-06-23 NOTE — ED Notes (Signed)
Report to ashley, rn
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
## 2020-06-23 NOTE — ED Notes (Signed)
tv remote provided, lights dimmed for comfort.

## 2020-06-23 NOTE — ED Notes (Signed)
Spoke with noel, rn regarding ultrasound guided access for venipuncture and lab work. Confirmed noel, rn will attempt.

## 2020-06-23 NOTE — ED Notes (Signed)
Iv team not able to get blood work. phelbotomist in lab notified of need for assistance with venipuncture.

## 2020-06-24 ENCOUNTER — Inpatient Hospital Stay

## 2020-06-24 DIAGNOSIS — E43 Unspecified severe protein-calorie malnutrition: Secondary | ICD-10-CM

## 2020-06-24 DIAGNOSIS — R627 Adult failure to thrive: Secondary | ICD-10-CM

## 2020-06-24 DIAGNOSIS — L8990 Pressure ulcer of unspecified site, unspecified stage: Secondary | ICD-10-CM | POA: Insufficient documentation

## 2020-06-24 LAB — BASIC METABOLIC PANEL
Anion gap: 8 (ref 5–15)
BUN: 25 mg/dL — ABNORMAL HIGH (ref 6–20)
CO2: 35 mmol/L — ABNORMAL HIGH (ref 22–32)
Calcium: 9.3 mg/dL (ref 8.9–10.3)
Chloride: 98 mmol/L (ref 98–111)
Creatinine, Ser: 0.54 mg/dL — ABNORMAL LOW (ref 0.61–1.24)
GFR calc Af Amer: >60 mL/min (ref >60)
GFR calc non Af Amer: >60 mL/min (ref >60)
Glucose, Bld: 64 mg/dL — ABNORMAL LOW (ref 70–99)
Potassium: 3.7 mmol/L (ref 3.5–5.1)
Sodium: 141 mmol/L (ref 135–145)

## 2020-06-24 LAB — GLUCOSE, CAPILLARY
Glucose-Capillary: 81 mg/dL (ref 70–99)
Glucose-Capillary: 82 mg/dL (ref 70–99)

## 2020-06-24 LAB — CBC
HCT: 30.4 % — ABNORMAL LOW (ref 39.0–52.0)
Hemoglobin: 9.5 g/dL — ABNORMAL LOW (ref 13.0–17.0)
MCH: 30.3 pg (ref 26.0–34.0)
MCHC: 31.3 g/dL (ref 30.0–36.0)
MCV: 96.8 fL (ref 80.0–100.0)
Platelets: 191 10*3/uL (ref 150–400)
RBC: 3.14 MIL/uL — ABNORMAL LOW (ref 4.22–5.81)
RDW: 15.5 % (ref 11.5–15.5)
WBC: 6.5 10*3/uL (ref 4.0–10.5)
nRBC: 0 % (ref 0.0–0.2)

## 2020-06-24 MED ORDER — FREE WATER
300.0000 mL | Freq: Every day | Status: DC
  Administered 2020-06-24 – 2020-06-25 (×4): 300 mL

## 2020-06-24 MED ORDER — CHLORHEXIDINE GLUCONATE CLOTH 2 % EX PADS
6.0000 | MEDICATED_PAD | Freq: Every day | CUTANEOUS | Status: DC
  Administered 2020-06-24: 6 via TOPICAL

## 2020-06-24 MED ORDER — OSMOLITE 1.5 CAL PO LIQD
237.0000 mL | Freq: Every day | ORAL | Status: DC
  Administered 2020-06-24 – 2020-06-25 (×4): 237 mL

## 2020-06-24 MED ORDER — DEXTROSE 50 % IV SOLN: 50.0000 mL | INTRAVENOUS | Status: DC | PRN

## 2020-06-24 MED ORDER — IOHEXOL 300 MG/ML  SOLN
7.0000 mL | Freq: Once | INTRAMUSCULAR | Status: AC | PRN
  Administered 2020-06-24: 7 mL
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
## 2020-06-24 NOTE — Consult Note (Addendum)
[facility] Nurse Consult Note: Reason for Consult:Consult requested forsplit fistula; pt is familiar to [facility] team from several previous admissions. Pt states he is able to apply a pouch to the affected area prior to admission, but requires assistance while in the hospital since he cannot visualize the location.Right upper chest with a full thickness opening and fistula with large amt thick mucous drainage; .5X.3. Current pouch is leaking and skin is red and macerated. Applied new pouch and connected to bedside drainage bag. 4 extra sets of supplies left in the room.   Dressing procedure/placement/frequency:Instructions provided for bedside nurses to change the pouch PRN if leaking as follows:Bedside nurse; please change the neck pouch PRN if leaking as follows:  1. Remove pouch, cleanse skin with warm water and pat dry.  2. If skin is very red and macerated, apply a layer of ostomy powder (left at bedside), then brush off the excess.  3. Apply skin prep wipes over all dry exposed skin. If ostomy powder was applied, then dab over the powder areas to add a protective layer. 4. Use pattern (left in room) to cut an opening in the wafer, then apply a barrier ring stretched to fit around the opening. ( Barrier rings, Kellie Simmering # [phone], ostomy wafer Kellie Simmering # [phone], Urostomy pouch [place] # [phone]. Apply the barrier ring/pouch over the opening and press around edges to seal.  6. Cut a piece of pink tape or Tegaderm in half and apply 4 pieces around the pouch edges to help hold in place, like a picture frame, or use pink tape. 7. Make sure the spout is turned on (a drop should be showing) and attach to the bedside drainage bag.  Requested to assess PEG site; which is leaking mod amt green drainage.  Site is red and macerated; appearance is consistent with moisturte associated skin damage and probable candidiasis. Topical treatment will NOT be effective to control drainage; please refer to radiology for further  recommendations.    Instructions provided for bedside nurses to change the dressing PRN if leaking to protect skin as follows: Change dressing to PEG site PRN when soiled as follows:  Cleanse with water, then apply barrier cream and antifungal powder.  Apply split thickness gauze and abd pads and plastic tape at top and bottom, DO NOT USE PAPER TAPE per patient request.  Supplies provided at bedside for patient and staff nurse use.   Please re-consult if further assistance is needed.  Thank-you,  Julien Girt MSN, [place], [place], [place], [place]
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
## 2020-06-24 NOTE — Hospital Course (Addendum)
From H&P by Dr. Blaine Hamper 7/18: "Thomas [name] is a 49 y.o. male with medical history significant of esophageal atresia, s/p of sternal esophagostomy tube placement for oral secretions and G-tube placement for feeding, GERD, nephrolithiasis, BPH, indwelling Foley catheter, who presents with PEG tube malformation.   Pt states that his esophagostomy tube and bag simply fell out last night. He also reports that the G-tube has been draining green material for several days. He has some central abdominal pain around the G-tube, but no nausea vomiting or diarrhea.  Patient does not have chest pain, shortness breath, cough.  No fever or chills.  He has indwelling Foley catheter, but has no complaints about its functionality at this time.  The Foley is due to recurrent urinary retention and he also has a history of Klebsiella UTIs.   ED Course: pt was found to have WBC 13.3, INR 1.1, negative COVID-19 PCR, urinalysis not impressive (cloudy appearance, negative leukocytes, no bacteria, WBC 6/10), renal function okay, potassium 3.0, blood pressure 97/51, heart rate 60, oxygen saturation 97% on room air, temperature normal, RR 18.  Patient is admitted to [facility] bed as inpatient.  IR is consulted."   CT-abdomen/pelvis: 1. Very limited exam due to severe cachexia. No definite drainable fluid collection or abscess identified in the anterior abdominal wall or adjacent to the gastrostomy. 2. Small pockets of air adjacent to the anastomotic suture in the anterior mid abdomen favored to represent intraluminal air within the adjacent anastomosed bowel. However, extraluminal air suggestive of sutural dehiscence is not excluded. 3. A 1 cm stone in the region of the right renal hilum new since the prior CT and may represent a stone in the right renal pelvis. No significant hydronephrosis. 4. Constipation.  No bowel obstruction."

## 2020-06-24 NOTE — Progress Notes (Addendum)
[facility] hospital liaison note:  Patient is currently followed by [facility] hospice services at [facility] with a hospice diagnosis abnormal weight loss. He is a DNR code, MOST form in place. He came to the [facility] ED for evaluation of G-tube displacement. Hospice was aware of this transfer. This is a related admission per hospice physician Dr. Jewel Baize.  Chart notes reviewed. Patient has received PRN hydrocodone 7.5/325 mg x 3 doses today. Visit made to patient's room, he was at a procedure and his mother was present in the room. Emotional support provided.  Plan is for return to [facility] with continued hospice services when medically stable.  VS: 90/56, 64, 16 oxygen saturation 97% I/O 523.34/300 Abnormal labs: Bun 25, Creatinine 0.54, Glucose 64, Hb 9.5 IVs/PRNs  NS at 75 ml/hr doxycycline 100 mg PIV Q 12 hrs, Hydrocodone 7.5/325 mg q 6 PRN  Principal Problem:   PEG tube malfunction (HCC) Active Problems:   Protein-calorie malnutrition, severe   Hypokalemia   Leukocytosis   Pressure injury of skin   Esophagostomy tube malfunction: PEG tube malfunction (HCC):esophagostomy tube and bag fell outnight before admission. Has been having greenish drainage from and around his G-tube recently and presented with some abdominal pain.  CT findings as above. Plan for IR to replace G tube if needed.  However, patient has not gained any weight despite 2000 cal/day and compliance with tube feeds since he's been at SNF.  Unclear why he's not gaining weight.  May need to have J tube placed.  Discussed with on call surgeon who reviewed above history and complex anatomy, recommended transfer to [facility].  [facility] declined transfer as patient's more recent surgery's have been at [facility].  He is on wait list to transfer to [facility] pending bed availability.   --Tube feeds resumed today, cleared by surgery --Surgery consulted, appreciated --Dietician following,  appreciated --Continue maintenance fluids until tube feeds resumed and tolerated --IR was consulted on admission for G tube replacement, but appears not needed at this time.  Protein-calorie malnutrition, severe - dietician following for tube feeds.  As above, may need J tube.  No weight gain on high calorie diet now for awhile.  Followed by hospice at [facility].  Hypokalemia - K=3.0on admission. Repleted.  Mg level normal.  K was replaced. Daily BMP and Mg.  Leukocytosis - POA with WBC 13.3. pt hasgreenish material drainage around the from G-tube, cannot rule out infection. CT-abd/pelvis is limited, but did not showdefinite drainable fluid collection or abscess identified in the anterior abdominal wall or adjacent to the gastrostomy. -will startdoxycycline empirically -Follow up blood culture  H+P Assessment/Plan Principal Problem:   PEG tube malfunction (HCC) Active Problems:   Protein-calorie malnutrition, severe   Hypokalemia   Leukocytosis   PEG tube malfunction [facility]):  esophagostomy tube and bag fell out last night. Pt also has greenish material drainage from and around his G-tube.  Dr. Earleen Newport of IR is consulted -->likely do procedure on Monday.  -will admitted to [place] bed as inpatient -pending IR evaluation treatment -IV fluid: 500 cc normal saline, followed by 75 cc/h  Protein-calorie malnutrition, severe -will continue nutrition supplement -nutrition consult  Hypokalemia: K=3.0  on admission. - Repleted - Check Mg level -->2.3 - 2 g of magnesium sulfate was given in ED  Leukocytosis: WBC 13.3. pt has greenish material drainage around the from G-tube, cannot rule out infection. CT-abd/pelvis is limited, but did not show definite drainable fluid collection or abscess identified  in the anterior abdominal wall or adjacent to the gastrostomy. -will start doxycycline empirically -Follow up blood culture  Epic imaging: CLINICAL DATA:  49 year old male with Peri  land material draining from around the PEG tube. Concern for abscess. EXAM: CT ABDOMEN AND PELVIS WITHOUT CONTRAST IMPRESSION: 1. Very limited exam due to severe cachexia. No definite drainable fluid collection or abscess identified in the anterior abdominal wall or adjacent to the gastrostomy. 2. Small pockets of air adjacent to the anastomotic suture in the anterior mid abdomen favored to represent intraluminal air within the adjacent anastomosed bowel. However, extraluminal air suggestive of sutural dehiscence is not excluded. 3. A 1 cm stone in the region of the right renal hilum new since the prior CT and may represent a stone in the right renal pelvis. No significant hydronephrosis. 4. Constipation.  No bowel obstruction.    Discharge planning: Patient to discharge back to [facility] with continued hospice services.  Will continue to follow thorough discharge and update hospice team. Please call with any hospice related questions or concerns.  Flo Shanks BSN, RN, [place] [phone]
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
## 2020-06-25 LAB — BASIC METABOLIC PANEL
Anion gap: 6 (ref 5–15)
BUN: 26 mg/dL — ABNORMAL HIGH (ref 6–20)
CO2: 34 mmol/L — ABNORMAL HIGH (ref 22–32)
Calcium: 8.7 mg/dL — ABNORMAL LOW (ref 8.9–10.3)
Chloride: 103 mmol/L (ref 98–111)
Creatinine, Ser: 0.59 mg/dL — ABNORMAL LOW (ref 0.61–1.24)
GFR calc Af Amer: >60 mL/min (ref >60)
GFR calc non Af Amer: >60 mL/min (ref >60)
Glucose, Bld: 149 mg/dL — ABNORMAL HIGH (ref 70–99)
Potassium: 4 mmol/L (ref 3.5–5.1)
Sodium: 143 mmol/L (ref 135–145)

## 2020-06-25 LAB — CBC
HCT: 30.1 % — ABNORMAL LOW (ref 39.0–52.0)
Hemoglobin: 9.9 g/dL — ABNORMAL LOW (ref 13.0–17.0)
MCH: 30.9 pg (ref 26.0–34.0)
MCHC: 32.9 g/dL (ref 30.0–36.0)
MCV: 94.1 fL (ref 80.0–100.0)
Platelets: 209 10*3/uL (ref 150–400)
RBC: 3.2 MIL/uL — ABNORMAL LOW (ref 4.22–5.81)
RDW: 15.3 % (ref 11.5–15.5)
WBC: 5.6 10*3/uL (ref 4.0–10.5)
nRBC: 0 % (ref 0.0–0.2)

## 2020-06-25 LAB — GLUCOSE, CAPILLARY
Glucose-Capillary: 123 mg/dL — ABNORMAL HIGH (ref 70–99)
Glucose-Capillary: 130 mg/dL — ABNORMAL HIGH (ref 70–99)

## 2020-06-25 LAB — MAGNESIUM: Magnesium: 2.3 mg/dL (ref 1.7–2.4)

## 2020-06-25 IMAGING — DX DG CHEST 1V
1 series · 1 of 1 positions shown · non-contrast
Comparison: 03/12/2020

CLINICAL DATA: Dyspnea, cough

EXAM:
CHEST  1 VIEW

[chest ap]
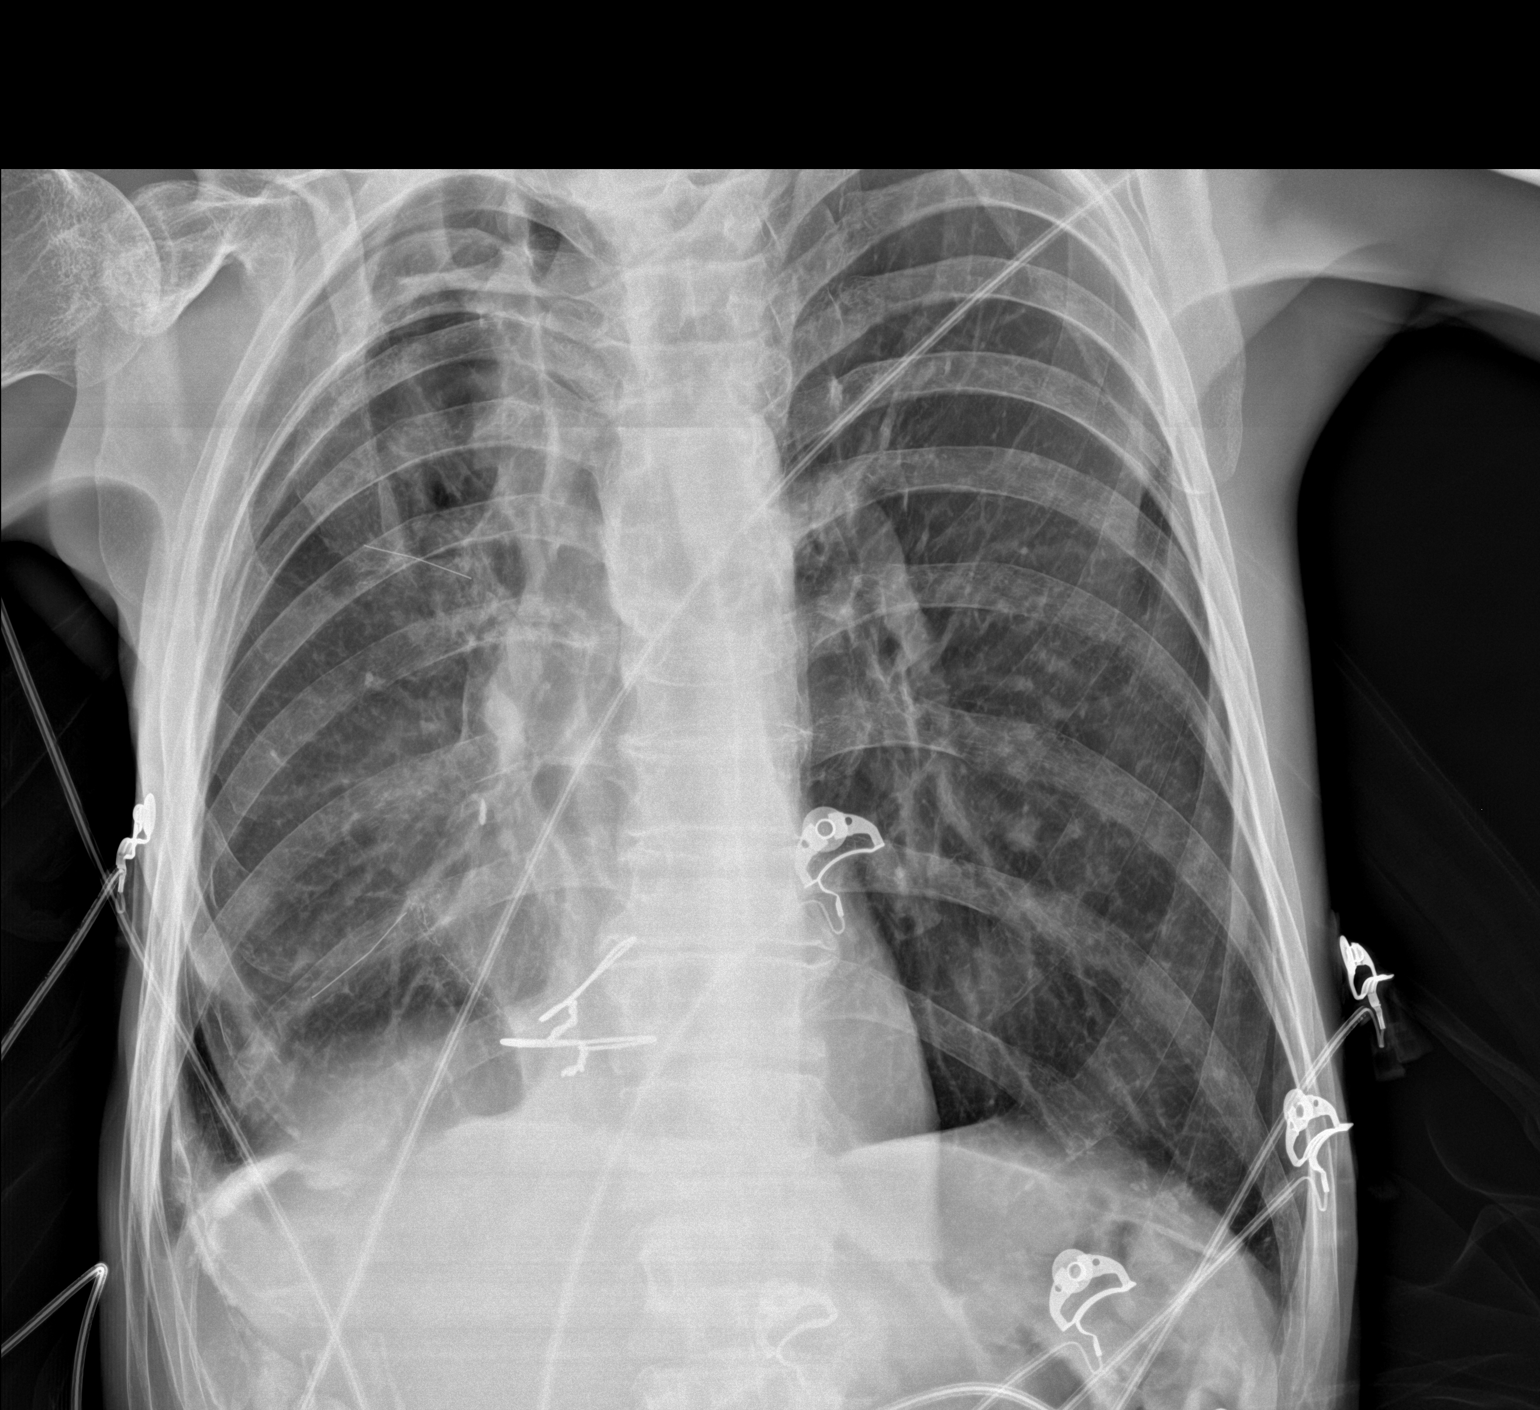

[1 of 1 positions shown; findings below may reference images not displayed]

FINDINGS: Single frontal view of the chest was obtained. The patient is
rotated to the right, which limits the evaluation. Linear metallic
densities are again seen within the right lung. Chronic scarring is
seen at the right lung base. Severe background emphysema without
airspace disease, effusion, or pneumothorax. No acute bony
abnormalities.
IMPRESSION: 1. Stable emphysema and scarring.  No acute airspace disease.

## 2020-06-25 MED ORDER — GABAPENTIN 300 MG/6ML PO SOLN
100.0000 mg | Freq: Two times a day (BID) | ORAL | Status: DC
  Administered 2020-06-25 – 2020-06-26 (×2): 100 mg
  Filled 2020-06-25 (×5): qty 2

## 2020-06-25 MED ORDER — FREE WATER
300.0000 mL | Freq: Four times a day (QID) | Status: DC
  Administered 2020-06-25 – 2020-06-26 (×4): 300 mL

## 2020-06-25 MED ORDER — OSMOLITE 1.5 CAL PO LIQD
1000.0000 mL | ORAL | Status: DC
  Administered 2020-06-25: 1000 mL

## 2020-06-25 MED ORDER — TAMSULOSIN HCL 0.4 MG PO CAPS
0.4000 mg | ORAL_CAPSULE | Freq: Every day | ORAL | Status: DC
  Administered 2020-06-25: 0.4 mg via ORAL
  Filled 2020-06-25: qty 1

## 2020-06-25 MED ORDER — OSMOLITE 1.5 CAL PO LIQD: 1000.0000 mL | ORAL | Status: DC

## 2020-06-25 NOTE — TOC Initial Note (Addendum)
Transition of Care [facility]) - Initial/Assessment Note    Patient Details  Name: Thomas [name] MRN: [id number] Date of Birth: Feb 20, 1971  Transition of Care [facility]) CM/SW Contact:    Candie Chroman, LCSW Phone Number: 06/25/2020, 9:10 AM  Clinical Narrative:  Readmission prevention screen complete. CSW met with patient. No supports at bedside. CSW introduced role and explained that discharge planning would be discussed. Patient confirmed he is a long-term resident at [facility]. He receives hospice services through [facility] there as well. Patient asking about receiving extra fluids, wants to go to the snack machine to get some gum, etc. RN is aware. No further concerns. CSW encouraged patient to contact CSW as needed. CSW will continue to follow patient for support and facilitate return to SNF when stable.                11:17 am: Per MD, planning on discharge back to [facility] tomorrow with continuous tube feeds. SNF admissions coordinator is aware and agreeable.  Expected Discharge Plan: Skilled Nursing Facility Barriers to Discharge: Continued Medical Work up   Patient Goals and CMS Choice     Choice offered to / list presented to : NA  Expected Discharge Plan and Services Expected Discharge Plan: [place] Choice: [place] Living arrangements for the past 2 months: [place], [place]                                      Prior Living Arrangements/Services Living arrangements for the past 2 months: [place], [place] Lives with:: Facility Resident Patient language and need for interpreter reviewed:: Yes Do you feel safe going back to the place where you live?: Yes      Need for Family Participation in Patient Care: Yes (Comment) Care giver support system in place?: Yes (comment)   Criminal Activity/Legal Involvement Pertinent to Current  Situation/Hospitalization: No - Comment as needed  Activities of Daily Living Home Assistive Devices/Equipment: Wheelchair ADL Screening (condition at time of admission) Patient's cognitive ability adequate to safely complete daily activities?: Yes Is the patient deaf or have difficulty hearing?: No Does the patient have difficulty seeing, even when wearing glasses/contacts?: No Does the patient have difficulty concentrating, remembering, or making decisions?: No Patient able to express need for assistance with ADLs?: Yes Does the patient have difficulty dressing or bathing?: Yes Independently performs ADLs?: No Communication: Independent Dressing (OT): Needs assistance Is this a change from baseline?: Pre-admission baseline Grooming: Needs assistance Is this a change from baseline?: Pre-admission baseline Feeding: Needs assistance Is this a change from baseline?: Pre-admission baseline Bathing: Needs assistance Is this a change from baseline?: Pre-admission baseline Toileting: Needs assistance Is this a change from baseline?: Pre-admission baseline In/Out Bed: Needs assistance Is this a change from baseline?: Pre-admission baseline Walks in Home: Needs assistance Is this a change from baseline?: Pre-admission baseline Does the patient have difficulty walking or climbing stairs?: Yes Weakness of Legs: Left Weakness of Arms/Hands: Both  Permission Sought/Granted Permission sought to share information with : Facility Art therapist granted to share information with : Yes, Verbal Permission Granted     Permission granted to share info w AGENCY: [place] [facility]        Emotional Assessment Appearance:: Appears stated age Attitude/Demeanor/Rapport: Engaged Affect (typically observed): Calm  Orientation: : Oriented to Self, Oriented to Place, Oriented to  Time, Oriented to Situation Alcohol / Substance Use: Not Applicable Psych Involvement: No  (comment)  Admission diagnosis:  Hypokalemia [E87.6] 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 Pressure injury of skin 06/24/2020  . PEG tube malfunction ([place]) 06/23/2020  . Leukocytosis 06/23/2020  . Severe malnutrition ([place])   . Hypokalemia 05/20/2020  . Acute bronchitis   . Generalized weakness   . AKI (acute kidney injury) ([place]) 04/21/2020  . History of esophagotomy 04/21/2020  . Sepsis ([place]) 04/21/2020  . Hypotension 04/21/2020  . Flank pain, acute on chronic 04/21/2020  . Nephrolithiasis 04/21/2020  . Acute UTI 04/05/2020  . Acute urinary retention 04/04/2020  . Leaking percutaneous endoscopic gastrostomy (PEG) tube ([place]) 04/04/2020  . Dehydration   . Dehydration with hypernatremia 03/12/2020  . Chronic pain syndrome 03/12/2020  . Fall 02/17/2020  . 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 Fluid collection at surgical site 02/17/2020  . 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 Incidental pulmonary nodule 02/17/2020  . Ketosis ([place]) 02/17/2020  . 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 Low grade fever 01/13/2020  . Gastrostomy tube in place ([place]) 01/13/2020  . Dizziness 12/31/2019  . Gastrojejunostomy tube status ([place]) 12/31/2019  . Protein-calorie malnutrition, severe 12/20/2019  . Hypernatremia 12/18/2019  . Acute esophageal ulcer with perforation 06/11/2019  . Right-sided chest pain 01/31/2019  . Anastomotic ulcer 01/31/2019   PCP:  [name], NP Pharmacy:   [facility], [place] [address] [place] [address] Phone: [phone] Fax: [phone]  [id number] [place], [place]Phone: [phone]:  [phone]    Social Determinants of Health (SDOH) Interventions    Readmission Risk Interventions Readmission Risk Prevention Plan 06/25/2020 05/23/2020 05/22/2020  Transportation Screening Complete - Complete  PCP or Specialist Appt within 3-5 Days - - -  [place]RI or [place]- - -  Medication Review (RN Care Manager) Complete - Complete  PCP or Specialist appointment within 3-5 days of discharge Complete (No Data) -  [place] Home Care Consult - - Complete  SW Recovery Care/Counseling Consult Complete - -  Palliative Care Screening Complete - -  Skilled Nursing Facility Complete - Complete  Some recent data might be hidden

## 2020-06-25 NOTE — NC FL2 (Signed)
[place] LEVEL OF CARE SCREENING TOOL     IDENTIFICATION  Patient Name: Thomas [name] Birthdate: 1971-12-06 Sex: male Admission Date (Current Location): 06/22/2020  [place] and [place] Number:  Engineering geologist and Address:  [facility], [address] [place], [place] [address]      Provider Number: [id number]  Attending Physician Name and Address:  Ezekiel Slocumb, DO  Relative Name and Phone Number:       Current Level of Care: Hospital Recommended Level of Care: [place] (with hospice through [facility]) Prior Approval Number:    Date Approved/Denied:   PASRR Number: [id number]  Discharge Plan: SNF (with hospice through [facility])    Current Diagnoses: Patient Active Problem List   Diagnosis Date Noted  . Pressure injury of skin 06/24/2020  . PEG tube malfunction ([place]) 06/23/2020  . Leukocytosis 06/23/2020  . Severe malnutrition ([place])   . Hypokalemia 05/20/2020  . Acute bronchitis   . Generalized weakness   . AKI (acute kidney injury) ([place]) 04/21/2020  . History of esophagotomy 04/21/2020  . Sepsis ([place]) 04/21/2020  . Hypotension 04/21/2020  . Flank pain, acute on chronic 04/21/2020  . Nephrolithiasis 04/21/2020  . Acute UTI 04/05/2020  . Acute urinary retention 04/04/2020  . Leaking percutaneous endoscopic gastrostomy (PEG) tube ([place]) 04/04/2020  . Dehydration   . Dehydration with hypernatremia 03/12/2020  . Chronic pain syndrome 03/12/2020  . Fall 02/17/2020  . 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 Fluid collection at surgical site 02/17/2020  . 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 Incidental pulmonary nodule 02/17/2020  . Ketosis ([place]) 02/17/2020  . 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 Low grade fever 01/13/2020  . Gastrostomy tube in place ([place]) 01/13/2020  . Dizziness 12/31/2019  . Gastrojejunostomy tube status ([place]) 12/31/2019  . Protein-calorie malnutrition, severe  12/20/2019  . Hypernatremia 12/18/2019  . Acute esophageal ulcer with perforation 06/11/2019  . Right-sided chest pain 01/31/2019  . Anastomotic ulcer 01/31/2019    Orientation RESPIRATION BLADDER Height & Weight     Self, Time, Situation, Place  Normal Continent, Indwelling catheter Weight: 80 lb (36.3 kg) Height:  5\' 5"  (165.1 cm)  BEHAVIORAL SYMPTOMS/MOOD NEUROLOGICAL BOWEL NUTRITION STATUS   (None)  (None) Continent Feeding tube, Diet (PEG. Clear liquids.)  AMBULATORY STATUS COMMUNICATION OF NEEDS Skin     Verbally PU Stage and Appropriate Care, Other (Comment) (MASD.) PU Stage 1 Dressing:  (Mid sacrum: Foam prn. Mid-upper back: Foam prn.)                     Personal Care Assistance Level of Assistance              Functional Limitations Info  Sight, Hearing, Speech Sight Info: Adequate Hearing Info: Adequate Speech Info: Adequate    SPECIAL CARE FACTORS FREQUENCY                       Contractures Contractures Info: Not present    Additional Factors Info  Code Status, Allergies Code Status Info: DNR Allergies Info: Aspirin, Penicillins, Vinegar (Acetic Acid).           Current Medications (06/25/2020):  This is the current hospital active medication list Current Facility-Administered Medications  Medication Dose Route Frequency Provider Last Rate Last Admin  . 0.9 %  sodium chloride infusion   Intravenous Continuous Ivor Costa, MD 75 mL/hr at 06/25/20 0112 New Bag at 06/25/20 0112  . acetaminophen (  TYLENOL) tablet 650 mg  650 mg Oral Q6H PRN Ivor Costa, MD   650 mg at 06/24/20 1307   Or  . acetaminophen (TYLENOL) suppository 650 mg  650 mg Rectal Q6H PRN Ivor Costa, MD      . Chlorhexidine Gluconate Cloth 2 % PADS 6 each  6 each Topical Daily Ivor Costa, MD   6 each at 06/24/20 1048  . dextrose 50 % solution 50 mL  50 mL Intravenous PRN Ivor Costa, MD      . doxycycline (VIBRAMYCIN) 100 mg in sodium chloride 0.9 % 250 mL IVPB  100 mg Intravenous  Q12H Ivor Costa, MD 125 mL/hr at 06/24/20 2140 100 mg at 06/24/20 2140  . feeding supplement (OSMOLITE 1.5 CAL) liquid 237 mL  237 mL Per Tube 5 X Daily Nicole Kindred A, DO 50 mL/hr at 06/24/20 1800 237 mL at 06/25/20 0611  . finasteride (PROSCAR) tablet 5 mg  5 mg Oral QHS Ivor Costa, MD   5 mg at 06/24/20 2135  . free water 300 mL  300 mL Per Tube 5 X Daily Nicole Kindred A, DO   300 mL at 06/25/20 6761  . gabapentin (NEURONTIN) capsule 100 mg  100 mg Oral BID Ivor Costa, MD   100 mg at 06/24/20 2136  . HYDROcodone-acetaminophen (NORCO) 7.5-325 MG per tablet 1 tablet  1 tablet Per Tube Q6H PRN Ivor Costa, MD   1 tablet at 06/25/20 0054  . midodrine (PROAMATINE) tablet 10 mg  10 mg Per Tube TID WC Ivor Costa, MD   10 mg at 06/24/20 1723  . morphine 2 MG/ML injection 0.5 mg  0.5 mg Intravenous Q4H PRN Ivor Costa, MD   0.5 mg at 06/25/20 0608  . ondansetron (ZOFRAN) injection 4 mg  4 mg Intravenous Q8H PRN Ivor Costa, MD      . polyethylene glycol (MIRALAX / GLYCOLAX) packet 17 g  17 g Per Tube Daily PRN Ivor Costa, MD      . polyvinyl alcohol (LIQUIFILM TEARS) 1.4 % ophthalmic solution 1 drop  1 drop Both Eyes BID PRN Barefoot, Jody C, RPH      . thiamine tablet 100 mg  100 mg Per Tube Daily Ivor Costa, MD   100 mg at 06/23/20 0958  . traZODone (DESYREL) tablet 50 mg  50 mg Per Tube QHS Ivor Costa, MD   50 mg at 06/24/20 2135  . valproic acid (DEPAKENE) 250 MG/5ML solution 125 mg  125 mg Per Tube QHS Ivor Costa, MD   125 mg at 06/24/20 2149     Discharge Medications: Please see discharge summary for a list of discharge medications.  Relevant Imaging Results:  Relevant Lab Results:   Additional Information SS#: [id number]  Candie Chroman, LCSW

## 2020-06-25 NOTE — Progress Notes (Signed)
[facility] hospital Liaison note:  Patient is currently followed by [facility] hospice services at [facility] with a hospice diagnosis abnormal weight loss. He is a DNR code, MOST form in place. He came to the [facility] ED for evaluation of G-tube displacement. Hospice was aware of this transfer. This is a related admission per hospice physician Dr. Jewel Baize.  Chart notes reviewed and visit made at bedside with attending physician Dr. Arbutus Ped. A discussion was had regarding transfer to [facility] for further surgery. Patient clearly stated he did not want a transfer. He did list several complaints regarding his current care at his facility. He would like to discharge with continuous feeds instead of bolus feeds and would also like discharge orders for additional Boost or Ensure to be given. He stated he "liked the taste of them". Tube feeds are currently running at 95 cc/hr for 14 hrs and current plan per discussion with Dr. Arbutus Ped is for patient to discharge back to [facility] tomorrow if he is tolerating the continuous feeds.  VS: 98.1, 99/63, 64, 100% on RA I/O  Abnormal labs:Glucose 149, BUN 26, Creat 0.59, CA 8.7, HB 9.9, HCY 30.1  IV/ PRN medications: NS at 75 ml/hr continuous, doxycycline 100 mg IVPB Q 12 hrs Hydrocodone/acetaminophen 7.5-325 mg 1 tab Q 6 PRN-patient has received 2 doses since midnight.  Principal Problem:   PEG tube malfunction (HCC) Active Problems:   Protein-calorie malnutrition, severe   Hypokalemia   Leukocytosis   Pressure injury of skin   Esophagostomy tube malfunction: PEG tube malfunction (HCC):esophagostomy tube and bag fell outnight before admission, has been replaced. Has been having greenish drainage from and around his G-tube recently and presented with some abdominal pain.  CT findings as above.  Of note, patient has not gained any weight despite 2000 cal/day and compliance with tube feeds since  he's been at SNF.  May need to have J tube placed.  Discussed with on call surgeon who reviewed above history and complex anatomy, recommended transfer.  Transfer to [facility] was arranged but cancelled today after discussion outlined above. --Tube is functional and cleared for use by surgery --Continuous tube feeds  --Surgery consulted, appreciated --Dietician following, appreciated --Continue maintenance fluids until tube feeds resumed and tolerated --Likely return to [facility] tomorrow if tolerating tube feeds  Protein-calorie malnutrition, severe - dietician following for tube feeds.  As above, may need J tube.  No weight gain on high calorie diet now for awhile.  Followed by hospice at [facility].  Hypokalemia - K=3.0on admission. Repleted.  Mg level normal.  K was replaced. Daily BMP and Mg.  Leukocytosis - POA with WBC 13.3. pt hasgreenish material drainage around the from G-tube, cannot rule out infection. CT-abd/pelvis is limited, but did not showdefinite drainable fluid collection or abscess identified in the anterior abdominal wall or adjacent to the gastrostomy. -started on doxycycline empirically,. continue -Follow up blood cultures  Will continue to follow and update hospice team. Please call with any hospice related questions or discharge needs.  Flo Shanks BSN, [place] [phone]
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
## 2020-06-26 DIAGNOSIS — Z515 Encounter for palliative care: Secondary | ICD-10-CM

## 2020-06-26 DIAGNOSIS — K9433 Esophagostomy malfunction: Secondary | ICD-10-CM

## 2020-06-26 DIAGNOSIS — R339 Retention of urine, unspecified: Secondary | ICD-10-CM

## 2020-06-26 LAB — CBC
HCT: 32.2 % — ABNORMAL LOW (ref 39.0–52.0)
Hemoglobin: 10.4 g/dL — ABNORMAL LOW (ref 13.0–17.0)
MCH: 30.7 pg (ref 26.0–34.0)
MCHC: 32.3 g/dL (ref 30.0–36.0)
MCV: 95 fL (ref 80.0–100.0)
Platelets: 187 10*3/uL (ref 150–400)
RBC: 3.39 MIL/uL — ABNORMAL LOW (ref 4.22–5.81)
RDW: 15.2 % (ref 11.5–15.5)
WBC: 5.3 10*3/uL (ref 4.0–10.5)
nRBC: 0 % (ref 0.0–0.2)

## 2020-06-26 LAB — BASIC METABOLIC PANEL
Anion gap: 9 (ref 5–15)
BUN: 17 mg/dL (ref 6–20)
CO2: 29 mmol/L (ref 22–32)
Calcium: 8.6 mg/dL — ABNORMAL LOW (ref 8.9–10.3)
Chloride: 104 mmol/L (ref 98–111)
Creatinine, Ser: 0.42 mg/dL — ABNORMAL LOW (ref 0.61–1.24)
GFR calc Af Amer: >60 mL/min (ref >60)
GFR calc non Af Amer: >60 mL/min (ref >60)
Glucose, Bld: 118 mg/dL — ABNORMAL HIGH (ref 70–99)
Potassium: 3.9 mmol/L (ref 3.5–5.1)
Sodium: 142 mmol/L (ref 135–145)

## 2020-06-26 LAB — PHOSPHORUS: Phosphorus: 2.1 mg/dL — ABNORMAL LOW (ref 2.5–4.6)

## 2020-06-26 LAB — MAGNESIUM: Magnesium: 2.1 mg/dL (ref 1.7–2.4)

## 2020-06-26 MED ORDER — OSMOLITE 1.5 CAL PO LIQD: 1000.0000 mL | ORAL | 0 refills | Status: AC

## 2020-06-26 MED ORDER — FINASTERIDE 5 MG PO TABS: 5.0000 mg | ORAL_TABLET | Freq: Every day | ORAL | Status: AC

## 2020-06-26 MED ORDER — TAMSULOSIN HCL 0.4 MG PO CAPS: 0.4000 mg | ORAL_CAPSULE | Freq: Every day | ORAL | 0 refills | Status: AC

## 2020-06-26 MED ORDER — MIDODRINE HCL 10 MG PO TABS: 10.0000 mg | ORAL_TABLET | Freq: Three times a day (TID) | ORAL | Status: AC

## 2020-06-26 MED ORDER — DOXYCYCLINE HYCLATE 100 MG PO TBEC: 100.0000 mg | DELAYED_RELEASE_TABLET | Freq: Two times a day (BID) | ORAL | 0 refills | Status: AC

## 2020-06-26 MED ORDER — HYDROCODONE-ACETAMINOPHEN 7.5-325 MG PO TABS: 1.0000 | ORAL_TABLET | Freq: Four times a day (QID) | ORAL | 0 refills | Status: AC | PRN

## 2020-06-26 MED ORDER — FREE WATER: 300.0000 mL | Freq: Four times a day (QID) | 0 refills | Status: AC

## 2020-06-26 NOTE — Plan of Care (Signed)
Discharge order received. Patient mental status is at baseline. Vital signs stable . No signs of acute distress. Discharge instructions given. Patient verbalized understanding. No other issues noted at this time.   
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
## 2020-06-26 NOTE — TOC Transition Note (Signed)
Transition of Care [facility]) - CM/SW Discharge Note   Patient Details  Name: Thomas [name] MRN: [id number] Date of Birth: December 06, 1971  Transition of Care [facility]) CM/SW Contact:  Candie Chroman, LCSW Phone Number: 06/26/2020, 10:02 AM   Clinical Narrative: Patient has orders to discharge to [facility] today. RN will call report to [phone]. EMS transport has been arranged for 11:00. [facility] liaison is aware. No further concerns. CSW signing off.    Final next level of care: Skilled Nursing Facility Barriers to Discharge: Barriers Resolved   Patient Goals and CMS Choice     Choice offered to / list presented to : NA  Discharge Placement   Existing PASRR number confirmed : 06/25/20          Patient [facility] bed at: [facility] Patient to be transferred to facility by: EMS Name of family member notified: Doneta Public Patient and family notified of of transfer: 06/26/20  Discharge Plan and Services     Post Acute Care Choice: [place]                               Social Determinants of Health (SDOH) Interventions     Readmission Risk Interventions Readmission Risk Prevention Plan 06/25/2020 05/23/2020 05/22/2020  Transportation Screening Complete - Complete  PCP or Specialist Appt within 3-5 Days - - -  [place]RI or [place] for [place] - - -  Medication Review Press photographer) Complete - Complete  PCP or Specialist appointment within 3-5 days of discharge Complete (No Data) -  [place] or Home Care Consult - - Complete  SW Recovery Care/Counseling Consult Complete - -  Palliative Care Screening Complete - -  Skilled Nursing Facility Complete - Complete  Some recent data might be hidden

## 2020-06-27 ENCOUNTER — Telehealth: Payer: Self-pay | Admitting: *Deleted

## 2020-06-27 IMAGING — RF IR REPLACE G/J TUBE W/ FLUORO
2 series · 5 of 5 positions shown · non-contrast
Comparison: none

INDICATION: History of esophageal atresia with chronic indwelling percutaneous
gastrostomy tube. Significant leakage around current gastrostomy
tube which was exchanged at [facility] on 02/09/2020.

[Series 1: cp_standard · 0.18mm/px · 1 of 1 slices shown (1 of 2)]
[im 1/1]
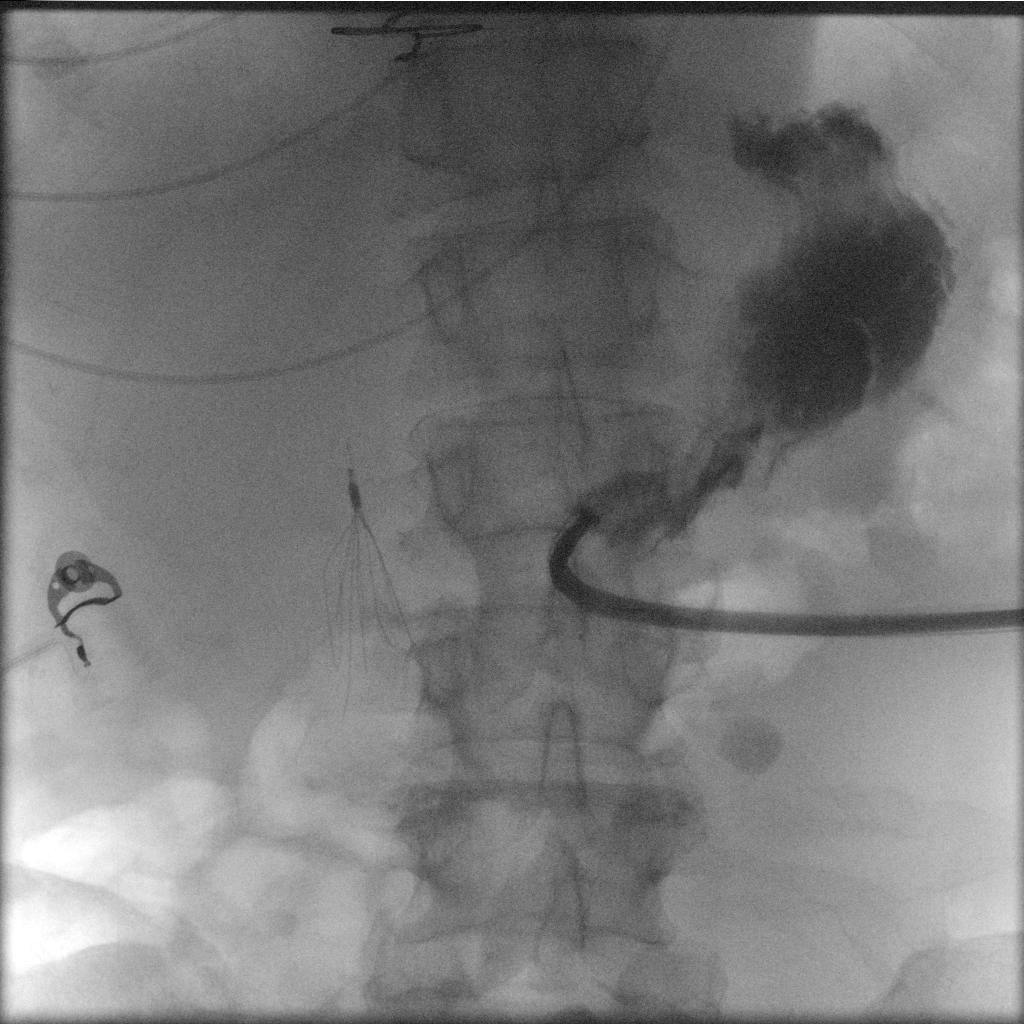

[Series 2: cp_standard · 0.18mm/px · 4 of 46 frames shown (2 of 2)]
[frame 7/46]
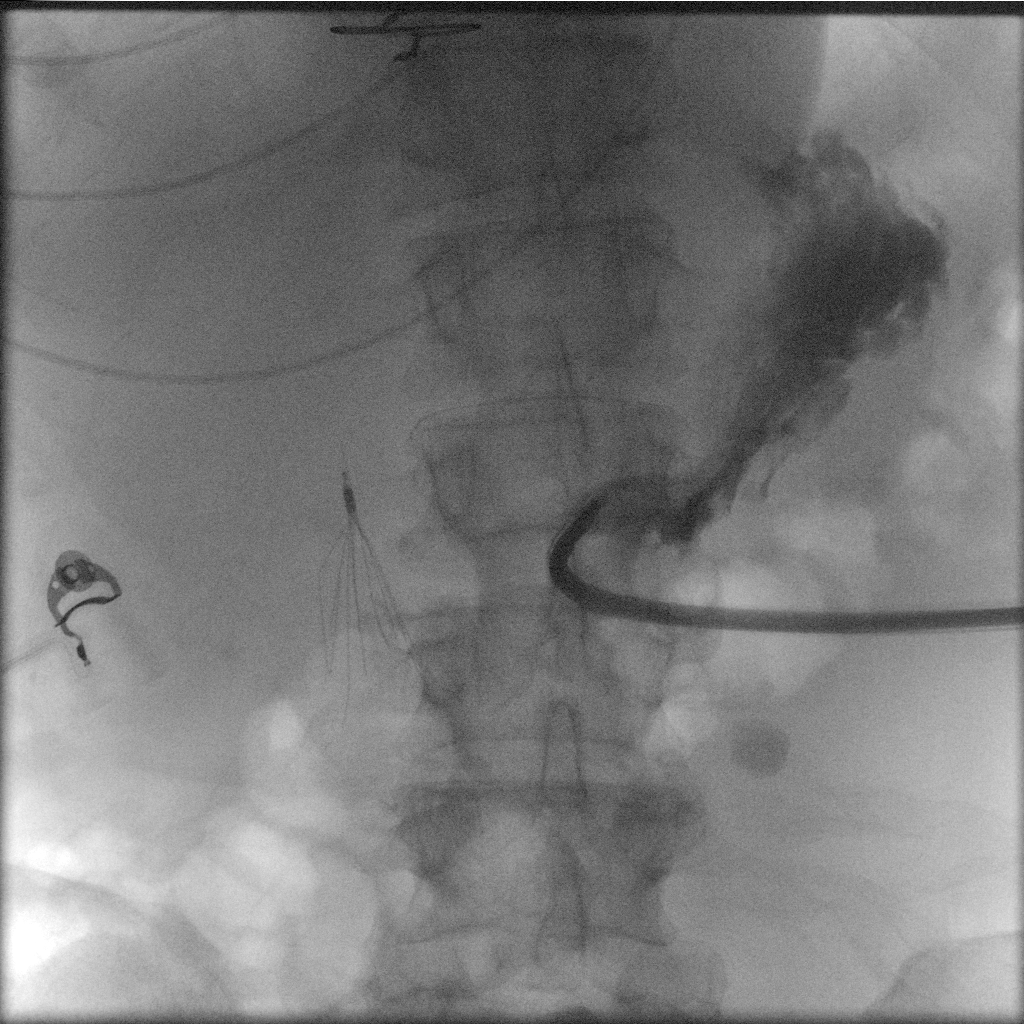
[frame 16/46]
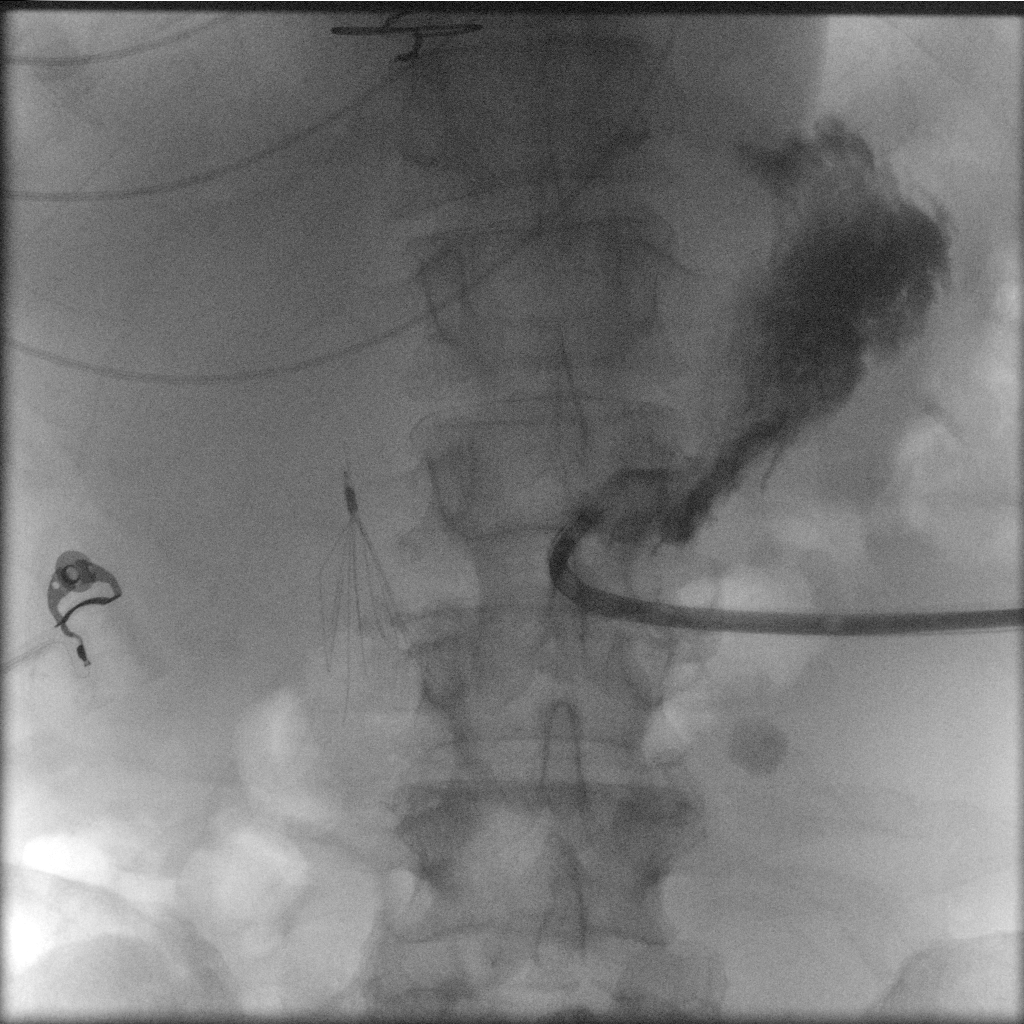
[frame 24/46]
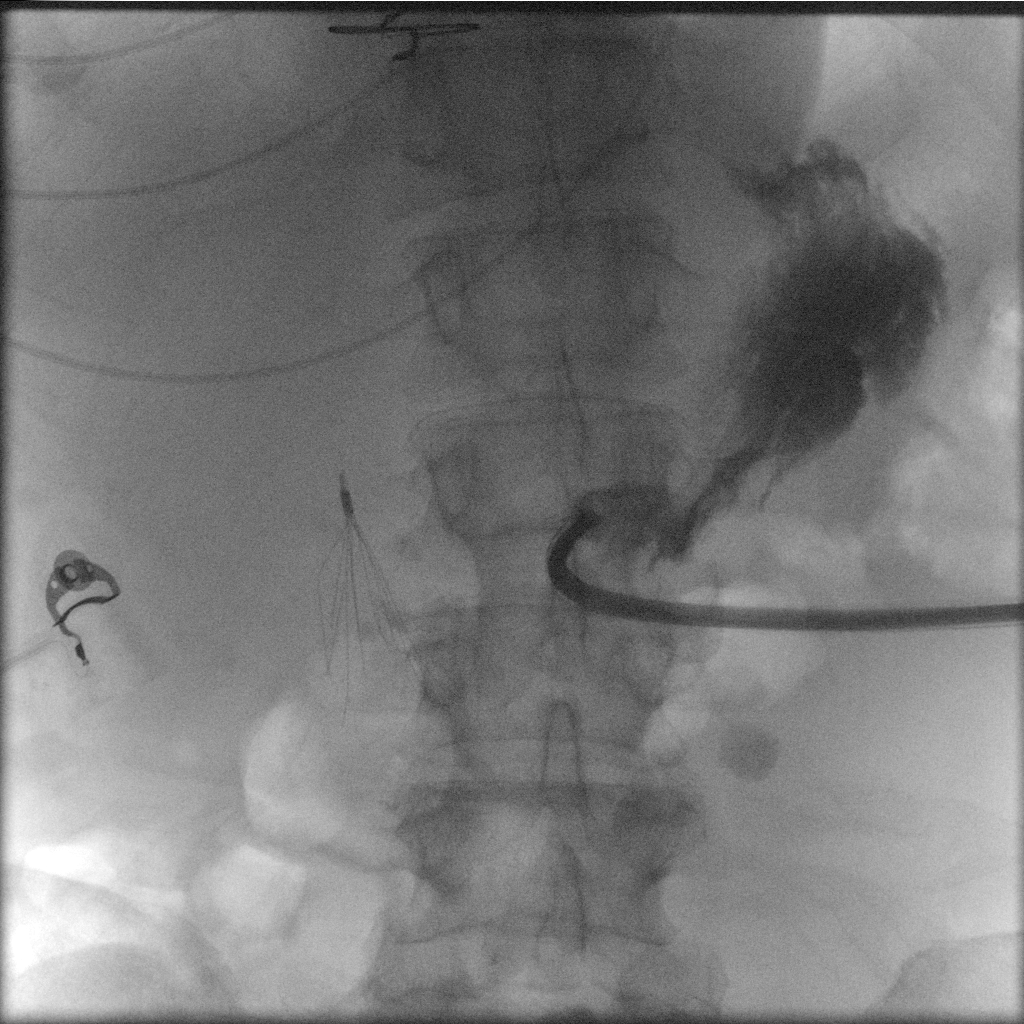
[frame 40/46]
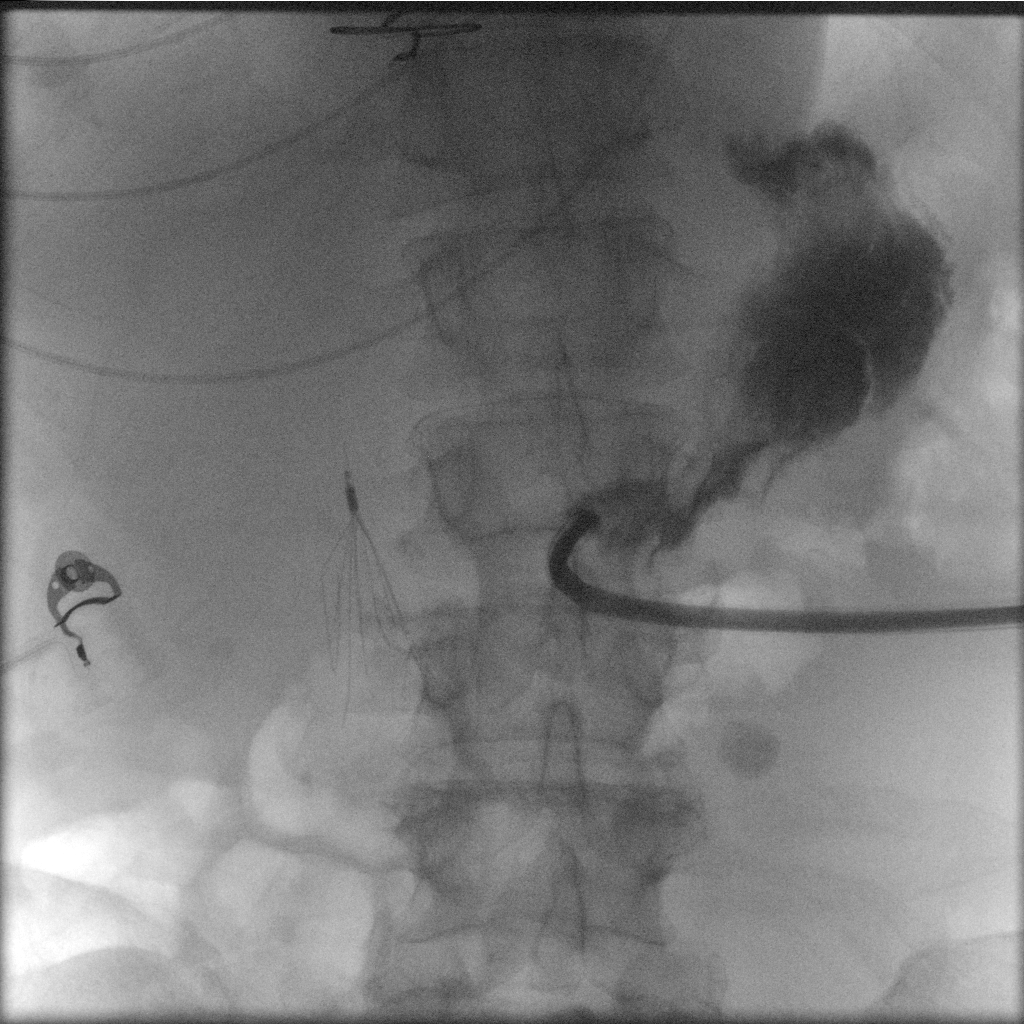

[5 of 5 positions shown; findings below may reference images not displayed]

EXAM:
EXCHANGE OF GASTROSTOMY TUBE UNDER FLUOROSCOPY

MEDICATIONS:
None

ANESTHESIA/SEDATION:
None

CONTRAST:  10 mL Omnipaque 300-administered into the gastric lumen.

FLUOROSCOPY TIME:  Fluoroscopy Time: 12 seconds.  2.3 mGy.

COMPLICATIONS:
None immediate.

PROCEDURE:
Informed written consent was obtained from the patient after a
thorough discussion of the procedural risks, benefits and
alternatives. All questions were addressed. Maximal Sterile Barrier
Technique was utilized including caps, mask, sterile gowns, sterile
gloves, sterile drape, hand hygiene and skin antiseptic. A timeout
was performed prior to the initiation of the procedure.

The pre-existing gastrostomy tube was inspected after removal of an
overlying dressing. The retention balloon was deflated and the
entire tube removed. A new 20 French standard balloon retention
gastrostomy tube was advanced through the percutaneous tract and
into the stomach. The retention balloon was inflated with 8 mL of
saline. The tube was injected with contrast material under
fluoroscopy to confirm positioning.
FINDINGS: The current 18 Deondra Beth gastrostomy tube appears
intact. The stoma length of 2.7 cm is clearly too long for the
patient's body habitus with a significant portion of the stoma
protruding out of the abdominal skin exit site. This is contributing
to leakage problems around the tube as well as some stretching of
the gastrostomy tube tract.

A new 20 French standard balloon retention gastrostomy tube was
easily advanced through the tract into the stomach. The tube tip
lies in the body of the stomach. The retention disc was brought
under tension at the skin exit site with use of a drain sponge
between the retention disc and the skin.
IMPRESSION: Removal of 18 French low-profile gastrostomy tube and replacement
with a new 20 French standard balloon retention gastrostomy tube.
This tube tip lies in the body of the stomach which was confirmed
under fluoroscopy with contrast injection. This catheter is ready
for immediate use.

## 2020-06-27 NOTE — Telephone Encounter (Signed)
Received phone call from Jewel Baize [phone] at [facility]. Patient scheduled for Cysto tomorrow. She does not feel it is necessary to proceed with cysto based on being on Hospice. Placed on Hospice 06/20/20. Would like to discuss further for management options if necessary.

## 2020-06-28 ENCOUNTER — Other Ambulatory Visit: Payer: Self-pay | Admitting: Urology

## 2020-06-28 LAB — CULTURE, BLOOD (ROUTINE X 2)
Culture: NO GROWTH
Culture: NO GROWTH
Special Requests: ADEQUATE

## 2020-06-28 NOTE — Telephone Encounter (Signed)
Okay to cancel-we can see him back as needed

## 2020-07-04 IMAGING — DX DG ABDOMEN 1V
1 series · 1 of 1 positions shown · non-contrast
Comparison: Fluoroscopic guided gastrostomy tube exchange and up
sizing-04/01/2020

CLINICAL DATA: Cracked hub of existing recently exchanged and
upsized gastrostomy tube.

As such, bedside gastrostomy tube exchange was performed and
followed by the administration of enteric contrast to confirm
placement.
EXAM:
ABDOMEN - 1 VIEW

[abdomen supine]
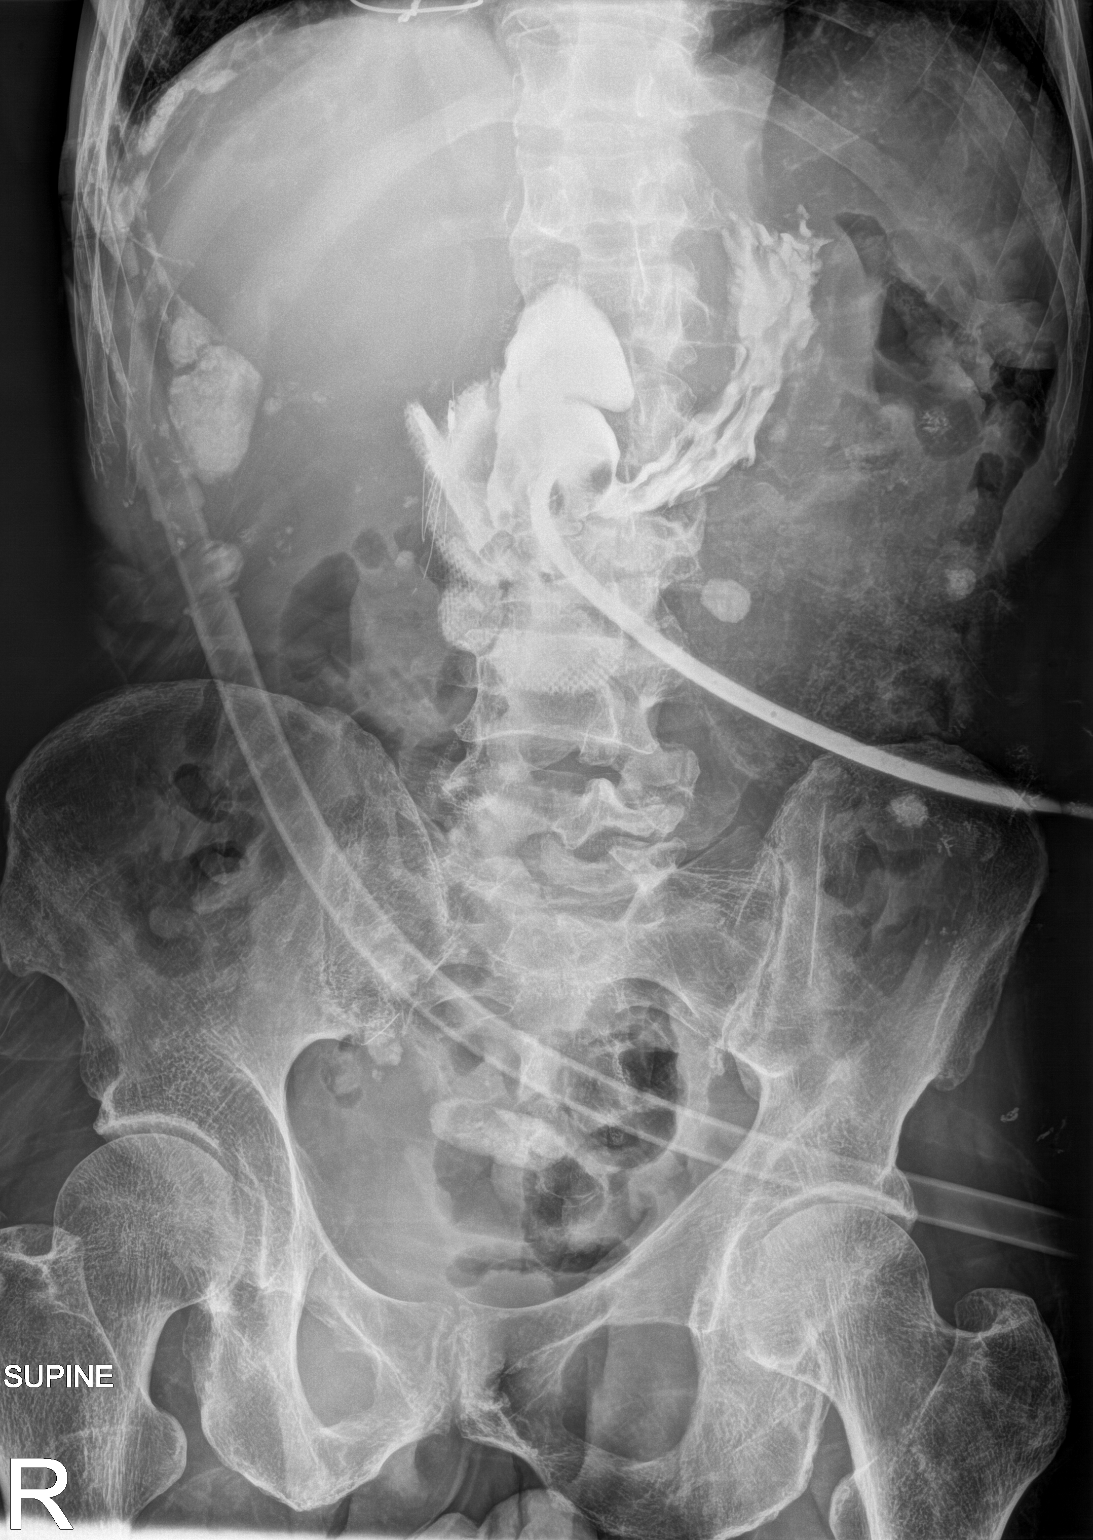

[1 of 1 positions shown; findings below may reference images not displayed]

FINDINGS: Contrast has been injected via the existing gastrostomy tube
demonstrating appropriate positioning within the gastric antrum with
opacification of adjacent gastric rugae a.

There is reflux of contrast along the catheter tract with
opacification of overlying dressing material.

Redemonstrated dystrophic calcifications within the right upper
abdominal quadrant and scattered calcified mesenteric lymph nodes.

Paucity of bowel gas without evidence of enteric obstruction

Post median sternotomy.

No acute osseous abnormalities.
IMPRESSION: Appropriately positioned gastrostomy tube with findings again
suggestive of persistent leakage about the gastrostomy track.

While the gastrostomy tube is ready for immediate use, would
recommend smaller volumes administered at a slow rate with special
attention to patient positioning in order to minimize pericatheter
leakage.

Ultimately, the patient may have to undergo gastrostomy tube
exchange and up sizing, however at the present time, there are no
larger gastrostomy tubes available at this institution.

## 2020-07-17 IMAGING — DX DG ABD PORTABLE 1V
1 series · 1 of 1 positions shown · non-contrast
Comparison: 04/08/2020, CT 03/29/2019

CLINICAL DATA: G-tube placement

EXAM:
PORTABLE ABDOMEN - 1 VIEW

[abdomen supine]
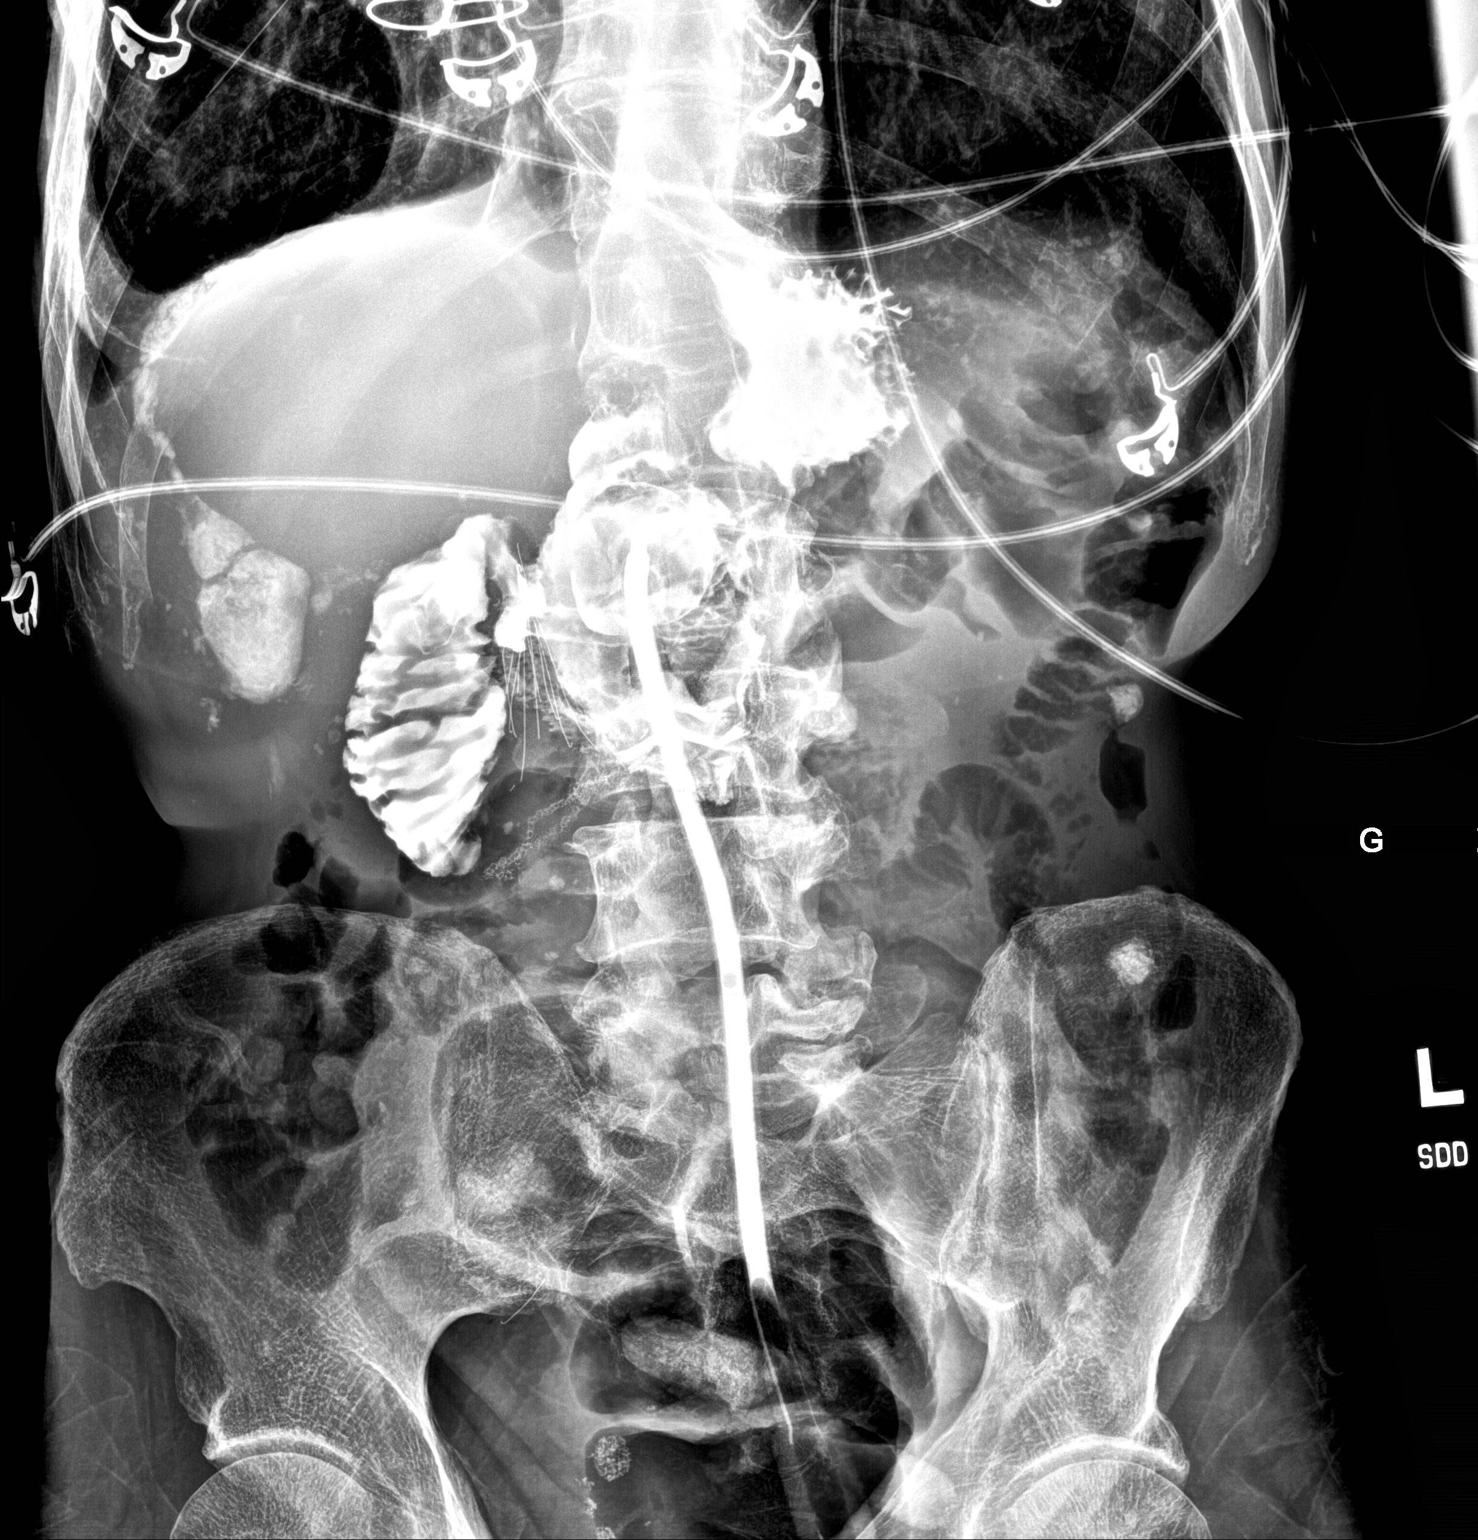

[1 of 1 positions shown; findings below may reference images not displayed]

FINDINGS: Gastrostomy balloon projects over the body of the stomach. There is
opacification of the gastric fundus and proximal duodenum. Amorphous
contrast about the balloon. Scattered peritoneal calcifications as
before. Nonobstructed gas pattern.
IMPRESSION: 1. Gastrostomy balloon projects over the body of the stomach
2. Amorphous contrast about the balloon, potentially representing
contrast leakage about the gastrostomy tract

## 2020-07-17 IMAGING — CR DG CHEST 1V
1 series · 1 of 1 positions shown · non-contrast
Comparison: Most recent radiograph 03/30/2020. Most recent CT
02/05/2020

CLINICAL DATA: Shortness of breath.

EXAM:
CHEST  1 VIEW

[chest ap]
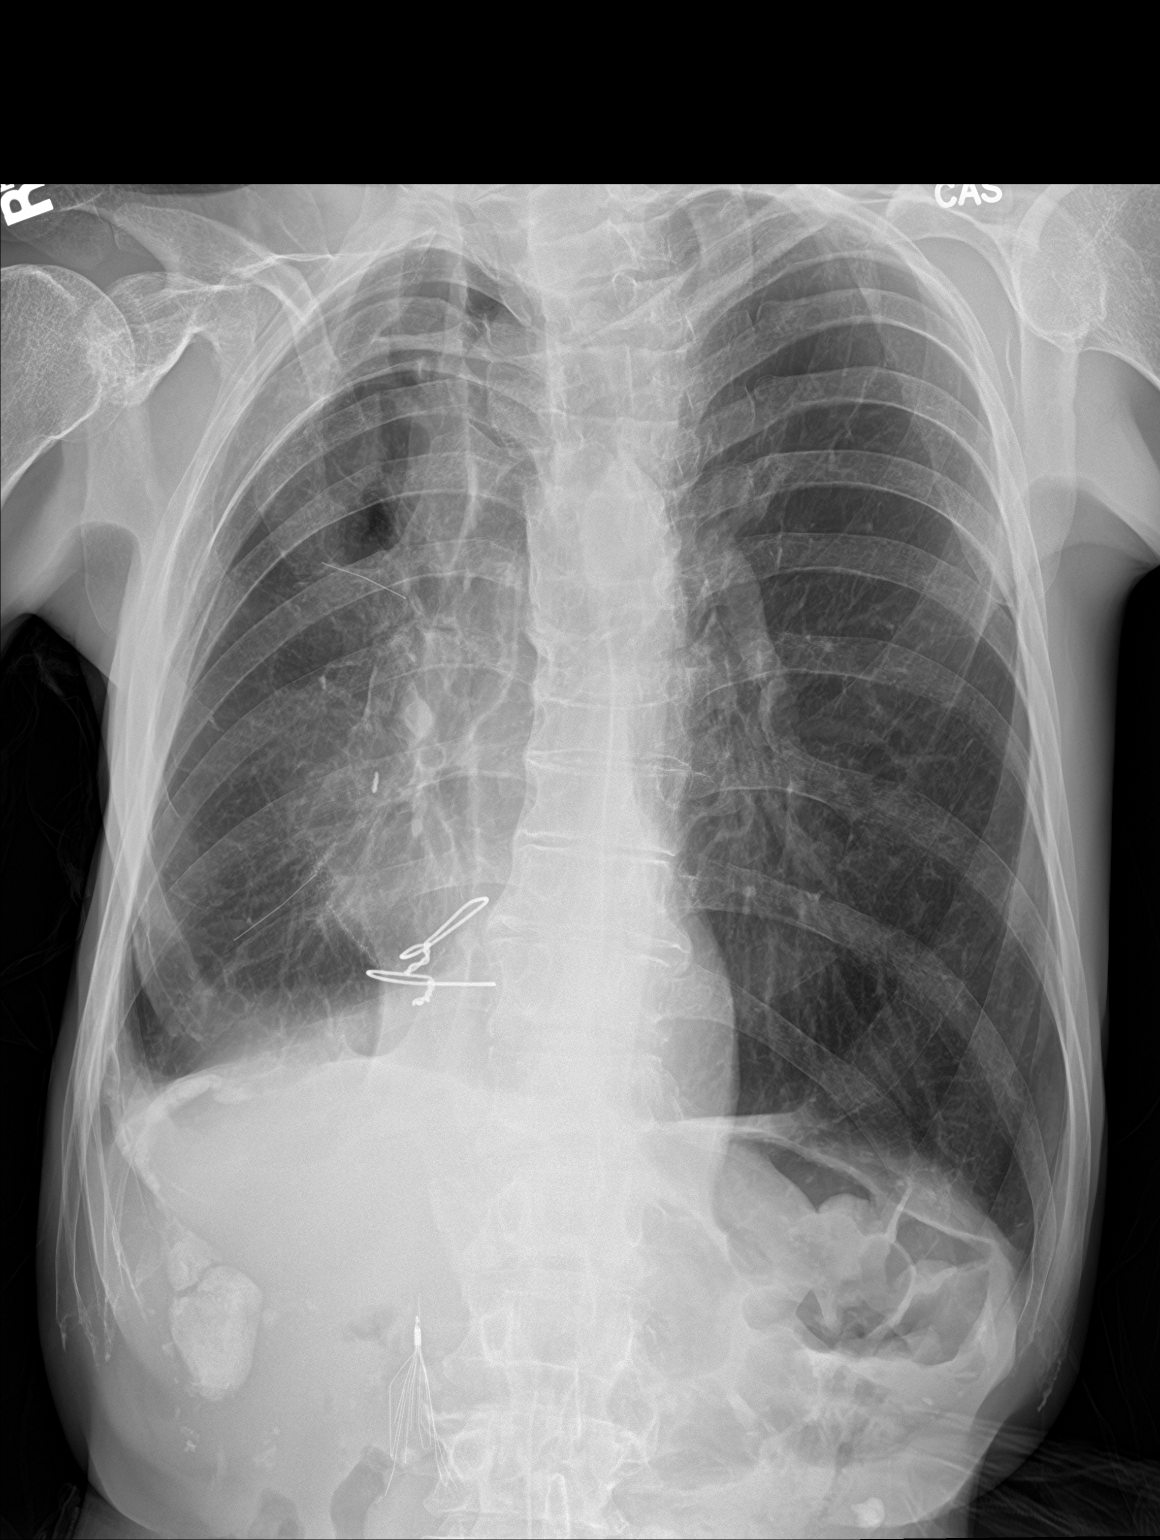

[1 of 1 positions shown; findings below may reference images not displayed]

FINDINGS: Chronic volume loss in the right hemithorax. Postsurgical change at
the right lung base with surgical clips, chain sutures, and lower
median sternotomy wires. Stable right basilar scarring with
curvilinear calcifications tracking into the right upper quadrant,
unchanged from prior exam. Normal heart size with unchanged
mediastinal contours. No acute or focal airspace disease. Mild
chronic hyperinflation. No pulmonary edema, pneumothorax, or large
pleural effusion. IVC filter is in place. No acute osseous
abnormalities are seen.
IMPRESSION: 1. Stable radiographic appearance of the chest. No acute
abnormality.
2. Scarring and postsurgical change in the right hemithorax.

## 2020-07-18 IMAGING — RF DG REPLC DUODEN/JEJUNO TUBE PERCUT W/FLUORO
6 series · 6 of 6 positions shown · non-contrast
Comparison: none

INDICATION: 49-year-old with leaking gastrostomy tube.

[Series 1: fluoro_iodine 2fps_bw · 0.19mm/px · 1 of 1 slices shown]
[im 1/1]
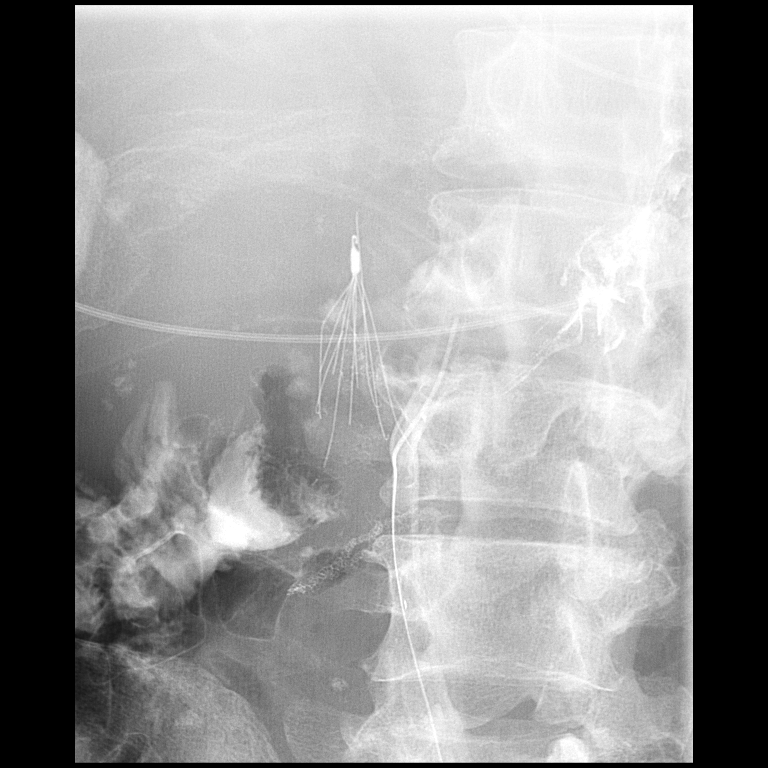

[Series 2: cp_standard · 0.19mm/px · 1 of 1 slices shown (1 of 5)]
[im 1/1]
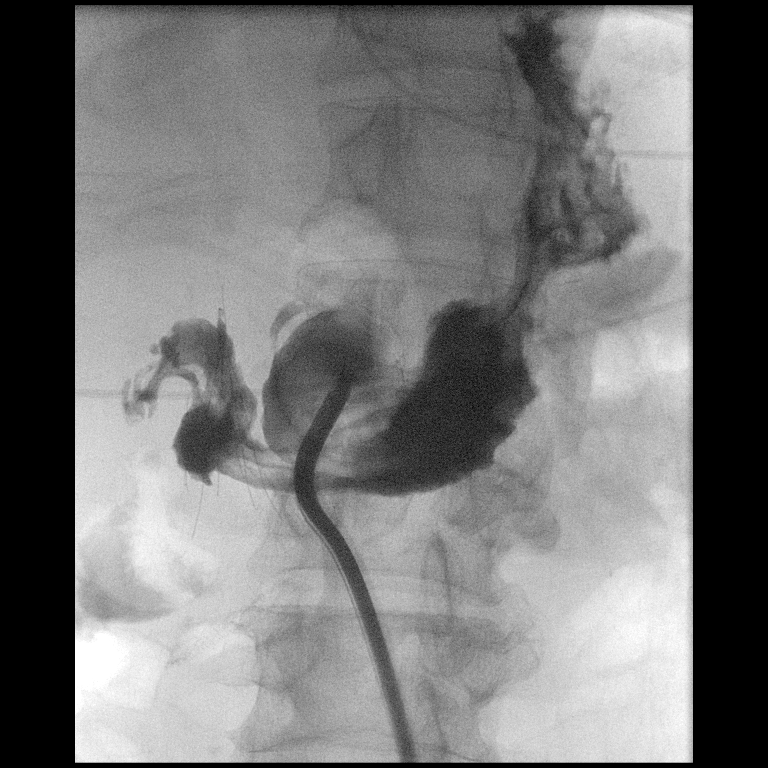

[Series 3: cp_standard · 0.19mm/px · 1 of 1 slices shown (2 of 5)]
[im 1/1]
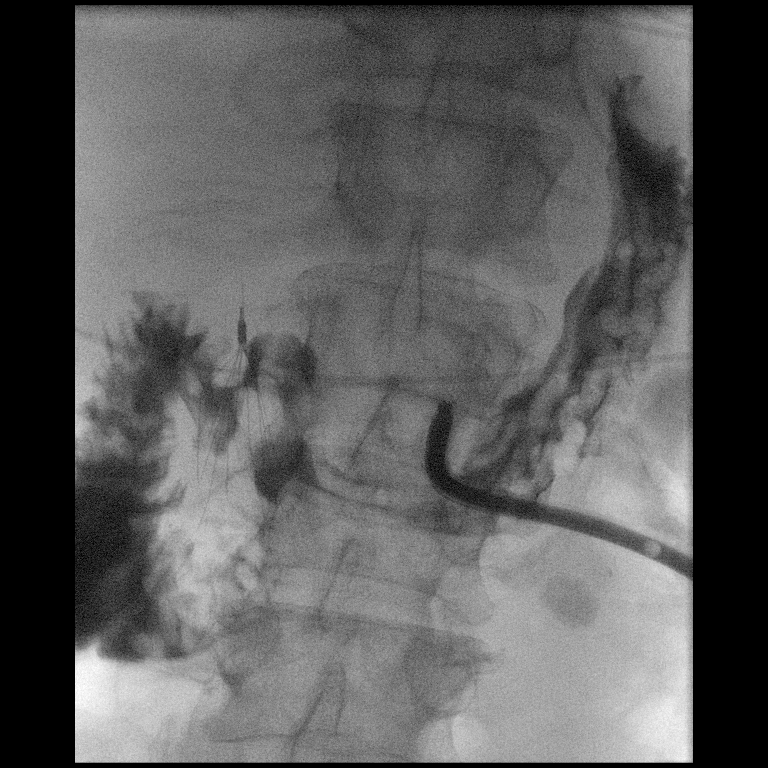

[Series 4: cp_standard · 0.18mm/px · 1 of 1 slices shown (3 of 5)]
[im 1/1]
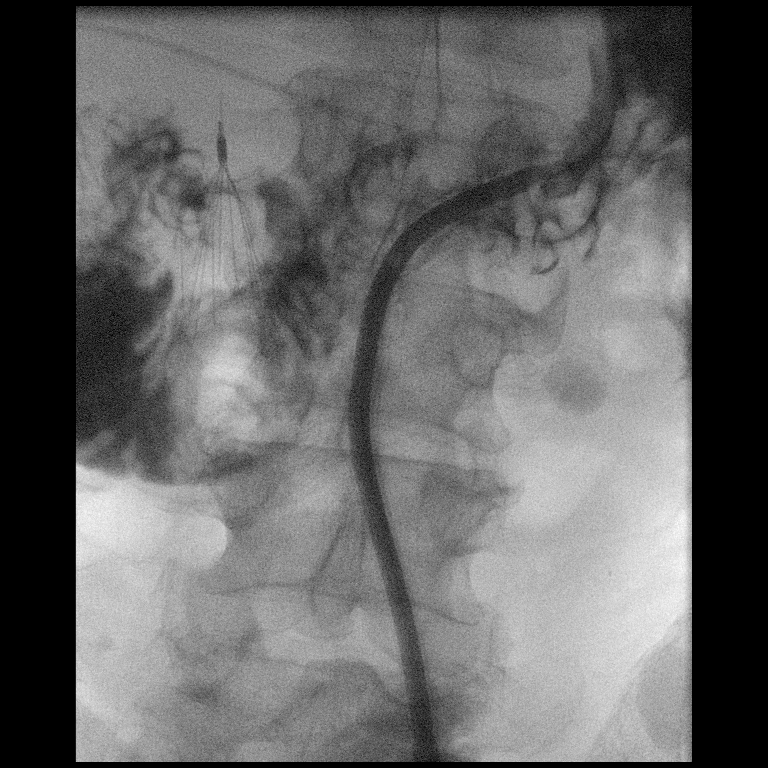

[Series 5: cp_standard · 0.18mm/px · 1 of 1 slices shown (4 of 5)]
[im 1/1]
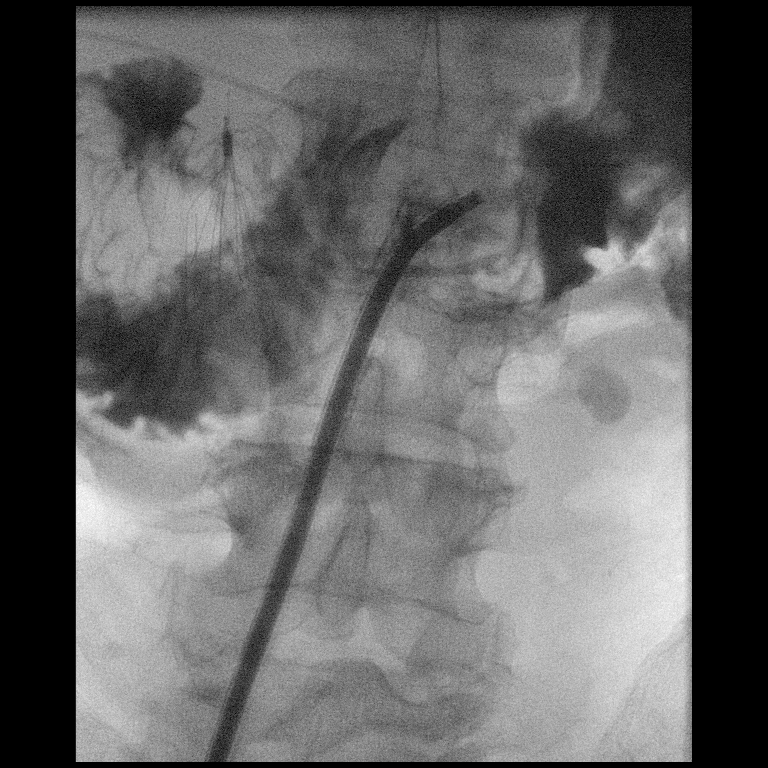

[Series 6: cp_standard · 0.18mm/px · 1 of 1 slices shown (5 of 5)]
[im 1/1]
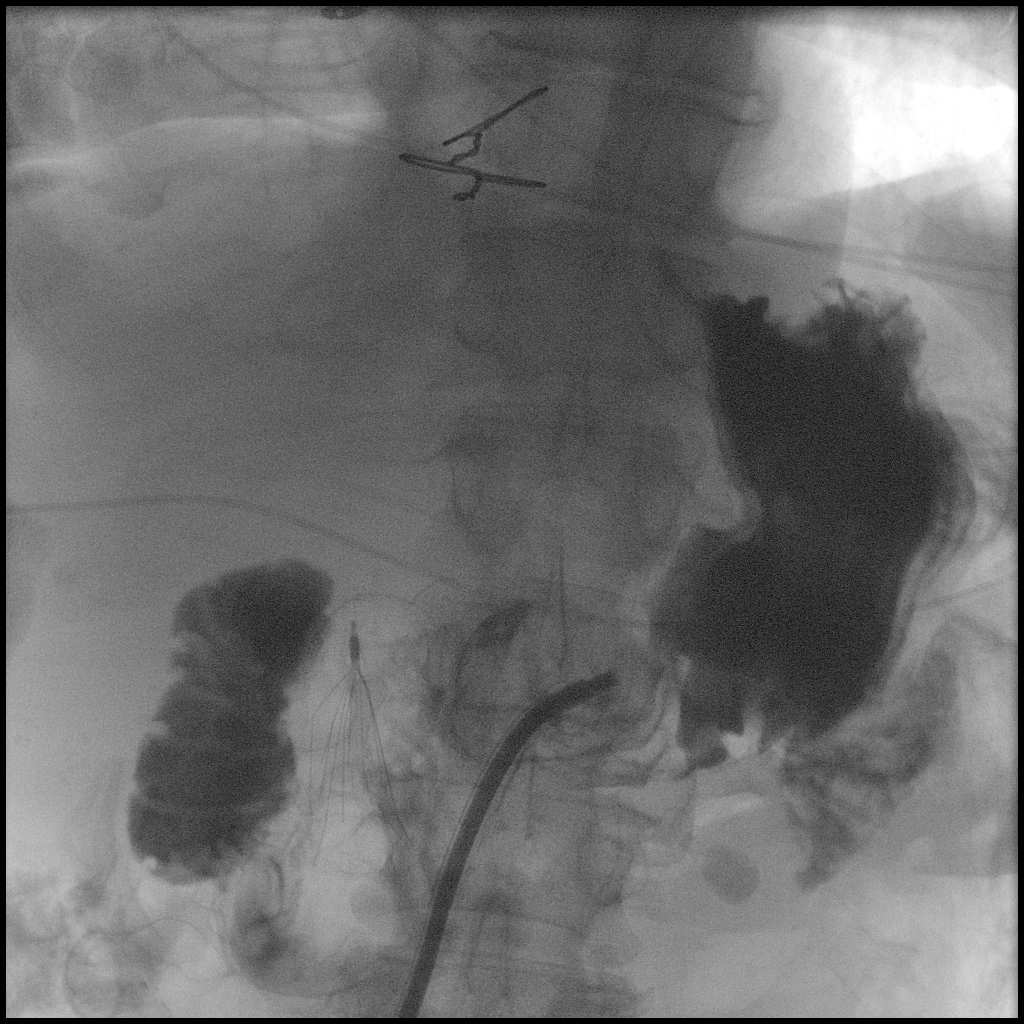

[6 of 6 positions shown; findings below may reference images not displayed]

EXAM:
GASTROSTOMY TUBE INJECTION AND MANIPULATION

MEDICATIONS:
None

ANESTHESIA/SEDATION:
None

CONTRAST:  40 mL Omnipaque 300-administered into the gastric lumen.

FLUOROSCOPY TIME:  Fluoroscopy Time: 1 minutes, 13.4 mGy

COMPLICATIONS:
None immediate.

PROCEDURE:
Patient was placed supine on the interventional table. Scout image
was obtained of the abdomen. The gastrostomy tube was injected with
contrast under fluoroscopy. The balloon was deflated and partially
pulled back. The tube was advanced back into the stomach under
fluoroscopy and confirmed placement in the gastric lumen. The
balloon was inflated again. Additional contrast was injected to
confirm placement in the gastric lumen.
FINDINGS: Contrast was filling the stomach but the gastrostomy tube was not
clearly within the gastric lumen but probably in the subcutaneous
tissues. After the balloon was deflated, the gastric tube was
successfully advanced back into the gastric lumen. The balloon was
moving freely within the gastric lumen at the end of the procedure.
Contrast injection confirmed placement in the stomach.

Again noted is an abnormal IVC filter. One of the filter legs may be
broken and slightly cephalad to the filter hook. Abnormal shape of
the IVC filter.
IMPRESSION: 1. Successful repositioning of the gastrostomy tube within the
stomach lumen. Gastrostomy tube is now well positioned in the
stomach and ready for use.
2. Abnormal appearance of the IVC filter with evidence for a broken
or malpositioned filter leg.

## 2020-07-19 IMAGING — CR DG RIBS 2V*R*
2 series · 3 of 3 positions shown · non-contrast
Comparison: 04/21/2020 and previous

CLINICAL DATA: Pain without trauma

EXAM:
RIGHT RIBS - 2 VIEW

[Series 1: chest pa · 0.14mm/px · 2 of 2 slices shown]
[im 1/2]
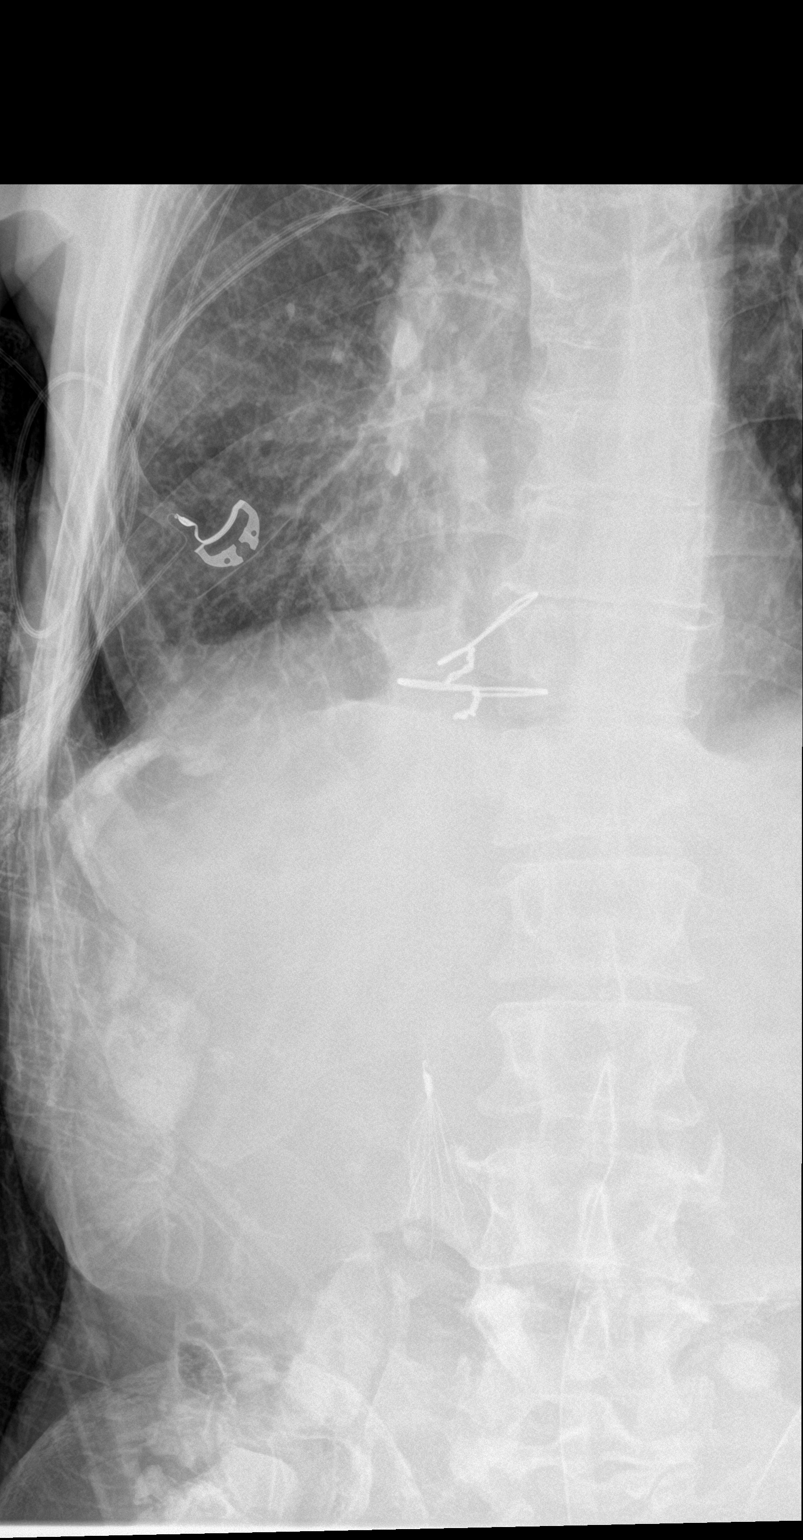
[im 2/2]
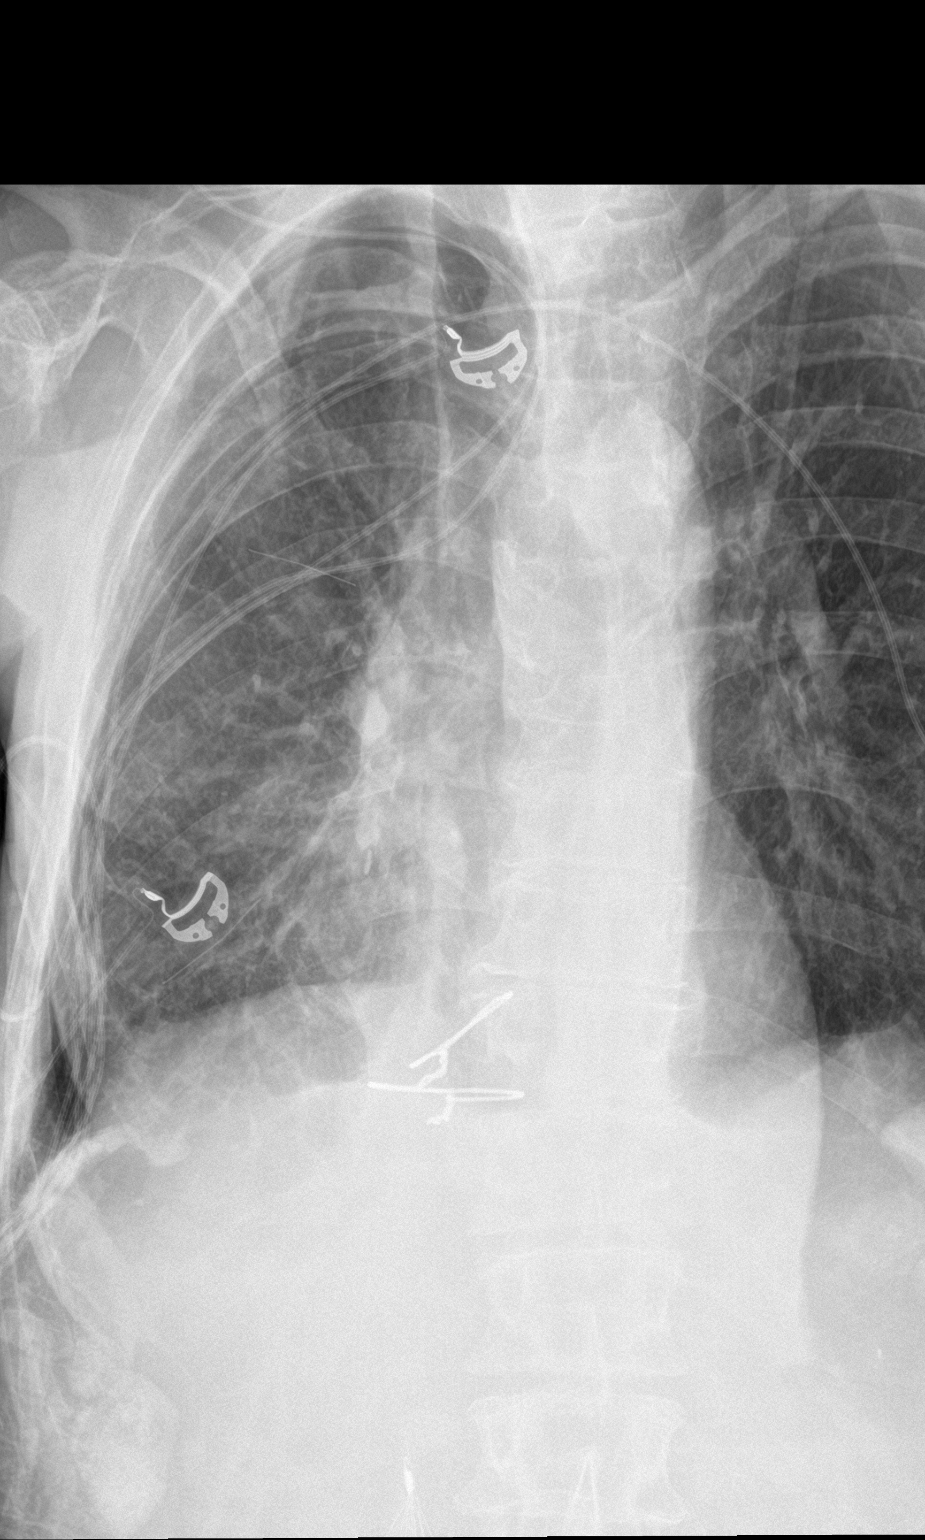

[rib ap obl]
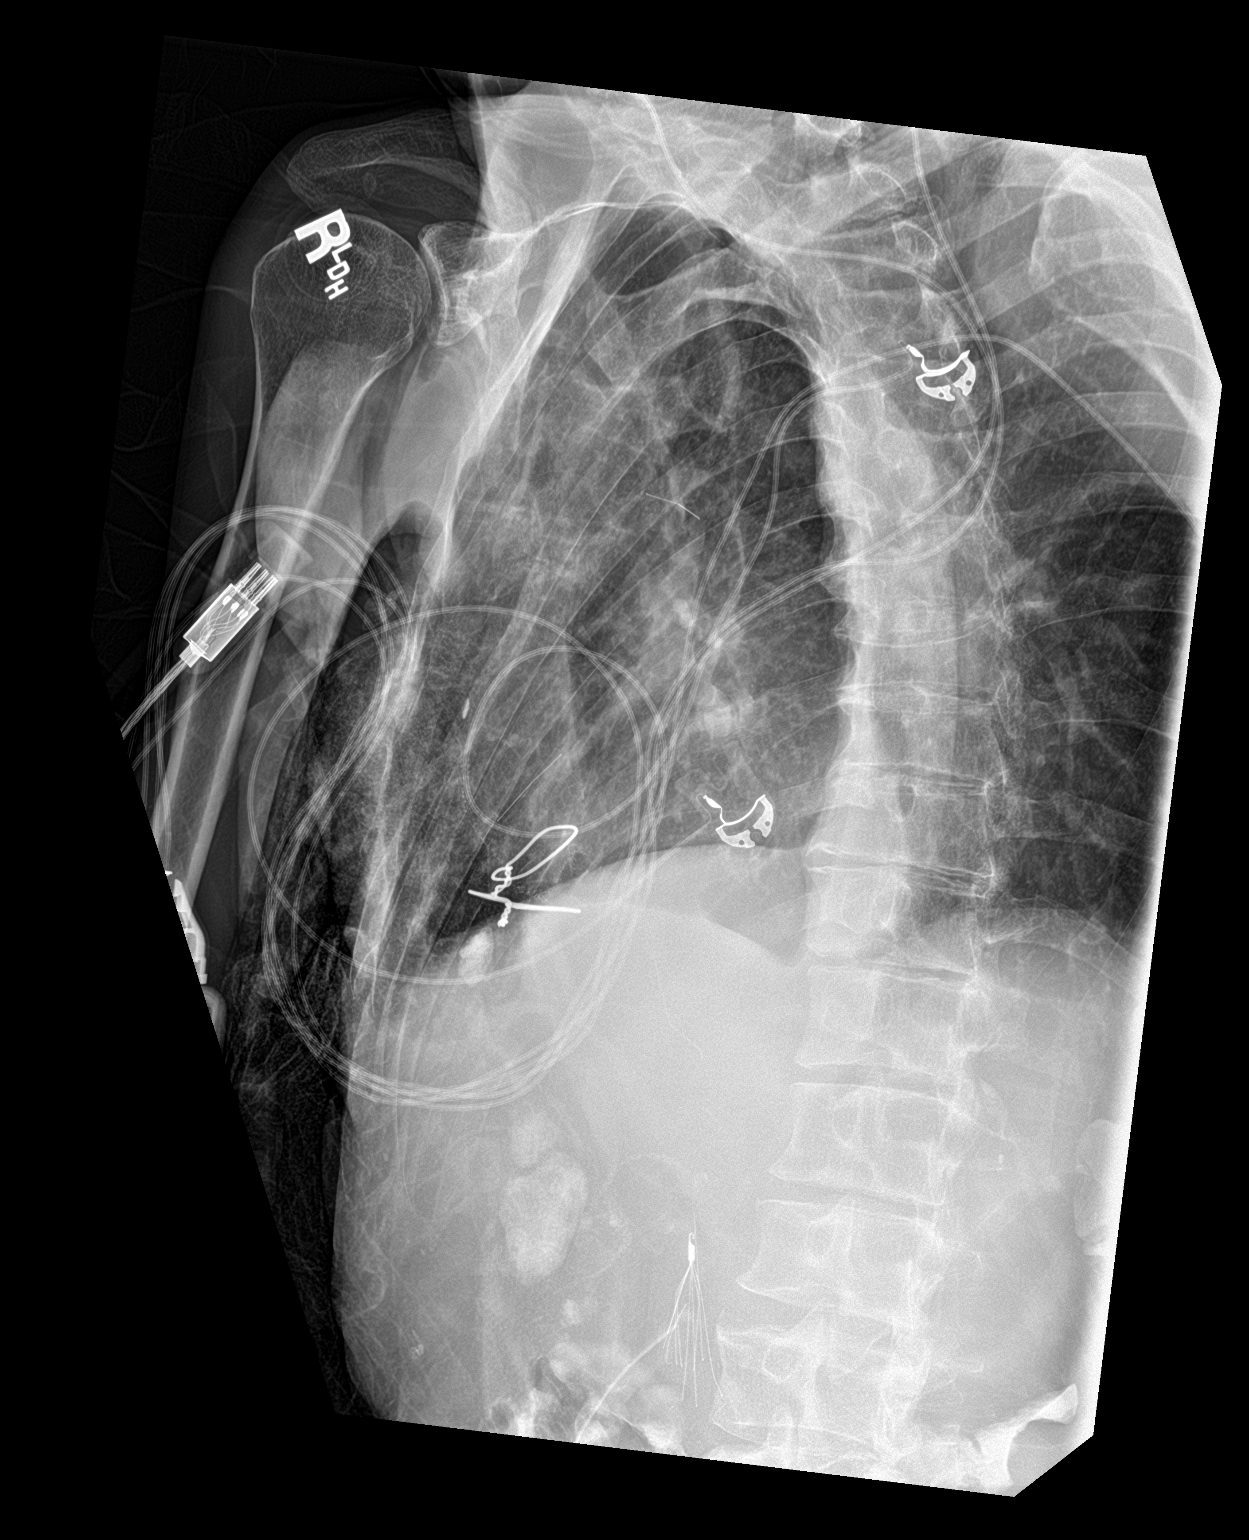

[3 of 3 positions shown; findings below may reference images not displayed]

FINDINGS: Sternotomy wires. No definite acute fracture. No definite
pneumothorax. Postop changes in the anterior right hemithorax.
Osteopenia. Retrievable IVC filter with leg fracture noted. Linear
metallic density in the right upper lobe consistent with embolized
fragment from IVC filter, present since 06/12/2019.
IMPRESSION: 1. No  fracture, pneumothorax, other acute finding.

## 2020-07-20 IMAGING — XA IR CONVERT G-TUBE TO G-JTUBE
2 series · 2 of 2 positions shown · non-contrast
Comparison: none

INDICATION: Leaking balloon retention gastrostomy

[Series 1: fluoroscopy - stored · 1 of 1 slices shown (1 of 2)]
[im 1/1]
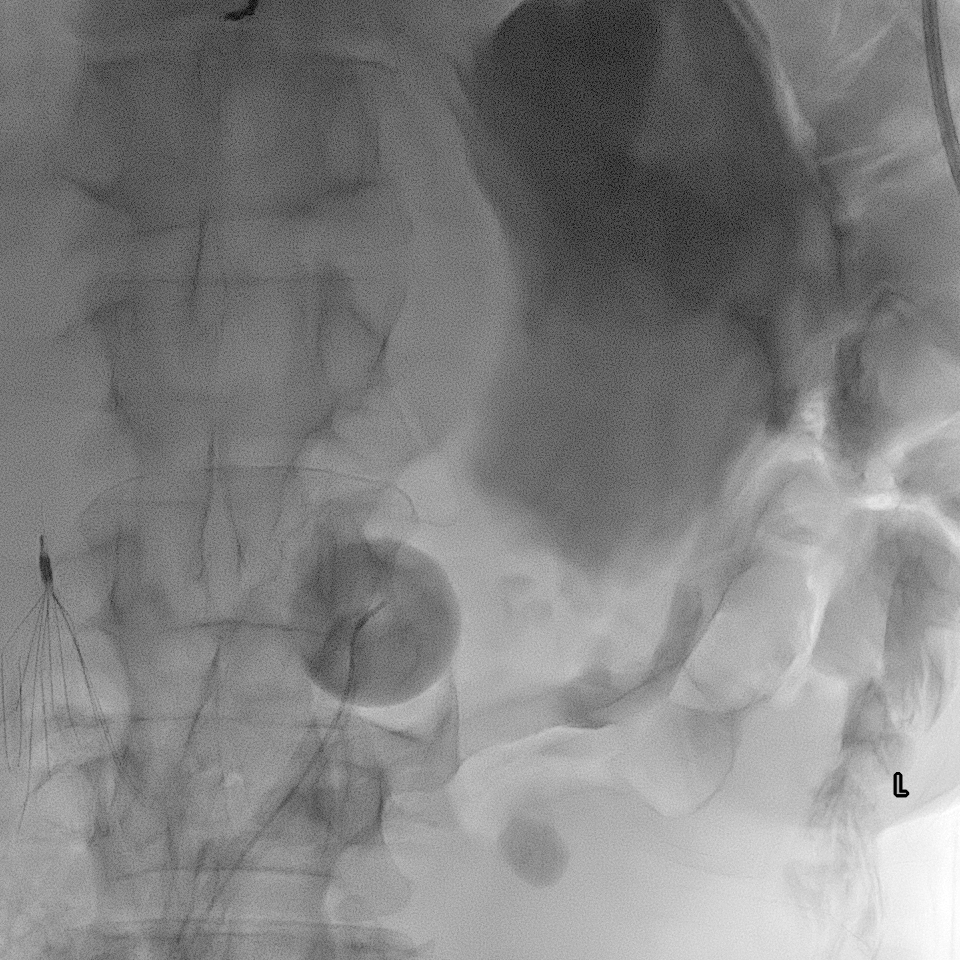

[Series 2: fluoroscopy - stored · 1 of 1 slices shown (2 of 2)]
[im 1/1]
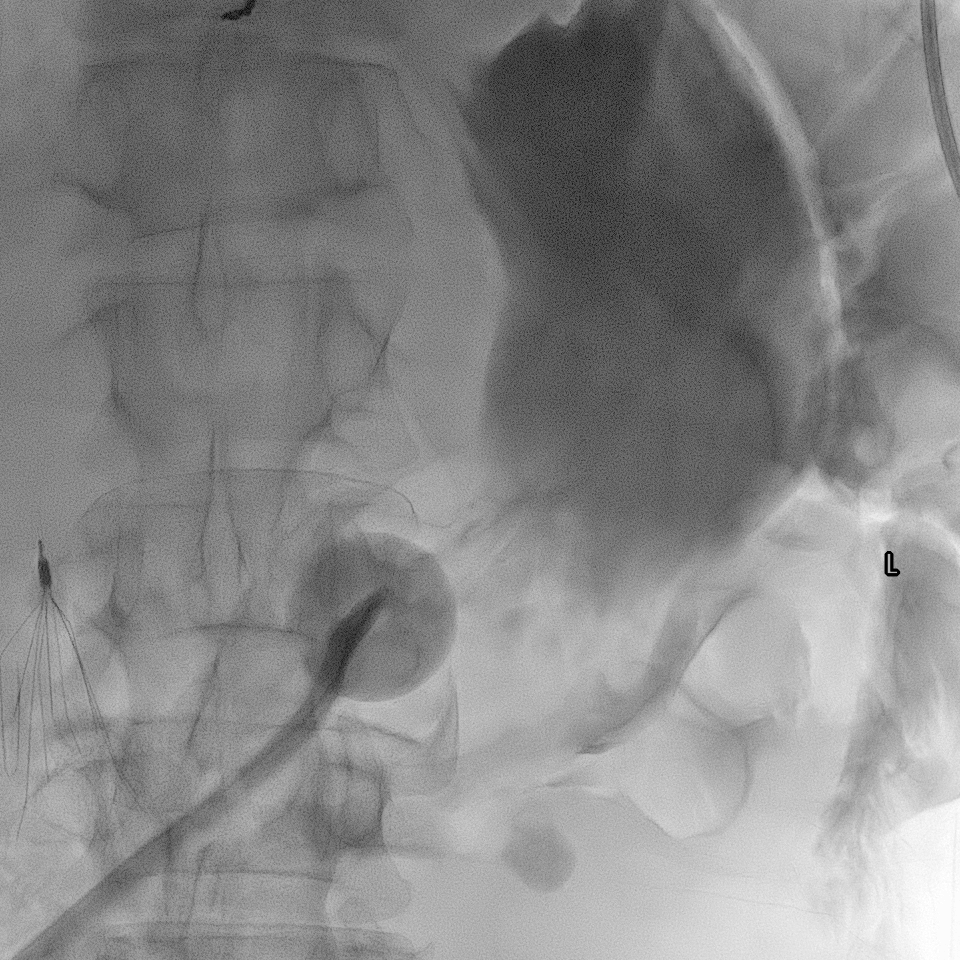

[2 of 2 positions shown; findings below may reference images not displayed]

EXAM:
FLUOROSCOPIC GASTROSTOMY EXCHANGE, UNSUCCESSFUL ATTEMPT AT
CONVERTING TO A GJ TUBE.

MEDICATIONS:
NONE.

ANESTHESIA/SEDATION:
Versed 1.0 mg IV; Fentanyl 50 mcg IV

Moderate Sedation Time:  25 minutes

The patient was continuously monitored during the procedure by the
interventional radiology nurse under my direct supervision.

CONTRAST:  20 cc-administered into the gastric lumen.

FLUOROSCOPY TIME:  Fluoroscopy Time: 4 minutes 6 seconds (25 mGy).

COMPLICATIONS:
None immediate.

PROCEDURE:
Informed written consent was obtained from the patient after a
thorough discussion of the procedural risks, benefits and
alternatives. All questions were addressed. Maximal Sterile Barrier
Technique was utilized including caps, mask, sterile gowns, sterile
gloves, sterile drape, hand hygiene and skin antiseptic. A timeout
was performed prior to the initiation of the procedure.

The existing balloon retention 20 French gastrostomy was removed
from the percutaneous tract. Attempts were made with a C2 catheter
and a Kumpe catheter as well as guidewires to access the duodenum.
After multiple attempts, this was unsuccessful.

Therefore, a new 20 French balloon retention gastrostomy was
readvanced through the percutaneous tract. Retention balloon was
inflated with a total volume of 10 cc containing 1 cc contrast. This
was retracted against anterior gastric wall and secured externally.
IMPRESSION: Unsuccessful attempt at converting to a gastrojejunostomy feeding
tube.

Fluoroscopic exchange for a 20 French balloon retention gastrostomy
as above.

If there is continued leakage, could consider conversion to a 22/24
French balloon retention feeding tube. Larger sizes have been
ordered in case of this.

## 2020-07-26 IMAGING — DX DG CHEST 1V PORT
1 series · 1 of 1 positions shown · non-contrast
Comparison: 04/23/2020, 04/21/2020

CLINICAL DATA: PICC line placement

EXAM:
PORTABLE CHEST 1 VIEW

[chest ap]
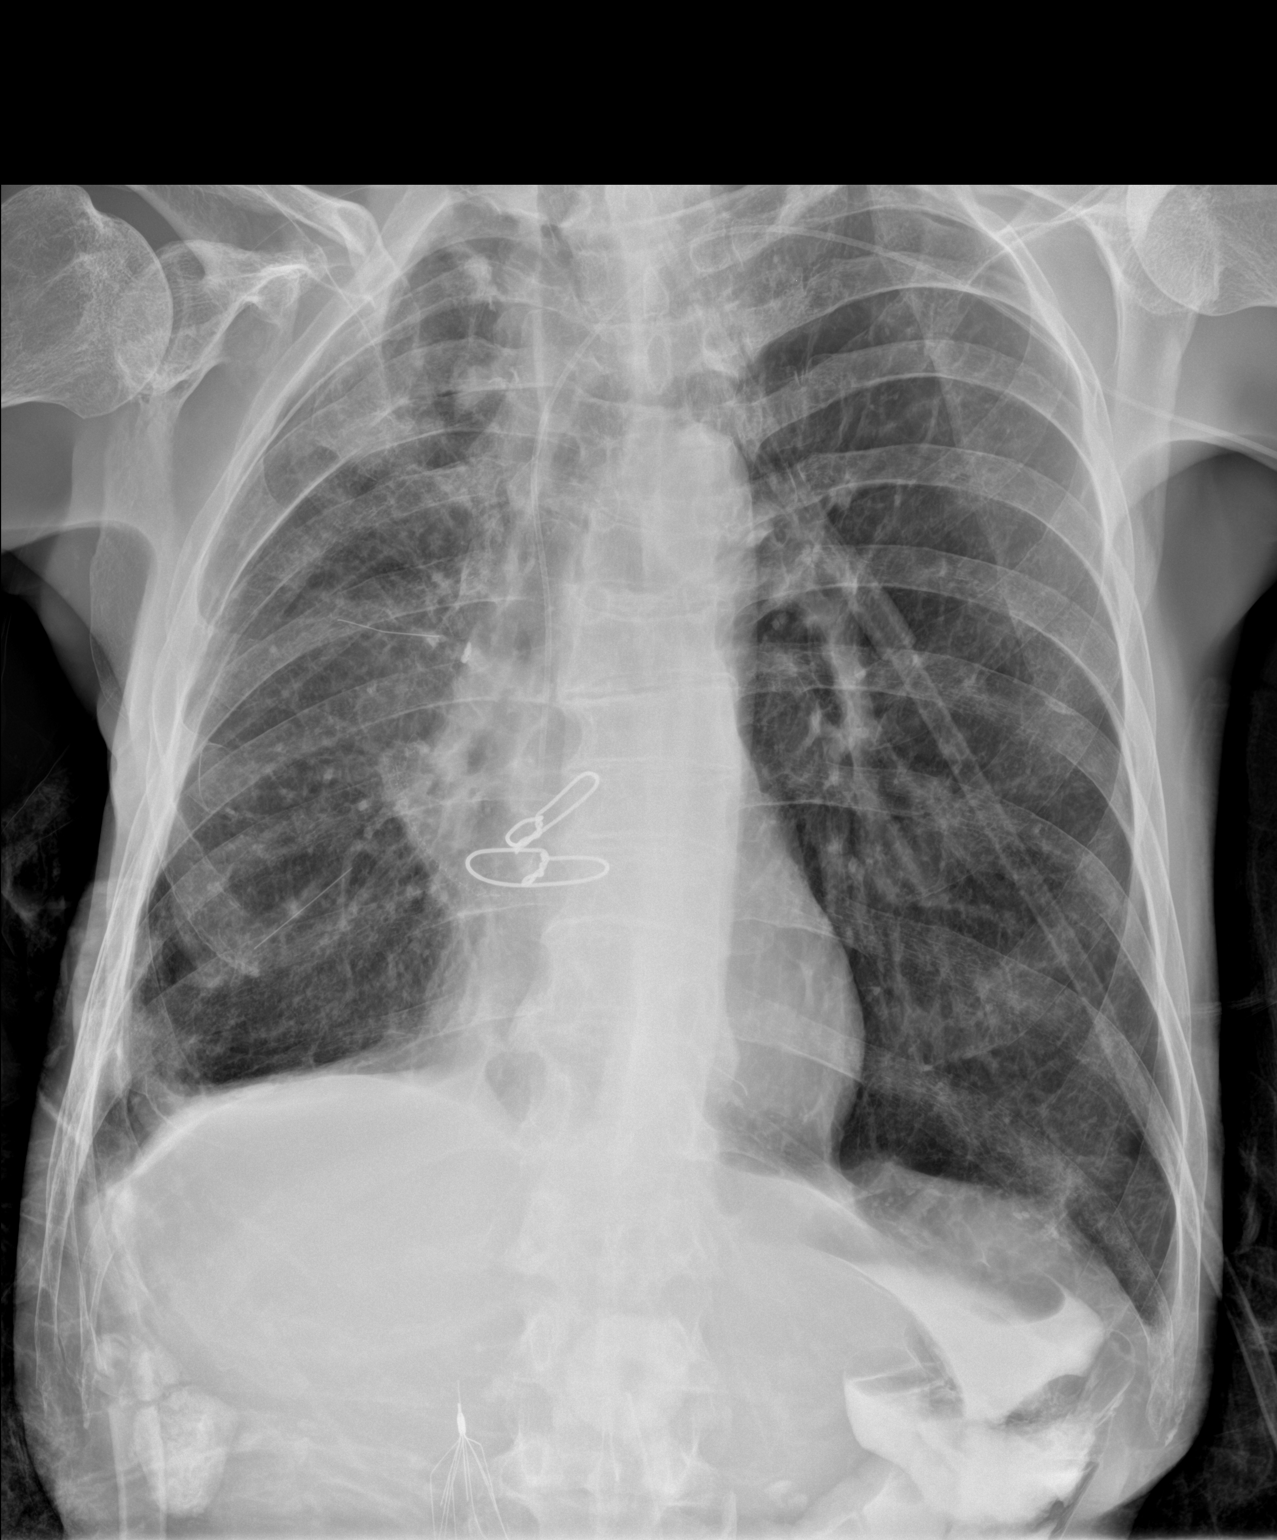

[1 of 1 positions shown; findings below may reference images not displayed]

FINDINGS: Single frontal view of the chest demonstrates left-sided PICC tip
overlying superior vena cava. The cardiac silhouette is
unremarkable. Chronic scarring and emphysema without airspace
disease, effusion, or pneumothorax. Linear metallic foreign bodies
overlying the right lung unchanged.

There is contrast within the upper abdomen consistent with prior
failed gastrostomy tube exchange.
IMPRESSION: 1. PICC line tip overlies superior vena cava.
2. Chronic parenchymal lung scarring.
3. Extravasated contrast within the upper abdomen related to failed
gastrostomy tube exchange.

## 2020-07-29 IMAGING — XA IR TUBE REMOVAL GASTROSTOMY
3 series · 3 of 3 positions shown · non-contrast
Comparison: Attempted conversion of gastrostomy to
gastrojejunostomy with subsequent gastrostomy tube
exchange-04/14/2020

INDICATION: History of esophageal atresia with chronic indwelling percutaneous
gastrostomy tube.

[Series 1: fluoroscopy - stored · 1 of 1 slices shown (1 of 3)]
[im 1/1]
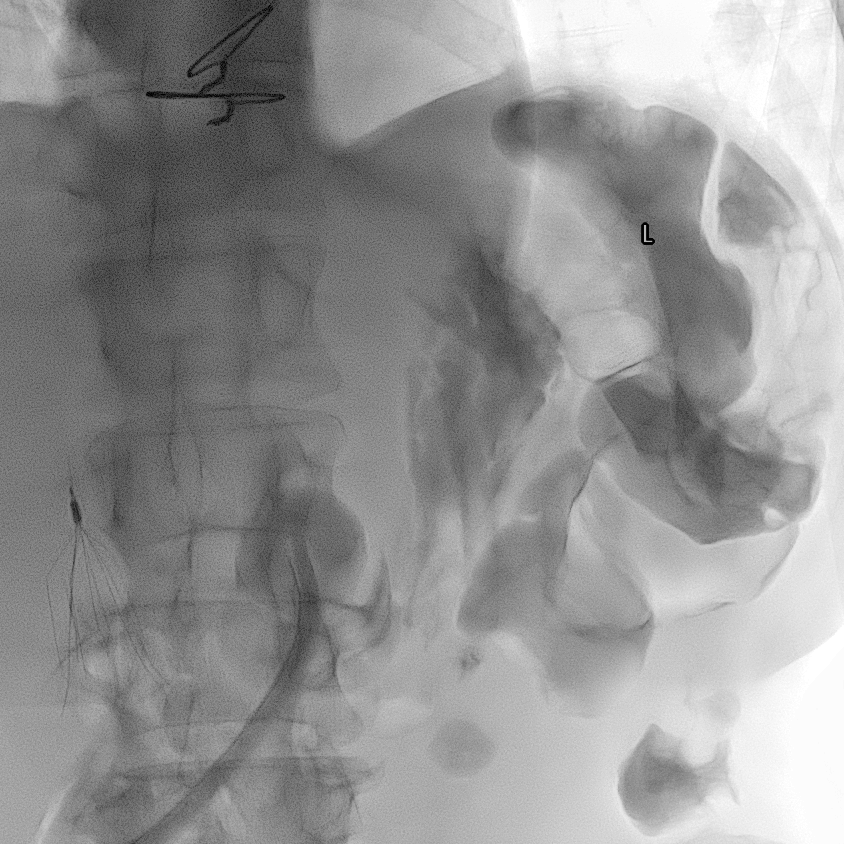

[Series 2: fluoroscopy - stored · 1 of 1 slices shown (2 of 3)]
[im 1/1]
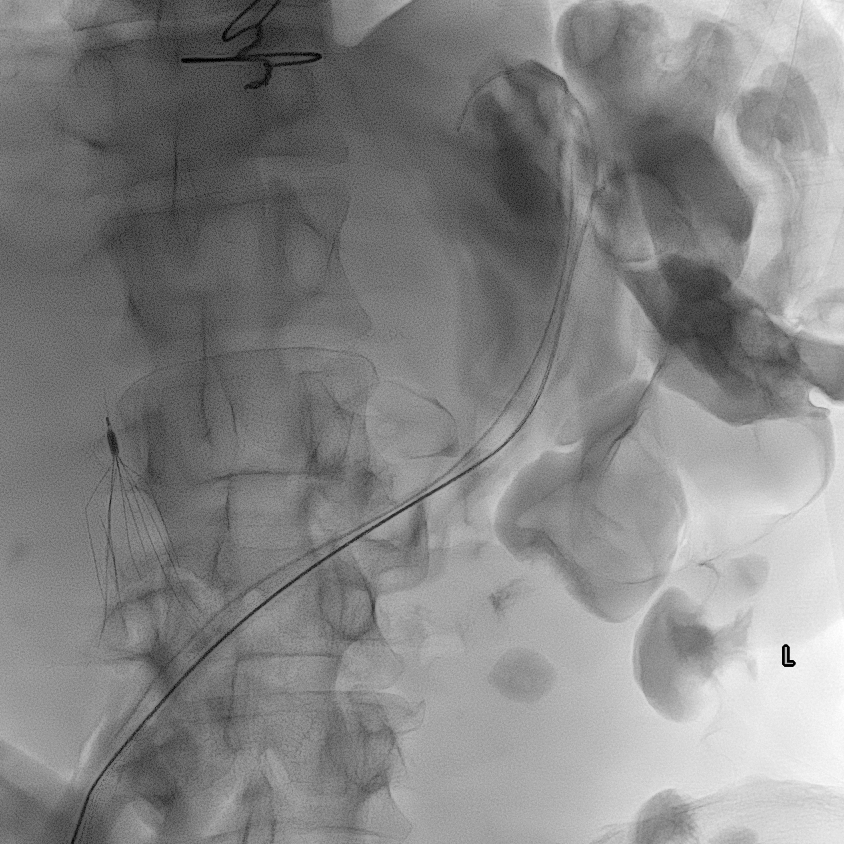

[Series 3: fluoroscopy - stored · 1 of 1 slices shown (3 of 3)]
[im 1/1]
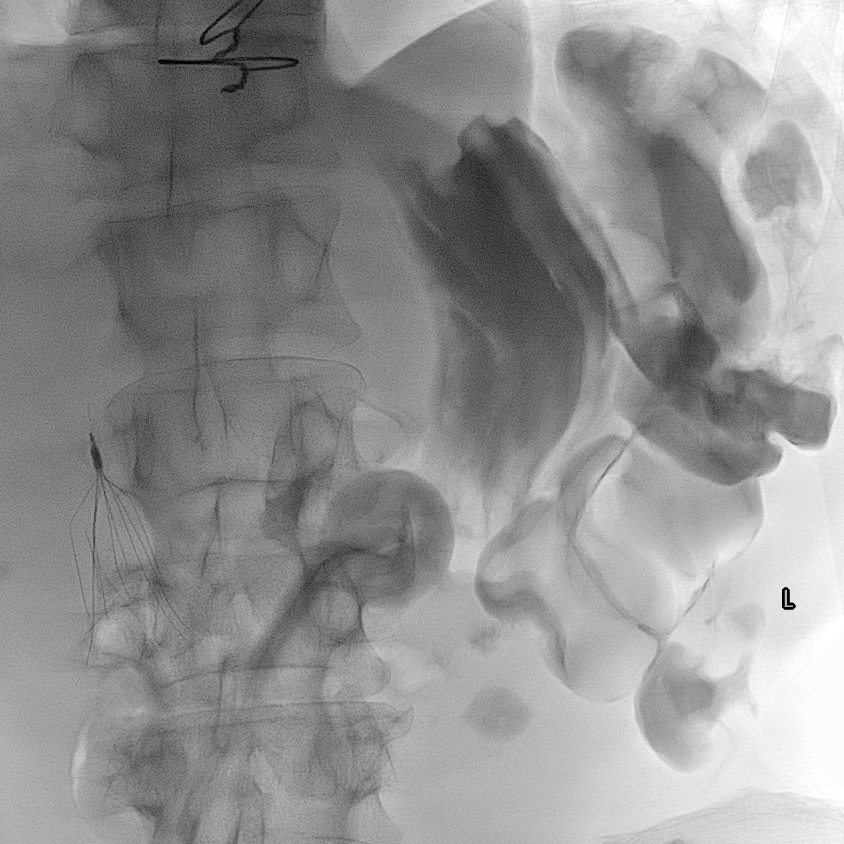

[3 of 3 positions shown; findings below may reference images not displayed]

Patient underwent attempted conversion of existing gastrostomy tube
to a gastrojejunostomy catheter however this was unable to perform
secondary to angulation of the gastrostomy access site. Note, the
gastrostomy tube was placed at an outside institution.

Unfortunately, the patient continues to experience persistent
leakage from his balloon retention gastrostomy tube and as such
presents today for fluoroscopic guided exchange and up sizing.

EXAM:
FLUOROSCOPIC GUIDED REPLACEMENT OF GASTROSTOMY TUBE
MEDICATIONS:
None.

CONTRAST:  10mL VISIPAQUE IODIXANOL 320 MG/ML IV SOLN - administered
into the gastric lumen

FLUOROSCOPY TIME:  1 minutes, 54 seconds (13.7 mGy)

COMPLICATIONS:
None immediate.

PROCEDURE:
Informed written consent was obtained from the patient after a
discussion of the risks, benefits and alternatives to treatment.
Questions regarding the procedure were encouraged and answered. A
timeout was performed prior to the initiation of the procedure.

The upper abdomen and external portion of the existing gastrostomy
tube was prepped and draped in the usual sterile fashion, and a
sterile drape was applied covering the operative field. Maximum
barrier sterile technique with sterile gowns and gloves were used
for the procedure. A timeout was performed prior to the initiation
of the procedure.

The existing gastrostomy tube was injected with a small amount of
contrast confirming appropriate positioning within the gastric
lumen. The existing gastrostomy tube was cannulated with a short
Amplatz wire. Under fluoroscopic guidance, the existing gastrostomy
tube was exchanged for a Kumpe catheter which was utilized to
manipulate the Amplatz wire to the level of the gastric fundus.
Next, under intermittent fluoroscopic guidance, a new, slightly
larger now 22 French gastrostomy tube was inserted via the chronic
gastrostomy tube track. The balloon was insufflated with
approximately 10 cc of saline and dilute contrast and pulled against
the anterior inter wall the gastric lumen. The external disc was
cinched.

Contrast was injected as several spot fluoroscopic images were
obtained in various obliquities. A dressing was applied. The patient
tolerated the procedure well without immediate postprocedural
complication.
IMPRESSION: Successful fluoroscopic guided replacement of a new, slightly larger
now 22-French gastrostomy tube. The new gastrostomy tube is ready
for immediate use.

## 2020-08-09 IMAGING — US US RENAL
1 series · 14 of 25 positions shown · non-contrast
Comparison: None.

CLINICAL DATA: Follow-up right ureteral stone

EXAM:
RENAL / URINARY TRACT ULTRASOUND COMPLETE

[Series 1: us renal · 0.22mm/px · 14 of 31 slices shown]
[im 1/31]
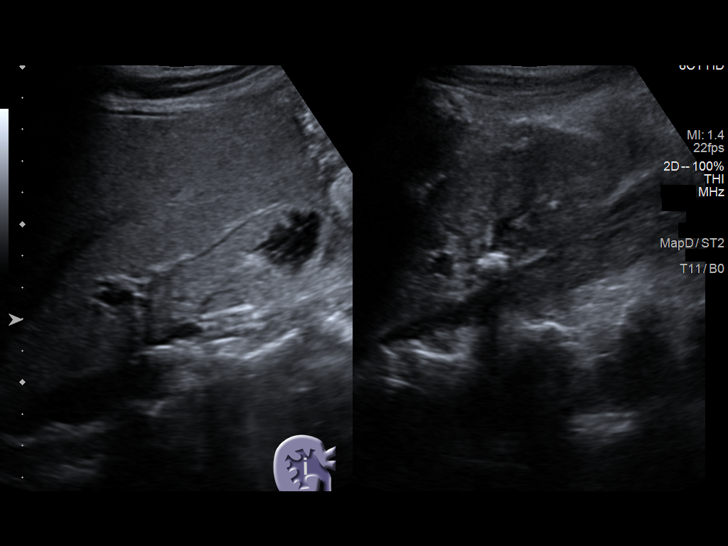
[im 3/31]
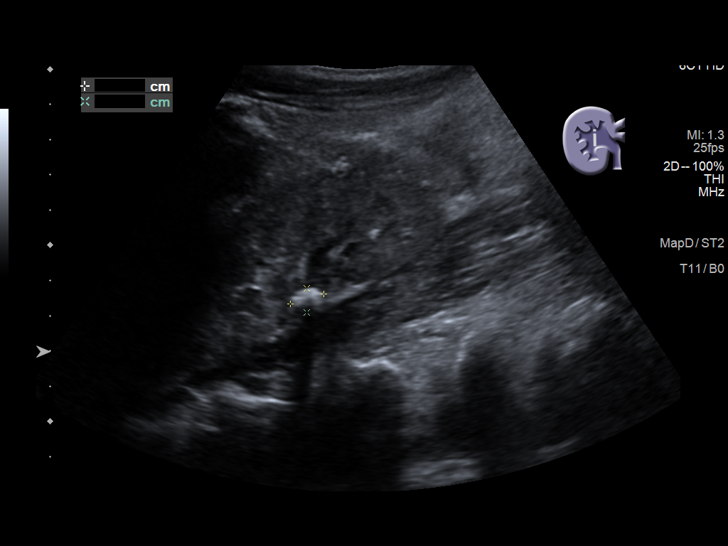
[im 6/31]
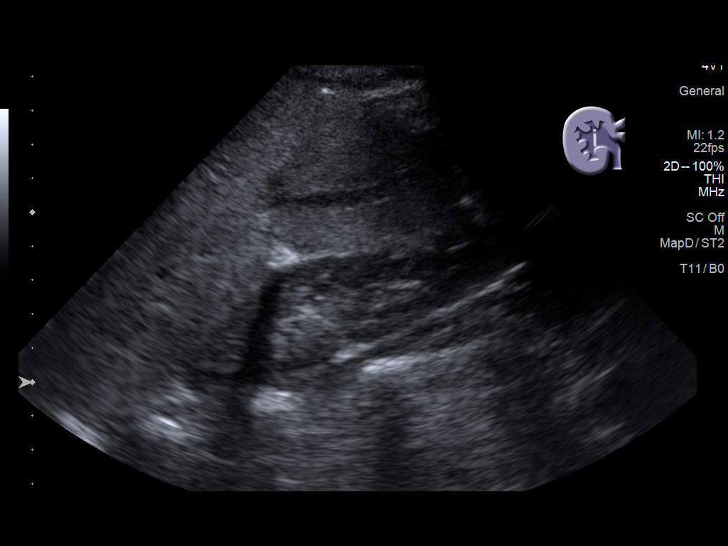
[im 8/31]
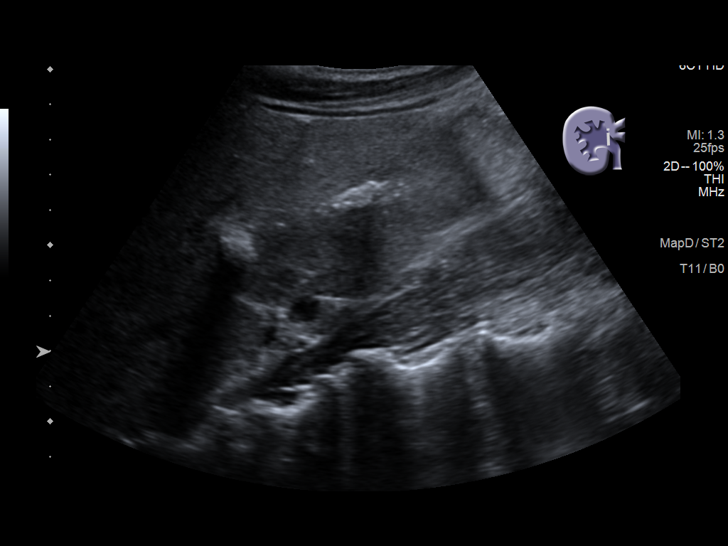
[im 11/31]
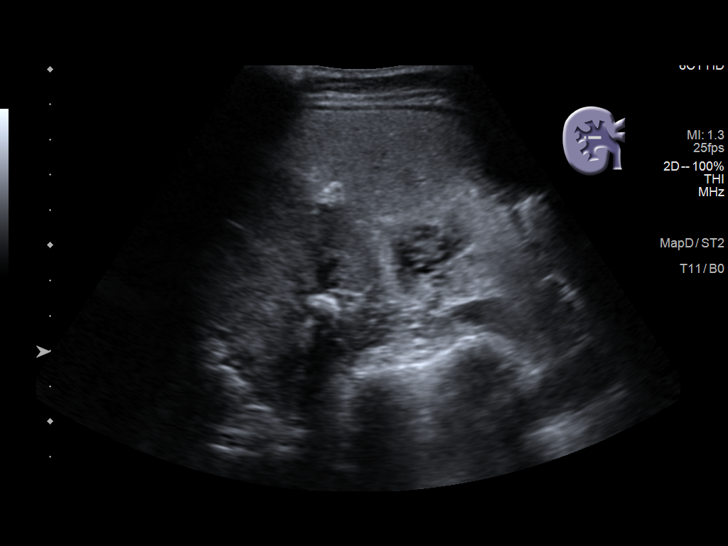
[im 12/31]
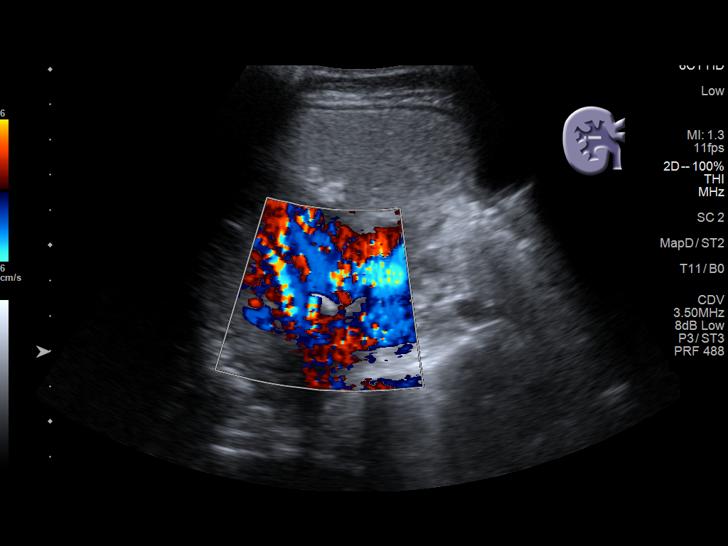
[im 14/31]
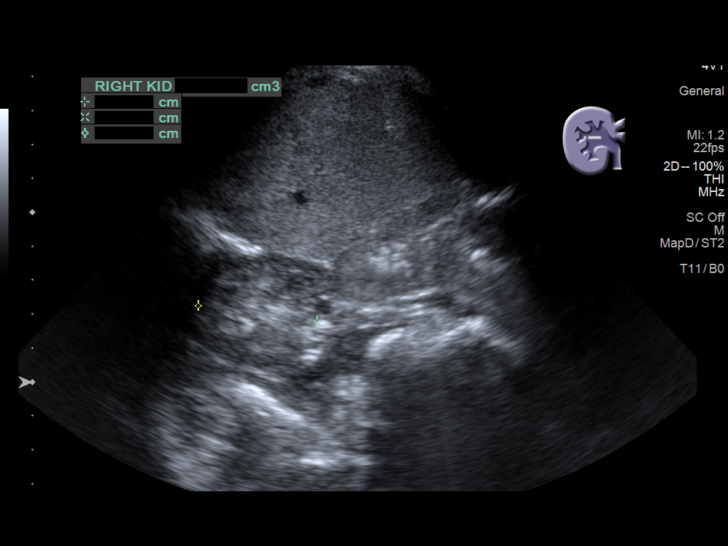
[im 17/31]
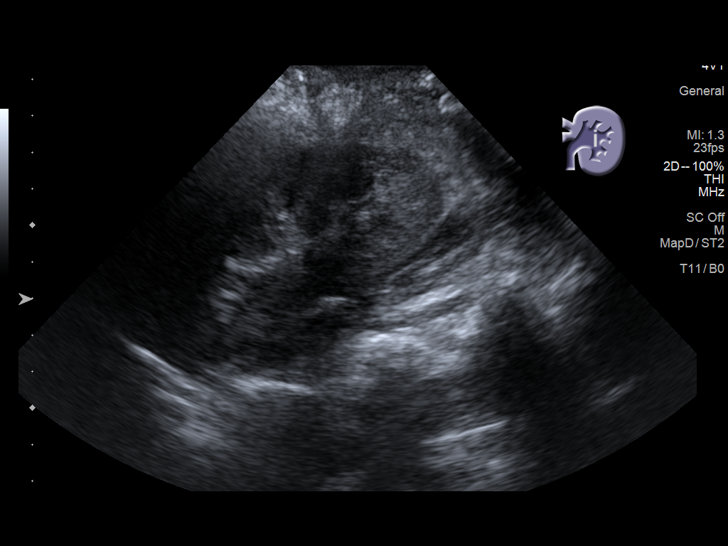
[im 19/31]
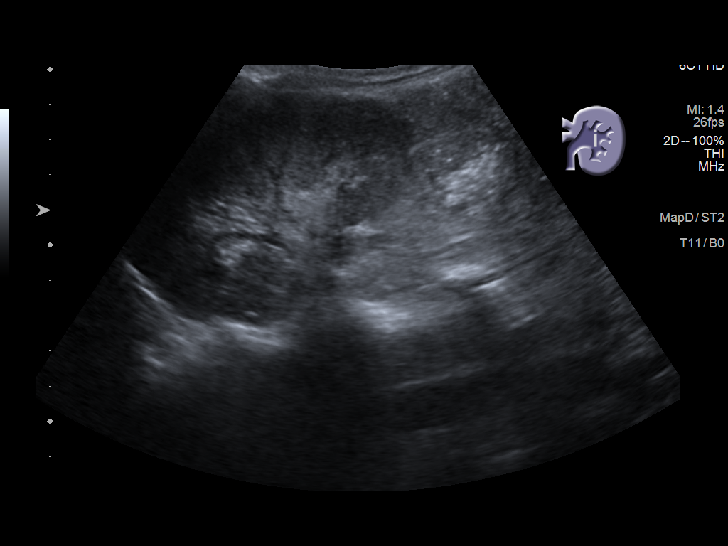
[im 21/31]
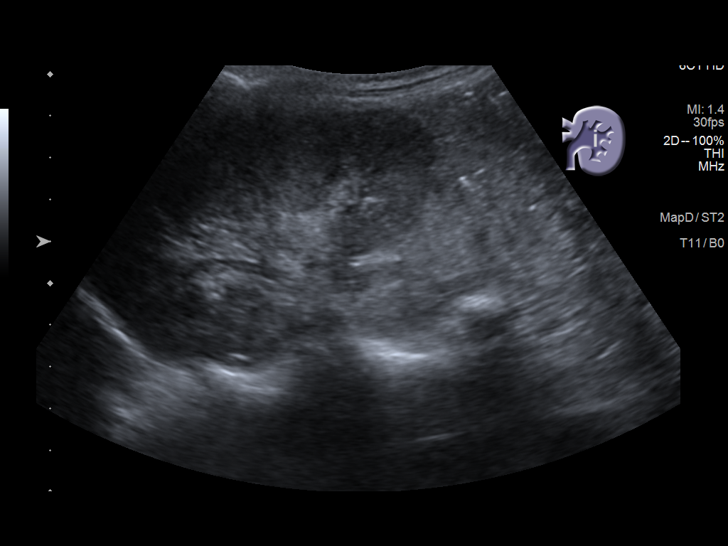
[im 23/31]
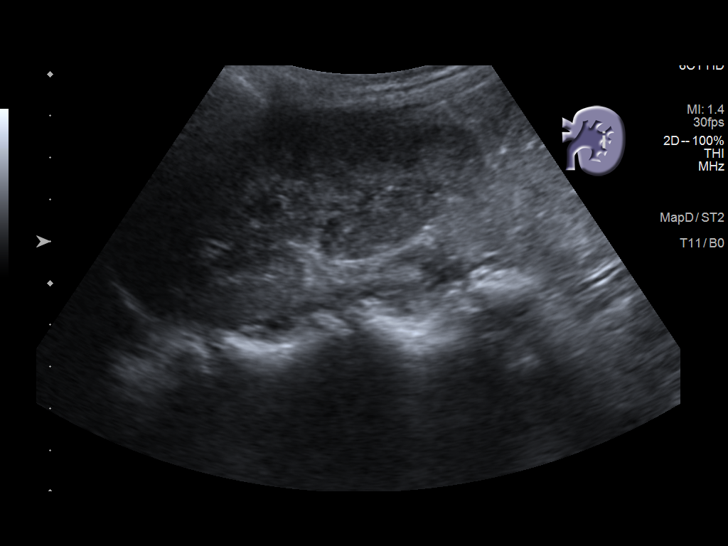
[im 26/31]
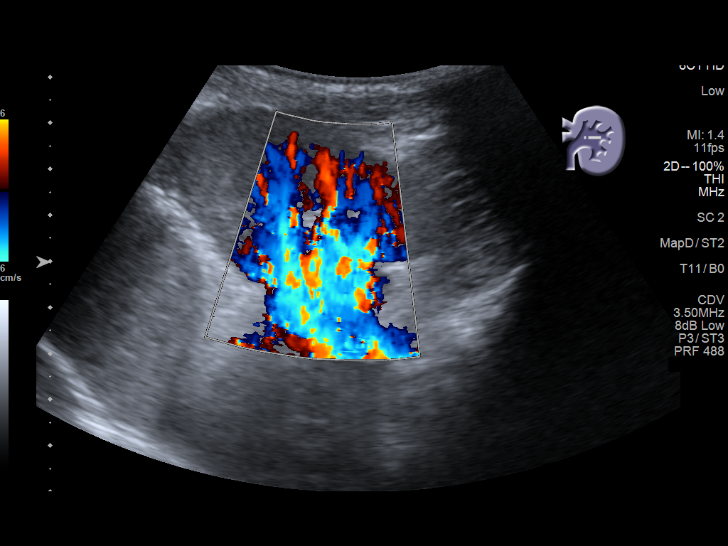
[im 28/31]
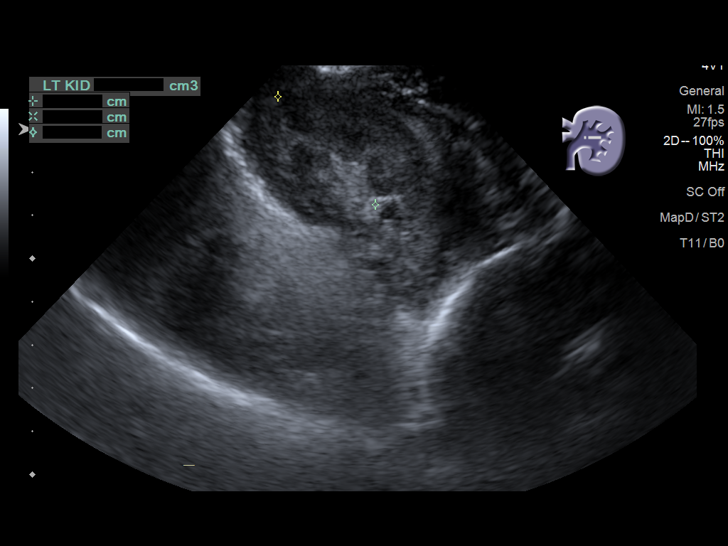
[im 31/31]
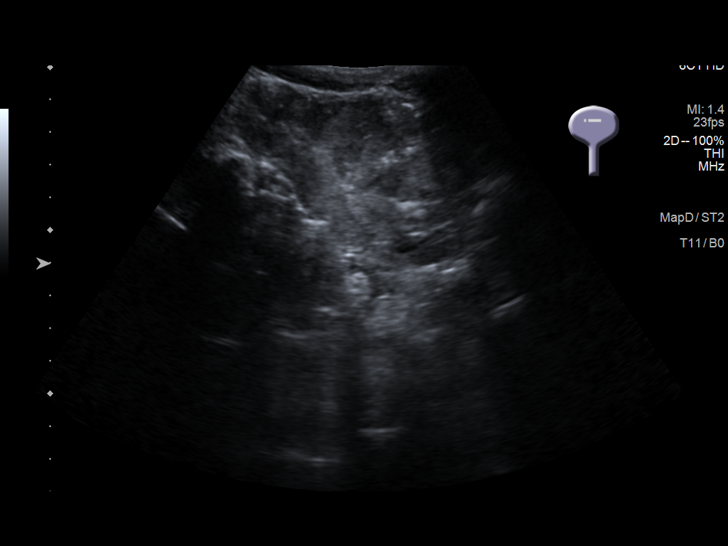

[14 of 25 positions shown; findings below may reference images not displayed]

FINDINGS: Right Kidney:

Renal measurements: 8.6 x 3.0 x 3.5 cm. = volume: 48 mL. Mild
increased echogenicity is noted. No hydronephrosis is seen. There is
a echogenic focus consistent with nonobstructing stone in the
midportion of the right kidney.

Left Kidney:

Renal measurements: 8.5 x 3.8 x 3.4 cm. = volume: 56 mL.
Echogenicity within normal limits. No mass or hydronephrosis
visualized.

Bladder:

Decompressed by Foley catheter.

Other:

None.
IMPRESSION: Stone in the midportion of the right kidney without obstructive
change. No other focal abnormality is noted.

## 2020-08-13 IMAGING — DX DG CHEST 1V PORT
2 series · 2 of 2 positions shown · non-contrast
Comparison: 04/30/2020

CLINICAL DATA: Shortness of breath

EXAM:
PORTABLE CHEST 1 VIEW

[chest ap (1 of 2)]
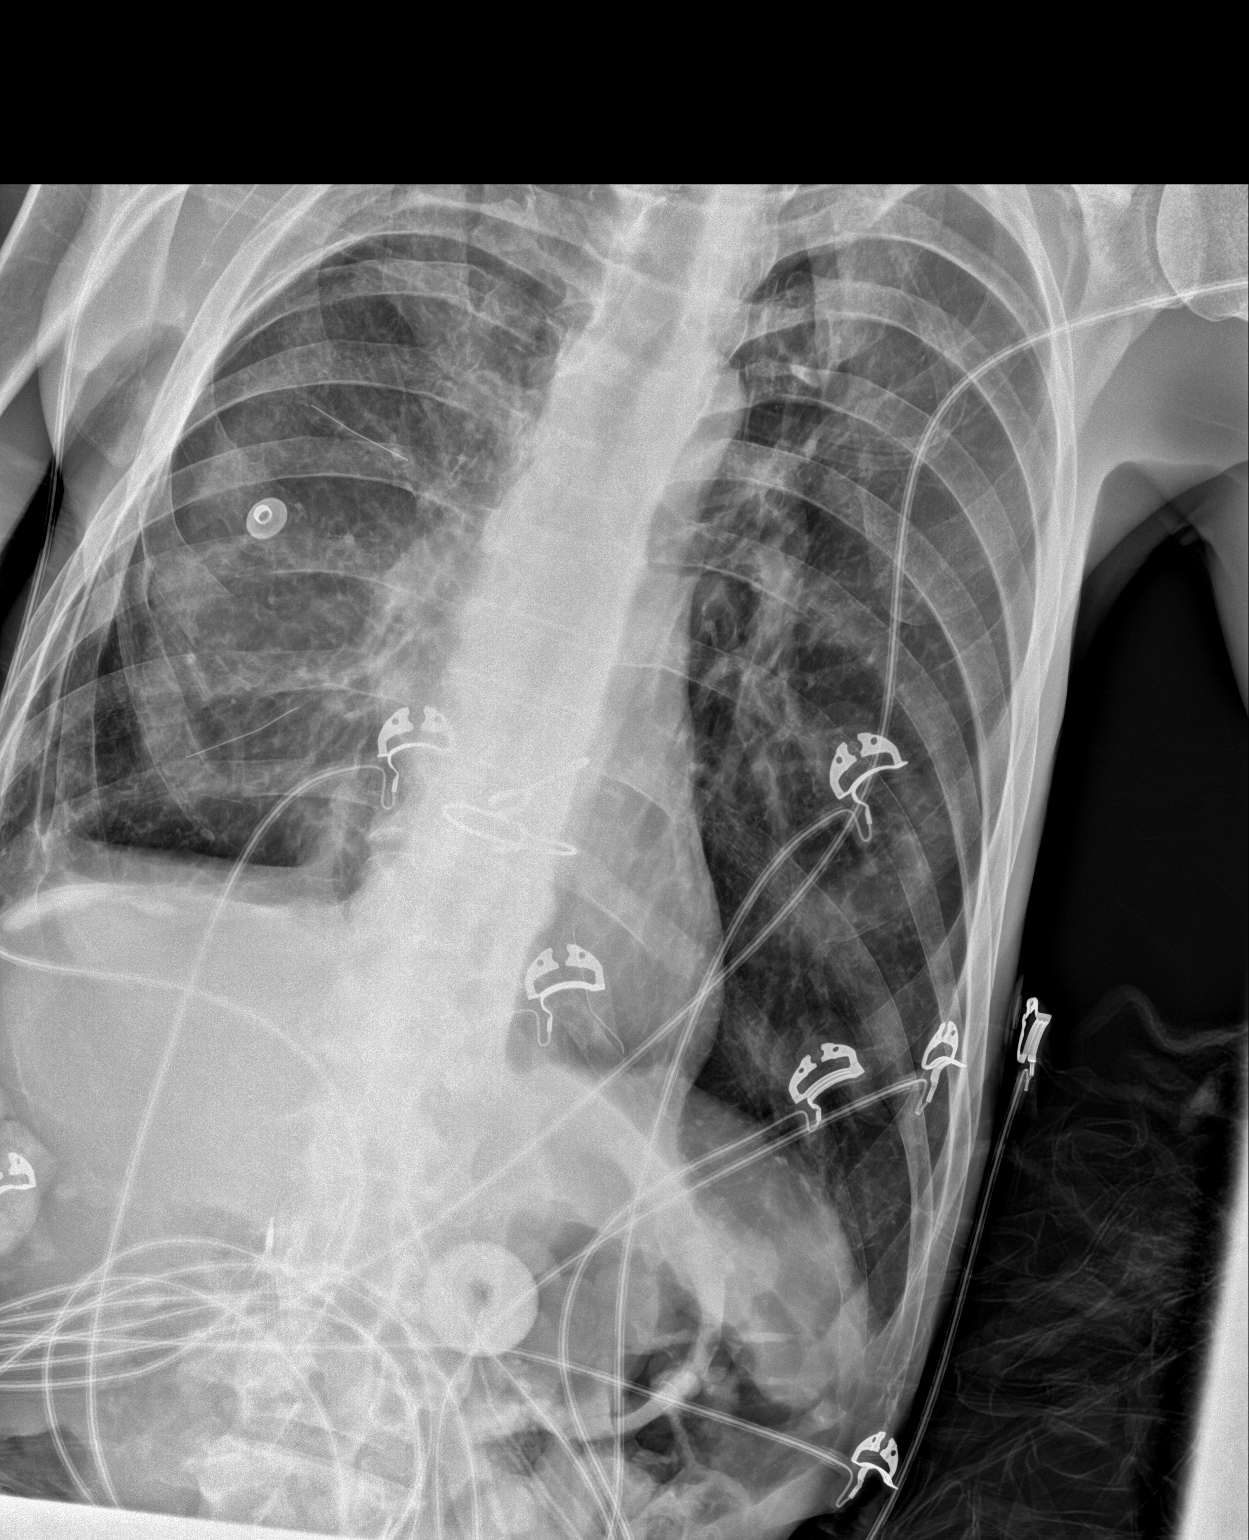

[chest ap (2 of 2)]
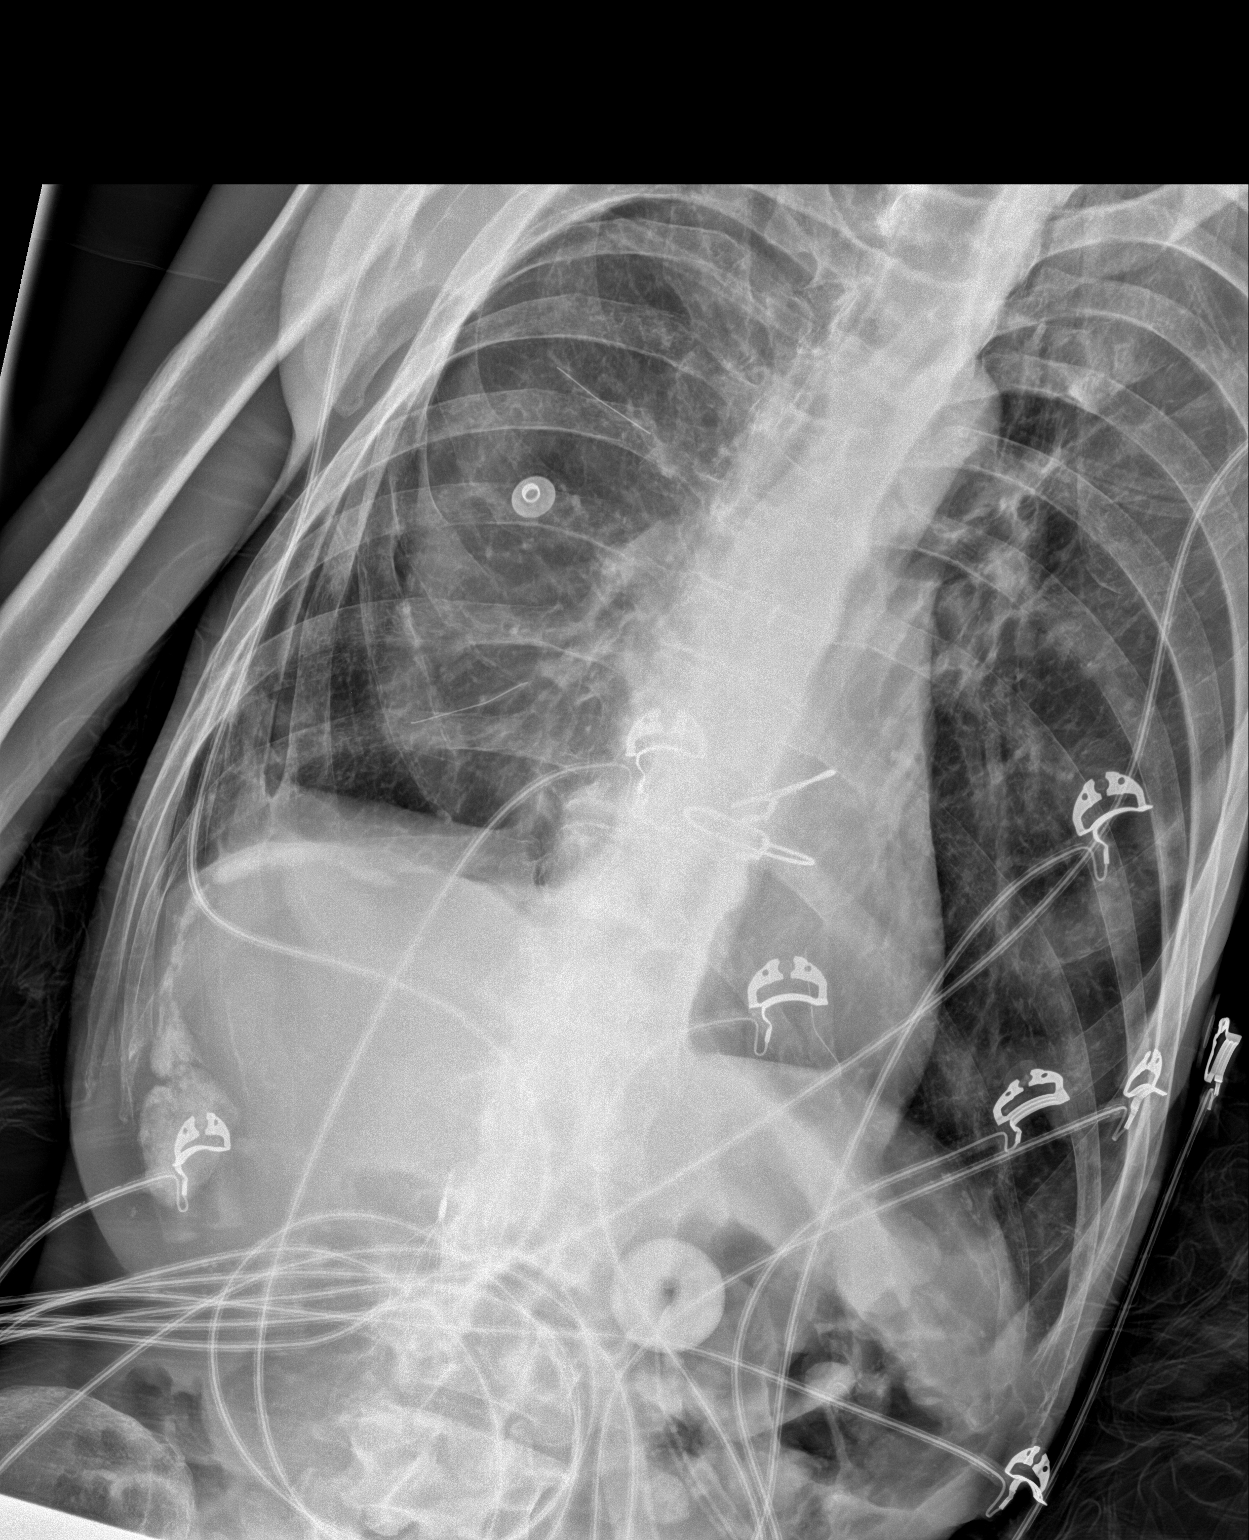

[2 of 2 positions shown; findings below may reference images not displayed]

FINDINGS: There is hyperinflation of the lungs compatible with COPD. Calcified
right basilar pleural plaques. No confluent airspace opacities or
effusions. Heart is normal size.
IMPRESSION: COPD.  Right basilar calcified pleural plaques.  No acute findings.

## 2020-08-14 ENCOUNTER — Emergency Department
Admission: EM | Admit: 2020-08-14 | Discharge: 2020-08-14 | Disposition: A | Discharge status: Skilled Nursing Facility | Payer: Medicaid Other | Attending: Emergency Medicine | Admitting: Emergency Medicine

## 2020-08-14 ENCOUNTER — Other Ambulatory Visit: Payer: Self-pay

## 2020-08-14 ENCOUNTER — Encounter: Payer: Self-pay | Admitting: Intensive Care

## 2020-08-14 DIAGNOSIS — M545 Low back pain: Secondary | ICD-10-CM | POA: Diagnosis not present

## 2020-08-14 DIAGNOSIS — G8918 Other acute postprocedural pain: Secondary | ICD-10-CM | POA: Insufficient documentation

## 2020-08-14 DIAGNOSIS — G894 Chronic pain syndrome: Secondary | ICD-10-CM

## 2020-08-14 DIAGNOSIS — K9423 Gastrostomy malfunction: Secondary | ICD-10-CM | POA: Diagnosis present

## 2020-08-14 DIAGNOSIS — Z87891 Personal history of nicotine dependence: Secondary | ICD-10-CM | POA: Diagnosis not present

## 2020-08-14 DIAGNOSIS — L089 Local infection of the skin and subcutaneous tissue, unspecified: Secondary | ICD-10-CM | POA: Diagnosis not present

## 2020-08-14 DIAGNOSIS — R238 Other skin changes: Secondary | ICD-10-CM

## 2020-08-14 MED ORDER — OXYCODONE-ACETAMINOPHEN 7.5-325 MG PO TABS
1.0000 | ORAL_TABLET | Freq: Once | ORAL | Status: AC
  Administered 2020-08-14: 1 via ORAL
  Filled 2020-08-14: qty 1

## 2020-08-14 MED ORDER — NYSTATIN 100000 UNIT/GM EX POWD
Freq: Once | CUTANEOUS | Status: AC
  Filled 2020-08-14: qty 15

## 2020-08-14 NOTE — ED Notes (Signed)
Ems arrived to transport pt back to facility

## 2020-08-14 NOTE — ED Notes (Signed)
Pt c/o peg tube leaking. Also c/o chronic back pain and asking for pain medication for back. Pt informed that provider will have to place any orders prior to receiving medications. Pt esophageal bag drained. Pt informed RN he had soiled brief. Pt brief changed and placed into stretcher at this time.

## 2020-08-14 NOTE — ED Triage Notes (Signed)
First Nurse Note:  Arrives from [facility] for evaluation of G-Tube.  Per EMS g-tube is cracked.

## 2020-08-14 NOTE — Discharge Instructions (Signed)
We replaced her PEG tube with a similar 22 French PEG tube.   Please keep the skin clean around the site.  We applied nystatin powder to help treat skin irritation and superficial fungal infection.  Return to the ED with any further concerns
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
## 2020-08-14 NOTE — ED Notes (Signed)
Called [facility] for transport to [place]

## 2020-08-14 NOTE — ED Notes (Signed)
Per Vladimir Crofts MD, patient can be seen in flex

## 2020-08-14 NOTE — ED Triage Notes (Signed)
Patient arrived by EMS from [facility] for crack in G-tube. Patient reports the crack at the top causes his medications and feeding to leak out mostly.

## 2020-08-15 IMAGING — US US RENAL
2 series · 14 of 21 positions shown · non-contrast
Comparison: Renal ultrasound 6 days ago 05/14/2020.

CLINICAL DATA: Urinary tract infection.

EXAM:
RENAL / URINARY TRACT ULTRASOUND COMPLETE

[Series 1: us renal · 13 of 20 slices shown]
[im 1/20]
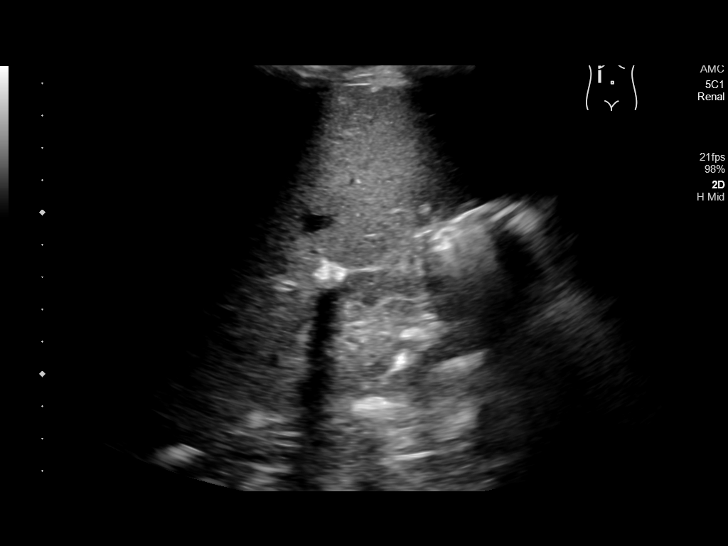
[im 3/20]
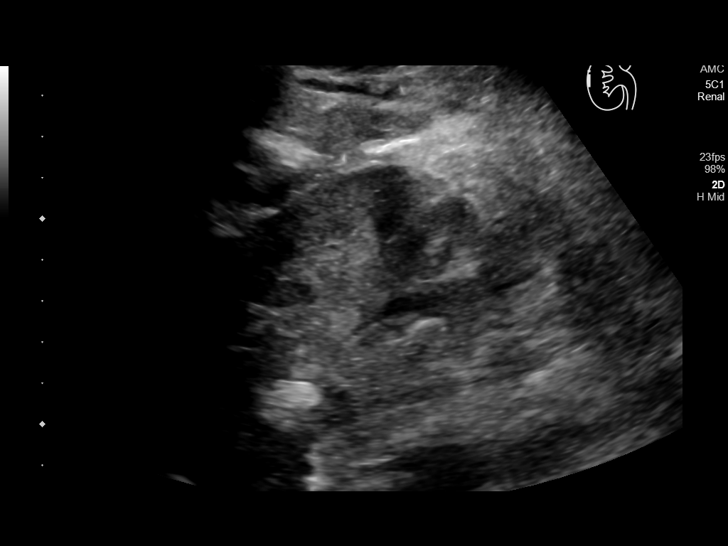
[im 4/20]
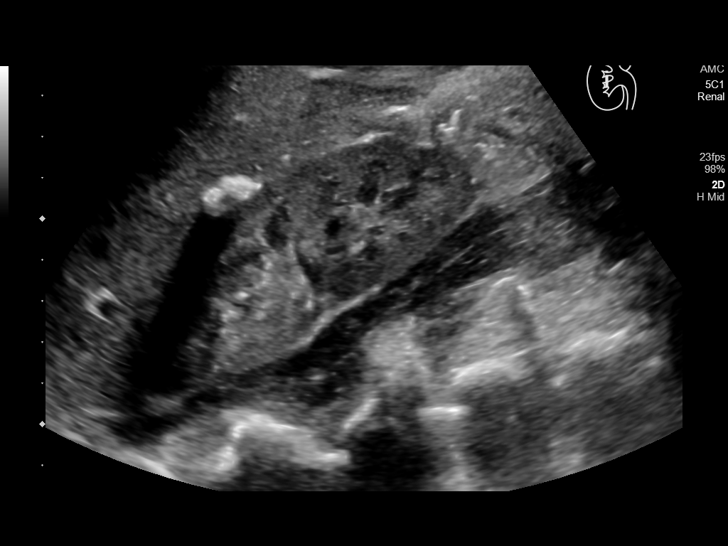
[im 6/20]
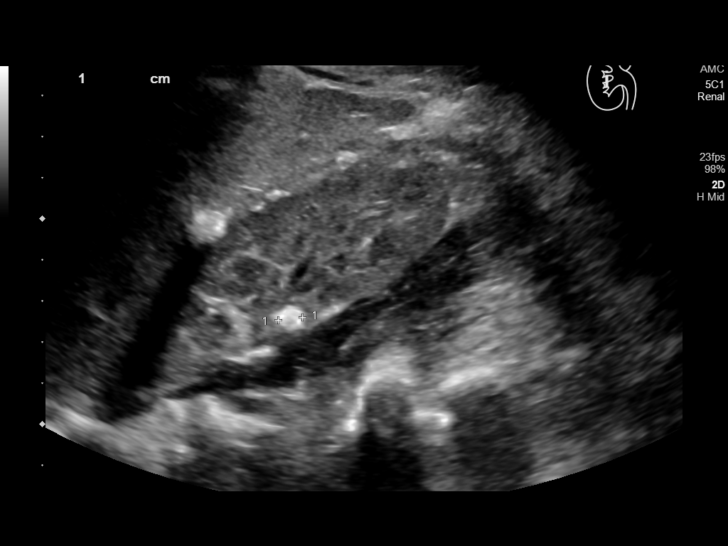
[im 7/20]
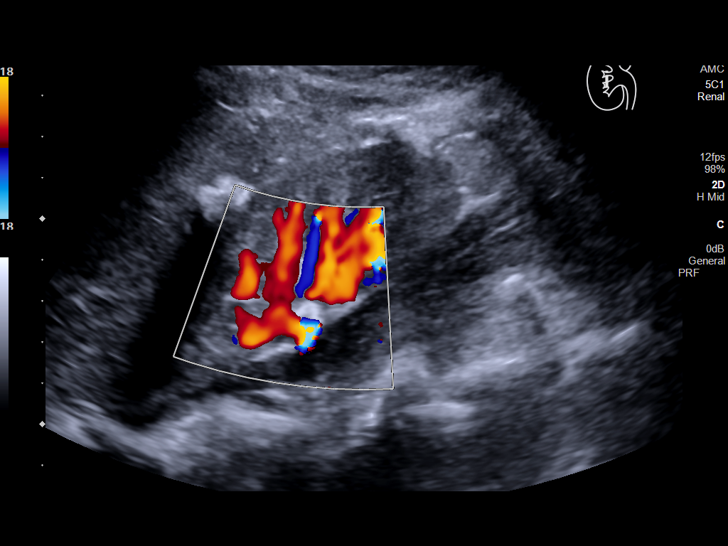
[im 9/20]
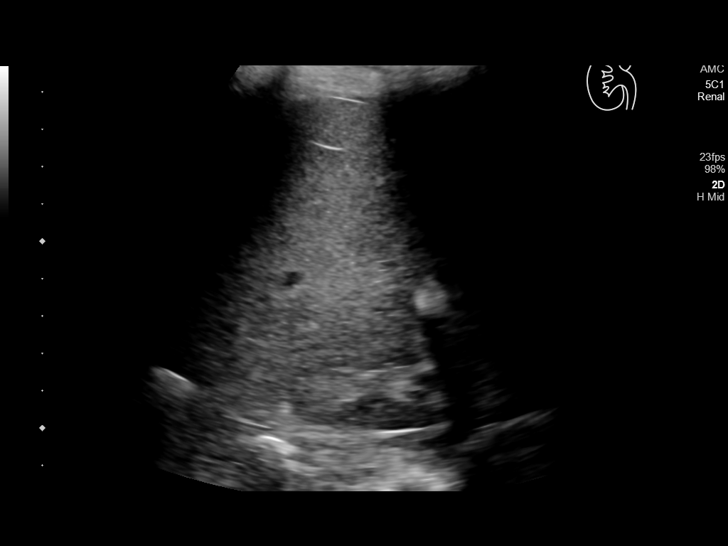
[im 10/20]
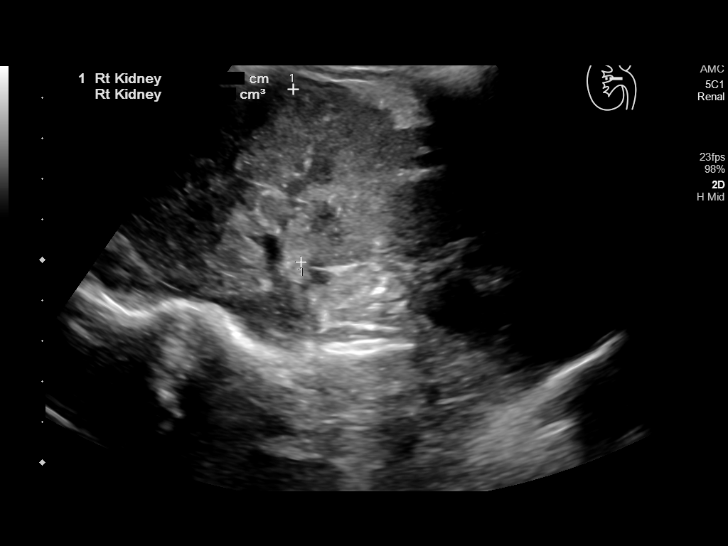
[im 12/20]
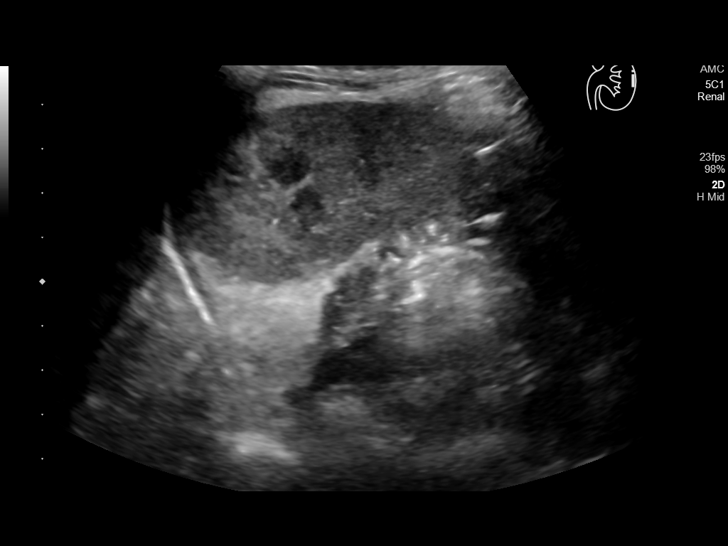
[im 13/20]
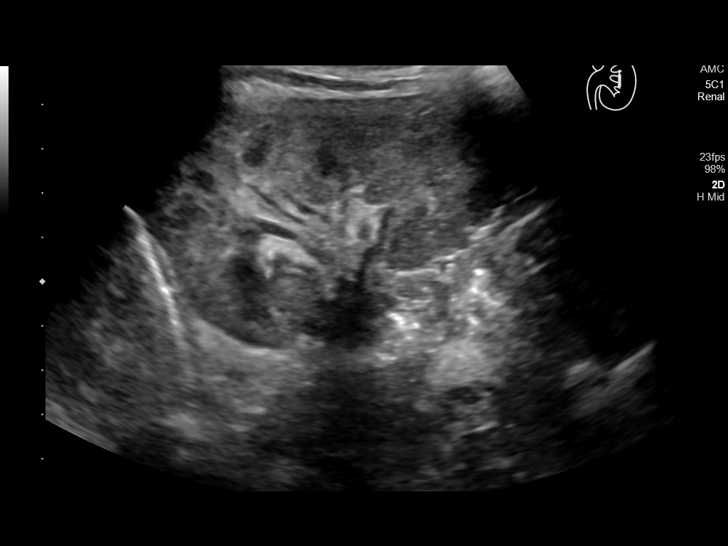
[im 15/20]
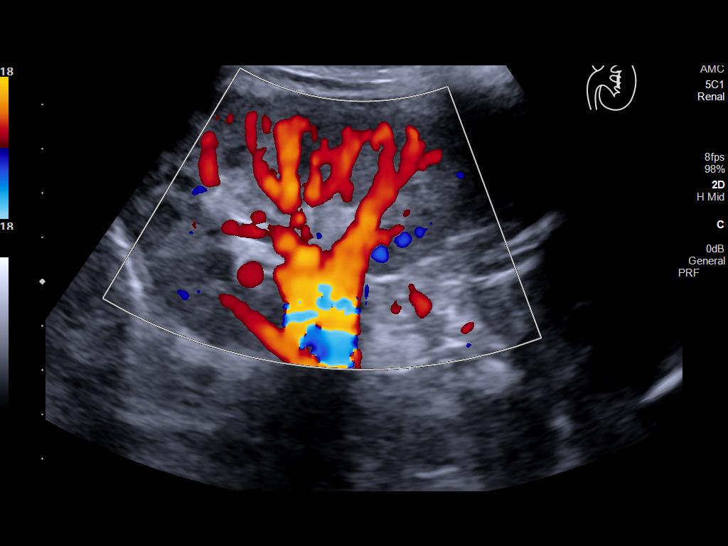
[im 16/20]
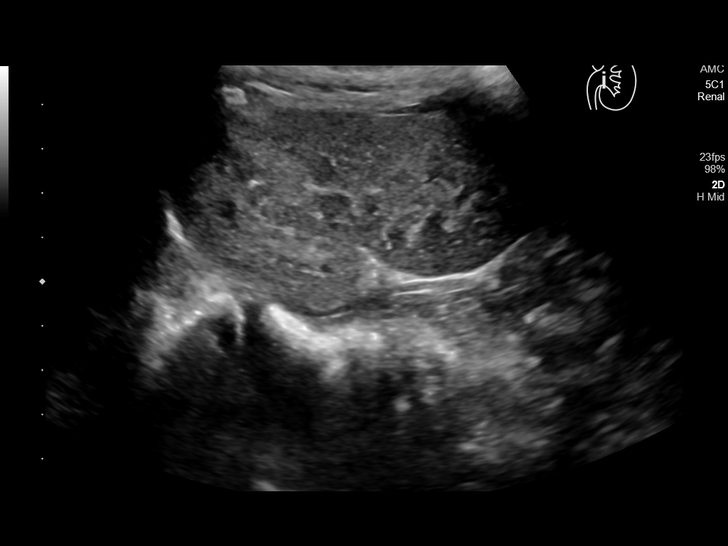
[im 18/20]
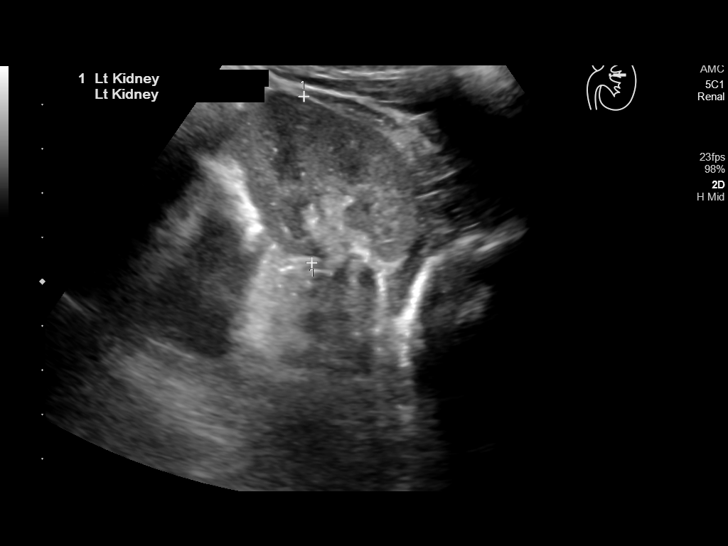
[im 19/20]
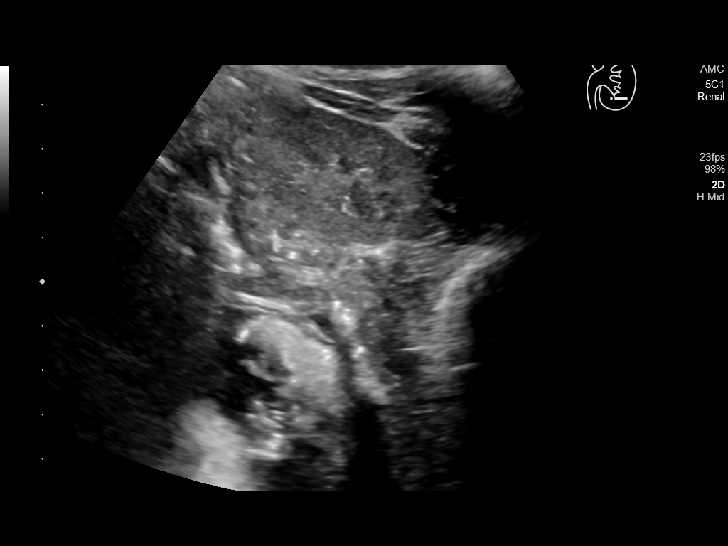

[Series 1001: renal · 1 of 1 slices shown]
[im 1/1]
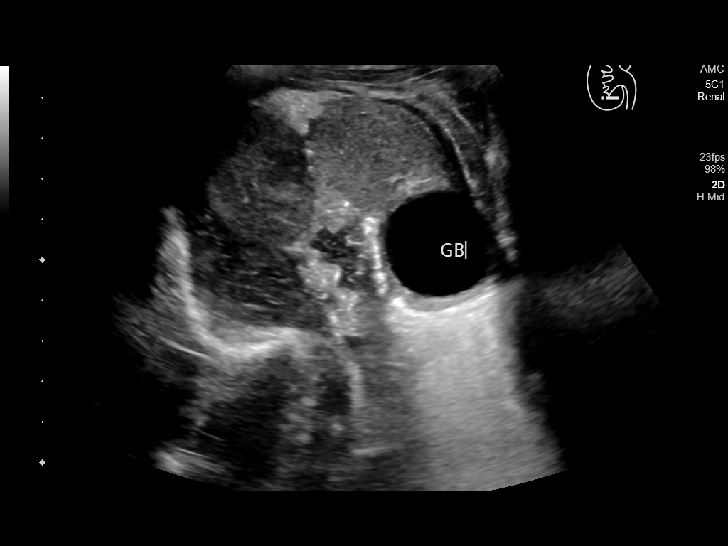

[14 of 21 positions shown; findings below may reference images not displayed]

FINDINGS: Right Kidney:

Renal measurements: 9.0 x 3.6 x 4.3 cm = volume: 72 mL. Increased
renal echogenicity with thinning of the renal parenchyma. 6 mm
nonobstructing stone in the mid kidney. No hydronephrosis. No
perirenal fluid collection.

Left Kidney:

Renal measurements: 8.3 x 4.5 x 3.8 cm = volume: 73 mL. Borderline
increased renal echogenicity. No hydronephrosis. No evidence of
calculus. No perirenal fluid collection.

Bladder:

Not well evaluated due to overlying artifact.

Other:

Technically limited evaluation.
IMPRESSION: 1. Stable nonobstructing right renal calculus without hydronephrosis
or renal fluid collection.
2. Increased renal echogenicity consistent with chronic medical
renal disease.

## 2020-08-15 IMAGING — DX DG CHEST 1V PORT
1 series · 1 of 1 positions shown · non-contrast
Comparison: Most recent radiograph 2 days ago 05/18/2020. Most
recent CT 02/05/2020

CLINICAL DATA: Weakness.  Hypotension and shortness of breath.

EXAM:
PORTABLE CHEST 1 VIEW

[chest ap]
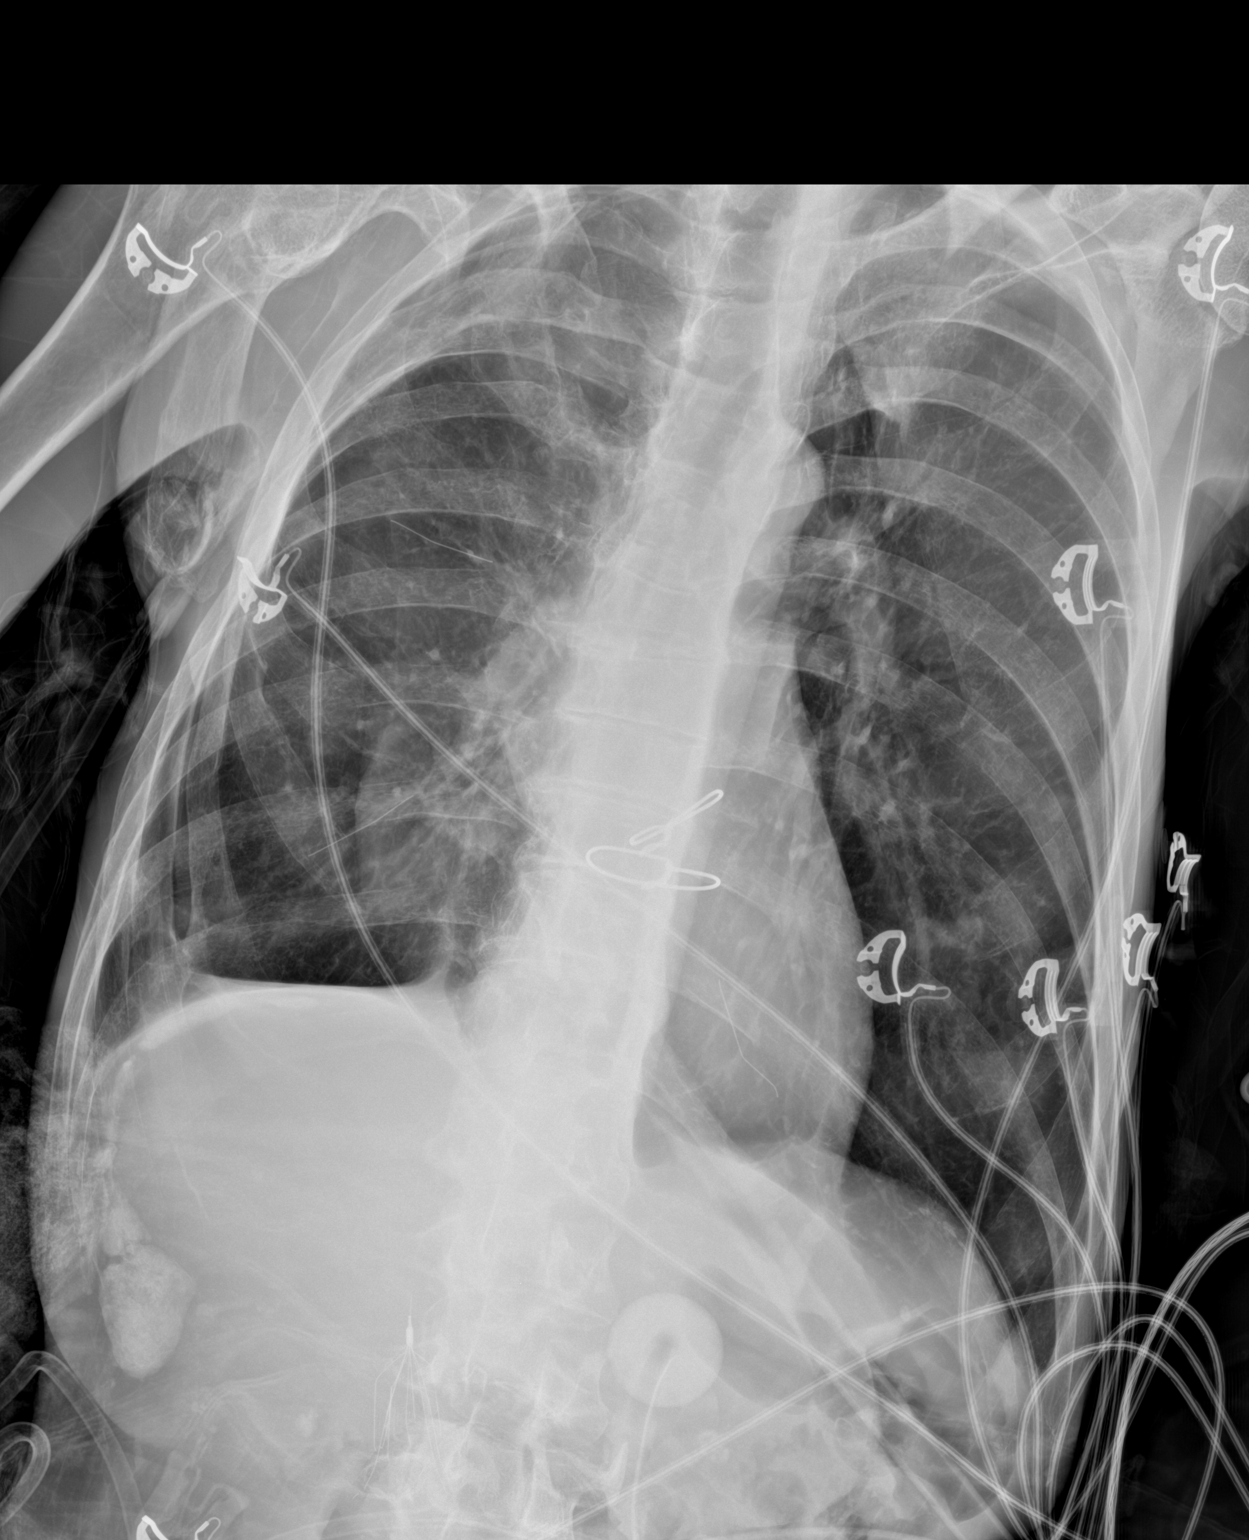

[1 of 1 positions shown; findings below may reference images not displayed]

FINDINGS: Chronic volume loss in the right hemithorax with calcified plaque at
the right lung base. Chronic hyperinflation. No acute airspace
disease. Heart is normal in size with normal mediastinal contours.
Two lower mediastinal wires intact. There are 3 linear metallic
densities, 1 projecting over the left heart, and 2 within the right
hemithorax, on prior CT within the right ventricle and pulmonary
parenchymal respectively. No pneumothorax, large pleural effusion,
or pulmonary edema. Gastrostomy tube and IVC filter in the upper
abdomen.
IMPRESSION: 1. Chronic volume loss in the right hemithorax with calcified
plaque. Chronic hyperinflation.
2. No acute radiographic findings.
3. Chronic and unchanged linear metallic densities within the right
hemithorax and projecting over the heart, within the right ventricle
and pulmonary parenchyma on prior chest CT.

## 2020-09-18 IMAGING — CT CT ABD-PELV W/O CM
2 of 4 series · 15 of 46 positions shown, 17 images · non-contrast
Comparison: CT abdomen pelvis dated 03/12/2020.

CLINICAL DATA: 49-year-old male with Klaes Otte material draining
from around the PEG tube. Concern for abscess.

EXAM:
CT ABDOMEN AND PELVIS WITHOUT CONTRAST
TECHNIQUE: Multidetector CT imaging of the abdomen and pelvis was performed
following the standard protocol without IV contrast.

[Series 2: routine abd/pel wo · axial · 0.62mm/px · z∈[-1191,-821]mm · 12 of 82 slices shown, 14 images]
[im 4/82  soft-tissue]
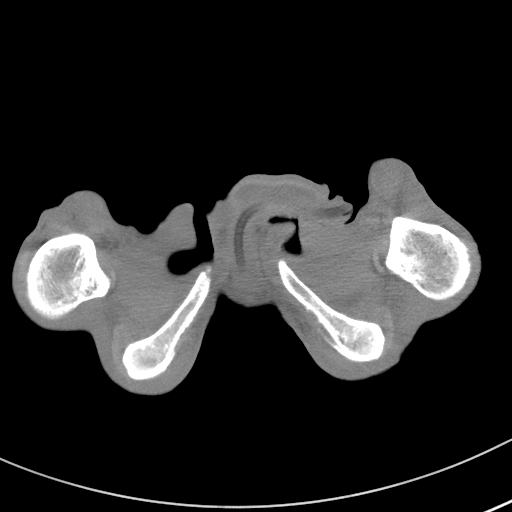
[im 4/82  bone]
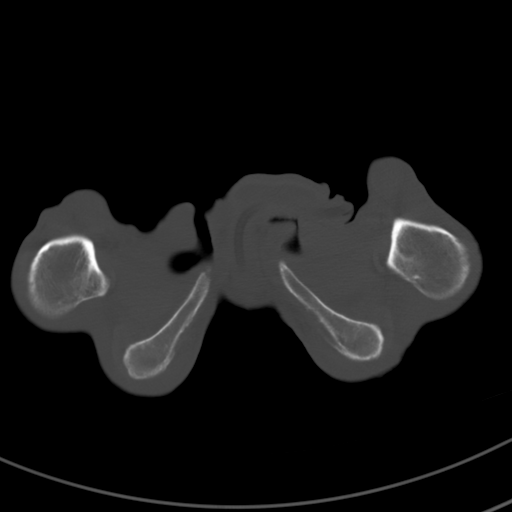
[im 12/82  soft-tissue]
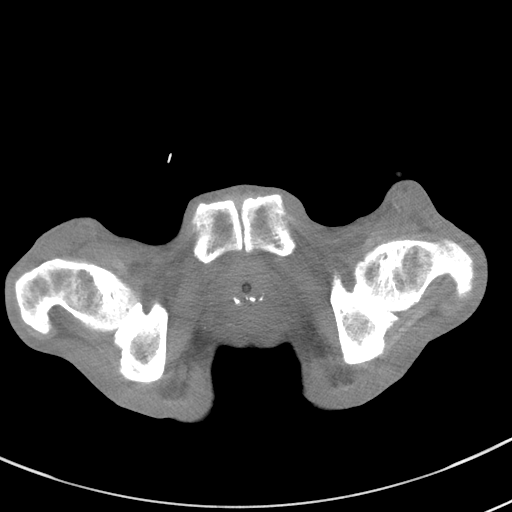
[im 20/82  soft-tissue]
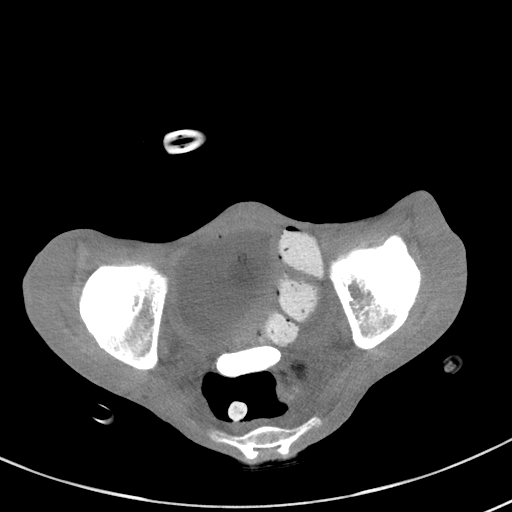
[im 24/82  soft-tissue]
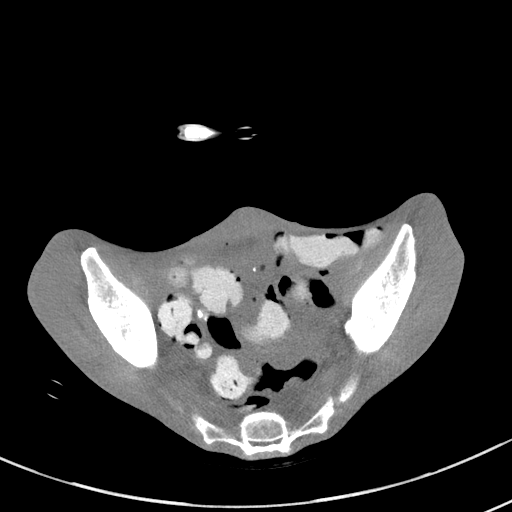
[im 31/82  soft-tissue]
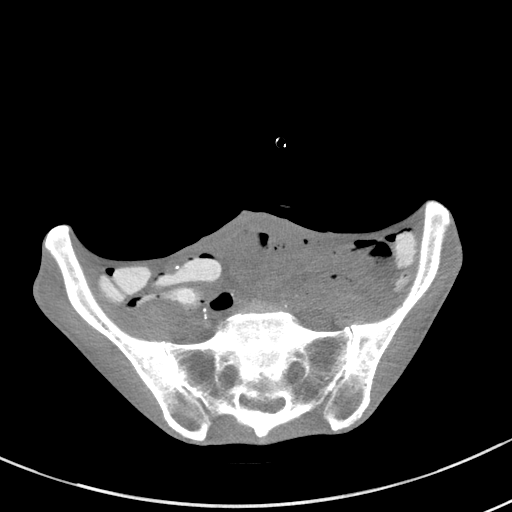
[im 39/82  soft-tissue]
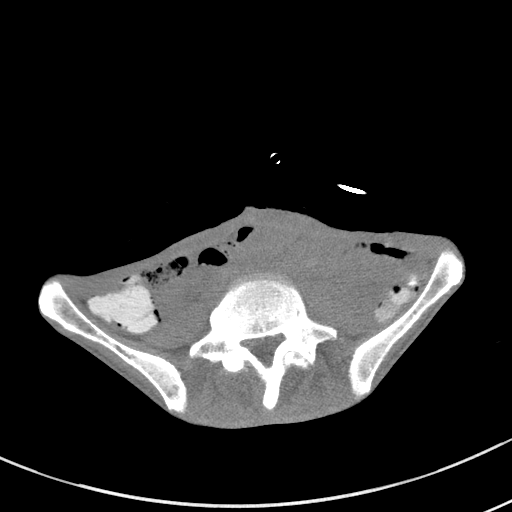
[im 43/82  soft-tissue]
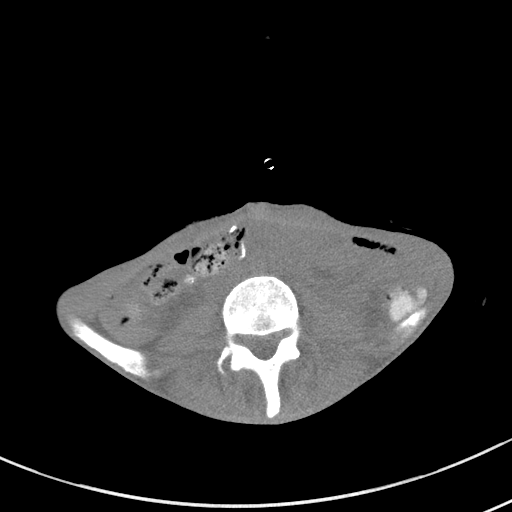
[im 51/82  soft-tissue]
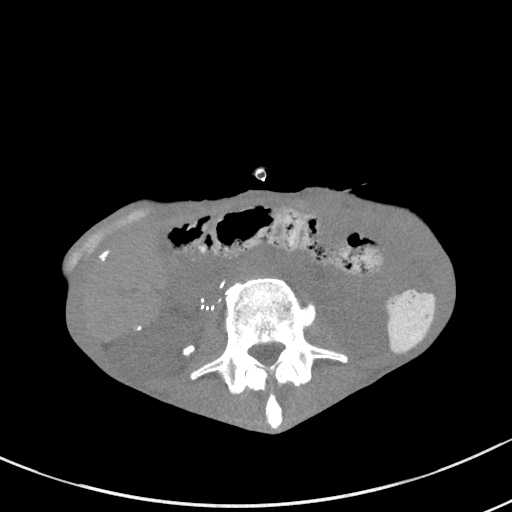
[im 58/82  soft-tissue]
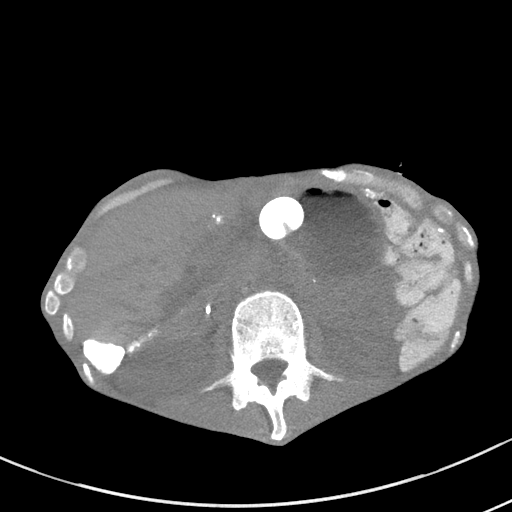
[im 58/82  bone]
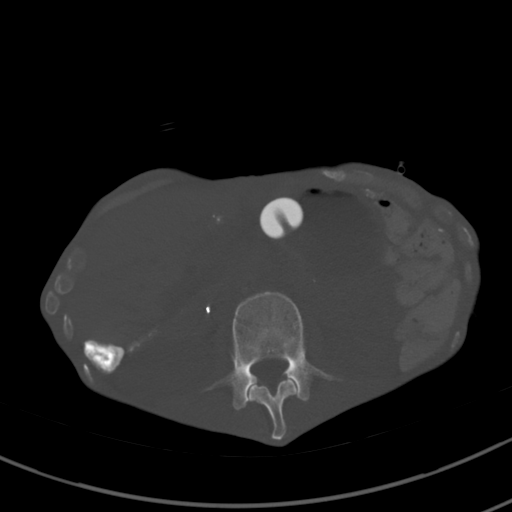
[im 62/82  soft-tissue]
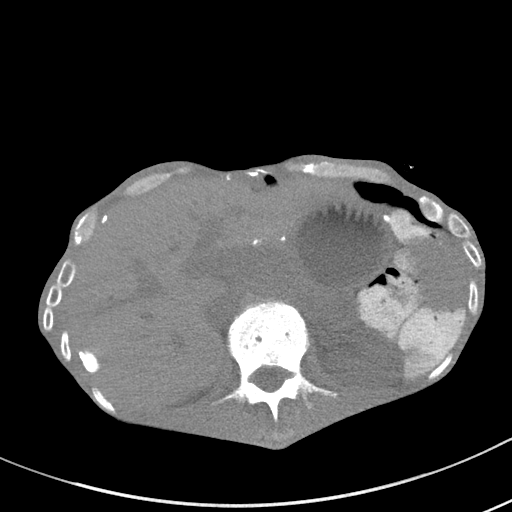
[im 70/82  soft-tissue]
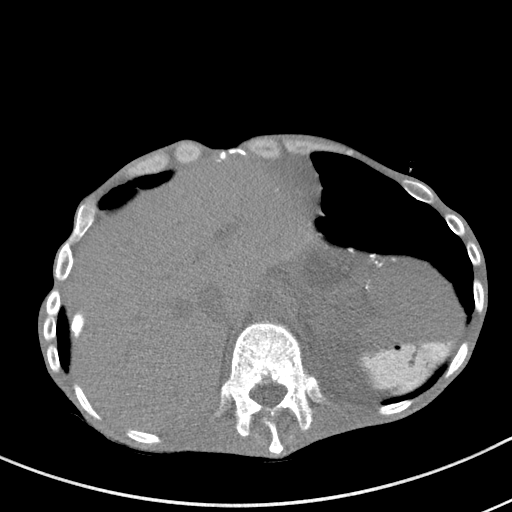
[im 78/82  soft-tissue]
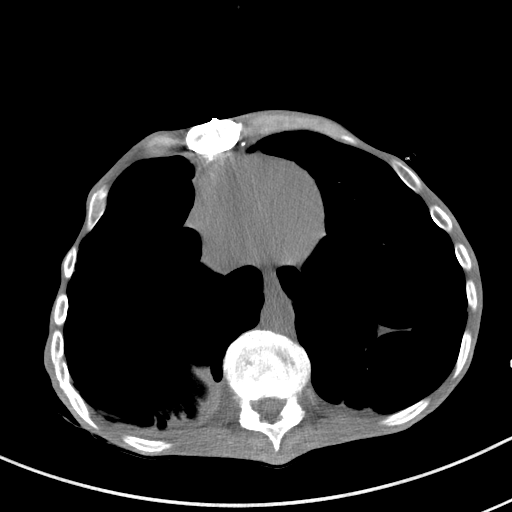

[Series 5: coronal st · coronal · 0.64mm/px · 3 of 66 slices shown]
[im 22/66  soft-tissue]
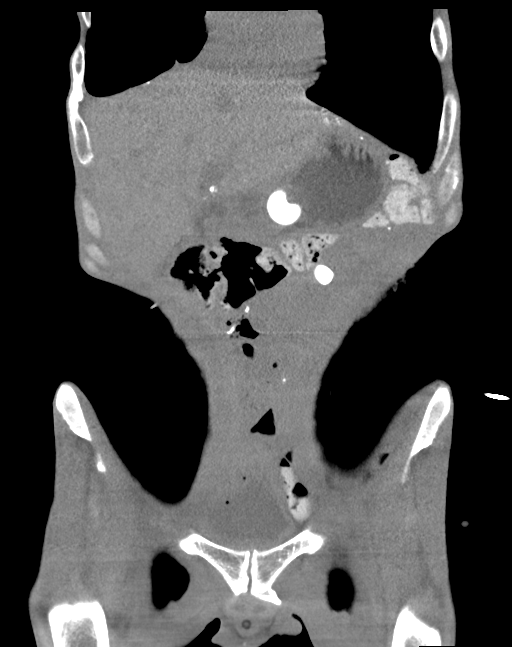
[im 29/66  soft-tissue]
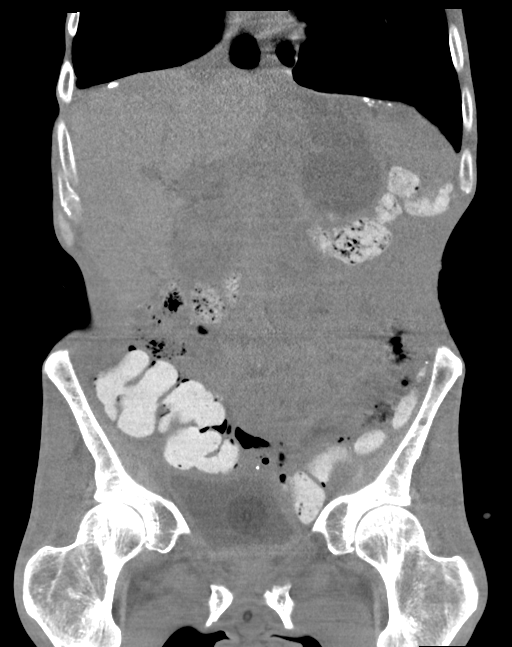
[im 37/66  soft-tissue]
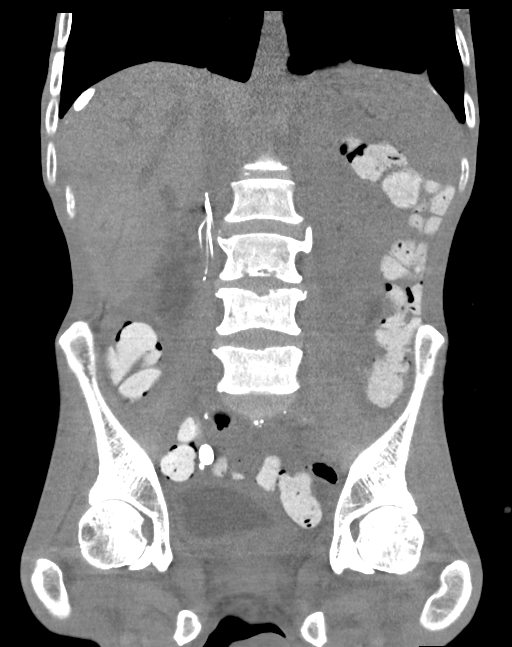

[15 of 46 positions shown; findings below may reference images not displayed]

FINDINGS: Evaluation of this exam is limited in the absence of intravenous
contrast as well as due to cachexia.

Lower chest: Bibasilar subpleural densities appears similar to prior
CT and may represent atelectasis or scarring. Trace chronic right
pleural effusion may be present. There is hypoattenuation of the
cardiac blood pool suggestive of anemia. Clinical correlation is
recommended.

Hepatobiliary: The liver is grossly unremarkable. No calcified
gallstone.

Pancreas: The pancreas is not well visualized.

Spleen: The spleen is poorly visualized and grossly unremarkable.

Adrenals/Urinary Tract: The adrenal glands are not well visualized.
There is a 1 cm stone in the region of the right renal hilum new
since the prior CT and may represent a stone in the right renal
pelvis. No stone identified in the left kidney. No significant
hydronephrosis. The urinary bladder is partially distended despite
presence of a Foley catheter. Several small pockets of air within
the bladder, likely introduced via catheter.

Stomach/Bowel: A percutaneous gastrostomy in noted with balloon in
the body of the stomach. Dense stool noted throughout the colon. No
bowel dilatation or evidence of obstruction. There is postsurgical
changes of the bowel with anastomotic suture in the anterior mid
abdomen. Several small pockets of air in the anterior mid abdomen
adjacent to the anastomotic suture noted (coronal series 5 images
21-26) favored to represent intraluminal air within the adjacent
anastomosed bowel, although extraluminal air suggestive of sutural
dehiscence is not excluded. The appearance of the suture however is
similar to prior CT and appears intact.

A small pocket of air in the midline upper abdomen ([DATE]) was
present on the prior CT.

Vascular/Lymphatic: An IVC filter is noted. Evaluation of the vessel
is nondiagnostic due to nonvisualization.

Reproductive: The prostate is not well visualized

Other: Severe loss of intra-abdominal and subcutaneous fat and
cachexia.

Musculoskeletal: Degenerative changes of the spine. No acute osseous
pathology.
IMPRESSION: 1. Very limited exam due to severe cachexia. No definite drainable
fluid collection or abscess identified in the anterior abdominal
wall or adjacent to the gastrostomy.
2. Small pockets of air adjacent to the anastomotic suture in the
anterior mid abdomen favored to represent intraluminal air within
the adjacent anastomosed bowel. However, extraluminal air suggestive
of sutural dehiscence is not excluded.
3. A 1 cm stone in the region of the right renal hilum new since the
prior CT and may represent a stone in the right renal pelvis. No
significant hydronephrosis.
4. Constipation.  No bowel obstruction.

## 2020-09-19 IMAGING — RF IR CV CATH INJECTION
2 series · 7 of 7 positions shown · non-contrast
Comparison: none

INDICATION: 49-year-old with history of esophageal atresia and chronic
indwelling percutaneous gastrostomy tube. Patient has had problems
with chronic leakage. The tube was recently upsized on 05/03/2020.
The patient was complaining because the tube was loose against the
skin.

[Series 3: cp_standard · 0.19mm/px · 4 of 50 frames shown (1 of 2)]
[frame 8/50]
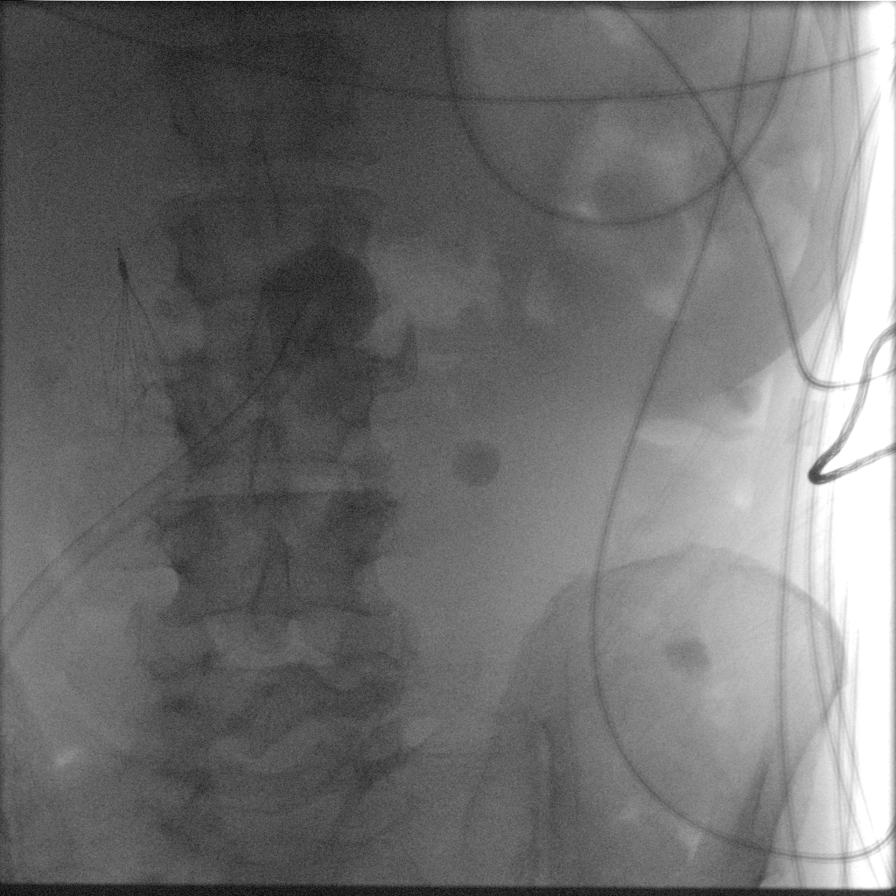
[frame 18/50]
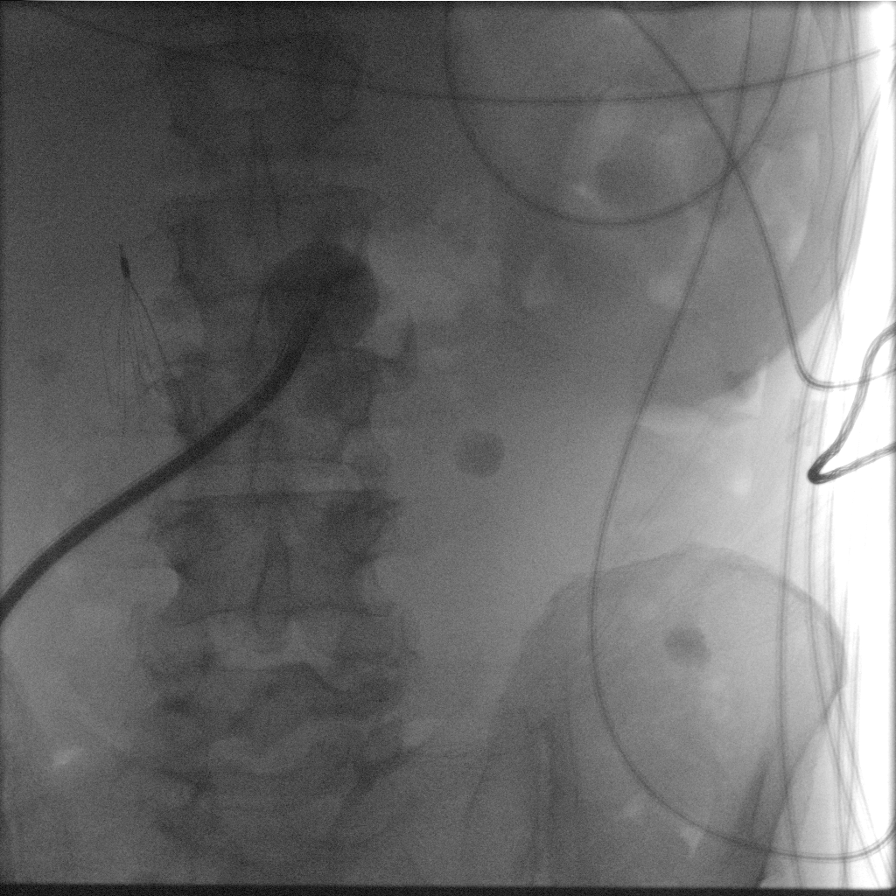
[frame 26/50]
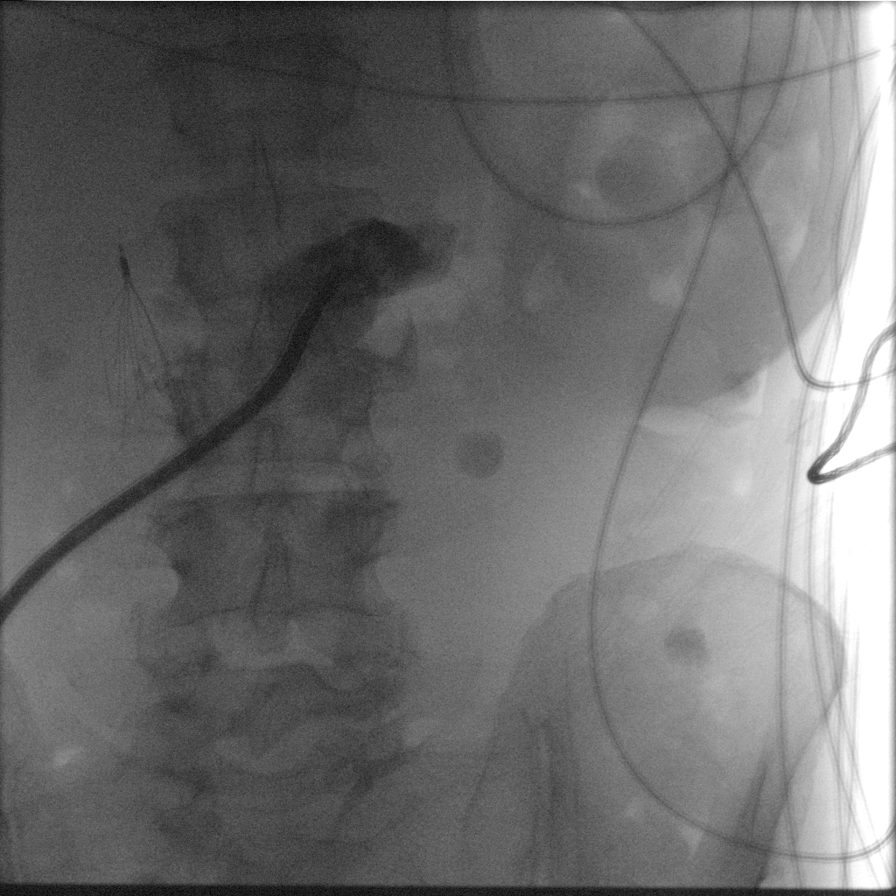
[frame 43/50]
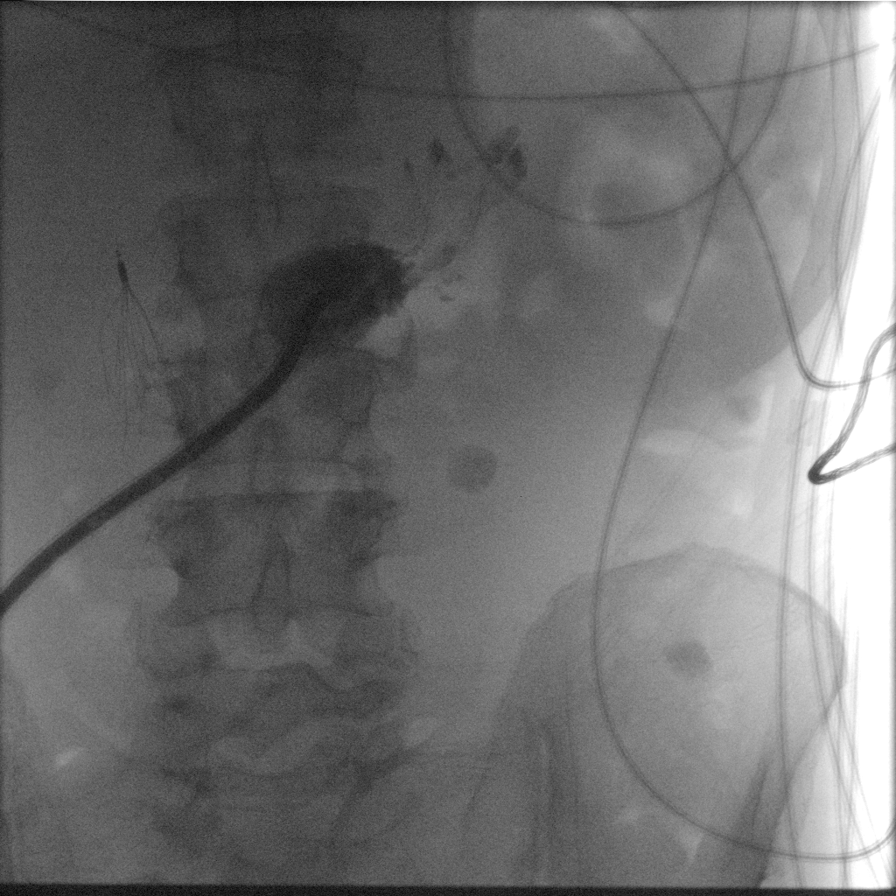

[Series 4: cp_standard · 0.19mm/px · 3 of 31 frames shown (2 of 2)]
[frame 5/31]
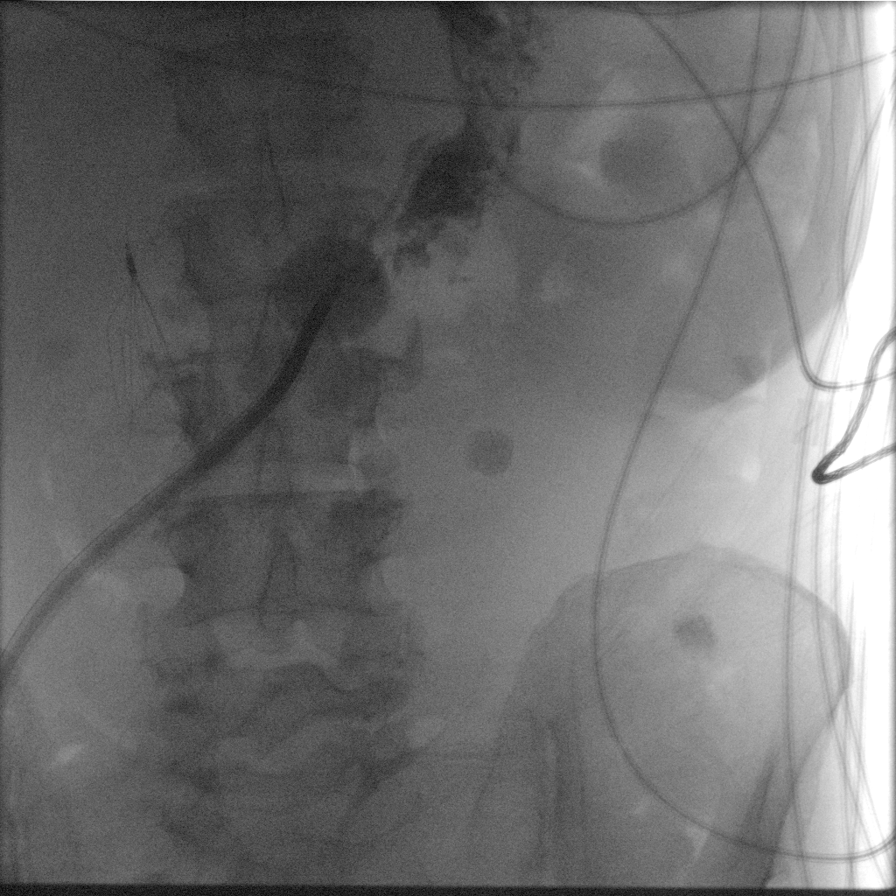
[frame 16/31]
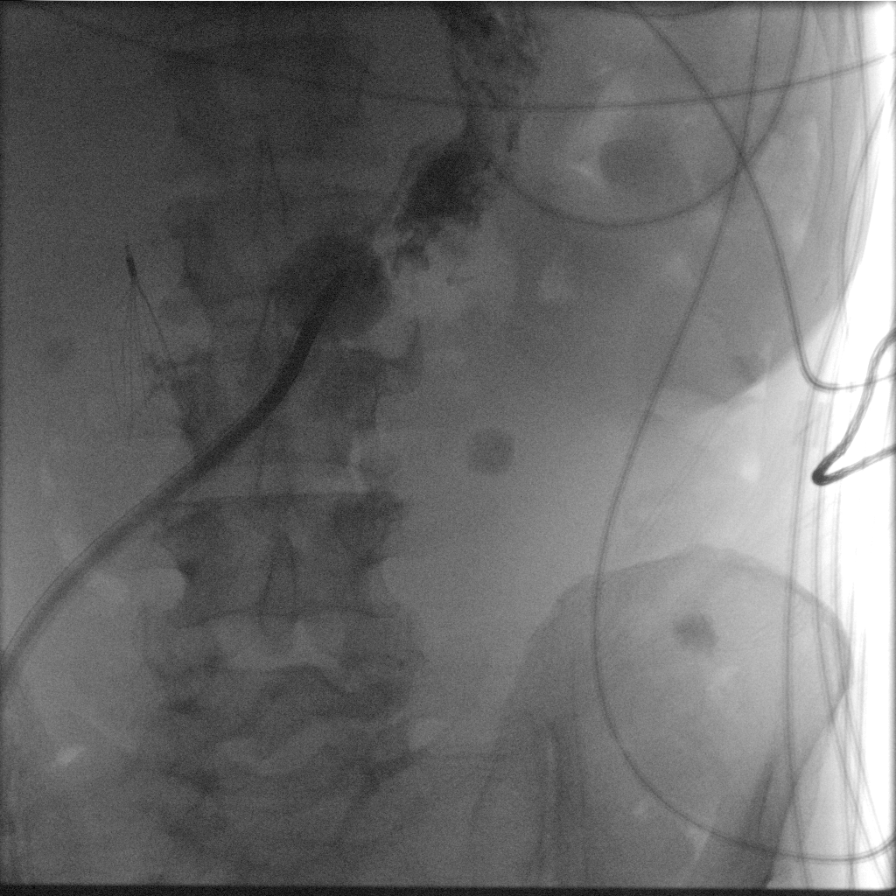
[frame 27/31]
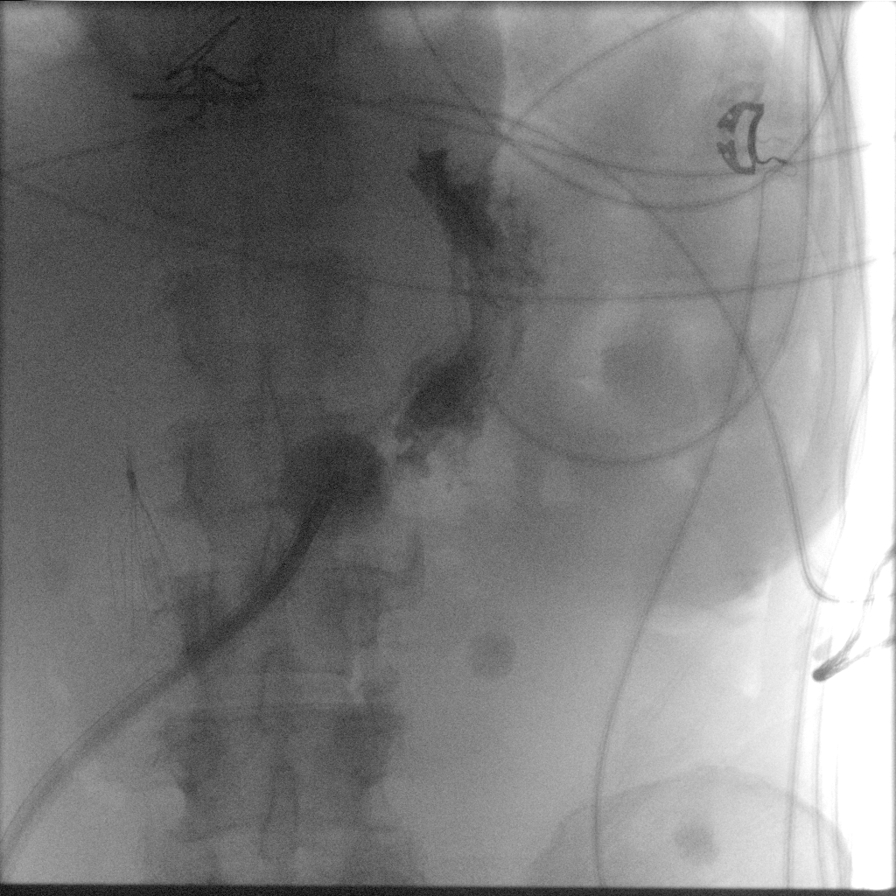

[7 of 7 positions shown; findings below may reference images not displayed]

EXAM:
GASTROSTOMY TUBE INJECTION WITH FLUOROSCOPY

MEDICATIONS:
None

ANESTHESIA/SEDATION:
None

CONTRAST:  10 mL Omnipaque 300-administered into the gastric lumen.

FLUOROSCOPY TIME:  Fluoroscopy Time: 6 seconds, 0.5 mGy

COMPLICATIONS:
None immediate.

PROCEDURE:
Tube dressing was removed. Tube was inspected. Gastrostomy tube
flange was secured to the skin by pulling up the gastrostomy tube.
The tube was injected with contrast and flushed with normal saline.
FINDINGS: Gastrostomy tube was intact. The flange was not against the skin. I
secured the gastrostomy tube flange to the skin. Showed the patient
how the flange was cinched to the skin. I placed a single layer of
gauze between the flange and the skin. Tube injection confirmed
placement in the stomach.
IMPRESSION: 1. Gastrostomy tube is well positioned in the stomach.
2. Gastrostomy tube flange was secured to the skin in order to
decrease the risk of leakage.

## 2020-12-09 ENCOUNTER — Emergency Department
Admission: EM | Admit: 2020-12-09 | Discharge: 2020-12-09 | Disposition: A | Discharge status: Home / Self Care | Payer: Medicaid Other | Attending: Emergency Medicine | Admitting: Emergency Medicine

## 2020-12-09 ENCOUNTER — Other Ambulatory Visit: Payer: Self-pay

## 2020-12-09 DIAGNOSIS — Z85038 Personal history of other malignant neoplasm of large intestine: Secondary | ICD-10-CM | POA: Insufficient documentation

## 2020-12-09 DIAGNOSIS — K9423 Gastrostomy malfunction: Secondary | ICD-10-CM | POA: Diagnosis not present

## 2020-12-09 DIAGNOSIS — Z87891 Personal history of nicotine dependence: Secondary | ICD-10-CM | POA: Insufficient documentation

## 2020-12-09 NOTE — ED Triage Notes (Addendum)
Pt comes into the ED via EMS from [facility] with c/o peg tube loose and leaking, VSS. Pt has an esophageal drainage bag with no issues

## 2020-12-09 NOTE — Discharge Instructions (Addendum)
You were seen today for leaking around your PEG tube.  The tube flushes and draws back well and is not clogged.  The balloon was slightly deflated, so this was re-inflated in the ER, which will help with the leaking.  There is also some widening of the tract from your abdominal wall to the stomach due to the tube being positioned and pulled sideways over a prolonged period of time.  This allows more leakage as well.  Positioning a towel or pillow next to the tube to keep it pointing straight outward from your abdomen will allow this to heal and for the tract to tighten up, which will also resolve the leakage.

## 2020-12-09 NOTE — ED Notes (Signed)
Patient unable to sign for self. [facility] aware of patient returning.

## 2020-12-09 NOTE — Progress Notes (Signed)
[facility] Room ED [address] [facility]) Hospital Liaison RN note:  Patient is under hospice services with [employer], recently being treated for abnormal weight loss and malnutrition. He has an out of facility DNR and resides at [facility]. I will update his hospice   team with notes and disposition.  Please call with any hospice related questions or concerns.   Thank you.   Cyndra Numbers, RN  [facility] Liaison  [phone]
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
## 2020-12-09 NOTE — ED Notes (Signed)
Spoke with Gala Murdoch, RN at [facility] and gave her report on patient.

## 2021-01-15 ENCOUNTER — Emergency Department: Payer: Medicaid Other

## 2021-01-15 ENCOUNTER — Emergency Department
Admission: EM | Admit: 2021-01-15 | Discharge: 2021-01-15 | Disposition: A | Discharge status: Home / Self Care | Payer: Medicaid Other | Attending: Emergency Medicine | Admitting: Emergency Medicine

## 2021-01-15 ENCOUNTER — Other Ambulatory Visit: Payer: Self-pay

## 2021-01-15 DIAGNOSIS — Z87891 Personal history of nicotine dependence: Secondary | ICD-10-CM | POA: Insufficient documentation

## 2021-01-15 DIAGNOSIS — K9423 Gastrostomy malfunction: Secondary | ICD-10-CM | POA: Insufficient documentation

## 2021-01-15 DIAGNOSIS — Z859 Personal history of malignant neoplasm, unspecified: Secondary | ICD-10-CM | POA: Diagnosis not present

## 2021-01-15 MED ORDER — DIATRIZOATE MEGLUMINE & SODIUM 66-10 % PO SOLN: 50.0000 mL | Freq: Once | ORAL | Status: DC

## 2021-01-15 NOTE — ED Triage Notes (Signed)
Pt comes from [facility] for peg tube replacement. It came out yesterday. Staff unable to get it back in.
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
## 2021-01-25 ENCOUNTER — Other Ambulatory Visit: Payer: Self-pay

## 2021-01-25 ENCOUNTER — Emergency Department: Payer: Medicaid Other

## 2021-01-25 ENCOUNTER — Encounter: Payer: Self-pay | Admitting: Radiology

## 2021-01-25 ENCOUNTER — Emergency Department
Admission: EM | Admit: 2021-01-25 | Discharge: 2021-01-26 | Disposition: A | Discharge status: Home / Self Care | Payer: Medicaid Other | Attending: Emergency Medicine | Admitting: Emergency Medicine

## 2021-01-25 DIAGNOSIS — Z87891 Personal history of nicotine dependence: Secondary | ICD-10-CM | POA: Insufficient documentation

## 2021-01-25 DIAGNOSIS — Z79899 Other long term (current) drug therapy: Secondary | ICD-10-CM | POA: Insufficient documentation

## 2021-01-25 DIAGNOSIS — K9423 Gastrostomy malfunction: Secondary | ICD-10-CM | POA: Diagnosis present

## 2021-01-25 DIAGNOSIS — Z859 Personal history of malignant neoplasm, unspecified: Secondary | ICD-10-CM | POA: Insufficient documentation

## 2021-01-25 NOTE — ED Triage Notes (Signed)
Pt presents to the [facility] via EMS from [facility] with c/o g-tube leaking. Pt states that staff attempted to administer morning medications when the tube began leaking around insertion site.

## 2021-01-25 NOTE — Discharge Instructions (Signed)
Your xray showed normal position and function of your PEG tube.  Continue to keep the skin site clean and dry and keep it cushioned with some gauze under the plastic bumper that holds it tight against the skin.  Follow up with your surgery team for further assessment.

## 2021-01-25 NOTE — ED Notes (Signed)
[facility] to transport back to facility.
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
## 2021-01-26 ENCOUNTER — Emergency Department: Payer: Medicaid Other

## 2021-01-26 ENCOUNTER — Other Ambulatory Visit: Payer: Self-pay

## 2021-01-26 ENCOUNTER — Emergency Department
Admission: EM | Admit: 2021-01-26 | Discharge: 2021-01-26 | Disposition: A | Discharge status: Home / Self Care | Payer: Medicaid Other | Source: Home / Self Care | Attending: Emergency Medicine | Admitting: Emergency Medicine

## 2021-01-26 ENCOUNTER — Encounter: Payer: Self-pay | Admitting: Emergency Medicine

## 2021-01-26 DIAGNOSIS — K9423 Gastrostomy malfunction: Secondary | ICD-10-CM | POA: Insufficient documentation

## 2021-01-26 DIAGNOSIS — K5901 Slow transit constipation: Secondary | ICD-10-CM

## 2021-01-26 DIAGNOSIS — Z87891 Personal history of nicotine dependence: Secondary | ICD-10-CM | POA: Insufficient documentation

## 2021-01-26 DIAGNOSIS — Z859 Personal history of malignant neoplasm, unspecified: Secondary | ICD-10-CM | POA: Insufficient documentation

## 2021-01-26 MED ORDER — LACTULOSE 20 GM/30ML PO SOLN: 30.0000 mL | Freq: Two times a day (BID) | ORAL | 0 refills | Status: AC

## 2021-01-26 MED ORDER — LACTULOSE 10 GM/15ML PO SOLN
30.0000 g | Freq: Once | ORAL | Status: AC
  Administered 2021-01-26: 30 g
  Filled 2021-01-26: qty 60

## 2021-01-26 MED ORDER — SORBITOL 70 % SOLN
960.0000 mL | TOPICAL_OIL | Freq: Once | ORAL | Status: AC
  Administered 2021-01-26: 960 mL via RECTAL
  Filled 2021-01-26: qty 473

## 2021-01-26 NOTE — ED Notes (Signed)
Pt disimpacted by EDP. Enema given after. Large amounts of hard brown balls of stool removed. Pt able to push out further brown balls of stool.

## 2021-01-26 NOTE — ED Notes (Signed)
X-ray at bedside

## 2021-01-26 NOTE — ED Notes (Signed)
Mother at bedside. Mother stated "the facility and the hospice nurse said he was going to be admitted to the surgery until his surgery on the 23rd" informed mother that pt is being evaluated in ER only at this time. Informed that tube was replaced and waiting on xray results before flushing new tube.

## 2021-01-26 NOTE — ED Notes (Signed)
Attempted to contact [employer] for report. No answer.

## 2021-01-26 NOTE — ED Triage Notes (Signed)
Pt arrived via EMS from [facility] for the second time in 12 hours due to facility staff unable to use G-Tube for nutrition and medication needs. Per staff, when using tube for meds or nutrition, the fluid bubbles back out of tube and pt unable to receive.

## 2021-01-26 NOTE — ED Notes (Signed)
Pt informed of DC and waiting on EMS transport back to [facility].

## 2021-01-26 NOTE — ED Notes (Signed)
Pt Enema completed with disimpaction by MD. Large amount of stool removed. Pt cleaned and repositioned back to a comfort position. Pts mother updated at bedside.

## 2021-01-26 NOTE — Discharge Instructions (Addendum)
G-tube was replaced. The tube is leaking because the patient is extremely constipation with stool from the rectum to the stomach. He received an enema here but will need a intense bowel regimen to help stool move and prevent malfunctioning of the G-tube. Therefore he is being discharge on lactulose BID for 7 days.

## 2021-01-26 NOTE — ED Triage Notes (Addendum)
Pt arrives [facility] from [facility]. States that G tube is clogged and leaking. Facility states "it's bubbling" pt has a colostomy bag on chest that drains from esophagus. G tube noted in abd, no bandage around site. Some redness noted around site. Pt was seen earlier tonight for same and pt states "they didn't do anything"
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
## 2021-01-26 NOTE — ED Notes (Signed)
Pt given two warm blankets, two cups of ice, and two cups of water.

## 2021-01-26 NOTE — ED Notes (Addendum)
Pt provided water and ice x 2 to swallow and have come out esophageal bag.   Dr. Alfred Levins at bedside to attempt to change out PEG tube with another 22. Only 33ml was removed from balloon, supposed to be 13ml. New 22 tube placed, 20 ml instilled into balloon, waiting on xray to confirm placement before flushing. Towels placed around site at this time. Pt provided phone to call mother.

## 2021-01-26 NOTE — ED Notes (Signed)
Called pharmacy to get smog enema sent to ER

## 2021-03-07 DEATH — deceased

## 2021-04-12 IMAGING — DX DG ABDOMEN 1V
1 series · 1 of 1 positions shown · non-contrast
Comparison: April 21, 2020

CLINICAL DATA: G-tube

EXAM:
ABDOMEN - 1 VIEW

[abdomen supine]
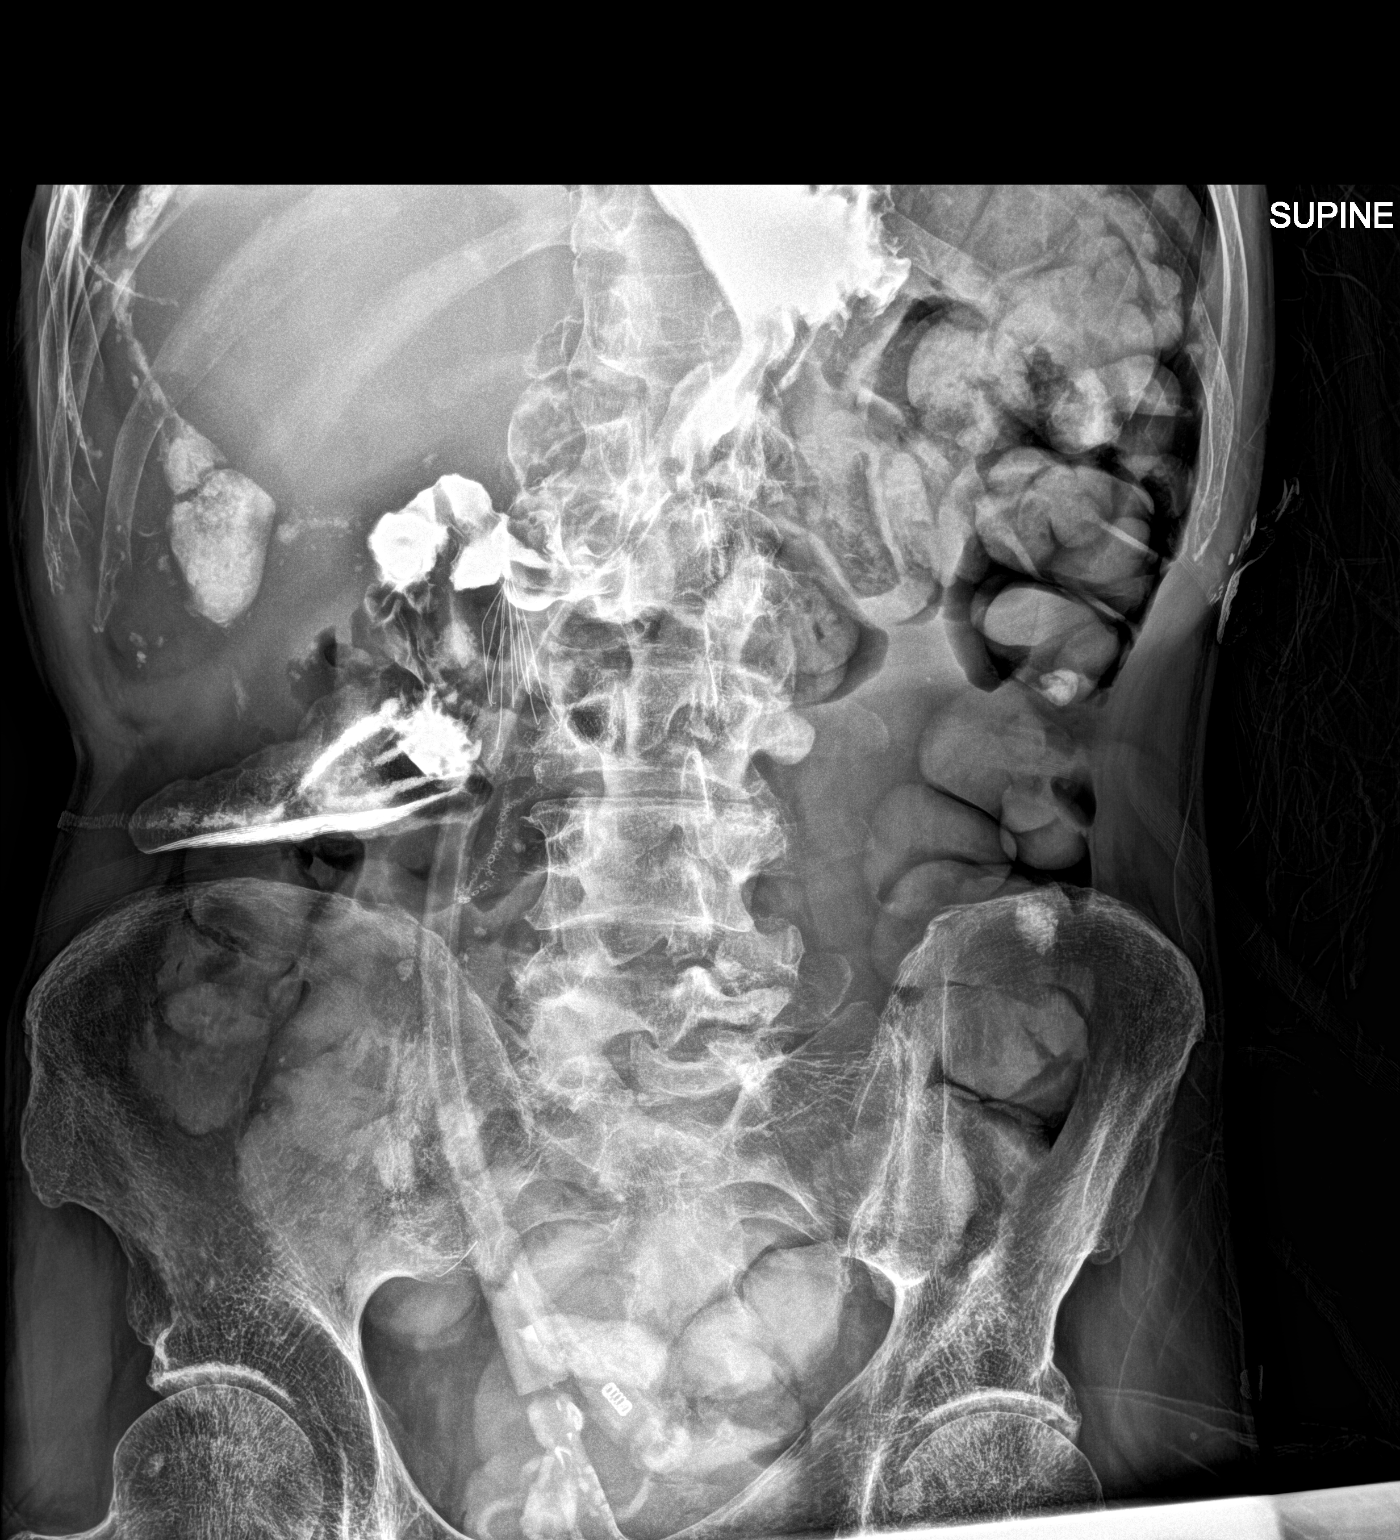

[1 of 1 positions shown; findings below may reference images not displayed]

FINDINGS: Gastrotomy tube is seen over the mid body of the stomach. There is
contrast seen within the stomach proximal portion of the duodenum
and refluxed into the distal esophagus. Again noted is left-sided
peritoneal calcifications. No dilated loops of bowel are noted.
There is small amount of contrast seen within the sigmoid rectal
junction.
IMPRESSION: Gastrotomy tube over the mid body of the stomach with contrast seen
throughout the stomach proximal duodenum and distal esophagus.

## 2021-04-22 IMAGING — DX DG ABDOMEN 1V
1 series · 1 of 1 positions shown · non-contrast
Comparison: Abdominal radiograph dated 01/15/2021.

CLINICAL DATA: 49-year-old male with leak around the PEG tube.

EXAM:
ABDOMEN - 1 VIEW

[abdomen supine]
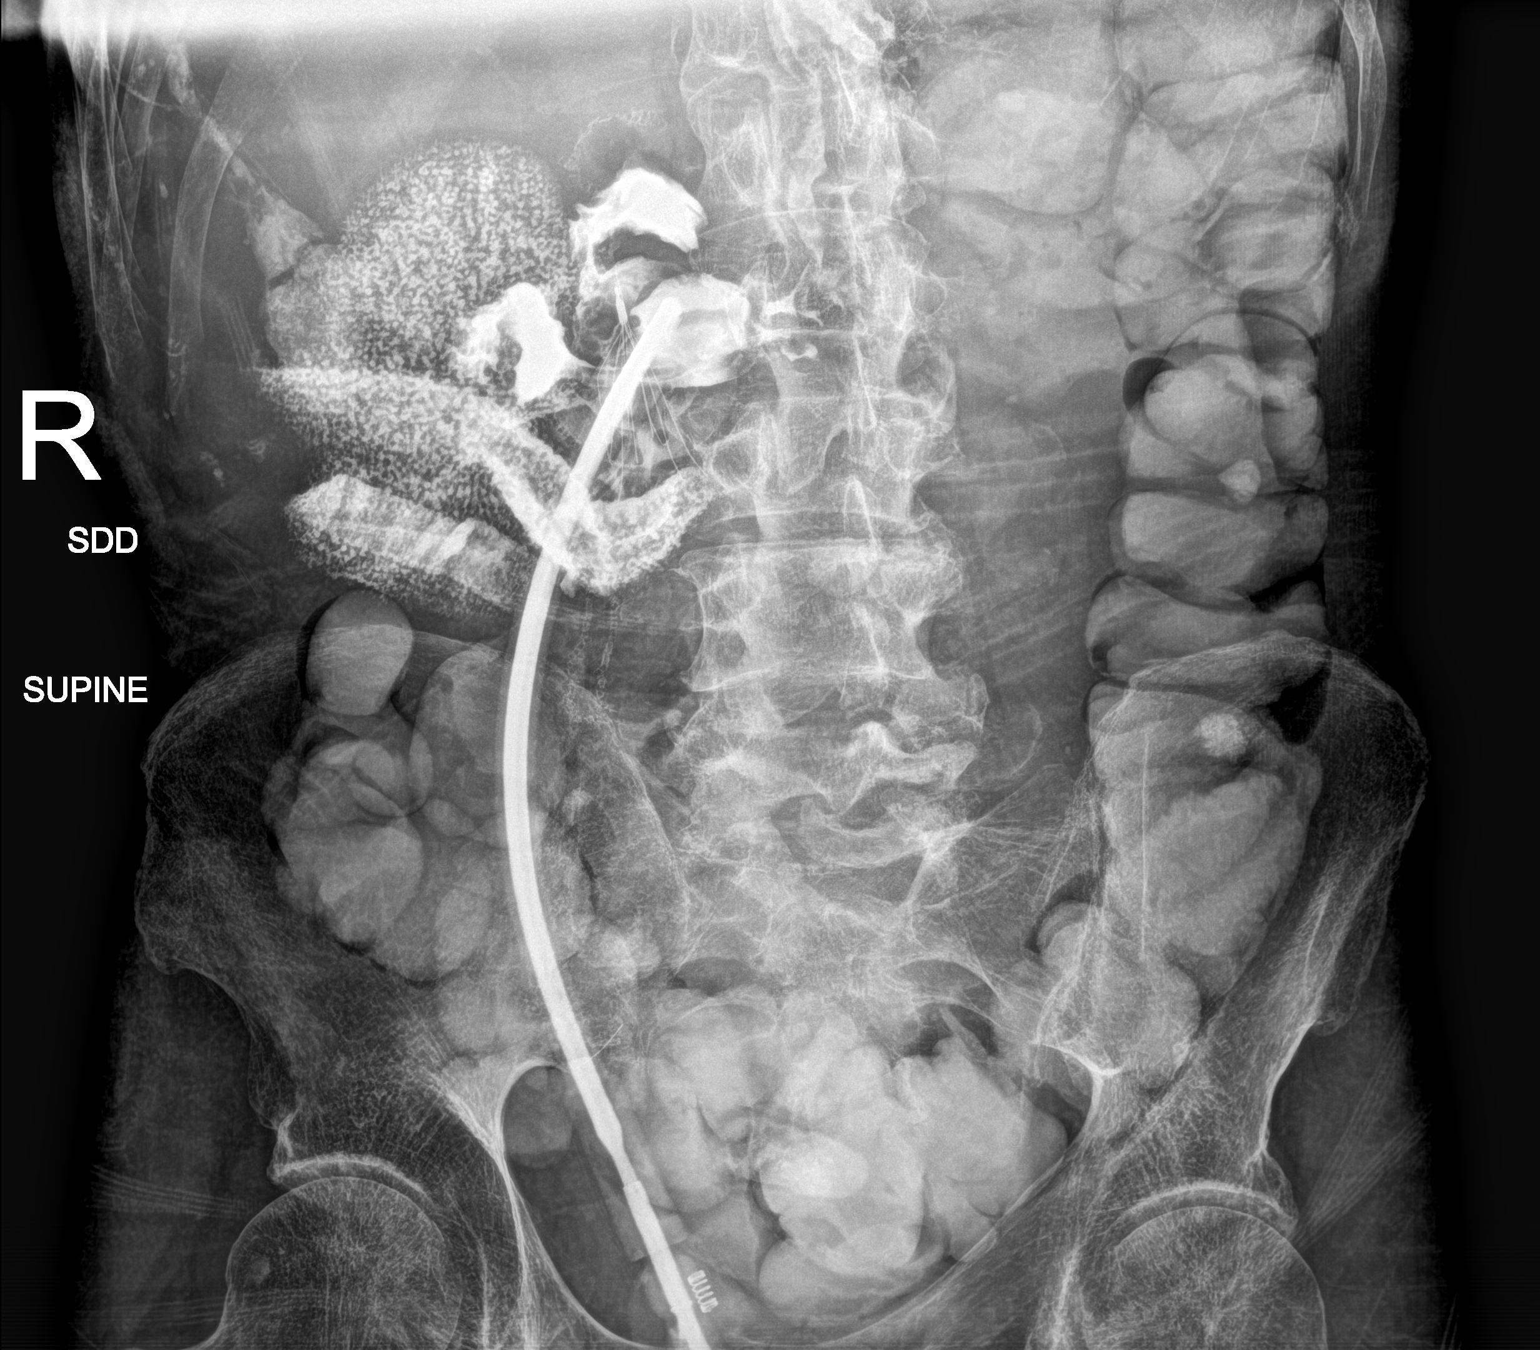

[1 of 1 positions shown; findings below may reference images not displayed]

FINDINGS: Percutaneous gastrostomy in similar position. Injected contrast
opacifies distal stomach and proximal duodenum. Evaluation for leak
is very limited due to overlying high attenuating material. No bowel
dilatation. Large amount of dense stool noted throughout the colon
similar to prior radiograph. Multiple surgical sutures noted in the
right hemiabdomen. An IVC filter is seen in similar position.
IMPRESSION: Percutaneous gastrostomy in similar position.

## 2021-04-23 IMAGING — DX DG ABDOMEN 1V
1 series · 1 of 1 positions shown · non-contrast
Comparison: Yesterday

CLINICAL DATA: G-tube confirmation

EXAM:
ABDOMEN - 1 VIEW

[abdomen supine]
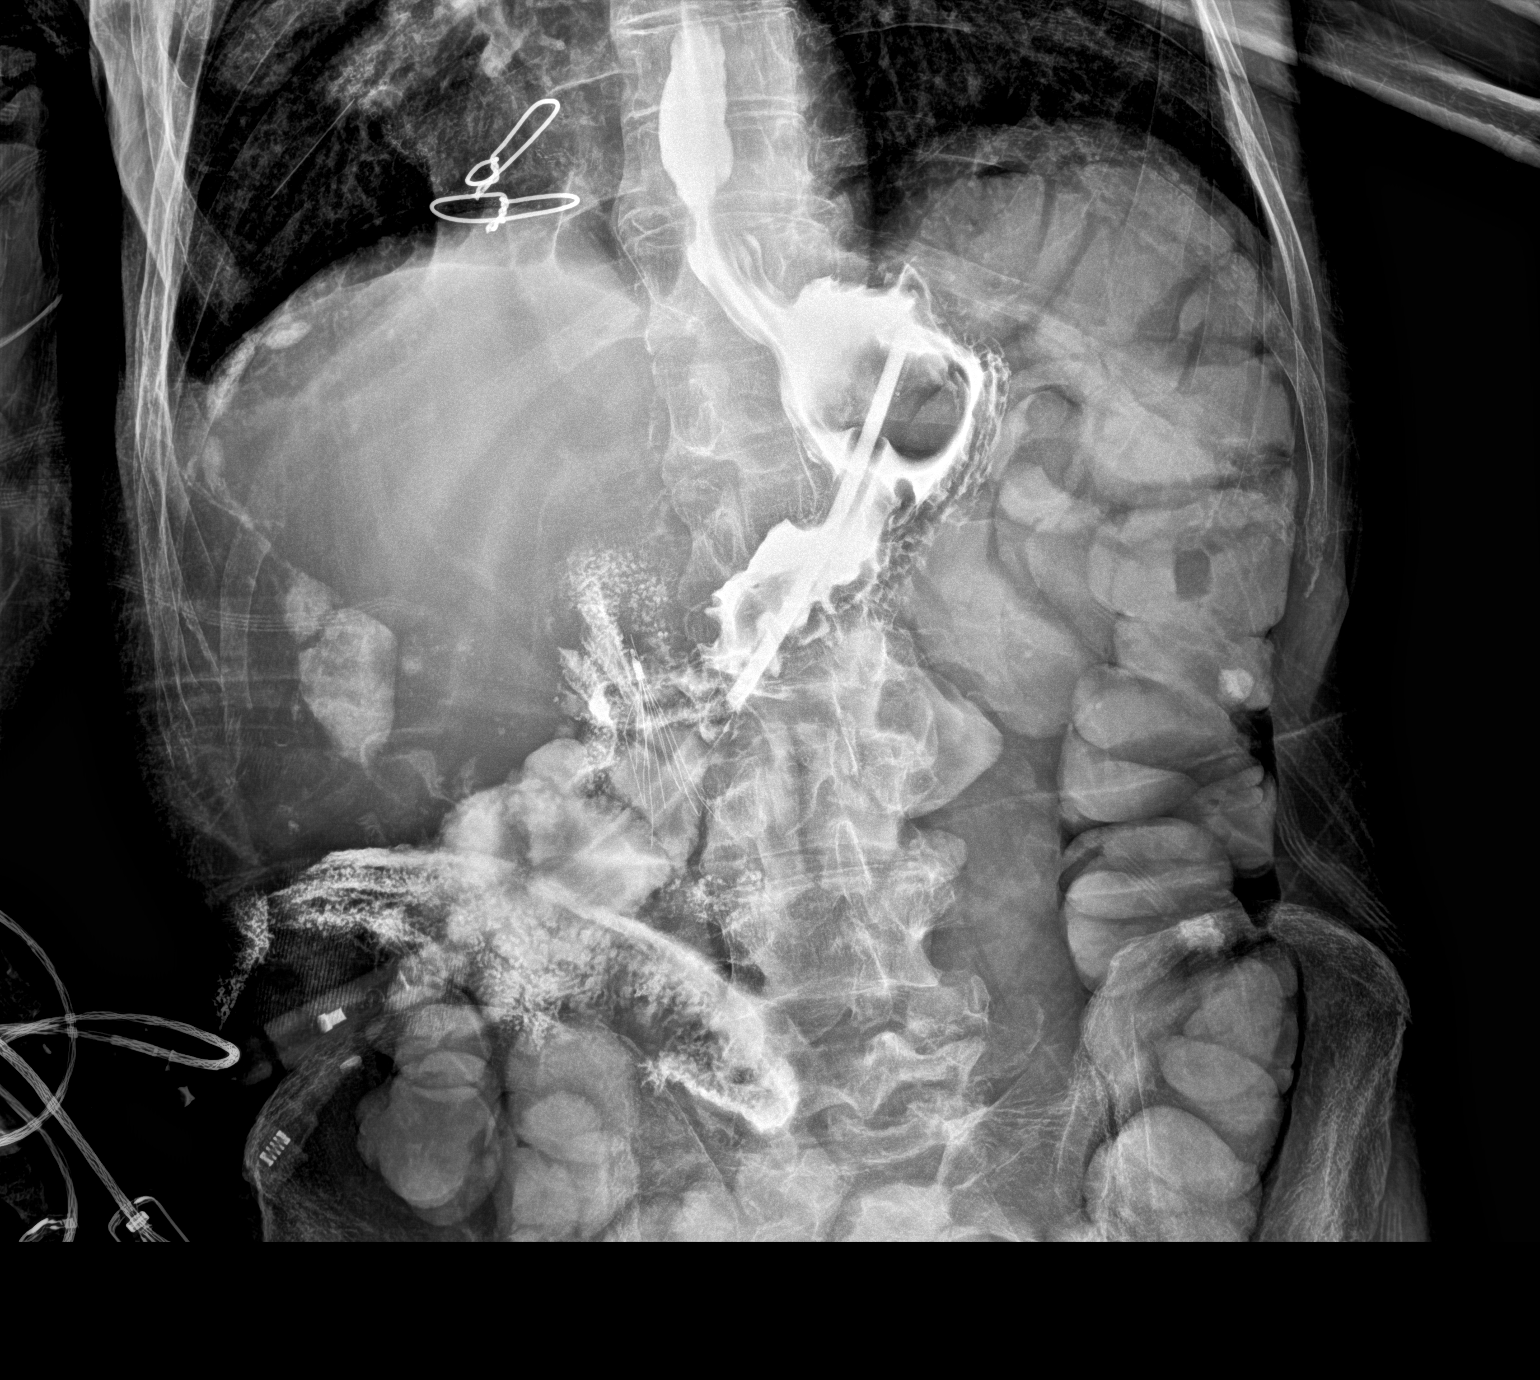

[1 of 1 positions shown; findings below may reference images not displayed]

FINDINGS: Advanced G tube with inflated balloon, tip at the upper stomach.
Contrast injection opacifies the collapsed stomach and refluxes into
the lower esophagus. High-density over the right upper quadrant is
peritoneal calcification. There is high-density within the colon
diffusely which is attributed to prior contrast injections. The
stool appears desiccated. High-density material over the right
abdomen has the appearance of overlapping contrast soiled towel. No
concerning intra-abdominal mass effect. Clear lung bases.
IMPRESSION: 1. Gastrostomy tube with tip and balloon at the proximal stomach.
2. Constipated appearance.
